# Patient Record
Sex: Female | Born: 1935 | Race: White | Hispanic: No | Smoking: Never smoker
Health system: Southern US, Community
[De-identification: ages and names within clinical notes are randomized; demographics above are authoritative.]

## PROBLEM LIST (undated history)

## (undated) DIAGNOSIS — I10 Essential (primary) hypertension: Secondary | ICD-10-CM

## (undated) DIAGNOSIS — E785 Hyperlipidemia, unspecified: Secondary | ICD-10-CM

## (undated) DIAGNOSIS — I2510 Atherosclerotic heart disease of native coronary artery without angina pectoris: Secondary | ICD-10-CM

## (undated) DIAGNOSIS — E119 Type 2 diabetes mellitus without complications: Secondary | ICD-10-CM

## (undated) DIAGNOSIS — E039 Hypothyroidism, unspecified: Secondary | ICD-10-CM

## (undated) DIAGNOSIS — Z853 Personal history of malignant neoplasm of breast: Secondary | ICD-10-CM

## (undated) DIAGNOSIS — L0390 Cellulitis, unspecified: Secondary | ICD-10-CM

## (undated) DIAGNOSIS — C55 Malignant neoplasm of uterus, part unspecified: Secondary | ICD-10-CM

## (undated) HISTORY — DX: Malignant neoplasm of uterus, part unspecified: C55

## (undated) HISTORY — DX: Essential (primary) hypertension: I10

## (undated) HISTORY — DX: Hyperlipidemia, unspecified: E78.5

## (undated) HISTORY — PX: APPENDECTOMY: SHX54

## (undated) HISTORY — DX: Atherosclerotic heart disease of native coronary artery without angina pectoris: I25.10

## (undated) HISTORY — DX: Cellulitis, unspecified: L03.90

## (undated) HISTORY — PX: CARDIAC CATHETERIZATION: SHX172

## (undated) HISTORY — DX: Personal history of malignant neoplasm of breast: Z85.3

---

## 1977-09-27 DIAGNOSIS — Z853 Personal history of malignant neoplasm of breast: Secondary | ICD-10-CM

## 1977-09-27 HISTORY — PX: MASTECTOMY: SHX3

## 1977-09-27 HISTORY — DX: Personal history of malignant neoplasm of breast: Z85.3

## 1989-09-27 DIAGNOSIS — C55 Malignant neoplasm of uterus, part unspecified: Secondary | ICD-10-CM

## 1989-09-27 HISTORY — DX: Malignant neoplasm of uterus, part unspecified: C55

## 1991-09-28 HISTORY — PX: TOTAL ABDOMINAL HYSTERECTOMY: SHX209

## 2006-09-27 DIAGNOSIS — I2510 Atherosclerotic heart disease of native coronary artery without angina pectoris: Secondary | ICD-10-CM

## 2006-09-27 HISTORY — PX: CORONARY ARTERY BYPASS GRAFT: SHX141

## 2006-09-27 HISTORY — DX: Atherosclerotic heart disease of native coronary artery without angina pectoris: I25.10

## 2006-12-08 ENCOUNTER — Encounter: Payer: Self-pay | Admitting: Emergency Medicine

## 2006-12-09 ENCOUNTER — Encounter (INDEPENDENT_AMBULATORY_CARE_PROVIDER_SITE_OTHER): Payer: Self-pay | Admitting: *Deleted

## 2006-12-09 ENCOUNTER — Inpatient Hospital Stay (HOSPITAL_COMMUNITY): Admission: EM | Admit: 2006-12-09 | Discharge: 2006-12-27 | Payer: Self-pay | Admitting: Cardiology

## 2006-12-09 ENCOUNTER — Ambulatory Visit: Payer: Self-pay | Admitting: Cardiothoracic Surgery

## 2006-12-09 ENCOUNTER — Encounter: Payer: Self-pay | Admitting: Vascular Surgery

## 2006-12-09 ENCOUNTER — Encounter: Payer: Self-pay | Admitting: Cardiology

## 2006-12-09 ENCOUNTER — Ambulatory Visit: Payer: Self-pay | Admitting: Emergency Medicine

## 2006-12-10 ENCOUNTER — Ambulatory Visit: Payer: Self-pay | Admitting: Infectious Diseases

## 2007-01-17 ENCOUNTER — Ambulatory Visit: Payer: Self-pay | Admitting: Surgery

## 2007-01-19 ENCOUNTER — Encounter (HOSPITAL_COMMUNITY): Admission: RE | Admit: 2007-01-19 | Discharge: 2007-04-19 | Payer: Self-pay | Admitting: Cardiology

## 2007-01-24 ENCOUNTER — Ambulatory Visit: Payer: Self-pay | Admitting: Surgery

## 2007-04-20 ENCOUNTER — Encounter (HOSPITAL_COMMUNITY): Admission: RE | Admit: 2007-04-20 | Discharge: 2007-05-08 | Payer: Self-pay | Admitting: Cardiology

## 2007-05-09 ENCOUNTER — Encounter (HOSPITAL_COMMUNITY): Admission: RE | Admit: 2007-05-09 | Discharge: 2007-08-07 | Payer: Self-pay | Admitting: Cardiology

## 2007-08-28 ENCOUNTER — Encounter (HOSPITAL_COMMUNITY): Admission: RE | Admit: 2007-08-28 | Discharge: 2007-09-26 | Payer: Self-pay | Admitting: Cardiology

## 2008-05-02 ENCOUNTER — Inpatient Hospital Stay (HOSPITAL_COMMUNITY): Admission: EM | Admit: 2008-05-02 | Discharge: 2008-05-04 | Payer: Self-pay | Admitting: Emergency Medicine

## 2008-05-04 ENCOUNTER — Ambulatory Visit: Payer: Self-pay | Admitting: Internal Medicine

## 2008-05-04 ENCOUNTER — Encounter: Payer: Self-pay | Admitting: Internal Medicine

## 2008-05-04 LAB — HM COLONOSCOPY

## 2008-05-06 ENCOUNTER — Encounter: Payer: Self-pay | Admitting: Internal Medicine

## 2008-05-17 ENCOUNTER — Encounter: Payer: Self-pay | Admitting: Internal Medicine

## 2009-04-10 ENCOUNTER — Telehealth: Payer: Self-pay | Admitting: Internal Medicine

## 2009-05-28 HISTORY — PX: US ECHOCARDIOGRAPHY: HXRAD669

## 2009-07-28 HISTORY — PX: OTHER SURGICAL HISTORY: SHX169

## 2009-08-27 HISTORY — PX: TOTAL HIP ARTHROPLASTY: SHX124

## 2009-09-03 ENCOUNTER — Inpatient Hospital Stay (HOSPITAL_COMMUNITY): Admission: RE | Admit: 2009-09-03 | Discharge: 2009-09-08 | Payer: Self-pay | Admitting: Orthopedic Surgery

## 2010-09-17 ENCOUNTER — Ambulatory Visit: Payer: Self-pay | Admitting: Cardiology

## 2010-10-07 ENCOUNTER — Encounter: Payer: Self-pay | Admitting: Family Medicine

## 2010-10-18 ENCOUNTER — Encounter: Payer: Self-pay | Admitting: Family Medicine

## 2010-11-12 NOTE — Letter (Signed)
Summary: Historic Patient File  Historic Patient File   Imported By: Kassie Mends 11/03/2010 10:07:40  _____________________________________________________________________  External Attachment:    Type:   Image     Comment:   External Document

## 2010-12-05 ENCOUNTER — Encounter: Payer: Self-pay | Admitting: Family Medicine

## 2010-12-29 LAB — COMPREHENSIVE METABOLIC PANEL
ALT: 19 U/L (ref 0–35)
AST: 18 U/L (ref 0–37)
CO2: 22 mEq/L (ref 19–32)
Calcium: 9.5 mg/dL (ref 8.4–10.5)
Creatinine, Ser: 0.88 mg/dL (ref 0.4–1.2)
GFR calc Af Amer: >60 mL/min (ref >60)
GFR calc non Af Amer: >60 mL/min (ref >60)
Sodium: 136 mEq/L (ref 135–145)
Total Protein: 7.1 g/dL (ref 6.0–8.3)

## 2010-12-29 LAB — TYPE AND SCREEN: ABO/RH(D): O POS

## 2010-12-29 LAB — BASIC METABOLIC PANEL
BUN: 15 mg/dL (ref 6–23)
BUN: 25 mg/dL — ABNORMAL HIGH (ref 6–23)
CO2: 27 mEq/L (ref 19–32)
Calcium: 8.9 mg/dL (ref 8.4–10.5)
Chloride: 104 mEq/L (ref 96–112)
Chloride: 104 mEq/L (ref 96–112)
Creatinine, Ser: 0.91 mg/dL (ref 0.4–1.2)
GFR calc Af Amer: >60 mL/min (ref >60)
GFR calc non Af Amer: >60 mL/min (ref >60)
Glucose, Bld: 135 mg/dL — ABNORMAL HIGH (ref 70–99)
Potassium: 3.6 mEq/L (ref 3.5–5.1)
Potassium: 3.8 mEq/L (ref 3.5–5.1)
Sodium: 134 mEq/L — ABNORMAL LOW (ref 135–145)
Sodium: 136 mEq/L (ref 135–145)
Sodium: 136 mEq/L (ref 135–145)

## 2010-12-29 LAB — GLUCOSE, CAPILLARY
Glucose-Capillary: 107 mg/dL — ABNORMAL HIGH (ref 70–99)
Glucose-Capillary: 113 mg/dL — ABNORMAL HIGH (ref 70–99)
Glucose-Capillary: 119 mg/dL — ABNORMAL HIGH (ref 70–99)
Glucose-Capillary: 128 mg/dL — ABNORMAL HIGH (ref 70–99)
Glucose-Capillary: 130 mg/dL — ABNORMAL HIGH (ref 70–99)
Glucose-Capillary: 142 mg/dL — ABNORMAL HIGH (ref 70–99)
Glucose-Capillary: 142 mg/dL — ABNORMAL HIGH (ref 70–99)
Glucose-Capillary: 153 mg/dL — ABNORMAL HIGH (ref 70–99)
Glucose-Capillary: 154 mg/dL — ABNORMAL HIGH (ref 70–99)
Glucose-Capillary: 164 mg/dL — ABNORMAL HIGH (ref 70–99)
Glucose-Capillary: 170 mg/dL — ABNORMAL HIGH (ref 70–99)
Glucose-Capillary: 186 mg/dL — ABNORMAL HIGH (ref 70–99)
Glucose-Capillary: 239 mg/dL — ABNORMAL HIGH (ref 70–99)
Glucose-Capillary: 62 mg/dL — ABNORMAL LOW (ref 70–99)
Glucose-Capillary: 70 mg/dL (ref 70–99)
Glucose-Capillary: 80 mg/dL (ref 70–99)
Glucose-Capillary: 95 mg/dL (ref 70–99)

## 2010-12-29 LAB — URINE MICROSCOPIC-ADD ON

## 2010-12-29 LAB — PROTIME-INR
INR: 0.98 (ref 0.00–1.49)
INR: 1.62 — ABNORMAL HIGH (ref 0.00–1.49)
INR: 1.84 — ABNORMAL HIGH (ref 0.00–1.49)
Prothrombin Time: 12.9 seconds (ref 11.6–15.2)
Prothrombin Time: 16.9 seconds — ABNORMAL HIGH (ref 11.6–15.2)
Prothrombin Time: 19.1 seconds — ABNORMAL HIGH (ref 11.6–15.2)
Prothrombin Time: 22.2 seconds — ABNORMAL HIGH (ref 11.6–15.2)

## 2010-12-29 LAB — CBC
HCT: 23.6 % — ABNORMAL LOW (ref 36.0–46.0)
HCT: 26.9 % — ABNORMAL LOW (ref 36.0–46.0)
MCV: 92.4 fL (ref 78.0–100.0)
MCV: 92.6 fL (ref 78.0–100.0)
MCV: 93.4 fL (ref 78.0–100.0)
Platelets: 154 10*3/uL (ref 150–400)
Platelets: 187 10*3/uL (ref 150–400)
RBC: 2.52 MIL/uL — ABNORMAL LOW (ref 3.87–5.11)
RBC: 2.91 MIL/uL — ABNORMAL LOW (ref 3.87–5.11)
RBC: 3.65 MIL/uL — ABNORMAL LOW (ref 3.87–5.11)
RDW: 16.4 % — ABNORMAL HIGH (ref 11.5–15.5)
WBC: 6.5 10*3/uL (ref 4.0–10.5)
WBC: 6.9 10*3/uL (ref 4.0–10.5)
WBC: 7 10*3/uL (ref 4.0–10.5)

## 2010-12-29 LAB — URINALYSIS, ROUTINE W REFLEX MICROSCOPIC
Bilirubin Urine: NEGATIVE
Bilirubin Urine: NEGATIVE
Glucose, UA: NEGATIVE mg/dL
Glucose, UA: NEGATIVE mg/dL
Ketones, ur: NEGATIVE mg/dL
Protein, ur: NEGATIVE mg/dL
Urobilinogen, UA: 0.2 mg/dL (ref 0.0–1.0)
pH: 5 (ref 5.0–8.0)

## 2010-12-29 LAB — PREPARE RBC (CROSSMATCH)

## 2010-12-29 LAB — URINE CULTURE
Colony Count: NO GROWTH
Culture: NO GROWTH

## 2011-01-26 ENCOUNTER — Other Ambulatory Visit: Payer: Self-pay | Admitting: Cardiology

## 2011-01-26 ENCOUNTER — Encounter: Payer: Self-pay | Admitting: Family Medicine

## 2011-01-26 ENCOUNTER — Ambulatory Visit (INDEPENDENT_AMBULATORY_CARE_PROVIDER_SITE_OTHER): Payer: Medicare Other | Admitting: Family Medicine

## 2011-01-26 DIAGNOSIS — I2510 Atherosclerotic heart disease of native coronary artery without angina pectoris: Secondary | ICD-10-CM

## 2011-01-26 DIAGNOSIS — E7800 Pure hypercholesterolemia, unspecified: Secondary | ICD-10-CM

## 2011-01-26 DIAGNOSIS — I10 Essential (primary) hypertension: Secondary | ICD-10-CM

## 2011-01-26 DIAGNOSIS — E1149 Type 2 diabetes mellitus with other diabetic neurological complication: Secondary | ICD-10-CM | POA: Insufficient documentation

## 2011-01-26 DIAGNOSIS — C50919 Malignant neoplasm of unspecified site of unspecified female breast: Secondary | ICD-10-CM

## 2011-01-26 DIAGNOSIS — J309 Allergic rhinitis, unspecified: Secondary | ICD-10-CM

## 2011-01-26 DIAGNOSIS — E119 Type 2 diabetes mellitus without complications: Secondary | ICD-10-CM

## 2011-01-26 DIAGNOSIS — G629 Polyneuropathy, unspecified: Secondary | ICD-10-CM

## 2011-01-26 DIAGNOSIS — G609 Hereditary and idiopathic neuropathy, unspecified: Secondary | ICD-10-CM

## 2011-01-26 DIAGNOSIS — D509 Iron deficiency anemia, unspecified: Secondary | ICD-10-CM

## 2011-01-26 DIAGNOSIS — E039 Hypothyroidism, unspecified: Secondary | ICD-10-CM

## 2011-01-26 DIAGNOSIS — C55 Malignant neoplasm of uterus, part unspecified: Secondary | ICD-10-CM

## 2011-01-26 LAB — COMPREHENSIVE METABOLIC PANEL
ALT: 15 U/L (ref 0–35)
AST: 18 U/L (ref 0–37)
CO2: 27 mEq/L (ref 19–32)
Calcium: 9.1 mg/dL (ref 8.4–10.5)
Chloride: 106 mEq/L (ref 96–112)
GFR: 58.69 mL/min — ABNORMAL LOW (ref >60.00)
Sodium: 141 mEq/L (ref 135–145)
Total Bilirubin: 0.4 mg/dL (ref 0.3–1.2)
Total Protein: 6.1 g/dL (ref 6.0–8.3)

## 2011-01-26 LAB — CBC WITH DIFFERENTIAL/PLATELET
Basophils Absolute: 0.1 10*3/uL (ref 0.0–0.1)
Eosinophils Absolute: 0.7 10*3/uL (ref 0.0–0.7)
HCT: 30.5 % — ABNORMAL LOW (ref 36.0–46.0)
Lymphs Abs: 1.4 10*3/uL (ref 0.7–4.0)
MCHC: 34 g/dL (ref 30.0–36.0)
Monocytes Relative: 4.9 % (ref 3.0–12.0)
Platelets: 193 10*3/uL (ref 150.0–400.0)
RDW: 14.4 % (ref 11.5–14.6)

## 2011-01-26 LAB — LIPID PANEL
Total CHOL/HDL Ratio: 6
VLDL: 83 mg/dL — ABNORMAL HIGH (ref 0.0–40.0)

## 2011-01-26 LAB — HEMOGLOBIN A1C: Hgb A1c MFr Bld: 6.5 % (ref 4.6–6.5)

## 2011-01-26 LAB — VITAMIN B12: Vitamin B-12: 1517 pg/mL — ABNORMAL HIGH (ref 211–911)

## 2011-01-26 LAB — LDL CHOLESTEROL, DIRECT: Direct LDL: 143.6 mg/dL

## 2011-01-26 NOTE — Assessment & Plan Note (Signed)
On glyburide..due to for reeval. The patient is advised to follow a low fat, low cholesterol diet, attempt to lose weight and improve dietary compliance.

## 2011-01-26 NOTE — Assessment & Plan Note (Signed)
Well controlled. Continue current medication.  

## 2011-01-26 NOTE — Progress Notes (Signed)
Addended by: Liane Comber on: 01/26/2011 12:39 PM   Modules accepted: Orders

## 2011-01-26 NOTE — Progress Notes (Signed)
Subjective:    Patient ID: [name], female    DOB: [birth year], 77 y.o.   MRN: [id number]  HPI  77 year old female here to establish.  Iron Def. Anemia: 2 years ago diagnosed...been on iron since. No GI source on colonoscopy.  Diabetes:Dx 10 years ago. On glyburide. Checks blood sugars a few times a weeks. FBS: 105 A1C: not sure but well controlled per pt. Last check in 07/2011  High cholesterol:  On crestor one tab a week. Higher dose gave her SE.  CAD... S/P Quad bypass .Marland Kitchen..followed by Dr. Patty Sermons. 08/2010...sees him every six months.  S/P breast cancer 1979...right mastectomy and chemo  S/P uterine cancer 1993.Marland KitchenMarland KitchenTAH  Hypothroid...well controlled last check.  HTN: Not followed at home....white coat HTN per pt...usually better by time she leaves.  In last 5 months she has been having  B foot pain... using tramadol prn. Feels buring in feet, needles sticking pain.  Keeping her up at night.  Feels somewhat better when up and walking around.  No swelling , no redness. No injury. Has history plantar fasciitis, ingrown toenail...resolved  Has gained 10 lbs because she is unable to walk. Getting diabetic shoes next week.   Review of Systems  Constitutional: Negative for fever and fatigue.  HENT: Negative for ear pain.   Eyes: Negative for pain.  Respiratory: Negative for chest tightness and shortness of breath.   Cardiovascular: Negative for chest pain, palpitations and leg swelling.  Gastrointestinal: Negative for abdominal pain.  Genitourinary: Negative for dysuria.       Objective:   Physical Exam  Constitutional: Vital signs are normal. She appears well-developed and well-nourished. She is cooperative.  Non-toxic appearance. She does not appear ill. No distress.  HENT:  Head: Normocephalic.  Right Ear: Hearing, tympanic membrane, external ear and ear canal normal. Tympanic membrane is not erythematous, not retracted and not bulging.  Left Ear:  Hearing, tympanic membrane, external ear and ear canal normal. Tympanic membrane is not erythematous, not retracted and not bulging.  Nose: No mucosal edema or rhinorrhea. Right sinus exhibits no maxillary sinus tenderness and no frontal sinus tenderness. Left sinus exhibits no maxillary sinus tenderness and no frontal sinus tenderness.  Mouth/Throat: Uvula is midline, oropharynx is clear and moist and mucous membranes are normal.  Eyes: Conjunctivae, EOM and lids are normal. Pupils are equal, round, and reactive to light. No foreign bodies found.  Neck: Trachea normal and normal range of motion. Neck supple. Carotid bruit is not present. No mass and no thyromegaly present.  Cardiovascular: Normal rate, regular rhythm, S1 normal, S2 normal, normal heart sounds, intact distal pulses and normal pulses.  Exam reveals no gallop and no friction rub.   No murmur heard. Pulmonary/Chest: Effort normal and breath sounds normal. Not tachypneic. No respiratory distress. She has no decreased breath sounds. She has no wheezes. She has no rhonchi. She has no rales.  Abdominal: Soft. Normal appearance and bowel sounds are normal. There is no tenderness.  Musculoskeletal:       Thoracic back: Normal.       Lumbar back: Normal.  Neurological: She is alert. She has normal strength and normal reflexes. She displays no atrophy. No cranial nerve deficit or sensory deficit. She exhibits normal muscle tone. She displays a negative Romberg sign.       Slight decrease in monofilament testing in distal toes  Skin: Skin is warm, dry and intact. No rash noted.  Psychiatric: Her speech is normal and behavior is  normal. Judgment and thought content normal. Her mood appears not anxious. Cognition and memory are normal. She does not exhibit a depressed mood.          Assessment & Plan:

## 2011-01-26 NOTE — Telephone Encounter (Signed)
Fax received from pharmacy. Refill completed. Jodette Ondine Gemme RN  

## 2011-01-26 NOTE — Patient Instructions (Signed)
It was nice to meet you today. We will call you with the lab results.

## 2011-01-26 NOTE — Assessment & Plan Note (Signed)
New. Most consistent with DM neuropahty... No clear suggestion of spinal central source. Will eval B12 and labs. If nml labs...consider starting gabapentin..Discussed with the patient and all questioned fully answered regarding this med and SE. She is open to its use if needed.

## 2011-01-26 NOTE — Assessment & Plan Note (Signed)
Per pt well controlled last check in 07/2010

## 2011-01-26 NOTE — Assessment & Plan Note (Signed)
ON once a week crestor..due to for reeval.

## 2011-01-27 ENCOUNTER — Telehealth: Payer: Self-pay | Admitting: Family Medicine

## 2011-01-27 DIAGNOSIS — E7800 Pure hypercholesterolemia, unspecified: Secondary | ICD-10-CM

## 2011-01-27 NOTE — Telephone Encounter (Signed)
Notify pt labs reviewed.  DM is well controlled. Stable anemia.  Nml liver and kidney function. Nml electrolyte such as sodiuma nd potassium  Cholesterol remains poorly controlled on once weekly crestor... Increase to 3 times a week if able...will recheck labs in 3 months prior to next appt.  B12 in nml range... Diabetes is likely cause of her foot burning.  Is she open to starting gabapentin at bedtime for symptoms and titrating up? Let me know.

## 2011-02-01 ENCOUNTER — Encounter: Payer: Self-pay | Admitting: *Deleted

## 2011-02-01 NOTE — Telephone Encounter (Signed)
Letter mailed to patient with information and asked patient to call back about the new medicaton

## 2011-02-09 NOTE — H&P (Signed)
NAME:  [name], WARTH NO.:  [id number]   MEDICAL RECORD NO.:  [id number]          PATIENT TYPE:  EMS   LOCATION:  MAJO                         FACILITY:  [facility]   PHYSICIAN:  Eduard Clos, MDDATE OF BIRTH:  [birth year]   DATE OF ADMISSION:  05/01/2008  DATE OF DISCHARGE:                              HISTORY & PHYSICAL   CHIEF COMPLAINT:  The patient was found in altered mental status by her  family.   HISTORY OF PRESENT ILLNESS:  A 77 year old female with history of CAD  status post CABG, hypertension, diabetes mellitus type 2,  hypothyroidism, who was brought into the ER after the patient's family  found her to be in altered mental status with incoherence.  The EMS was  called and the patient's blood sugar was found to be around 39.  Subsequently D50 was given.  At this moment the patient's blood sugar is  136.  The patient is alert, awake, and oriented to time, place, and  person.  The patient recalled that she had sat on a chair and was  feeling abnormal.  She went to sleep after which she was woken up by her  family member.  She does not have any chest pain prior or post to the  incident.  She denies any weakness of limbs, nausea, vomiting, abdominal  pain, dysuria, or discharge.  The patient does have some chills and  temperature measured in the ER was 94 degrees and Bear hugger has been  ordered.   PAST MEDICAL HISTORY:  1. CAD status post CABG.  2. Diabetes mellitus type 2.  3. Hypothyroidism.  4. History of depression.   PAST SURGICAL HISTORY:  1. CABG last year.  2. Appendectomy.  3. History of mastectomy for breast cancer.  4. History of hysterectomy for uterine cancer.   ADMISSION MEDICATIONS:  1. Actos 45 mg p.o. daily.  2. Glyburide 5 mg two tablets daily.  3. Paroxetine 20 mg p.o. daily.  4. Toprol XL 25 mg twice daily.  5. Triamterene hydrochlorothiazide 37.5/25 mg p.o. daily.  6. Levothyroxine 0.05 mg p.o. daily.  7. Aspirin 81  mg p.o. daily.   ALLERGIES:  No known drug allergies.   FAMILY HISTORY:  Nothing contributory.   SOCIAL HISTORY:  The patient lives alone.  Denies smoking cigarettes,  drinking alcohol, or using any illegal drugs.   REVIEW OF SYSTEMS:  As in history of present illness.  Nothing else  significant.   PHYSICAL EXAMINATION:  GENERAL:  The patient examined at bedside, not in  acute distress.  VITAL SIGNS:  Blood pressure is 126/30, pulse 58 per minute, temperature  94 degrees Fahrenheit, respirations 20 per minute, O2 saturations 96%.  HEENT:  Anicteric, no pallor.  PERRLA positive.  CHEST:  Bilateral air entry present.  No rhonchi and no crepitation.  HEART:  S1 and S2 heard.  ABDOMEN:  Soft and nontender.  Bowel sounds heard.  No guarding and no  rigidity.  NEUROLOGY:  The patient is alert, awake, and oriented to time, place,  and person.  She moves upper and lower extremities 5/5.  EXTREMITIES:  Peripheral pulses felt.  No edema.   LABORATORY DATA:  CBC; WBC 7.9, hemoglobin 9.5, hematocrit 28, platelets  201, neutrophils 84%. Basic metabolic panel; sodium 139, potassium 3.4,  chloride 107, glucose 34, BUN 29, creatinine 1.2, CK-MB 1.2, troponin I  less than 0.05.  Urine is cloudy with positive nitrites and leukocytes.  WBC 3-6, bacteria many.   ASSESSMENT:  1. Altered mental status probably secondary to hypoglycemia.  2. Urinary tract infection.  3. Hypothermia.  4. History of coronary artery disease status post coronary artery      bypass graft.  5. History of hypothyroidism.  6. History of hypertension.   PLAN:  Admit the patient to telemetry.  We will follow CBGs closely.  Hold glyburide and Actos for now.  We will follow cardiac enzymes and  obtain blood cultures, urine cultures.  We will place the patient on  empiric antibiotics for possible UTI and further recommendations as the  patient's condition evolves.      Eduard Clos, MD  Electronically  Signed     ANK/MEDQ  D:  05/02/2008  T:  05/02/2008  Job:  [id number]

## 2011-02-09 NOTE — Discharge Summary (Signed)
NAMERAVIN, BENDALL NO.:  [id number]   MEDICAL RECORD NO.:  [id number]          PATIENT TYPE:  INP   LOCATION:  5508                         FACILITY:  [facility]   PHYSICIAN:  Elliot Cousin, M.D.    DATE OF BIRTH:  07/04/1933   DATE OF ADMISSION:  05/01/2008  DATE OF DISCHARGE:  05/04/2008                               DISCHARGE SUMMARY   <   DISCHARGE DIAGNOSES:  1. Altered mental status secondary to hypoglycemia.  2. Hypoglycemia secondary to glyburide, concomitant with Actos.  3. Iron-deficiency anemia; transfused 2 units of packed red blood      cells.  4. Colon polyp and sigmoid diverticulosis, per colonoscopy on May 04, 2008.  5. Erosive gastritis, per esophagogastroduodenoscopy on May 04, 2008.  6. Pyuria without dysuria.  7. Hypokalemia.  8. Hypothermia, possibly due to a technical error.   DISCHARGE MEDICATIONS:  1. Protonix 40 mg daily.  2. Ferrous sulfate 325 mg b.i.d.  3. Multivitamin once daily.  4. Do not take glyburide.  5. Actos 45 mg daily.  6. Triamterene/hydrochlorothiazide 37.5/25 mg daily.  7. Levothyroxine 0.05 mg daily.  8. Toprol-XL 25 mg b.i.d.  9. Aspirin 81 mg daily.  10.Niaspan 500 mg daily.  11.Paroxetine 20 mg daily.   DISCHARGE DISPOSITION:  The patient is being discharged home in improved  and stable condition.  She was advised to follow up with her primary  care physician, Dr. Smith Mince in 1-2 weeks and with gastroenterologist,  Dr. Leone Payor as needed.   CONSULTATIONS:  1. Barbette Hair. Arlyce Dice, MD, [facility]  2. Iva Boop, MD, [facility]   PROCEDURES PERFORMED:  1. Colonoscopy on May 04, 2008.  Performed by Dr. Stan Head.  The      results revealed one diminutive polyp, removed, mild sigmoid      diverticulosis.  2. Esophagogastroduodenoscopy on May 04, 2008, by Dr. Stan Head.      The results revealed erosive gastritis, distal stomach, tested for      H. pylori, 5-cm hiatal hernia, otherwise  normal      esophagogastroduodenoscopy.  3. Status post 2 units of packed red blood cell transfusion.  4. CT scan of the head on May 02, 2008.  The results revealed no      acute intracranial abnormality.   HISTORY OF PRESENT ILLNESS:  The patient is a 77 year old woman with a  past medical history significant for coronary artery disease, type 2  diabetes mellitus, and hypothyroidism.  She was brought to the emergency  department by her family when she was found to be incoherent and  lethargic at home.  When the EMS arrived , the patient's blood sugar was  found to be 39.  She was treated with D50 and subsequently brought to  the emergency department.  When the patient was evaluated in the  emergency department, her glucose was 34 after it had been 136 following  the dextrose given by EMS.  The patient was given more dextrose in the  emergency department.  A CT scan of the head  was ordered, and it  revealed no acute intracranial abnormalities.  Also, of note, the  patient's temperature was recorded at 94 degrees Fahrenheit in the  emergency department.  The patient was admitted for further evaluation  and management.  For additional details, please see the dictated history  and physical.   HOSPITAL COURSE:  1. Type 2 diabetes mellitus with symptomatic hypoglycemia.  As stated      above, the patient was given multiple doses of D50.  Her capillary      blood glucose was monitored frequently.  The following day, she was      restarted on IV fluids with D5 normal saline with potassium      chloride added.  The glyburide and Actos were withheld during the      hospitalization.  Over the course of the 2-day hospitalization, the      patient's blood glucose improved.  Prior to hospital discharge, her      blood glucose was 174.  The patient was advised to discontinue the      glyburide until she is reevaluated by her primary care physician in      1-2 weeks.  She was advised to restart  the Actos, but to not take      the Actos if her blood glucose was less than 110.  She was also      strongly advised not to skip meals.  Of note, her hemoglobin A1c      was 4.8 during the hospitalization.  2. Hypothermia.  As stated above, the patient's temperature was 94      degrees in the emergency department.  The patient was treated with      a [employer].  Although the patient did not appear to be septic,      blood cultures were ordered.  In addition, an urinalysis and chest      x-ray were ordered.  Her blood cultures have remained negative so      far.  Her urinalysis revealed positive nitrites and small      leukocytes.  She was therefore started on Cipro intravenously.  The      urine culture eventually grew out greater than 100,000 colonies of      E. coli.  The patient had no complaints of dysuria prior to this      hospitalization or during the hospitalization.  She was treated      however with 2-1/2 days of intravenous Cipro.  The patient remained      afebrile during the entire hospitalization.  Her white blood cell      count was within normal limits as well.  It is unclear if the      original temperature recorded was correct; it may have been a      technical error.  The patient's temperature remained well within      normal limits during the entire hospitalization.  3. Microcytic iron-deficiency anemia.  At the time of the initial      hospital assessment, the patient's hemoglobin was 8.8.  Following      gentle volume repletion, her hemoglobin fell to a nadir of 7.9.      She was transfused 2 units of packed red blood cells.  Following      the transfusion, her hemoglobin improved to 10.5.  Her stools were      tested for microscopic blood and were negative.  Because of the  severe anemia, gastroenterologist, Dr. Arlyce Dice was consulted for      further evaluation.  Dr. Arlyce Dice evaluated the patient along with      his PA, Ms. Sanjuana Letters.  He recommended further  investigation with an      EGD and colonoscopy.  Dr. Leone Payor performed the EGD and colonoscopy      today.  The results are above.  Given the findings of the      gastritis, the patient was started on Protonix once daily.  Upon      discharge, the patient was started on ferrous sulfate 325 mg b.i.d.   Prior to the transfusions, iron studies were ordered.  The results were  as follows:  Ferritin 24, vitamin B12 510, total iron 20, TIBC 375,  percent saturation 5, and folic acid 12.8.  The patient was advised to  avoid all NSAID products with the exception of aspirin 81 mg daily.  1. Hypothyroidism.  The patient was maintained on replacement therapy      with levothyroxine.  Her TSH was      within normal limits at 0.812.  2. Hypokalemia.  The patient's serum potassium fell to a nadir of 3.2.      She was repleted with potassium chloride during the      hospitalization.  Prior to hospital discharge, her serum potassium      normalized to 4.1.      Elliot Cousin, M.D.  Electronically Signed     DF/MEDQ  D:  05/04/2008  T:  05/05/2008  Job:  [id number]   cc:   Talmadge Coventry, M.D.  Iva Boop, MD,FACG

## 2011-02-11 ENCOUNTER — Other Ambulatory Visit: Payer: Self-pay | Admitting: Cardiology

## 2011-02-12 NOTE — H&P (Signed)
NAME:  AYRA, HODGDON NO.:  [id number]   MEDICAL RECORD NO.:  [id number]          PATIENT TYPE:  EMS   LOCATION:  ED                           FACILITY:  [facility]   PHYSICIAN:  Ulyses Amor, MD DATE OF BIRTH:  12-Mar-1933   DATE OF ADMISSION:  12/08/2006  DATE OF DISCHARGE:                              HISTORY & PHYSICAL   [name] is a 77 year old white woman who is admitted to [facility] for further evaluation of chest pain.   The patient, who has no past history of cardiac disease, presented to  the emergency department with a history of chest pain, which began this  morning.  It has continued in an intermittent fashion throughout the  day.  The chest pain is described as an upper substernal pressure.  It  does not radiate.  It has been associated with dyspnea but no  diaphoresis or nausea.  There were no exacerbating or ameliorating  factors.  It appears not to be related to position, activity, meals, or  respirations.  She notes that it improved while she was given  nitroglycerin.  She is not experiencing any chest pain at this time.   As noted, the patient has no past history of cardiac disease, including  no history of chest pain, myocardial infarction, coronary artery  disease, congestive heart failure, or arrhythmia.  She has a number of  risk factors for coronary artery disease, including hypertension,  dyslipidemia, diabetes mellitus, and a family history of coronary  disease.  There is no history of smoking.   The patient has a history of breast cancer and underwent mastectomy.  Her past medical history is otherwise unremarkable.   MEDICATIONS:  Actos, aspirin, Glyburide, and  triamterene/hydrochlorothiazide.   ALLERGIES:  MORPHINE.   OPERATIONS:  Appendectomy.   SOCIAL HISTORY:  The patient is a widow.  She lives alone.  She neither  smokes cigarettes nor drinks alcohol.   REVIEW OF SYSTEMS:  No problems related to her  head, eyes, ears, nose,  mouth, throat, lungs, gastrointestinal system, genitourinary system, or  extremities.  There is no history of neurologic or psychiatric disorder.  There is no history of fever, chills, or weight loss.   PHYSICAL EXAMINATION:  VITAL SIGNS:  Blood pressure 138/77.  Pulse 108  and regular.  Respirations 20.  Temperature 98.2.  GENERAL:  The patient was an elderly white female in no discomfort.  She  was alert, oriented, appropriate, and responsive.  HEENT:  Normal.  NECK:  Without thyromegaly or adenopathy.  Carotid pulses were palpable  bilaterally and without bruits.  CARDIAC:  A normal S1 and S2.  There was no S3, S4, murmur, rub, or  click.  Cardiac rhythm was regular.  No chest wall tenderness was noted.  LUNGS:  Clear.  ABDOMEN:  Soft and nontender.  There was no mass, hepatosplenomegaly,  bruits, distention, rebound, guarding, or rigidity.  Bowel sounds were  normal.  BREASTS/PELVIC/RECTAL:  Not performed, as they were not pertinent to the  reason for acute care hospitalization.  EXTREMITIES:  Without edema, deviation, or deformity.  Radial and  dorsalis pedis pulses were palpable bilaterally.  NEUROLOGIC:  Brief  screening neurologic survey was unremarkable.   The electrocardiogram revealed sinus tachycardia.  There were  nonspecific ST-T wave changes.  These changes were not specific for  ischemia or infarction.   The chest radiograph, according to the radiologist, demonstrated  cardiomegaly without evidence of acute cardiopulmonary disease.  A  hiatal hernia was noted.   The initial set of cardiac markers revealed a myoglobin of greater than  500, CK-MB 11.8, and troponin 0.43.  White count was 18.4 with a  hemoglobin of 12.4 and a hematocrit of 36.  BUN was 26, creatinine 1.1,  and potassium 3.5.  The remaining studies were pending at the time of  this dictation.   IMPRESSION:  1. Unstable angina:  Rule out acute non-ST segment elevation and       myocardial infarction.  Troponin 0.43.  2. Hypertension.  3. Dyslipidemia.  4. Diabetes mellitus.  5. Status post breast cancer.   PLAN:  1. Step-down cardiac unit at [facility]  2. Serial cardiac enzymes.  3. Aspirin.  4. Intravenous heparin.  5. Intravenous nitroglycerin.  6. Metoprolol.  7. Further measures per Dr. Patty Sermons.      Ulyses Amor, MD  Electronically Signed     MSC/MEDQ  D:  12/08/2006  T:  12/08/2006  Job:  [id number]   cc:   Cassell Clement, M.D.  Fax: [phone]

## 2011-02-12 NOTE — Discharge Summary (Signed)
[name], [name] NO.:  [id number]   MEDICAL RECORD NO.:  [id number]          PATIENT TYPE:  INP   LOCATION:  2031                         FACILITY:  [facility]   PHYSICIAN:  Evelene Croon, M.D.     DATE OF BIRTH:  Jul 23, 1933   DATE OF ADMISSION:  12/08/2006  DATE OF DISCHARGE:  12/27/2006                               DISCHARGE SUMMARY   HISTORY OF PRESENT ILLNESS:  The patient is a 77 year old female who was  admitted on December 08, 2006, with unstable angina to rule a non-ST-  segment elevation MI.  On December 08, 2006, she presented to [facility]  Emergency Room with sharp left-sided chest pain of several episodes that  presented at rest.  Recently over the past approximate 5-6 months she  has been noticing shortness of breath primarily with exertion.  The pain  occurred at rest without any precipitating factors.  She also denied  nausea, vomiting or diaphoresis associated with this.  Her initial CK  was 674 with an MB of 64.7 and serial troponins went from 0.43 to 21.37  to 9.87.  She was felt to require transfer to [facility] for  further evaluation and treatment including cardiac catheterization.   PAST MEDICAL HISTORY:  1. Breast cancer status post right mastectomy in 1979.  2. History of uterine cancer status post hysterectomy in 1993.  3. She also has history of thyroid disease.   MEDICATIONS AT TIME OF ADMISSION:  Included thyroxine 0.5 mg daily,  glyburide 5 mg two tablets daily, Actos 45 mg daily, aspirin one daily  and triamterene/hydrochlorothiazide 37.5/25 one daily.   ALLERGIES:  MORPHINE.   FAMILY HISTORY, SOCIAL HISTORY, REVIEW OF SYMPTOMS AND PHYSICAL  EXAMINATION:  Please see the history and physical done at the time of  admission.   HOSPITAL COURSE:  The patient was transferred from [facility] to [facility].  She was seen in cardiology consultation by Dr. Patty Sermons with the  initial impression of non Q-wave myocardial infarction  as well as poorly  controlled diabetes and probable right arm cellulitis.  She was taken to  the cardiac catheterization lab on December 08, 2006, by Dr. [place] and  findings included severe three-vessel disease including the 80-90% LAD,  100% left circumflex with collaterals and 100% right coronary stenosis  with collaterals.  She was found to have low normal left ventricular  function with an ejection fraction of 50-55%, evidence of inferior  hypokinesis and mild mitral regurgitation.  Due to these findings  cardiac surgical consultation was obtained initially with Dr. Sheliah Plane.  He felt she would require further evaluation and treatment  prior to proceeding with surgery.  Treatment primarily of her right arm  cellulitis but additionally, he felt as though she would require an  echocardiogram.  She was treated initially for the cellulitis with  vancomycin but she then developed a urinary tract infection with E. coli  which was sensitive to Rocephin which was started.  An infectious  disease consultation was obtained with Dr. Roxan Hockey.  He followed the  patient throughout the hospitalization up  to the time of surgery.  She  also developed pneumonia and pulmonary critical care medicine  consultation became involved in her care.  She was treated aggressively  regarding these matters and showed a good and gradual improvement.  Ultimately she was deemed to be acceptable for proceeding with surgery  and this was scheduled and performed on December 22, 2006.   PROCEDURE:  Coronary artery bypass grafting x5 with the following grafts  placed:  1. Left internal mammary artery to the LAD.  2. Saphenous vein graft to the diagonal #1.  3. Sequential saphenous vein graft to obtuse marginal #2 and obtuse      marginal #3.  4. Saphenous vein graft to posterior descending.   Intraoperative findings were remarkable for the obtuse marginal #2 and  #3 to be small and diffusely diseased.    POSTOPERATIVE HOSPITAL COURSE:  The patient has done well overall.  She  maintained stable hemodynamics.  All routine lines, monitors, drainage  devices were discontinued in the standard fashion.  She did have a right-  sided pneumothorax during the postoperative period and this required the  chest tube to be left in for a somewhat increased time, however it was  discontinued and the chest x-ray has been followed closely with only  evidence of a small stable right pneumothorax.  She has also maintained  a stable cardiac rhythm.  She has had no significant cardiac  dysrhythmias or ectopy.  She did have some postoperative nausea that has  resolved with routine measures.  Her incisions are all healing well  without evidence of infection.  Her laboratory values have stabilized.  She does have a moderate postoperative anemia but most recent  hemoglobin/hematocrit dated December 26, 2006, are stable at 9.7 and 28.6  respectively.  Electrolytes, BUN and creatinine are all within normal  limits.  Her blood glucoses have been monitored closely and she is under  adequate glycemic control.  She has responded well to a gentle diuresis  but will require somewhat further as an outpatient.  She has responded  well to aggressive pulmonary toilet and cardiac rehabilitation  management.  She is showing good improvement in all these parameters.  Her overall status is felt to be tentatively stable for discharge in the  morning of December 27, 2006, pending morning round reevaluation.   MEDICATIONS ON DISCHARGE WILL BE AS FOLLOWS:  1. Aspirin 81 mg daily.  2. Toprol XL 25 mg daily.  3. Zocor 20 mg daily.  4. Synthroid 50 mcg daily.  5. Lasix 40 mg daily for 5 days.  6. Potassium chloride 20 mEq daily for 5 days.  7. Glyburide 5 mg two tablets daily.  8. Actos 45 mg daily.  9. Triamterene-hydrochlorothiazide 37.5/25 one daily. 10.Ultram one every 6 hours as needed for pain.   INSTRUCTIONS:  The patient received  written instructions regarding  medications, activity, diet, wound care and followup.  Followup includes  Dr. Patty Sermons in 2 weeks.  She is instructed to call to arrange this  appointment.  She will also see Dr. Laneta Simmers in 3 weeks.  The office will  call with this appointment.   FINAL DIAGNOSIS:  Severe three-vessel coronary artery disease on  presentation with non-Q-wave myocardial infarction as described.   OTHER DIAGNOSES INCLUDE:  1. Bibasilar infiltrates versus pulmonary edema prior to surgery,      resolved.  2. Right upper extremity cellulitis, resolved.  3. Diabetes mellitus.  4. Hypertension.  5. Hyperlipidemia.  6. History of  breast cancer.  7. History uterine cancer.  8. Preoperative urinary tract infection.      Rowe Clack, P.A.-C.      Evelene Croon, M.D.  Electronically Signed    WEG/MEDQ  D:  12/26/2006  T:  12/26/2006  Job:  [id number]   cc:   Cassell Clement, M.D.  Rockey Situ. Flavia Shipper., M.D.  [facility]

## 2011-02-12 NOTE — Telephone Encounter (Signed)
When I tried to approve, it came up duplicate therapy.  Not sure if she is to take generic dyazide or generic maxzide.  Please advies

## 2011-02-12 NOTE — Op Note (Signed)
[name], [name] NO.:  [id number]   MEDICAL RECORD NO.:  [id number]          PATIENT TYPE:  INP   LOCATION:  2301                         FACILITY:  [facility]   PHYSICIAN:  Evelene Croon, M.D.     DATE OF BIRTH:  May 16, 1933   DATE OF PROCEDURE:  12/22/2006  DATE OF DISCHARGE:                               OPERATIVE REPORT   PREOPERATIVE DIAGNOSIS:  Severe three-vessel coronary artery disease.   POSTOPERATIVE DIAGNOSIS:  Severe three-vessel coronary artery disease.   OPERATIVE PROCEDURE:  Median sternotomy, extracorporeal circulation,  coronary artery bypass graft surgery x5 using a left internal mammary  artery graft, left anterior descending coronary artery, with a saphenous  vein graft to the diagonal branch of the left anterior descending, a  sequential saphenous vein graft to the second and fourth obtuse marginal  branches of the left circumflex coronary artery, and a saphenous vein  graft to the posterior descending branch of the right coronary anatomy.  Endoscopic vein harvesting from the left leg.   ATTENDING SURGEON:  Evelene Croon, M.D.   ASSISTANT:  Coral Ceo, P.A.   ANESTHESIA:  General endotracheal.   CLINICAL HISTORY:  This patient is a 77 year old woman who presented  initially with a left sided chest pain that was occurring with rest.  She had about a 5-6 month history of increasing shortness of breath.  She ruled in for a myocardial infarction with a peak CPK of 674 and MB  of 64.7.  Troponin went from 0.43 to 21.37 to 9.87.  She was transferred  to [facility] on the March 13 and underwent cardiac catheterization  which showed severe three-vessel coronary artery disease.  This showed a  long segmental 80-90% LAD stenosis proximally.  There was a moderate  sized diagonal that had significant stenosis.  The left circumflex gave  off a first marginal that had no significant disease and then was  occluded.  The other obtuse marginal branch is  filled by bridging  collaterals from the left.  The right coronary artery was occluded  proximally with bridging collaterals filling the distal vessel as well a  the left to right collaterals.  Left ventriculogram showed mild inferior  hypokinesis.  After review of the cardiac catheterization, it was felt  that coronary artery bypass graft surgery was the best treatment.  Unfortunately, the patient also is noted to have severe cellulitis of  the right arm and had prior mastectomy on that side.  She subsequently  developed sepsis with positive blood cultures and respiratory difficulty  related to this.  Her cultures grew out Streptococcus viridans.  She  also developed a urinary tract infection.  She was in the intensive care  unit for a while and was treated wit intravenous antibiotics.  Infectious disease consultation was obtained.  After an appropriate  course of IV antibiotics, the cellulitis essentially resolved and follow  up blood cultures were negative.  She clinically was doing well without  chest pain at this point.  It was felt that coronary artery bypass graft  surgery could be performed safely.  I discussed the operative procedure  with the patient and her daughters including alternatives, benefits, and  risks including but not limited to bleeding, blood transfusion,  infection, stroke, myocardial infarction, graft failure, and death.  She  understood and agreed to proceed.   OPERATIVE PROCEDURE:  The patient was taken to the operating room and  placed on the table in the supine position.  After induction of general  endotracheal anesthesia, a Foley catheter was placed in the bladder  using sterile technique.  Then the chest, abdomen and both lower  extremities were prepped and draped in the usual sterile manner.  The  chest was entered through a median sternotomy incision and the  pericardium opened midline.  Examination of the heart showed good  ventricular contractility.   The ascending aorta had no palpable plaques  in it.   Then the left internal mammary artery was harvested from the chest wall  as pedicle graft.  This was a medium caliber vessel with excellent blood  flow through it.  At the same time, a segment of greater saphenous vein  was harvested from the left leg using endoscopic vein harvest technique.  This vein was a medium size and good quality.  We initially tried to  find the vein in the right leg adjacent to the right knee but could not  find a vein that appeared to be the saphenous vein in this area.  Therefore, we went to the left leg.   Then the patient was heparinized and when an adequate activated clotting  time was achieved, the distal ascending aorta was cannulated using a 20-  [place] aortic cannula for arterial inflow.  The venous outflow was  achieved using a two-stage venous cannula through the right atrial  appendage and antegrade cardioplegia and vent cannula was inserted in  the aortic root.   The patient was placed on cardiopulmonary bypass and the distal coronary  artery was identified.  The LAD was a large graftable vessel with no  significant distal disease in it.  The diagonal branch was a large  graftable vessel.  The first marginal had no visible disease.  The  second marginal was small borderline graftable vessel.  The third  marginal was tiny and not suitable for grafting.  The fourth marginal  was lying fairly high on the lateral wall and was also a small vessel of  borderline suitability for grafting.  The right coronary artery was  diffusely diseased.  There was a moderate sized posterior descending  branch that was suitable for grafting.   Then the aorta was cross clamped and 500 mL of cold blood antegrade  cardioplegia was administered in the aortic root with quick arrest  allowed.  System hypothermia to 28.0 degrees Centigrade and topical hypothermia to 28 degrees C was used.  A temperature probe was placed  in  the septum and insulating pad in the pericardium.   The first distal anastomosis was then performed to the posterior  descending coronary artery.  The internal diameter of this vessel was  about 1.75 mm.  The conduit used was a 7 mm saphenous vein and the  anastomosis was performed in an end-to-side manner using continuous 7-0  Prolene suture.  Flow was admitted to the graft and was excellent.  Then  another dose of cardioplegia was given down the vein graft.   The second distal anastomosis was performed to the diagonal branch.  The  internal diameter of this vessel was about 1.75 mm.  The conduit used  was  a second 7 mm saphenous vein and the anastomosis was performed in an  end-to-side manner using continuous 7-0 Prolene suture.  Flow was  admitted to the graft and was excellent.   The third distal anastomosis was performed to the second marginal  branch.  The internal diameter of this vessel was about 1 to 1.25 mm.  A  1 mm probe passed easily down the vessel but a 1.5 mm probe would not  pass.  The conduit used was a third 7 mm saphenous vein and the  anastomosis performed in a sequential side-to-side manner using  continuous 8-0 Prolene suture.  Flow was admitted to the graft and was  good.   The fourth distal anastomosis was performed to the fourth marginal  branch.  The internal diameter of this vessel was about 1.25 mm.  The  conduit used was then same 7 mm saphenous vein and the anastomosis  performed in a sequential end-to-side manner using continuous 8-0  Prolene suture.  The flow was admitted to the graft and was good.  Another dose of cardioplegia was given to all the vein grafts and the  aortic root.   The fifth distal anastomosis was then performed to the mid LAD.  The  internal diameter of this vessel was about 2 mm.  The conduit used was a  left internal mammograft and this brought through an opening in the left  pericardium anterior to the phrenic nerve.  It  was anastomosed to the  LAD in an end-to-side manner using continuous 8-0 Prolene suture.  The  pedicle was sutured to that part with 6-0 Prolene sutures.  The patient  was rewarmed to 37.0 degrees centigrade.  With a cross-clamp in place,  the 3 proximal vein graft anastomoses were performed in the aortic root  in an end-to-side manner using continuous 6-0 Prolene suture.  Then the  clamp was removed from the mammary pedicle.  The was rapid warming of  the ventricular septum and return of spontaneous ventricular  fibrillation.  The cross-clamp was removed at the time of 95 minutes and  the patient spontaneously converted to sinus rhythm.  The proximal and  distal anastomoses appeared hemostatic and allowed the grasp  satisfactorily.  Graft borders were placed around the proximal  anastomoses.  Two temporary right ventricular and right atrial pacing  wires placed to the skin.  When the patient was rewarmed to 37.0 degrees centigrade, she was weaned  from cardiopulmonary bypass on no inotropic agents.  Total bypass time  was 115 minutes.  Cardiac function appeared excellent with a cardiac  output of 4 liters per minute.  Protamine was given and the venous and  aortic cannulas removed without difficulty.  Hemostasis was achieved.  Three chest tubes were placed, with two in the posterior pericardium,  one left pleural stage and one in the anterior mediastinum.  The  pericardium was reapproximated over the heart. The sternum was closed  with #6 stainless steel wires.  Fascia was closed with continuous #1  Vicryl suture.  The subcutaneous tissue was closed with continuous 2-0  Vicryl and the skin with a 3-0 Vicryl subcuticular closure.  The lower  extremity and harvest site was closed in layers in a similar manner.  The sponge, needle, and instrument counts were correct according to  scrub nurse.  Sterile dressings were applied over the incisions, around  the chest tubes which were hooked to  Pleur-evac suction.  The patient  remained hemodynamically stable and was  transported to the SICU in  guarded by stable condition.      Evelene Croon, M.D.  Electronically Signed     BB/MEDQ  D:  12/22/2006  T:  12/22/2006  Job:  [id number]   cc:   Cassell Clement, M.D.

## 2011-02-12 NOTE — Consult Note (Signed)
NAME:  [name], [name] NO.:  [id number]   MEDICAL RECORD NO.:  [id number]          PATIENT TYPE:  INP   LOCATION:  2314                         FACILITY:  [facility]   PHYSICIAN:  Sheliah Plane, MD    DATE OF BIRTH:  1932-10-26   DATE OF CONSULTATION:  12/09/2006  DATE OF DISCHARGE:                                 CONSULTATION   Follow-up cardiologist:  Cassell Clement, M.D.  Primary care physician:  Dr. Pecola Leisure.   REASON FOR CONSULTATION:  Coronary occlusive disease, unstable  angina/subendocardial myocardial infarction.   HISTORY OF PRESENT ILLNESS:  The patient is a 77 year old female who  presented to [facility] emergency room with sharp left-sided chest pain  of several episodes, came on at rest.  She has noted increasing  shortness of breath with exertion for 5-6 months.  The pains occurred at  rest without any precipitating factors.  She denies nausea, vomiting or  diaphoresis associated with it.  She was admitted to [facility].  Total  CK was 674 with MB of 64.7, troponins went from 0.43 to 21.37 to 9.87.  On the 13th she was transferred to [facility] and cardiac  catheterization was done.  Since that time she has been without pain.  Consultation was requested for severe three-vessel disease.  In  addition, at the time of admission the patient was noted to have severe  cellulitis in the right arm, the same side she has previously had a  mastectomy on.   Her cardiac history is negative for a history of myocardial infarction  or angioplasty in the past.  She does have a history of hypertension,  hyperlipidemia, poorly-controlled diabetes for 5-8 years, nonsmoker.   FAMILY HISTORY:  Significant for her father, who died of a myocardial  infarction at age 104.  She has one brother with coronary artery disease.   No previous stroke.  No history of claudication.  No renal  insufficiency.   PAST MEDICAL HISTORY:  1. History of breast cancer, status post  right mastectomy in 1979.  2. Also a history of uterine cancer in 1993 with hysterectomy.  3. History of thyroid disease.   The patient lives alone and is employed doing primarily desk accounting  work.  Denies alcohol use.   MEDICATIONS AT THE TIME OF ADMISSION:  1. Thyroxine 0.5 mcg per day.  2. Glyburide 5 mg two tablets daily.  3. Actos 45 mg daily.  4. One aspirin.  5. Triamterine/hydrochlorothiazide 37.5/25 mg.   DRUG ALLERGIES:  MORPHINE.   REVIEW OF SYSTEMS:  CARDIAC:  Positive for chest pain and exertional  shortness of breath for at least 6 months and increased fatigue over the  past 3 weeks.  Denies pedal edema.  Denies palpitations, syncope,  presyncope, orthopnea, or resting shortness of breath.  GENERAL:  The  patient denies any constitutional symptoms other than fatigue.  Denies  fevers, chills or night sweats.  RESPIRATORY:  She has had increasing  shortness of breath with exertion.  Denies resting shortness of breath.  GASTROINTESTINAL:  Has had history of hiatal hernia.  Denies amaurosis  or  TIAs.  Does complain of arthritis in her knees, hips.  Denies any  hematuria.  She has had a yeast infection in the groins.  Complains of  right arm being swollen and warm over the day prior to admission.  History of hypothyroidism though without complaint.  A CT scan was done  showing chronic right maxillary sinusitis.   PHYSICAL EXAMINATION:  GENERAL:  The patient is awake, alert and  neurologically intact.  The patient was neurologically intact and able  to relate her history.  VITAL SIGNS:  Blood pressure is 111/44, pulse is 84, respiratory rate is  18, in no distress.  O2 saturation is 95%.  She is 65 inches tall,  weighs 79.3 kg.  HEENT:  Pupils equal, round, and reactive to light.  NECK:  Without carotid bruits.  LUNGS:  Clear bilaterally.  CARDIAC:  Regular rate and rhythm without murmur or gallop.  The patient  does have known trace mitral regurgitation at the  time of  catheterization.  No murmurs heard.  ABDOMEN:  Benign without palpable masses or tenderness.  EXTREMITIES:  Lower extremities have +2 posterior tibial pulses, +1 DP  pulses bilaterally.  There is significant cellulitis involving the right  upper arm just above the elbow, which is red and tender and swollen.  CHEST:  Previous right mastectomy.  LYMPHATIC:  There are no palpable lymph nodes in the axillary, inguinal  or supraclavicular areas.   LABORATORY FINDINGS:  White count of 16,000, hematocrit 31, platelet  count 119.  Creatinine of 1.1, glucose of 303.  SGOT slightly elevated  at 88, SGPT is 26.  Cardiac catheterization shows mild inferior  hypokinesis with significant three-vessel disease, a long segmental 80-  90% stenosis of the LAD, total occlusion of the circumflex after the  takeoff of the OM.  A 100% proximal right with bridging collaterals.   IMPRESSION:  1. Patient with non-ST elevation myocardial infarction and new onset      of angina.  2. Significant three-vessel coronary artery disease.  3. Question of mitral regurgitation.  4. Active cellulitis in the right upper arm associated with right      mastectomy.   SUGGESTIONS:  1. Obtain echocardiogram to further evaluate the degree of mitral      regurgitation.  2. With the severe nature of the patient's three-vessel coronary      artery disease, coronary artery bypass would be recommended;      however, the patient has active infection of the right forearm that      needs to be treated and cleared up before proceeding with coronary      artery bypass grafting.  The risks and options have been discussed      with the patient and her daughters in detail, and she is agreeable      with surgery when the clinical situation is appropriate.      Sheliah Plane, MD  Electronically Signed    EG/MEDQ  D:  12/12/2006  T:  12/12/2006  Job:  [id number]

## 2011-02-12 NOTE — Cardiovascular Report (Signed)
NAME:  [name], [name] NO.:  [id number]   MEDICAL RECORD NO.:  [id number]          PATIENT TYPE:  INP   LOCATION:  2314                         FACILITY:  [facility]   PHYSICIAN:  Peter M. Swaziland, M.D.  DATE OF BIRTH:  11/25/1932   DATE OF PROCEDURE:  DATE OF DISCHARGE:                            CARDIAC CATHETERIZATION   INDICATIONS FOR PROCEDURE:  [name] is a 77 year old white female  with history of diabetes and hyperlipidemia, who presented with a non-Q-  wave myocardial infarction.  She had ongoing angina, and so cardiac  catheterization was recommended urgently.  The patient also has a right  arm cellulitis and is acutely febrile.   PROCEDURES:  Left heart catheterization, coronary left ventricular  angiography, access via the right femoral artery using standard  Seldinger technique.   EQUIPMENT:  A 6-French 4-cm right and left Judkins' catheter, 6-French  pigtail catheter, 6-French arterial sheath.   MEDICATIONS:  Local anesthesia, 1% Xylocaine.   CONTRAST:  95 mL of Omnipaque.   HEMODYNAMIC DATA:  Aortic pressure is 117/58 with a mean of 83 mmHg.  Left ventricle pressure is 118 with EDP of 26 mmHg.   ANGIOGRAPHIC DATA:  The left coronary arises and distributes normally.  The left main coronary artery is without significant disease.   The left side left anterior descending artery has a long segmental  segment of disease in the proximal to mid-vessel, up to 80-90%.  This  spans the takeoff of the first diagonal branch, which is also involved  in this lesion.  The mid to distal LAD has mild wall irregularities.   The left circumflex coronary artery is occluded after the takeoff of the  first obtuse marginal vessel.  The second obtuse marginal and distal  circumflex filled by left-to-left collaterals.  There also left-to-right  collaterals to the posterior descending and posterolateral branches of  the right coronary.   The right coronary arises  normally.  It is occluded proximally.  There  are bridging collaterals to right to the ventricular marginal branch and  the mid-right coronary artery.   The left ventricular angiography was performed in RAO view.  This  demonstrates normal left ventricular size with inferior wall  hypokinesia.  There is overall low normal left ventricle systolic  function with ejection fraction estimated at 50%.  There is mild mitral  regurgitation.   FINAL INTERPRETATION:  1. Severe three-vessel obstructive atherosclerotic coronary disease.  2. Low normal left ventricular function.  3. Mild mitral insufficiency.   PLAN:  At this point, the patient was pain free and hemodynamically  stable.  She will be transferred to the coronary intensive care unit for  aggressive medical therapy and treatment of her comorbid conditions.  Once she is improved from a medical standpoint, would consider coronary  artery bypass surgery for long-term revascularization.           ______________________________  Peter M. Swaziland, M.D.     PMJ/MEDQ  D:  12/08/2006  T:  12/10/2006  Job:  [id number]   cc:   Cassell Clement, M.D.

## 2011-02-23 ENCOUNTER — Encounter: Payer: Self-pay | Admitting: *Deleted

## 2011-02-24 ENCOUNTER — Ambulatory Visit
Admission: RE | Admit: 2011-02-24 | Discharge: 2011-02-24 | Disposition: A | Discharge status: Home / Self Care | Payer: Medicare Other | Source: Ambulatory Visit | Attending: Cardiology | Admitting: Cardiology

## 2011-02-24 ENCOUNTER — Encounter: Payer: Self-pay | Admitting: Cardiology

## 2011-02-24 ENCOUNTER — Ambulatory Visit (INDEPENDENT_AMBULATORY_CARE_PROVIDER_SITE_OTHER): Payer: Medicare Other | Admitting: Cardiology

## 2011-02-24 VITALS — BP 130/70 | HR 64 | Wt 185.0 lb

## 2011-02-24 DIAGNOSIS — Z0189 Encounter for other specified special examinations: Secondary | ICD-10-CM

## 2011-02-24 DIAGNOSIS — G609 Hereditary and idiopathic neuropathy, unspecified: Secondary | ICD-10-CM

## 2011-02-24 DIAGNOSIS — R0602 Shortness of breath: Secondary | ICD-10-CM

## 2011-02-24 DIAGNOSIS — G629 Polyneuropathy, unspecified: Secondary | ICD-10-CM

## 2011-02-24 DIAGNOSIS — IMO0001 Reserved for inherently not codable concepts without codable children: Secondary | ICD-10-CM

## 2011-02-24 DIAGNOSIS — Z7689 Persons encountering health services in other specified circumstances: Secondary | ICD-10-CM

## 2011-02-24 DIAGNOSIS — I519 Heart disease, unspecified: Secondary | ICD-10-CM

## 2011-02-24 DIAGNOSIS — I10 Essential (primary) hypertension: Secondary | ICD-10-CM

## 2011-02-24 DIAGNOSIS — R0989 Other specified symptoms and signs involving the circulatory and respiratory systems: Secondary | ICD-10-CM

## 2011-02-24 MED ORDER — LOSARTAN POTASSIUM 50 MG PO TABS: 50.0000 mg | ORAL_TABLET | Freq: Every day | ORAL | Status: DC

## 2011-02-24 NOTE — Progress Notes (Signed)
[name] Date of Birth:  06/04/1933 [facility] / [facility] [address]   [address] [place], [place]  [address] [phone]           Fax   [phone]  History of Present Illness: This pleasant alert Caucasian female is seen for a scheduled followup office visit.  She has a history of ischemic heart disease and is status post CABG which was done on 12/22/06.  She has not been expressing any angina pectoris.  She does complain of exertional dyspnea.  She has not been having a cough or sputum production.  She wonders if the exertional dyspnea is secondary to not exercising and having gained weight.  Her last chest x-ray was several years ago and we will get an updated chest x-ray to look for evidence of CHF her last echocardiogram was 06/04/09 and showed normal left ventricular systolic function with an ejection fraction of 55-60% and showed impaired relaxation as well as mild to moderate aortic stenosis and trace tricuspid regurgitation.  Her last nuclear stress test was 08/13/09 and was a LexiScan study which showed a small area of mild ischemia in the inferolateral wall her overall left ventricular function was normal without regional wall motion abnormalities and his was felt that this was consistent with a small old scar with mild peri-infarct ischemia.  Continue aggressive medical management was advised.  Current Outpatient Prescriptions  Medication Sig Dispense Refill  . aspirin 81 MG tablet Take 81 mg by mouth daily.        . Ferrous Sulfate (IRON) 325 (65 FE) MG TABS Take 1 tablet by mouth 2 (two) times daily.        Marland Kitchen glyBURIDE (DIABETA) 5 MG tablet Take 5 mg by mouth 2 (two) times daily with a meal.        . levothyroxine (LEVOTHROID) 50 MCG tablet Take 50 mcg by mouth daily.        . metoprolol succinate (TOPROL-XL) 25 MG 24 hr tablet Take 25 mg by mouth 2 (two) times daily.        . Multiple Vitamins-Minerals (CENTRUM CARDIO PO) Take by mouth 2 (two) times  daily.        . rosuvastatin (CRESTOR) 5 MG tablet Take 5 mg by mouth once a week.        . traMADol (ULTRAM) 50 MG tablet Take 50 mg by mouth every 6 (six) hours as needed. Taking daily as needed       . triamterene-hydrochlorothiazide (DYAZIDE) 37.5-25 MG per capsule TAKE 1 CAPSULE EVERY DAY  90 capsule  1  . losartan (COZAAR) 50 MG tablet Take 1 tablet (50 mg total) by mouth daily.  30 tablet  11  . DISCONTD: atorvastatin (LIPITOR) 10 MG tablet Take 10 mg by mouth every other day.        Marland Kitchen DISCONTD: triamterene-hydrochlorothiazide (MAXZIDE-25) 37.5-25 MG per tablet Take 1 tablet by mouth daily.          Allergies  Allergen Reactions  . Morphine And Related     Patient Active Problem List  Diagnoses  . DM (diabetes mellitus)  . Hypothyroidism  . High cholesterol  . HTN (hypertension)  . Iron deficiency anemia  . CAD (coronary artery disease)  . Breast cancer  . Uterine cancer  . Allergic rhinitis  . Peripheral neuropathy  . Dyspnea on exertion    History  Smoking status  . Never Smoker   Smokeless tobacco  . Not on file  History  Alcohol Use No    Family History  Problem Relation Age of Onset  . Cancer Mother     ? stomach cancer  . Diabetes Mother   . Heart disease Father 79  . Diabetes Brother   . Throat cancer Brother     Review of Systems: Constitutional: no fever chills diaphoresis or fatigue or change in weight.  Head and neck: no hearing loss, no epistaxis, no photophobia or visual disturbance. Respiratory: No cough, shortness of breath or wheezing. Cardiovascular: No chest pain peripheral edema, palpitations. Gastrointestinal: No abdominal distention, no abdominal pain, no change in bowel habits hematochezia or melena. Genitourinary: No dysuria, no frequency, no urgency, no nocturia. Musculoskeletal:No arthralgias, no back pain, no gait disturbance or myalgias. Neurological: No dizziness, no headaches, no numbness, no seizures, no syncope, no  weakness, no tremors. Hematologic: No lymphadenopathy, no easy bruising. Psychiatric: No confusion, no hallucinations, no sleep disturbance.    Physical Exam: Filed Vitals:   02/24/11 1454  BP: 130/70  Pulse: 64  The general appearance reveals a well-developed well-nourished woman in no acute distress.  Pupils equal and reactive.   Extraocular Movements are full.  There is no scleral icterus.  The mouth and pharynx are normal.  The neck is supple.  The carotids reveal no bruits.  The jugular venous pressure is normal.  The thyroid is not enlarged.  There is no lymphadenopathy.The chest is clear to percussion and auscultation. There are no rales or rhonchi. Expansion of the chest is symmetrical.The precordium is quiet.  The first heart sound is normal.  The second heart sound is physiologically split.  There is no  gallop rub or click. There is a soft basil systolic murmur. There is no abnormal lift or heave.The abdomen is soft and nontender. Bowel sounds are normal. The liver and spleen are not enlarged. There Are no abdominal masses. There are no bruits.Normal extremity without phlebitis or edema.  Pedal pulses are good.The skin is warm and dry.  There is no rash.Strength is normal and symmetrical in all extremities.  There is no lateralizing weakness.  There are no sensory deficits.  EKG shows normal sinus rhythm first degree AV block and minor T-wave flattening.   Assessment / Plan: For her dyspnea and known ischemic heart disease we will add an ARB in the form of losartan 50 mg one daily.  We are getting a chest x-ray on her today.  She will return in one week for a followup basal metabolic panel after starting the losartan.  Her return in 4 months for followup office she was encouraged to lose weight and to start a modest exercise program.

## 2011-02-24 NOTE — Assessment & Plan Note (Signed)
The patient has a history of essential hypertension.  She has not been having any headaches or dizzy spells.  She is not presently on an ACE inhibitor.  She does not know of any allergy to an ACE inhibitor but does recall that about 15 years ago a physician put her on a blood pressure medicine which made her cough.It was probably an ACE inhibitor.  For her vascular disease and her diabetes she would benefit from An ACE inhibitor or an ARB.  To avoid the cough we will go straight to the ARB and Select losartan 50 mg one daily.We will have her return in one week after starting it to get followup basal metabolic panel to be sure that her BUN and potassium are okay

## 2011-02-24 NOTE — Assessment & Plan Note (Signed)
The patient has diabetic neuropathy with painful burning feet.  She was recently given a prescription for gabapentin but has not yet started it.  I encouraged her to try it.

## 2011-02-24 NOTE — Assessment & Plan Note (Signed)
The patient has known ischemic heart disease and is status post CABG on 12/22/06.  She has not been having any recurrent chest pain.  Today she does complain of exertional dyspnea.  She herself thinks that her dyspnea is coming from the fact that she is overweight and is not exercising regularly to keep in shape.  She has not been having any orthopnea or paroxysmal nocturnal dyspnea or ankle edema.

## 2011-02-25 ENCOUNTER — Telehealth: Payer: Self-pay | Admitting: *Deleted

## 2011-02-25 NOTE — Telephone Encounter (Signed)
Adv. Patient of CXR results

## 2011-03-03 ENCOUNTER — Other Ambulatory Visit (INDEPENDENT_AMBULATORY_CARE_PROVIDER_SITE_OTHER): Payer: Medicare Other | Admitting: *Deleted

## 2011-03-03 DIAGNOSIS — I10 Essential (primary) hypertension: Secondary | ICD-10-CM

## 2011-03-03 LAB — BASIC METABOLIC PANEL
BUN: 22 mg/dL (ref 6–23)
Chloride: 106 mEq/L (ref 96–112)
Creatinine, Ser: 1.3 mg/dL — ABNORMAL HIGH (ref 0.4–1.2)
Glucose, Bld: 285 mg/dL — ABNORMAL HIGH (ref 70–99)

## 2011-03-05 ENCOUNTER — Telehealth: Payer: Self-pay | Admitting: *Deleted

## 2011-03-05 NOTE — Telephone Encounter (Signed)
Advised of lab results 

## 2011-03-05 NOTE — Progress Notes (Signed)
Advised of labs 

## 2011-03-05 NOTE — Progress Notes (Signed)
Left message

## 2011-03-05 NOTE — Telephone Encounter (Signed)
Message copied by Burnell Blanks on Fri Mar 05, 2011 10:27 AM ------      Message from: Cassell Clement      Created: Wed Mar 03, 2011  5:14 PM       Please Report.Potassium level and kidney function is stable on Losartan.  Continue same dose.

## 2011-04-03 ENCOUNTER — Other Ambulatory Visit: Payer: Self-pay | Admitting: Cardiology

## 2011-04-08 ENCOUNTER — Other Ambulatory Visit: Payer: Self-pay | Admitting: *Deleted

## 2011-04-08 MED ORDER — METOPROLOL SUCCINATE ER 25 MG PO TB24: 25.0000 mg | ORAL_TABLET | Freq: Two times a day (BID) | ORAL | Status: DC

## 2011-04-08 NOTE — Telephone Encounter (Signed)
Fax received from pharmacy. Refill completed. Jodette Kaevon Cotta RN  

## 2011-04-08 NOTE — Telephone Encounter (Signed)
Fax received from pharmacy. Refill completed. Jodette Tawonda Legaspi RN  

## 2011-04-09 LAB — HM DIABETES EYE EXAM

## 2011-04-16 ENCOUNTER — Encounter: Payer: Self-pay | Admitting: Family Medicine

## 2011-04-29 ENCOUNTER — Other Ambulatory Visit: Payer: Self-pay | Admitting: *Deleted

## 2011-04-29 DIAGNOSIS — E079 Disorder of thyroid, unspecified: Secondary | ICD-10-CM

## 2011-04-29 MED ORDER — LEVOTHYROXINE SODIUM 50 MCG PO TABS: 50.0000 ug | ORAL_TABLET | Freq: Every day | ORAL | Status: DC

## 2011-04-29 NOTE — Telephone Encounter (Signed)
Refilled levothyroxine 50mcg. 

## 2011-04-29 NOTE — Telephone Encounter (Signed)
Refilled levothyroxine .

## 2011-05-10 ENCOUNTER — Other Ambulatory Visit: Payer: Medicare Other

## 2011-05-11 ENCOUNTER — Other Ambulatory Visit (INDEPENDENT_AMBULATORY_CARE_PROVIDER_SITE_OTHER): Payer: Medicare Other | Admitting: Family Medicine

## 2011-05-11 DIAGNOSIS — E7800 Pure hypercholesterolemia, unspecified: Secondary | ICD-10-CM

## 2011-05-11 DIAGNOSIS — E119 Type 2 diabetes mellitus without complications: Secondary | ICD-10-CM

## 2011-05-11 LAB — LIPID PANEL
HDL: 47.8 mg/dL (ref >39.00)
Total CHOL/HDL Ratio: 6
Triglycerides: 360 mg/dL — ABNORMAL HIGH (ref 0.0–149.0)
VLDL: 72 mg/dL — ABNORMAL HIGH (ref 0.0–40.0)

## 2011-05-11 LAB — LDL CHOLESTEROL, DIRECT: Direct LDL: 156.3 mg/dL

## 2011-05-14 ENCOUNTER — Ambulatory Visit (INDEPENDENT_AMBULATORY_CARE_PROVIDER_SITE_OTHER): Payer: Medicare Other | Admitting: Family Medicine

## 2011-05-14 ENCOUNTER — Encounter: Payer: Self-pay | Admitting: Family Medicine

## 2011-05-14 DIAGNOSIS — I2510 Atherosclerotic heart disease of native coronary artery without angina pectoris: Secondary | ICD-10-CM

## 2011-05-14 DIAGNOSIS — G609 Hereditary and idiopathic neuropathy, unspecified: Secondary | ICD-10-CM

## 2011-05-14 DIAGNOSIS — I10 Essential (primary) hypertension: Secondary | ICD-10-CM

## 2011-05-14 DIAGNOSIS — E119 Type 2 diabetes mellitus without complications: Secondary | ICD-10-CM

## 2011-05-14 DIAGNOSIS — E7800 Pure hypercholesterolemia, unspecified: Secondary | ICD-10-CM

## 2011-05-14 DIAGNOSIS — E039 Hypothyroidism, unspecified: Secondary | ICD-10-CM

## 2011-05-14 DIAGNOSIS — G629 Polyneuropathy, unspecified: Secondary | ICD-10-CM

## 2011-05-14 MED ORDER — ROSUVASTATIN CALCIUM 10 MG PO TABS: ORAL_TABLET | ORAL | Status: DC

## 2011-05-14 MED ORDER — TRAMADOL HCL 50 MG PO TABS: 50.0000 mg | ORAL_TABLET | Freq: Every day | ORAL | Status: DC

## 2011-05-14 NOTE — Assessment & Plan Note (Signed)
Treated in past moderately well with tramadol at bedtime. Refilled. Consider gabapentin if not ideally controlled in future.

## 2011-05-14 NOTE — Assessment & Plan Note (Signed)
Excellent control. We will try to decrease glipizide to one tablet daily. Return in 3 months for repeat A1C.

## 2011-05-14 NOTE — Progress Notes (Signed)
  Subjective:    Patient ID: [name], female    DOB: 09-28-1932, 77 y.o.   MRN: [id number]  HPI 77 year old female her for follow up.  Hypertension: Well controlled  On diazide and cozaar.   Using medication without problems or lightheadedness:  Chest pain with exertion: none Edema:none Short of breath: stable since heart surgery. Average home BPs: Not following at home. Other issues:  Diabetes:  Well controlled , almost normal on gluyburide. Using medications without difficulties: Hypoglycemic episodes:Nont checking Hyperglycemic episodes:? Feet problems: Foot pain, burning, stabbing pain in feet with peripheral neuropathy.. ongping for years. Out of tramadol for pain. Was much netter controlled on this.  Blood Sugars averaging:Nont checking eye exam within last year:  Elevated Cholesterol: poor control on crestor 5 mg once weekly. LDL above goal <70 and triglycerides high.  Interested in cheaper options. Using medications without problems: Muscle aches: None currently, but some on higher dose in past. Other complaints:  Hypothyroid: Overdue for eval.  CAD... S/P Quad bypass .Marland Kitchen..followed by Dr. Patty Sermons. Last seen in 02/2011.. Sees every six months. S/P breast cancer 1979...right mastectomy and chemo  S/P uterine cancer 1993.Marland KitchenMarland KitchenTAH    Review of Systems  Constitutional: Negative for fever and fatigue.  HENT: Negative for ear pain.   Eyes: Negative for pain.  Respiratory: Negative for chest tightness and wheezing.   Cardiovascular: Negative for chest pain, palpitations and leg swelling.  Gastrointestinal: Negative for abdominal pain.  Genitourinary: Negative for dysuria.       Objective:   Physical Exam  Constitutional: Vital signs are normal. She appears well-developed and well-nourished. She is cooperative.  Non-toxic appearance. She does not appear ill. No distress.       Elderly overweight.  HENT:  Head: Normocephalic.  Right Ear: Hearing, tympanic  membrane, external ear and ear canal normal. Tympanic membrane is not erythematous, not retracted and not bulging.  Left Ear: Hearing, tympanic membrane, external ear and ear canal normal. Tympanic membrane is not erythematous, not retracted and not bulging.  Nose: No mucosal edema or rhinorrhea. Right sinus exhibits no maxillary sinus tenderness and no frontal sinus tenderness. Left sinus exhibits no maxillary sinus tenderness and no frontal sinus tenderness.  Mouth/Throat: Uvula is midline, oropharynx is clear and moist and mucous membranes are normal.  Eyes: Conjunctivae, EOM and lids are normal. Pupils are equal, round, and reactive to light. No foreign bodies found.  Neck: Trachea normal and normal range of motion. Neck supple. Carotid bruit is not present. No mass and no thyromegaly present.  Cardiovascular: Normal rate, regular rhythm, S1 normal, S2 normal, normal heart sounds, intact distal pulses and normal pulses.  Exam reveals no gallop and no friction rub.   No murmur heard. Pulmonary/Chest: Effort normal and breath sounds normal. Not tachypneic. No respiratory distress. She has no decreased breath sounds. She has no wheezes. She has no rhonchi. She has no rales.  Abdominal: Soft. Normal appearance and bowel sounds are normal. There is no tenderness.  Neurological: She is alert.  Skin: Skin is warm, dry and intact. No rash noted.  Psychiatric: Her speech is normal and behavior is normal. Judgment and thought content normal. Her mood appears not anxious. Cognition and memory are normal. She does not exhibit a depressed mood.   Diabetic foot exam: Normal inspection No skin breakdown No calluses  Normal DP pulses Normal sensation to light touch and monofilament Nails normal         Assessment & Plan:

## 2011-05-14 NOTE — Assessment & Plan Note (Signed)
Reeval with next labs.

## 2011-05-14 NOTE — Assessment & Plan Note (Signed)
Well controlled. Continue current medication.  

## 2011-05-14 NOTE — Assessment & Plan Note (Signed)
Stable per Dr. Patty Sermons.

## 2011-05-14 NOTE — Patient Instructions (Addendum)
Fish oil or flax seed over the counter...2000 mg divided daily. Orange or lemon. Active ingredients omega three fatty acids: ALA, DHA, EPA Try to graduallly titrate up crestor to two times a week. Decrease glipizide DM medicaine to 1 tablet daily. Use tramadol for foot pain.

## 2011-05-14 NOTE — Assessment & Plan Note (Signed)
Poor control. Add fish oil for triglycerides. Try to increase crestor to 2-3 times a week. Changed to higher dose to half to help with cost.  Recehck in 3 months.

## 2011-05-18 ENCOUNTER — Other Ambulatory Visit: Payer: Self-pay | Admitting: *Deleted

## 2011-05-19 ENCOUNTER — Other Ambulatory Visit: Payer: Self-pay | Admitting: Family Medicine

## 2011-05-19 ENCOUNTER — Other Ambulatory Visit: Payer: Self-pay | Admitting: *Deleted

## 2011-06-07 ENCOUNTER — Encounter: Payer: Self-pay | Admitting: Family Medicine

## 2011-06-07 LAB — HM MAMMOGRAPHY: HM Mammogram: NORMAL

## 2011-06-22 ENCOUNTER — Encounter: Payer: Self-pay | Admitting: Cardiology

## 2011-06-22 ENCOUNTER — Ambulatory Visit (INDEPENDENT_AMBULATORY_CARE_PROVIDER_SITE_OTHER): Payer: Medicare Other | Admitting: Cardiology

## 2011-06-22 VITALS — BP 150/76 | HR 64 | Wt 184.0 lb

## 2011-06-22 DIAGNOSIS — I119 Hypertensive heart disease without heart failure: Secondary | ICD-10-CM

## 2011-06-22 DIAGNOSIS — R0989 Other specified symptoms and signs involving the circulatory and respiratory systems: Secondary | ICD-10-CM

## 2011-06-22 DIAGNOSIS — Z9889 Other specified postprocedural states: Secondary | ICD-10-CM

## 2011-06-22 DIAGNOSIS — E119 Type 2 diabetes mellitus without complications: Secondary | ICD-10-CM

## 2011-06-22 DIAGNOSIS — Z951 Presence of aortocoronary bypass graft: Secondary | ICD-10-CM

## 2011-06-22 DIAGNOSIS — D649 Anemia, unspecified: Secondary | ICD-10-CM

## 2011-06-22 DIAGNOSIS — E7800 Pure hypercholesterolemia, unspecified: Secondary | ICD-10-CM

## 2011-06-22 DIAGNOSIS — I2510 Atherosclerotic heart disease of native coronary artery without angina pectoris: Secondary | ICD-10-CM

## 2011-06-22 NOTE — Assessment & Plan Note (Signed)
The patient has not been expressing any recurrent angina pectoris. 

## 2011-06-22 NOTE — Patient Instructions (Signed)
Continue same medicines. Keep exercising.

## 2011-06-22 NOTE — Assessment & Plan Note (Signed)
The patient has not been having any hypoglycemic reactions.  Her diabetes is followed by her primary care physician

## 2011-06-22 NOTE — Progress Notes (Signed)
[name] Date of Birth:  1933-03-22 [facility] / [facility] [address]   [address] [place], [place]  [address] [phone]           Fax   [phone]  History of Present Illness: This pleasant 77 year old Caucasian female is seen for a scheduled followup office visit.  She has a history of ischemic heart disease.  She underwent coronary artery bypass graft surgery following an acute myocardial infarction and surgery was done on 12/22/06.  She has not had any subsequent angina pectoris.  She does have exertional dyspnea.  He had an echocardiogram on 06/04/09 which showed an ejection fraction of 55-60% and showed impaired relaxation.  She also has mild to moderate aortic stenosis and trace tricuspid regurgitation.  She had a nuclear stress test 08/13/09 using LexiScan and it showed a small area of mild ischemia in the inferolateral wall but her oral LV function was normal and there were no regional wall motion abnormalities.  This was felt to be consistent with a small old scar with mild peri-infarct ischemia.  The patient is diabetic.  She has not been having any hypoglycemic episodes she has hypercholesterolemia she's had a history of peripheral neuropathy.  Since we last saw her she had one of her cataracts operated upon by Dr. Elmer Picker and she is due to have the second one done in October.  Current Outpatient Prescriptions  Medication Sig Dispense Refill  . aspirin 81 MG tablet Take 81 mg by mouth daily.        . Ferrous Sulfate (IRON) 325 (65 FE) MG TABS Take 1 tablet by mouth 2 (two) times daily.        Marland Kitchen glyBURIDE (DIABETA) 5 MG tablet TAKE 1 TABLET TWICE DAILY  60 tablet  3  . levothyroxine (LEVOTHROID) 50 MCG tablet Take 1 tablet (50 mcg total) by mouth daily.  30 tablet  3  . losartan (COZAAR) 50 MG tablet Take 1 tablet (50 mg total) by mouth daily.  30 tablet  11  . metoprolol tartrate (LOPRESSOR) 25 MG tablet TAKE 1 TABLET TWICE A DAY  180 tablet  1  . Multiple  Vitamins-Minerals (CENTRUM CARDIO PO) Take by mouth 2 (two) times daily.        . rosuvastatin (CRESTOR) 10 MG tablet Half a tablet weekly...try to increase to two or three times a week if tolerated.  30 tablet  11  . traMADol (ULTRAM) 50 MG tablet Take 1 tablet (50 mg total) by mouth at bedtime.  30 tablet  1  . triamterene-hydrochlorothiazide (DYAZIDE) 37.5-25 MG per capsule TAKE 1 CAPSULE EVERY DAY  90 capsule  1    Allergies  Allergen Reactions  . Morphine And Related     Patient Active Problem List  Diagnoses  . DM (diabetes mellitus)  . Hypothyroidism  . High cholesterol  . HTN (hypertension)  . Iron deficiency anemia  . CAD (coronary artery disease)  . Breast cancer  . Uterine cancer  . Allergic rhinitis  . Peripheral neuropathy  . Dyspnea on exertion    History  Smoking status  . Never Smoker   Smokeless tobacco  . Not on file    History  Alcohol Use No    Family History  Problem Relation Age of Onset  . Cancer Mother     ? stomach cancer  . Diabetes Mother   . Heart disease Father 87  . Diabetes Brother   . Throat cancer Brother  Review of Systems: Constitutional: no fever chills diaphoresis or fatigue or change in weight.  Head and neck: no hearing loss, no epistaxis, no photophobia or visual disturbance. Respiratory: No cough, shortness of breath or wheezing. Cardiovascular: No chest pain peripheral edema, palpitations. Gastrointestinal: No abdominal distention, no abdominal pain, no change in bowel habits hematochezia or melena. Genitourinary: No dysuria, no frequency, no urgency, no nocturia. Musculoskeletal:No arthralgias, no back pain, no gait disturbance or myalgias. Neurological: No dizziness, no headaches, no numbness, no seizures, no syncope, no weakness, no tremors. Hematologic: No lymphadenopathy, no easy bruising. Psychiatric: No confusion, no hallucinations, no sleep disturbance.       Physical Exam: Filed Vitals:   06/22/11  1115  BP: 150/76  Pulse: 64  The general appearance reveals a well-developed well-nourished elderly woman in no distress.Pupils equal and reactive.   Extraocular Movements are full.  There is no scleral icterus.  The mouth and pharynx are normal.  The neck is supple.  The carotids reveal no bruits.  The jugular venous pressure is normal.  The thyroid is not enlarged.  There is no lymphadenopathy.  The chest is clear to percussion and auscultation. There are no rales or rhonchi. Expansion of the chest is symmetrical. The precordium is quiet.  The first heart sound is normal.  The second heart sound is physiologically split.  There is no murmur gallop rub or click.  There is no abnormal lift or heave.  The abdomen is soft and nontender. Bowel sounds are normal. The liver and spleen are not enlarged. There Are no abdominal masses. There are no bruits.  The pedal pulses are good.  There is no phlebitis or edema.  There is no cyanosis or clubbing.  Strength is normal and symmetrical in all extremities.  There is no lateralizing weakness.  There are no sensory deficits.  The skin is warm and dry.  There is no rash.      Assessment / Plan: Her weight is down only 1 pound since last visit.  She needs to work harder on diet weight loss and exercise.  Continue same medication

## 2011-06-22 NOTE — Assessment & Plan Note (Signed)
The patient has dyspnea on moderate exertion.  This has not really changed since last visit.  She is trying to get back into a walking program again after not walking much over the hot summer months.

## 2011-06-22 NOTE — Assessment & Plan Note (Signed)
The patient is on Crestor for her hypercholesterolemia.  She's not having any myalgias from the Crestor.

## 2011-06-25 LAB — POCT CARDIAC MARKERS
CKMB, poc: 1.2
CKMB, poc: 1.2

## 2011-06-25 LAB — CBC
HCT: 25.5 — ABNORMAL LOW
Hemoglobin: 7.9 — CL
Hemoglobin: 8.1 — ABNORMAL LOW
MCHC: 31.8
Platelets: 174
Platelets: 177
Platelets: 201
RBC: 3.42 — ABNORMAL LOW
RBC: 3.88
RDW: 23.9 — ABNORMAL HIGH
RDW: 24.8 — ABNORMAL HIGH
WBC: 6.2
WBC: 7.9

## 2011-06-25 LAB — POCT I-STAT, CHEM 8
BUN: 29 — ABNORMAL HIGH
Creatinine, Ser: 1.2
Glucose, Bld: 134 — ABNORMAL HIGH
Sodium: 139
TCO2: 22

## 2011-06-25 LAB — COMPREHENSIVE METABOLIC PANEL
BUN: 22
Calcium: 8.5
Glucose, Bld: 81
Total Protein: 5.2 — ABNORMAL LOW

## 2011-06-25 LAB — CULTURE, BLOOD (ROUTINE X 2)
Culture: NO GROWTH
Culture: NO GROWTH

## 2011-06-25 LAB — BASIC METABOLIC PANEL
BUN: 14
BUN: 20
Calcium: 8.6
Calcium: 9.1
Creatinine, Ser: 1.04
Creatinine, Ser: 1.05
GFR calc Af Amer: >60
GFR calc non Af Amer: 52 — ABNORMAL LOW
GFR calc non Af Amer: 52 — ABNORMAL LOW
Glucose, Bld: 107 — ABNORMAL HIGH
Sodium: 139

## 2011-06-25 LAB — DIFFERENTIAL
Basophils Relative: 1
Eosinophils Absolute: 0.1
Eosinophils Relative: 2
Lymphs Abs: 0.8
Monocytes Relative: 4

## 2011-06-25 LAB — TYPE AND SCREEN
ABO/RH(D): O POS
Antibody Screen: NEGATIVE

## 2011-06-25 LAB — FERRITIN: Ferritin: 24 (ref 10–291)

## 2011-06-25 LAB — URINE MICROSCOPIC-ADD ON

## 2011-06-25 LAB — URINE CULTURE: Colony Count: >=100000

## 2011-06-25 LAB — URINALYSIS, ROUTINE W REFLEX MICROSCOPIC
Bilirubin Urine: NEGATIVE
Glucose, UA: NEGATIVE
Hgb urine dipstick: NEGATIVE
Ketones, ur: NEGATIVE
pH: 6

## 2011-06-25 LAB — IRON AND TIBC
Iron: 20 — ABNORMAL LOW
Saturation Ratios: 5 — ABNORMAL LOW
TIBC: 399
UIBC: 275
UIBC: 355

## 2011-06-25 LAB — TROPONIN I: Troponin I: 0.01

## 2011-06-25 LAB — CARDIAC PANEL(CRET KIN+CKTOT+MB+TROPI): Total CK: 80

## 2011-06-25 LAB — CK TOTAL AND CKMB (NOT AT ARMC)
CK, MB: 1.7
Total CK: 76

## 2011-06-25 LAB — HEMOGLOBIN A1C: Hgb A1c MFr Bld: 4.8

## 2011-06-25 LAB — PREPARE RBC (CROSSMATCH)

## 2011-06-25 LAB — CLOTEST (H. PYLORI), BIOPSY: Helicobacter screen: NEGATIVE

## 2011-06-25 LAB — HEMOGLOBIN AND HEMATOCRIT, BLOOD: Hemoglobin: 8.5 — ABNORMAL LOW

## 2011-06-25 LAB — VITAMIN B12: Vitamin B-12: 745 (ref 211–911)

## 2011-08-13 ENCOUNTER — Ambulatory Visit: Payer: Medicare Other | Admitting: Family Medicine

## 2011-09-20 ENCOUNTER — Other Ambulatory Visit: Payer: Self-pay | Admitting: *Deleted

## 2011-09-20 DIAGNOSIS — E079 Disorder of thyroid, unspecified: Secondary | ICD-10-CM

## 2011-09-20 MED ORDER — LEVOTHYROXINE SODIUM 50 MCG PO TABS: 50.0000 ug | ORAL_TABLET | Freq: Every day | ORAL | Status: DC

## 2011-09-20 NOTE — Telephone Encounter (Signed)
Refilled levothyroxine.

## 2011-10-08 ENCOUNTER — Other Ambulatory Visit: Payer: Self-pay | Admitting: *Deleted

## 2011-10-08 MED ORDER — METOPROLOL TARTRATE 25 MG PO TABS: 25.0000 mg | ORAL_TABLET | Freq: Two times a day (BID) | ORAL | Status: DC

## 2011-10-30 ENCOUNTER — Other Ambulatory Visit: Payer: Self-pay | Admitting: Family Medicine

## 2011-11-01 ENCOUNTER — Other Ambulatory Visit: Payer: Self-pay | Admitting: *Deleted

## 2011-11-01 MED ORDER — TRIAMTERENE-HCTZ 37.5-25 MG PO CAPS: 1.0000 | ORAL_CAPSULE | Freq: Every day | ORAL | Status: DC

## 2011-11-01 NOTE — Telephone Encounter (Signed)
Refilled triamterene-hctz

## 2012-01-24 ENCOUNTER — Encounter: Payer: Self-pay | Admitting: Cardiology

## 2012-01-24 ENCOUNTER — Ambulatory Visit (INDEPENDENT_AMBULATORY_CARE_PROVIDER_SITE_OTHER): Payer: Medicare Other | Admitting: Cardiology

## 2012-01-24 ENCOUNTER — Telehealth: Payer: Self-pay | Admitting: Cardiology

## 2012-01-24 VITALS — BP 120/70 | HR 70 | Ht 63.0 in | Wt 179.0 lb

## 2012-01-24 DIAGNOSIS — D509 Iron deficiency anemia, unspecified: Secondary | ICD-10-CM

## 2012-01-24 DIAGNOSIS — R0600 Dyspnea, unspecified: Secondary | ICD-10-CM

## 2012-01-24 DIAGNOSIS — I2510 Atherosclerotic heart disease of native coronary artery without angina pectoris: Secondary | ICD-10-CM

## 2012-01-24 DIAGNOSIS — M79622 Pain in left upper arm: Secondary | ICD-10-CM

## 2012-01-24 DIAGNOSIS — L299 Pruritus, unspecified: Secondary | ICD-10-CM

## 2012-01-24 DIAGNOSIS — R0609 Other forms of dyspnea: Secondary | ICD-10-CM

## 2012-01-24 DIAGNOSIS — M79609 Pain in unspecified limb: Secondary | ICD-10-CM

## 2012-01-24 DIAGNOSIS — R5383 Other fatigue: Secondary | ICD-10-CM | POA: Insufficient documentation

## 2012-01-24 MED ORDER — NITROGLYCERIN 0.4 MG SL SUBL: 0.4000 mg | SUBLINGUAL_TABLET | SUBLINGUAL | Status: DC | PRN

## 2012-01-24 MED ORDER — NYSTATIN 100000 UNIT/GM EX POWD: CUTANEOUS | Status: DC

## 2012-01-24 NOTE — Assessment & Plan Note (Signed)
She has had increased fatigue for the past 2 weeks.  This could be an anginal equivalent.  We're also checking CBC, basal metabolic panel, and TSH today

## 2012-01-24 NOTE — Assessment & Plan Note (Signed)
She is not dyspneic at rest but is dyspneic with exertion

## 2012-01-24 NOTE — Progress Notes (Signed)
[name] Date of Birth:  [birth year] [facility] [address] [place], [place]  [address] [phone]         Fax   [phone]  History of Present Illness: This pleasant 78 year old woman is seen as a work in the office visit.  He comes in today because of shortness of breath and fatigue and left arm pain.  He has not been expressing any chest pain.  He has not taken any nitroglycerin for left arm pain.  She is a diabetic.  Her blood sugars recently have been running in the 200s.  She has a past history of blood loss anemia.  She is on iron.  She has been having chronically dark stools.  He had a nuclear stress test in November 2010 prior to hip surgery and at that time was cleared for hip surgery.  Current Outpatient Prescriptions  Medication Sig Dispense Refill  . aspirin 81 MG tablet Take 81 mg by mouth daily.        . Ferrous Sulfate (IRON) 325 (65 FE) MG TABS Take 1 tablet by mouth 2 (two) times daily.        Marland Kitchen glyBURIDE (DIABETA) 5 MG tablet TAKE 1 TABLET TWICE DAILY  60 tablet  3  . levothyroxine (LEVOTHROID) 50 MCG tablet Take 1 tablet (50 mcg total) by mouth daily.  30 tablet  5  . losartan (COZAAR) 50 MG tablet Take 1 tablet (50 mg total) by mouth daily.  30 tablet  11  . metoprolol tartrate (LOPRESSOR) 25 MG tablet Take 1 tablet (25 mg total) by mouth 2 (two) times daily.  180 tablet  3  . Multiple Vitamins-Minerals (CENTRUM CARDIO PO) Take by mouth 2 (two) times daily.        . rosuvastatin (CRESTOR) 10 MG tablet Half a tablet weekly...try to increase to two or three times a week if tolerated.  30 tablet  11  . traMADol (ULTRAM) 50 MG tablet Take 1 tablet (50 mg total) by mouth at bedtime.  30 tablet  1  . triamterene-hydrochlorothiazide (DYAZIDE) 37.5-25 MG per capsule Take 1 each (1 capsule total) by mouth daily.  90 capsule  1  . nitroGLYCERIN (NITROSTAT) 0.4 MG SL tablet Place 1 tablet (0.4 mg total) under the tongue every 5 (five) minutes as  needed for chest pain.  25 tablet  3  . nystatin (NYSTOP) 100000 UNIT/GM POWD As directed  15 g  0    Allergies  Allergen Reactions  . Morphine And Related     Patient Active Problem List  Diagnoses  . DM (diabetes mellitus)  . Hypothyroidism  . High cholesterol  . HTN (hypertension)  . Iron deficiency anemia  . CAD (coronary artery disease)  . Breast cancer  . Uterine cancer  . Allergic rhinitis  . Peripheral neuropathy  . Dyspnea on exertion    History  Smoking status  . Never Smoker   Smokeless tobacco  . Not on file    History  Alcohol Use No    Family History  Problem Relation Age of Onset  . Cancer Mother     ? stomach cancer  . Diabetes Mother   . Heart disease Father 8  . Diabetes Brother   . Throat cancer Brother     Review of Systems: Constitutional: no fever chills diaphoresis or fatigue or change in weight.  Head and neck: no hearing loss, no epistaxis, no photophobia or visual disturbance. Respiratory: No cough, shortness of  breath or wheezing. Cardiovascular: No chest pain peripheral edema, palpitations. Gastrointestinal: No abdominal distention, no abdominal pain, no change in bowel habits hematochezia or melena. Genitourinary: No dysuria, no frequency, no urgency, no nocturia. Musculoskeletal:No arthralgias, no back pain, no gait disturbance or myalgias. Neurological: No dizziness, no headaches, no numbness, no seizures, no syncope, no weakness, no tremors. Hematologic: No lymphadenopathy, no easy bruising. Psychiatric: No confusion, no hallucinations, no sleep disturbance.    Physical Exam: Filed Vitals:   01/24/12 1426  BP: 120/70  Pulse: 70   the general appearance reveals a pale elderly woman in no acute distress.Pupils equal and reactive.   Extraocular Movements are full.  There is no scleral icterus.  The mouth and pharynx are normal.  The neck is supple.  The carotids reveal no bruits.  The jugular venous pressure is normal.  The  thyroid is not enlarged.  There is no lymphadenopathy.  The chest is clear to percussion and auscultation. There are no rales or rhonchi. Expansion of the chest is symmetrical.  The right breast is surgically absent.  The heart reveals a soft systolic ejection murmur at the base.The abdomen is soft and nontender. Bowel sounds are normal. The liver and spleen are not enlarged. There Are no abdominal masses. There are no bruits.  The pedal pulses are good.  There is no phlebitis or edema.  There is no cyanosis or clubbing. Strength is normal and symmetrical in all extremities.  There is no lateralizing weakness.  There are no sensory deficits.  Integument appears pale.  She has had no diaphoresis  EKG today shows normal sinus rhythm and nonspecific T-wave flattening but no acute ischemic changes   Assessment / Plan: Continue on same medication.  She may use a trial of nitroglycerin for left arm pain and we called in.  She will return soon for a nuclear stress test.  Blood work is pending today

## 2012-01-24 NOTE — Patient Instructions (Addendum)
Your physician has requested that you have a lexiscan myoview. For further information please visit [email or web address] Please follow instruction sheet, as given.  Rx for Nitrostat sent to pharmacy to use as needed  Will obtain labs today and call you with the results (cbc/bmet/tsh)  If worse or no better call back or go to the emergency department  Return as needed

## 2012-01-24 NOTE — Telephone Encounter (Signed)
Coming in today at 2:15 to see  Dr. Patty Sermons, advised patient

## 2012-01-24 NOTE — Telephone Encounter (Signed)
New Problem:     Patient called in wanting to be seen today because she has had trouble breathing all weekend and today she is experiencing pain in her left arm.  Please call back.

## 2012-01-24 NOTE — Assessment & Plan Note (Signed)
The patient appears to have increased pallor since we last saw her.  Her daughter who came with her also thought that she has been looking pale.  We're checking lab work today including a CBC to be sure anemia is not playing a significant role.

## 2012-01-24 NOTE — Assessment & Plan Note (Signed)
Patient has been experiencing intermittent left arm discomfort for the past several weeks.  This could be an anginal equivalent and we will plan to update her Lexus scan Myoview to evaluate further

## 2012-01-24 NOTE — Telephone Encounter (Signed)
Received call from patient stating since this past Friday she is having sob,unable to walk from room to room.Also complaining of tires easy and left arm pain,no chest pain.Message fowarded to Dr.Brackbill's nurse.

## 2012-01-25 ENCOUNTER — Ambulatory Visit (HOSPITAL_COMMUNITY): Payer: Medicare Other | Attending: Cardiology | Admitting: Radiology

## 2012-01-25 VITALS — BP 136/53 | Ht 63.0 in | Wt 179.0 lb

## 2012-01-25 DIAGNOSIS — I252 Old myocardial infarction: Secondary | ICD-10-CM | POA: Insufficient documentation

## 2012-01-25 DIAGNOSIS — E785 Hyperlipidemia, unspecified: Secondary | ICD-10-CM | POA: Insufficient documentation

## 2012-01-25 DIAGNOSIS — R0989 Other specified symptoms and signs involving the circulatory and respiratory systems: Secondary | ICD-10-CM | POA: Insufficient documentation

## 2012-01-25 DIAGNOSIS — Z8249 Family history of ischemic heart disease and other diseases of the circulatory system: Secondary | ICD-10-CM | POA: Insufficient documentation

## 2012-01-25 DIAGNOSIS — R0602 Shortness of breath: Secondary | ICD-10-CM

## 2012-01-25 DIAGNOSIS — R5383 Other fatigue: Secondary | ICD-10-CM | POA: Insufficient documentation

## 2012-01-25 DIAGNOSIS — E663 Overweight: Secondary | ICD-10-CM | POA: Insufficient documentation

## 2012-01-25 DIAGNOSIS — E119 Type 2 diabetes mellitus without complications: Secondary | ICD-10-CM | POA: Insufficient documentation

## 2012-01-25 DIAGNOSIS — M79609 Pain in unspecified limb: Secondary | ICD-10-CM | POA: Insufficient documentation

## 2012-01-25 DIAGNOSIS — I2510 Atherosclerotic heart disease of native coronary artery without angina pectoris: Secondary | ICD-10-CM | POA: Insufficient documentation

## 2012-01-25 DIAGNOSIS — Z951 Presence of aortocoronary bypass graft: Secondary | ICD-10-CM | POA: Insufficient documentation

## 2012-01-25 DIAGNOSIS — I10 Essential (primary) hypertension: Secondary | ICD-10-CM | POA: Insufficient documentation

## 2012-01-25 DIAGNOSIS — R42 Dizziness and giddiness: Secondary | ICD-10-CM | POA: Insufficient documentation

## 2012-01-25 DIAGNOSIS — R5381 Other malaise: Secondary | ICD-10-CM | POA: Insufficient documentation

## 2012-01-25 DIAGNOSIS — R000 Tachycardia, unspecified: Secondary | ICD-10-CM | POA: Insufficient documentation

## 2012-01-25 DIAGNOSIS — I209 Angina pectoris, unspecified: Secondary | ICD-10-CM

## 2012-01-25 DIAGNOSIS — R0609 Other forms of dyspnea: Secondary | ICD-10-CM | POA: Insufficient documentation

## 2012-01-25 LAB — CBC WITH DIFFERENTIAL/PLATELET
Basophils Relative: 0.2 % (ref 0.0–3.0)
Eosinophils Relative: 7 % — ABNORMAL HIGH (ref 0.0–5.0)
HCT: 23.3 % — CL (ref 36.0–46.0)
Lymphs Abs: 1.4 10*3/uL (ref 0.7–4.0)
MCV: 94.4 fl (ref 78.0–100.0)
Monocytes Absolute: 0.3 10*3/uL (ref 0.1–1.0)
Monocytes Relative: 3.4 % (ref 3.0–12.0)
Neutrophils Relative %: 75.7 % (ref 43.0–77.0)
Platelets: 210 10*3/uL (ref 150.0–400.0)
RBC: 2.47 Mil/uL — ABNORMAL LOW (ref 3.87–5.11)
WBC: 9.9 10*3/uL (ref 4.5–10.5)

## 2012-01-25 LAB — BASIC METABOLIC PANEL
Chloride: 106 mEq/L (ref 96–112)
GFR: 32.54 mL/min — ABNORMAL LOW (ref >60.00)
Potassium: 4.4 mEq/L (ref 3.5–5.1)

## 2012-01-25 LAB — TSH: TSH: 1.32 u[IU]/mL (ref 0.35–5.50)

## 2012-01-25 MED ORDER — TECHNETIUM TC 99M TETROFOSMIN IV KIT
11.0000 | PACK | Freq: Once | INTRAVENOUS | Status: AC | PRN
  Administered 2012-01-25: 11 via INTRAVENOUS

## 2012-01-25 MED ORDER — REGADENOSON 0.4 MG/5ML IV SOLN
0.4000 mg | Freq: Once | INTRAVENOUS | Status: AC
  Administered 2012-01-25: 0.4 mg via INTRAVENOUS

## 2012-01-25 MED ORDER — TECHNETIUM TC 99M TETROFOSMIN IV KIT
33.0000 | PACK | Freq: Once | INTRAVENOUS | Status: AC | PRN
  Administered 2012-01-25: 33 via INTRAVENOUS

## 2012-01-25 NOTE — Progress Notes (Signed)
[facility] 3 NUCLEAR MED [address] [place] [address] [phone]  Cardiology Nuclear Med Study  [name] is a 78 y.o. female     MRN : [id number]     DOB: [birth year]  Procedure Date: 01/25/2012  Nuclear Med Background Indication for Stress Test:  Evaluation for Ischemia and Graft Patency History:  '08 MI>CABG; '10 Echo:EF=55-60%; '10 OZH:YQMVH mild infero-lateral scar with mild peri-infarct ischemia, EF=69%. Cardiac Risk Factors: Family History - CAD, Hypertension, Lipids, NIDDM and Overweight  Symptoms:  (L) Arm Pain (last episode of arm pain was yesterday), Dizziness, DOE, Fatigue and Rapid HR   Nuclear Pre-Procedure Caffeine/Decaff Intake:  8:00pm NPO After: 8:00pm   Lungs:  Clear. O2 Sat: 99% on room air. IV 0.9% NS with Angio Cath:  22g  IV Site: L Hand  IV Started by:  Cathlyn Parsons, RN  Chest Size (in):  42 Cup Size: B  Height: 5\' 3"  (1.6 m)  Weight:  179 lb (81.194 kg)  BMI:  Body mass index is 31.71 kg/(m^2). Tech Comments:  Lopressor held x 12 hours    Nuclear Med Study 1 or 2 day study: 1 day  Stress Test Type:  Lexiscan  Reading MD: Olga Millers, MD  Order Authorizing Provider:  Cassell Clement, MD  Resting Radionuclide: Technetium 64m Tetrofosmin  Resting Radionuclide Dose: 11.0 mCi   Stress Radionuclide:  Technetium 79m Tetrofosmin  Stress Radionuclide Dose: 33.0 mCi           Stress Protocol Rest HR: 64 Stress HR: 93  Rest BP: 136/53 Stress BP: 137/58  Exercise Time (min): n/a METS: n/a   Predicted Max HR: 144 bpm % Max HR: 64.58 bpm Rate Pressure Product: 84696   Dose of Adenosine (mg):  n/a Dose of Lexiscan: 0.4 mg  Dose of Atropine (mg): n/a Dose of Dobutamine: n/a mcg/kg/min (at max HR)  Stress Test Technologist: Smiley Houseman, CMA-N  Nuclear Technologist:  Domenic Polite, CNMT     Rest Procedure:  Myocardial perfusion imaging was performed at rest 45 minutes following the intravenous  administration of Technetium 81m Tetrofosmin.  Rest ECG: Nonspecific T-wave flattening, no acute changes.  Stress Procedure:  The patient received IV Lexiscan 0.4 mg over 15-seconds.  Technetium 37m Tetrofosmin injected at 30-seconds.  There were no significant changes with Lexiscan, occasional PVC's were noted.  Quantitative spect images were obtained after a 45 minute delay.  Stress ECG: No significant ST segment change suggestive of ischemia.  QPS Raw Data Images:  Acquisition technically good; normal left ventricular size. Stress Images:  There is decreased uptake in the inferolateral wall. Rest Images:  There is decreased uptake in the inferolateral wall. Subtraction (SDS):  No evidence of ischemia. Transient Ischemic Dilatation (Normal <1.22):  0.95 Lung/Heart Ratio (Normal <0.45):  0.32  Quantitative Gated Spect Images QGS EDV:  68 ml QGS ESV:  19 ml  Impression Exercise Capacity:  Lexiscan with no exercise. BP Response:  Normal blood pressure response. Clinical Symptoms:  No chest pain or dyspnea ECG Impression:  No significant ST segment change suggestive of ischemia. Comparison with Prior Nuclear Study: Mild inferolateral ischemia absent compared to previous.  Overall Impression:  Abnormal stress nuclear study with a small, mild, fixed inferolateral defect consistent with small prior infarct; no ischemia.  LV Ejection Fraction: 72%.  LV Wall Motion:  NL LV Function; NL Wall Motion  Olga Millers

## 2012-01-26 ENCOUNTER — Encounter: Payer: Self-pay | Admitting: Family Medicine

## 2012-01-26 ENCOUNTER — Telehealth: Payer: Self-pay | Admitting: *Deleted

## 2012-01-26 ENCOUNTER — Ambulatory Visit (INDEPENDENT_AMBULATORY_CARE_PROVIDER_SITE_OTHER): Payer: Medicare Other | Admitting: Family Medicine

## 2012-01-26 VITALS — BP 120/60 | HR 79 | Temp 97.9°F | Ht 63.0 in | Wt 179.0 lb

## 2012-01-26 DIAGNOSIS — R5381 Other malaise: Secondary | ICD-10-CM

## 2012-01-26 DIAGNOSIS — I2510 Atherosclerotic heart disease of native coronary artery without angina pectoris: Secondary | ICD-10-CM

## 2012-01-26 DIAGNOSIS — D509 Iron deficiency anemia, unspecified: Secondary | ICD-10-CM

## 2012-01-26 DIAGNOSIS — D649 Anemia, unspecified: Secondary | ICD-10-CM

## 2012-01-26 DIAGNOSIS — R0989 Other specified symptoms and signs involving the circulatory and respiratory systems: Secondary | ICD-10-CM

## 2012-01-26 NOTE — Telephone Encounter (Signed)
Message copied by Burnell Blanks on Wed [name] 26, 2012 11:13 AM ------      Message from: Cassell Clement      Created: Tue [name] 25, 2012  8:38 PM       Please report. BS 277 too high. Kidneys show decreased function possibly due to the severe anemia. Drink plenty of water.            WBC normal but Hgb very low 7.8.  Referred to her primary for further workup and management of the anemia.            Thyroid normal.

## 2012-01-26 NOTE — Progress Notes (Signed)
  Patient Name: [name] Date of Birth: October 06, 1932 Age: 78 y.o. Medical Record Number: [id number] Gender: female Date of Encounter: 01/26/2012  History of Present Illness:  [name] is a 78 y.o. very pleasant female patient who presents with the following:  Very pleasant elderly female accompanied by her daughter who is here today in followup after seeing the cardiologist yesterday. She has been feeling very poorly and weak as well as having some shortness of breath and fatigue. She was found to have a low hemoglobin at 7.8. She has not had any bleeding that she knows of. No bleeding with urination and no bleeding per her bowels.  Her last colonoscopy in August of 2009 had only a diminutive polyp. She did have some gastritis on upper endoscopy. Of note, the patient has been taking some Aleve recently.  Hgb quite low: Fatigue, feeling really bad. Has been extremely low in the past  CBC:    Component Value Date/Time   WBC 9.9 01/24/2012 1527   HGB 7.8 cL* 01/24/2012 1527   HCT 23.3* 01/24/2012 1527   PLT 210.0 01/24/2012 1527   MCV 94.4 01/24/2012 1527   NEUTROABS 7.5 01/24/2012 1527   LYMPHSABS 1.4 01/24/2012 1527   MONOABS 0.3 01/24/2012 1527   EOSABS 0.7 01/24/2012 1527   BASOSABS 0.0 01/24/2012 1527     Past Medical History, Surgical History, Social History, Family History, Problem List, Medications, and Allergies have been reviewed and updated if relevant.  Review of Systems: Fatigue, shortness of breath, pallor.  Physical Examination: Filed Vitals:   01/26/12 1140  BP: 120/60  Pulse: 79  Temp: 97.9 F (36.6 C)  TempSrc: Oral  Height: 5\' 3"  (1.6 m)  Weight: 179 lb (81.194 kg)  SpO2: 99%    Body mass index is 31.71 kg/(m^2).   GEN: WDWN, NAD, Non-toxic, A & O x 3 HEENT: Atraumatic, Normocephalic. Neck supple. No masses, No LAD. Conjunctival pallor. Ears and Nose: No external deformity. CV: RRR, No M/G/R. No JVD. No thrill. No extra heart sounds. PULM:  CTA B, no wheezes, crackles, rhonchi. No retractions. No resp. distress. No accessory muscle use. EXTR: No c/c/e NEURO Normal gait.  PSYCH: Normally interactive. Conversant. Not depressed or anxious appearing.  Calm demeanor.    Assessment and Plan:  1. Anemia    2. Anemia, iron deficiency  Fecal occult blood, imunochemical  3. Iron deficiency anemia    4. CAD (coronary artery disease)    5. Malaise and fatigue    6. Dyspnea on exertion     Hemoglobin is 7.8 in light of a patient with coronary disease, status post CABG Where did arrange for her to get a transfusion of 2 units of packed red blood cells.  She is going to do a fecal occult blood test and send in If pos, will need further GI eval If neg, I would like to get heme input.  Orders Today: Orders Placed This Encounter  Procedures  . Fecal occult blood, imunochemical    Medications Today: No orders of the defined types were placed in this encounter.

## 2012-01-26 NOTE — Patient Instructions (Signed)
REFERRAL: GO THE THE FRONT ROOM AT THE ENTRANCE OF OUR CLINIC, NEAR CHECK IN. ASK FOR MARION. SHE WILL HELP YOU SET UP YOUR REFERRAL. DATE: TIME:  

## 2012-01-26 NOTE — Telephone Encounter (Signed)
Advised daughter and she will call back with update after today's visit with Dr Dallas Schimke.

## 2012-01-26 NOTE — Telephone Encounter (Signed)
Message copied by Burnell Blanks on Wed [name] 26, 2012 11:10 AM ------      Message from: Cassell Clement      Created: Wed [name] 26, 2012 11:05 AM       Please report. The stress test does not show any ischemia. There is an old small scar as before. We feel that her present symptoms of weakness and left arm pain are most likely related to her worsening anemia.

## 2012-01-27 ENCOUNTER — Other Ambulatory Visit (HOSPITAL_COMMUNITY): Payer: Self-pay | Admitting: *Deleted

## 2012-01-27 NOTE — Progress Notes (Signed)
Addended by: Baldomero Lamy on: 01/27/2012 09:01 AM   Modules accepted: Orders

## 2012-01-28 ENCOUNTER — Encounter (HOSPITAL_COMMUNITY)
Admission: RE | Admit: 2012-01-28 | Discharge: 2012-01-28 | Disposition: A | Discharge status: Home / Self Care | Payer: Medicare Other | Source: Ambulatory Visit | Attending: Family Medicine | Admitting: Family Medicine

## 2012-01-28 DIAGNOSIS — D649 Anemia, unspecified: Secondary | ICD-10-CM | POA: Insufficient documentation

## 2012-01-28 LAB — PREPARE RBC (CROSSMATCH)

## 2012-01-28 LAB — GLUCOSE, CAPILLARY: Glucose-Capillary: 233 mg/dL — ABNORMAL HIGH (ref 70–99)

## 2012-01-28 MED ORDER — FUROSEMIDE 10 MG/ML IJ SOLN
20.0000 mg | Freq: Once | INTRAMUSCULAR | Status: AC
  Administered 2012-01-28: 20 mg via INTRAVENOUS
  Filled 2012-01-28: qty 2

## 2012-01-29 ENCOUNTER — Other Ambulatory Visit: Payer: Self-pay | Admitting: Family Medicine

## 2012-01-29 LAB — TYPE AND SCREEN
ABO/RH(D): O POS
Antibody Screen: NEGATIVE
Unit division: 0
Unit division: 0

## 2012-02-08 ENCOUNTER — Other Ambulatory Visit: Payer: Medicare Other

## 2012-02-08 DIAGNOSIS — I2510 Atherosclerotic heart disease of native coronary artery without angina pectoris: Secondary | ICD-10-CM

## 2012-02-08 DIAGNOSIS — D649 Anemia, unspecified: Secondary | ICD-10-CM

## 2012-02-08 DIAGNOSIS — D509 Iron deficiency anemia, unspecified: Secondary | ICD-10-CM

## 2012-02-08 DIAGNOSIS — Z1211 Encounter for screening for malignant neoplasm of colon: Secondary | ICD-10-CM

## 2012-02-09 ENCOUNTER — Encounter: Payer: Self-pay | Admitting: *Deleted

## 2012-03-17 ENCOUNTER — Telehealth: Payer: Self-pay | Admitting: Family Medicine

## 2012-03-17 DIAGNOSIS — D509 Iron deficiency anemia, unspecified: Secondary | ICD-10-CM

## 2012-03-17 NOTE — Telephone Encounter (Signed)
Pt had blood work done last time she was here and found out that her blood count was so low that she needed a blood transfusion. She was wondering if she could come in next Tuesday to get those blood levels checked again to make sure everything was still ok and see you Friday after the blood work.

## 2012-03-17 NOTE — Telephone Encounter (Signed)
Agreed -

## 2012-03-17 NOTE — Telephone Encounter (Signed)
Patient advised via message on machine b/c its Friday afternoon

## 2012-03-21 ENCOUNTER — Other Ambulatory Visit (INDEPENDENT_AMBULATORY_CARE_PROVIDER_SITE_OTHER): Payer: Medicare Other

## 2012-03-21 DIAGNOSIS — E039 Hypothyroidism, unspecified: Secondary | ICD-10-CM

## 2012-03-21 DIAGNOSIS — I10 Essential (primary) hypertension: Secondary | ICD-10-CM

## 2012-03-21 DIAGNOSIS — E119 Type 2 diabetes mellitus without complications: Secondary | ICD-10-CM

## 2012-03-21 DIAGNOSIS — E7800 Pure hypercholesterolemia, unspecified: Secondary | ICD-10-CM

## 2012-03-21 DIAGNOSIS — D509 Iron deficiency anemia, unspecified: Secondary | ICD-10-CM

## 2012-03-21 LAB — COMPREHENSIVE METABOLIC PANEL
AST: 16 U/L (ref 0–37)
Albumin: 3.4 g/dL — ABNORMAL LOW (ref 3.5–5.2)
Alkaline Phosphatase: 79 U/L (ref 39–117)
BUN: 44 mg/dL — ABNORMAL HIGH (ref 6–23)
Glucose, Bld: 241 mg/dL — ABNORMAL HIGH (ref 70–99)
Potassium: 4 mEq/L (ref 3.5–5.1)
Sodium: 139 mEq/L (ref 135–145)
Total Bilirubin: 0.4 mg/dL (ref 0.3–1.2)
Total Protein: 6.7 g/dL (ref 6.0–8.3)

## 2012-03-21 LAB — CBC WITH DIFFERENTIAL/PLATELET
Basophils Absolute: 0.1 10*3/uL (ref 0.0–0.1)
Eosinophils Absolute: 0.6 10*3/uL (ref 0.0–0.7)
Lymphocytes Relative: 22.3 % (ref 12.0–46.0)
MCHC: 33.6 g/dL (ref 30.0–36.0)
MCV: 90.9 fl (ref 78.0–100.0)
Monocytes Absolute: 0.3 10*3/uL (ref 0.1–1.0)
Neutrophils Relative %: 59.1 % (ref 43.0–77.0)
Platelets: 164 10*3/uL (ref 150.0–400.0)

## 2012-03-21 LAB — LIPID PANEL
Cholesterol: 280 mg/dL — ABNORMAL HIGH (ref 0–200)
Total CHOL/HDL Ratio: 6
Triglycerides: 576 mg/dL — ABNORMAL HIGH (ref 0.0–149.0)
VLDL: 115.2 mg/dL — ABNORMAL HIGH (ref 0.0–40.0)

## 2012-03-21 LAB — IBC PANEL
Iron: 52 ug/dL (ref 42–145)
Saturation Ratios: 15.1 % — ABNORMAL LOW (ref 20.0–50.0)
Transferrin: 246.3 mg/dL (ref 212.0–360.0)

## 2012-03-21 LAB — TSH: TSH: 1.74 u[IU]/mL (ref 0.35–5.50)

## 2012-03-24 ENCOUNTER — Encounter: Payer: Self-pay | Admitting: Family Medicine

## 2012-03-24 ENCOUNTER — Ambulatory Visit (INDEPENDENT_AMBULATORY_CARE_PROVIDER_SITE_OTHER): Payer: Medicare Other | Admitting: Family Medicine

## 2012-03-24 VITALS — BP 150/78 | HR 84 | Temp 98.8°F | Wt 182.2 lb

## 2012-03-24 DIAGNOSIS — N289 Disorder of kidney and ureter, unspecified: Secondary | ICD-10-CM

## 2012-03-24 DIAGNOSIS — G629 Polyneuropathy, unspecified: Secondary | ICD-10-CM

## 2012-03-24 DIAGNOSIS — E7800 Pure hypercholesterolemia, unspecified: Secondary | ICD-10-CM

## 2012-03-24 DIAGNOSIS — D509 Iron deficiency anemia, unspecified: Secondary | ICD-10-CM

## 2012-03-24 DIAGNOSIS — E039 Hypothyroidism, unspecified: Secondary | ICD-10-CM

## 2012-03-24 DIAGNOSIS — E119 Type 2 diabetes mellitus without complications: Secondary | ICD-10-CM

## 2012-03-24 DIAGNOSIS — G609 Hereditary and idiopathic neuropathy, unspecified: Secondary | ICD-10-CM

## 2012-03-24 DIAGNOSIS — I10 Essential (primary) hypertension: Secondary | ICD-10-CM

## 2012-03-24 MED ORDER — ATORVASTATIN CALCIUM 40 MG PO TABS: 40.0000 mg | ORAL_TABLET | Freq: Every day | ORAL | Status: DC

## 2012-03-24 NOTE — Assessment & Plan Note (Addendum)
Improved S/P transfusion and with three tabs daily of iron. Iron in nml ranges. Offered referral to heme for consideration of erythropoetin or other work up. Pt decided if Hg trending back down we can refer.

## 2012-03-24 NOTE — Assessment & Plan Note (Signed)
At goal on current med dose.

## 2012-03-24 NOTE — Progress Notes (Signed)
Subjective:    Patient ID: [name], female    DOB: [birth year], 78 y.o.   MRN: [id number]  HPI Diabetes: poor control. On glyburide Lab Results  Component Value Date   HGBA1C 9.9* 03/21/2012  Using medications without difficulties:Yes Hypoglycemic episodes:? Hyperglycemic episodes:? Feet problems:At bedtime, burning in feet. If not improving at next OV consider gabapentin. Blood Sugars averaging:Not checking.  She reports her diet is very poor eating  A lot of chocolate.  eye exam within last year:   Anemia: Went to hospital for low hemoglobin in 04/2011. No clear source.   Transfusion again for Hg 7.8 in 5.2013. On ferrous sulfate  3 tabs daily now for 1 month. Hemeoccult cards negative. She has not had any bleeding that she knows of. No bleeding with urination and no bleeding per her bowels.  Her last colonoscopy in August of 2009 had only a diminutive polyp. She did have some gastritis on upper endoscopy. Of note, the patient has been taking some Aleve recently.  Tolerating the 3 tabs a day of iron.  Hypertension:  Inadequate control on metoprolol, dyazide She reports she has been very anxious about going to the doctor. Using medication without problems or lightheadedness: None Chest pain with exertion:None Edema:None Short of breath:None Average home BPs:Not checking Other issues:  Elevated Cholesterol: On crestor 1/2 twice a week because expensive Using medications without problems:None Muscle aches:  None Diet compliance:Poor Exercise:None Other complaints:  Hypothyroid  Stable on levo. Lab Results  Component Value Date   TSH 1.74 03/21/2012     Review of Systems  Constitutional: Negative for fever and fatigue.  HENT: Negative for ear pain.   Eyes: Negative for pain.  Respiratory: Negative for chest tightness and shortness of breath.   Cardiovascular: Negative for chest pain, palpitations and leg swelling.  Gastrointestinal: Negative for abdominal pain.    Genitourinary: Negative for dysuria.       Objective:   Physical Exam  Constitutional: Vital signs are normal. She appears well-developed and well-nourished. She is cooperative.  Non-toxic appearance. She does not appear ill. No distress.  HENT:  Head: Normocephalic.  Right Ear: Hearing, tympanic membrane, external ear and ear canal normal. Tympanic membrane is not erythematous, not retracted and not bulging.  Left Ear: Hearing, tympanic membrane, external ear and ear canal normal. Tympanic membrane is not erythematous, not retracted and not bulging.  Nose: No mucosal edema or rhinorrhea. Right sinus exhibits no maxillary sinus tenderness and no frontal sinus tenderness. Left sinus exhibits no maxillary sinus tenderness and no frontal sinus tenderness.  Mouth/Throat: Uvula is midline, oropharynx is clear and moist and mucous membranes are normal.  Eyes: Conjunctivae, EOM and lids are normal. Pupils are equal, round, and reactive to light. No foreign bodies found.  Neck: Trachea normal and normal range of motion. Neck supple. Carotid bruit is not present. No mass and no thyromegaly present.  Cardiovascular: Normal rate, regular rhythm, S1 normal, S2 normal, normal heart sounds, intact distal pulses and normal pulses.  Exam reveals no gallop and no friction rub.   No murmur heard. Pulmonary/Chest: Effort normal and breath sounds normal. Not tachypneic. No respiratory distress. She has no decreased breath sounds. She has no wheezes. She has no rhonchi. She has no rales.  Abdominal: Soft. Normal appearance and bowel sounds are normal. There is no tenderness.  Neurological: She is alert.  Skin: Skin is warm, dry and intact. No rash noted.  Psychiatric: Her speech is normal and behavior is normal.  Judgment and thought content normal. Her mood appears not anxious. Cognition and memory are normal. She does not exhibit a depressed mood.     Diabetic foot exam: Normal inspection No skin  breakdown No calluses  Normal DP pulses Diminished sensation to light touch and monofilament Nails normal      Assessment & Plan:

## 2012-03-24 NOTE — Assessment & Plan Note (Signed)
Stop crestor due to cost. Change to EVERYDAY atorvastatin. Hopefully will lower trig as well as get LDL to goal <100. Encouraged exercise, weight loss, healthy eating habits.

## 2012-03-24 NOTE — Patient Instructions (Addendum)
Continue three tabs daily of iron. Decrease chocolate. Stop caramel frappes Stop crestor.. change to  Atorvastatin (lipitor) 40 mg daily. Consider checking BP at home.. Goal <130/80.  Return in 3 months with labs prior.

## 2012-03-24 NOTE — Assessment & Plan Note (Addendum)
Worsened control in last year along with worsened DM control. Follow with recheck. ON ARB. MAy be contributing to anemia

## 2012-03-24 NOTE — Assessment & Plan Note (Signed)
Pt feels this is due to white coat HTN and anxiety. Follow at home. Recheck at next OV.

## 2012-03-24 NOTE — Assessment & Plan Note (Signed)
If not improving with improvement of sugars.. Consider starting gabapentin.

## 2012-03-24 NOTE — Assessment & Plan Note (Signed)
Worsening in last year due to very poor diet. Pt will get back on track. Will recheck inn 3 months. Offered nutrition referral.. Pt will hold off unless not better at next follow up,.

## 2012-03-31 ENCOUNTER — Telehealth: Payer: Self-pay

## 2012-03-31 ENCOUNTER — Ambulatory Visit (INDEPENDENT_AMBULATORY_CARE_PROVIDER_SITE_OTHER): Payer: Medicare Other | Admitting: Family Medicine

## 2012-03-31 ENCOUNTER — Encounter: Payer: Self-pay | Admitting: Family Medicine

## 2012-03-31 VITALS — BP 120/74 | HR 98 | Temp 98.7°F | Ht 63.0 in | Wt 177.0 lb

## 2012-03-31 DIAGNOSIS — J209 Acute bronchitis, unspecified: Secondary | ICD-10-CM

## 2012-03-31 MED ORDER — GUAIFENESIN-CODEINE 100-10 MG/5ML PO SYRP: 5.0000 mL | ORAL_SOLUTION | Freq: Three times a day (TID) | ORAL | Status: DC | PRN

## 2012-03-31 MED ORDER — AZITHROMYCIN 250 MG PO TABS: ORAL_TABLET | ORAL | Status: AC

## 2012-03-31 NOTE — Telephone Encounter (Signed)
Pt had left v/m cough no better robitussin DM not helping; pt seen 03/24/12. I spoke with pt said already has appt today at 3:45pm.

## 2012-03-31 NOTE — Assessment & Plan Note (Signed)
Length of symptoms greater than 1 week.. So will treat for bacterial bronchitis.  Treat with Z-pak, cough suppressant. Follow up if not improving as expected.

## 2012-03-31 NOTE — Patient Instructions (Addendum)
Start antibiotics, complete. Push fluids, rest.  Call if not improving in 48-72 hours. Expect cough to last longer.

## 2012-03-31 NOTE — Progress Notes (Signed)
  Subjective:    Patient ID: [name], female    DOB: 04/22/1933, 78 y.o.   MRN: [id number]  URI  This is a new problem. The current episode started in the past 7 days. The problem has been gradually worsening. There has been no fever. Associated symptoms include congestion, coughing and ear pain. Pertinent negatives include no chest pain, headaches, nausea, neck pain, rhinorrhea, sinus pain, sore throat, swollen glands, vomiting or wheezing. Associated symptoms comments: Left anterior chest pain with coughing.. Treatments tried: robitussin. The treatment provided mild relief.   Has not been checking blood sugar.   Review of Systems  HENT: Positive for ear pain and congestion. Negative for sore throat, rhinorrhea and neck pain.   Respiratory: Positive for cough. Negative for wheezing.   Cardiovascular: Negative for chest pain.  Gastrointestinal: Negative for nausea and vomiting.  Neurological: Negative for headaches.       Objective:   Physical Exam  Constitutional: Vital signs are normal. She appears well-developed and well-nourished. She is cooperative.  Non-toxic appearance. She does not appear ill. No distress.  HENT:  Head: Normocephalic.  Right Ear: Hearing, tympanic membrane, external ear and ear canal normal. Tympanic membrane is not erythematous, not retracted and not bulging.  Left Ear: Hearing, tympanic membrane, external ear and ear canal normal. Tympanic membrane is not erythematous, not retracted and not bulging.  Nose: No mucosal edema or rhinorrhea. Right sinus exhibits no maxillary sinus tenderness and no frontal sinus tenderness. Left sinus exhibits no maxillary sinus tenderness and no frontal sinus tenderness.  Mouth/Throat: Uvula is midline, oropharynx is clear and moist and mucous membranes are normal.  Eyes: Conjunctivae, EOM and lids are normal. Pupils are equal, round, and reactive to light. No foreign bodies found.  Neck: Trachea normal and normal range of  motion. Neck supple. Carotid bruit is not present. No mass and no thyromegaly present.  Cardiovascular: Normal rate, regular rhythm, S1 normal, S2 normal, normal heart sounds, intact distal pulses and normal pulses.  Exam reveals no gallop and no friction rub.   No murmur heard. Pulmonary/Chest: Effort normal. Not tachypneic. No respiratory distress. She has no decreased breath sounds. She has no wheezes. She has rhonchi. She has no rales.       constant hacking cough productive  Neurological: She is alert.  Skin: Skin is warm, dry and intact. No rash noted.  Psychiatric: Her speech is normal and behavior is normal. Judgment normal. Her mood appears not anxious. Cognition and memory are normal. She does not exhibit a depressed mood.          Assessment & Plan:

## 2012-04-03 ENCOUNTER — Other Ambulatory Visit: Payer: Self-pay | Admitting: Cardiology

## 2012-04-05 ENCOUNTER — Other Ambulatory Visit: Payer: Self-pay | Admitting: Family Medicine

## 2012-04-05 MED ORDER — GUAIFENESIN-CODEINE 100-10 MG/5ML PO SYRP: 5.0000 mL | ORAL_SOLUTION | Freq: Three times a day (TID) | ORAL | Status: AC | PRN

## 2012-04-05 NOTE — Telephone Encounter (Signed)
Caller: Han/Patient; PCP: Kerby Nora E.; CB#: [phone]; ; ; Call regarding Recent Office Visit for Bronchitis; She has completed her prescription for Zithromax.  She was also given Cheratussin for the cough.  She had used Robitussin DM  with no relief.   She states she does feel better but would like a refill on the cough medication that provides her with relief.  She does occasionally cough during triage but states she is so much better. Denies any fever or Shortness of breath. Cough-Adult Protocol reviewed with request for refill.  Advised I would forward request. Home treatment reviewed.  [facility] uses [facility].

## 2012-04-08 ENCOUNTER — Other Ambulatory Visit: Payer: Self-pay | Admitting: Family Medicine

## 2012-04-27 ENCOUNTER — Other Ambulatory Visit: Payer: Self-pay | Admitting: Family Medicine

## 2012-05-05 ENCOUNTER — Other Ambulatory Visit: Payer: Self-pay | Admitting: Cardiology

## 2012-05-08 NOTE — Telephone Encounter (Signed)
Refilled generic dyazide

## 2012-06-05 ENCOUNTER — Encounter: Payer: Self-pay | Admitting: Family Medicine

## 2012-06-06 ENCOUNTER — Encounter: Payer: Self-pay | Admitting: *Deleted

## 2012-06-06 ENCOUNTER — Encounter: Payer: Self-pay | Admitting: Family Medicine

## 2012-06-22 ENCOUNTER — Other Ambulatory Visit: Payer: Medicare Other

## 2012-06-22 ENCOUNTER — Other Ambulatory Visit: Payer: Self-pay | Admitting: Cardiology

## 2012-06-27 ENCOUNTER — Ambulatory Visit: Payer: Medicare Other | Admitting: Family Medicine

## 2012-07-26 ENCOUNTER — Other Ambulatory Visit: Payer: Self-pay | Admitting: Family Medicine

## 2012-07-26 NOTE — Telephone Encounter (Signed)
Received refill request electronically. Last office visit 03/31/12/acute. Is it okay to refill medication?

## 2012-08-01 ENCOUNTER — Other Ambulatory Visit (INDEPENDENT_AMBULATORY_CARE_PROVIDER_SITE_OTHER): Payer: Medicare Other

## 2012-08-01 DIAGNOSIS — N289 Disorder of kidney and ureter, unspecified: Secondary | ICD-10-CM

## 2012-08-01 DIAGNOSIS — E7800 Pure hypercholesterolemia, unspecified: Secondary | ICD-10-CM

## 2012-08-01 DIAGNOSIS — I10 Essential (primary) hypertension: Secondary | ICD-10-CM

## 2012-08-01 DIAGNOSIS — E119 Type 2 diabetes mellitus without complications: Secondary | ICD-10-CM

## 2012-08-01 DIAGNOSIS — D509 Iron deficiency anemia, unspecified: Secondary | ICD-10-CM

## 2012-08-01 LAB — COMPREHENSIVE METABOLIC PANEL
ALT: 19 U/L (ref 0–35)
AST: 18 U/L (ref 0–37)
Albumin: 3.6 g/dL (ref 3.5–5.2)
CO2: 24 mEq/L (ref 19–32)
Calcium: 9.8 mg/dL (ref 8.4–10.5)
Chloride: 100 mEq/L (ref 96–112)
Creatinine, Ser: 1.3 mg/dL — ABNORMAL HIGH (ref 0.4–1.2)
GFR: 42.19 mL/min — ABNORMAL LOW (ref >60.00)
Potassium: 4.2 mEq/L (ref 3.5–5.1)
Sodium: 134 mEq/L — ABNORMAL LOW (ref 135–145)
Total Protein: 7.1 g/dL (ref 6.0–8.3)

## 2012-08-01 LAB — LDL CHOLESTEROL, DIRECT: Direct LDL: 109.2 mg/dL

## 2012-08-01 LAB — CBC WITH DIFFERENTIAL/PLATELET
Basophils Absolute: 0 10*3/uL (ref 0.0–0.1)
Eosinophils Absolute: 0.4 10*3/uL (ref 0.0–0.7)
Hemoglobin: 11.1 g/dL — ABNORMAL LOW (ref 12.0–15.0)
Lymphocytes Relative: 20.2 % (ref 12.0–46.0)
Lymphs Abs: 1.2 10*3/uL (ref 0.7–4.0)
MCHC: 33 g/dL (ref 30.0–36.0)
Neutro Abs: 3.9 10*3/uL (ref 1.4–7.7)
Platelets: 154 10*3/uL (ref 150.0–400.0)
RDW: 15.3 % — ABNORMAL HIGH (ref 11.5–14.6)

## 2012-08-01 LAB — LIPID PANEL
Cholesterol: 228 mg/dL — ABNORMAL HIGH (ref 0–200)
HDL: 36.7 mg/dL — ABNORMAL LOW (ref >39.00)

## 2012-08-01 LAB — HEMOGLOBIN A1C: Hgb A1c MFr Bld: 11.3 % — ABNORMAL HIGH (ref 4.6–6.5)

## 2012-08-02 LAB — VITAMIN D 25 HYDROXY (VIT D DEFICIENCY, FRACTURES): Vit D, 25-Hydroxy: 26 ng/mL — ABNORMAL LOW (ref 30–89)

## 2012-08-03 ENCOUNTER — Encounter: Payer: Self-pay | Admitting: Family Medicine

## 2012-08-03 ENCOUNTER — Ambulatory Visit (INDEPENDENT_AMBULATORY_CARE_PROVIDER_SITE_OTHER): Payer: Medicare Other | Admitting: Family Medicine

## 2012-08-03 VITALS — BP 140/82 | HR 88 | Temp 98.4°F | Ht 63.0 in | Wt 180.4 lb

## 2012-08-03 DIAGNOSIS — D509 Iron deficiency anemia, unspecified: Secondary | ICD-10-CM

## 2012-08-03 DIAGNOSIS — Z23 Encounter for immunization: Secondary | ICD-10-CM

## 2012-08-03 DIAGNOSIS — E559 Vitamin D deficiency, unspecified: Secondary | ICD-10-CM | POA: Insufficient documentation

## 2012-08-03 DIAGNOSIS — N289 Disorder of kidney and ureter, unspecified: Secondary | ICD-10-CM

## 2012-08-03 DIAGNOSIS — I10 Essential (primary) hypertension: Secondary | ICD-10-CM

## 2012-08-03 DIAGNOSIS — E119 Type 2 diabetes mellitus without complications: Secondary | ICD-10-CM

## 2012-08-03 DIAGNOSIS — E7800 Pure hypercholesterolemia, unspecified: Secondary | ICD-10-CM

## 2012-08-03 MED ORDER — GLIPIZIDE ER 10 MG PO TB24: 10.0000 mg | ORAL_TABLET | Freq: Every day | ORAL | Status: DC

## 2012-08-03 NOTE — Patient Instructions (Addendum)
Work on stopping sweets and chocolate.  Work on regular exercise .[place] or curves. Increase atorvastatin to 1 tab daily. Fish oil or flax seed oil (omega three fatty acids) 2000 mg twice daily. Change gliburide to glipizide XL to take once daily. Start vit D 400 IU twice daily. Follow up in 3 months.

## 2012-08-03 NOTE — Progress Notes (Signed)
Subjective:    Patient ID: [name], female    DOB: 1933/04/30, 78 y.o.   MRN: [id number]  HPI Diabetes: poor control, woirsened further from last OV. On glyburide.  Lab Results  Component Value Date   HGBA1C 11.3* 08/01/2012   Using medications without difficulties:Yes  Hypoglycemic episodes:?  Hyperglycemic episodes:?  Feet problems:At bedtime, burning in feet. If not improving at next OV consider gabapentin.  Blood Sugars averaging:Not checking.  She reports her diet is very poor eating A lot of chocolate.  Especally over Halloween.  eye exam within last year :  Anemia: Went to hospital for low hemoglobin in 04/2011. No clear source. Transfusion again for Hg 7.8 in 5.2013. On ferrous sulfate 3 tabs daily now for 1 month. Hemeoccult cards negative.  She has not had any bleeding that she knows of. No bleeding with urination and no bleeding per her bowels.  Her last colonoscopy in August of 2009 had only a diminutive polyp. She did have some gastritis on upper endoscopy. Of note, the patient has been taking some Aleve recently.  Tolerating the 3 tabs a day of iron. Improving on iron... nml erythropoetin.  Hypertension: Inadequate control on metoprolol, dyazide She reports she has been very anxious about going to the doctor.  Using medication without problems or lightheadedness: None  Chest pain with exertion:None  Edema:None  Short of breath:None  Average home BPs:Not checking  Other issues:   Elevated Cholesterol: Poor control on atorvastatin daily at last OV , due to cost of crestor. She is taking it every other day. Lab Results  Component Value Date   CHOL 228* 08/01/2012   HDL 36.70* 08/01/2012   LDLDIRECT 109.2 08/01/2012   TRIG 481.0 Triglyceride is over 400; calculations on Lipids are invalid.* 08/01/2012   CHOLHDL 6 08/01/2012  Using medications without problems:None  Muscle aches: None  Diet compliance:Moderate Exercise: Has not been walking since hip surgery.    Other complaints:    CKD, stable      Review of Systems  Constitutional: Negative for fever and fatigue.  HENT: Negative for ear pain.   Eyes: Negative for pain.  Respiratory: Negative for chest tightness and shortness of breath.   Cardiovascular: Negative for chest pain, palpitations and leg swelling.  Gastrointestinal: Negative for abdominal pain.  Genitourinary: Negative for dysuria.       Objective:   Physical Exam  Constitutional: Vital signs are normal. She appears well-developed and well-nourished. She is cooperative.  Non-toxic appearance. She does not appear ill. No distress.  HENT:  Head: Normocephalic.  Right Ear: Hearing, tympanic membrane, external ear and ear canal normal. Tympanic membrane is not erythematous, not retracted and not bulging.  Left Ear: Hearing, tympanic membrane, external ear and ear canal normal. Tympanic membrane is not erythematous, not retracted and not bulging.  Nose: No mucosal edema or rhinorrhea. Right sinus exhibits no maxillary sinus tenderness and no frontal sinus tenderness. Left sinus exhibits no maxillary sinus tenderness and no frontal sinus tenderness.  Mouth/Throat: Uvula is midline, oropharynx is clear and moist and mucous membranes are normal.  Eyes: Conjunctivae normal, EOM and lids are normal. Pupils are equal, round, and reactive to light. No foreign bodies found.  Neck: Trachea normal and normal range of motion. Neck supple. Carotid bruit is not present. No mass and no thyromegaly present.  Cardiovascular: Normal rate, regular rhythm, S1 normal, S2 normal, normal heart sounds, intact distal pulses and normal pulses.  Exam reveals no gallop and no friction  rub.   No murmur heard. Pulmonary/Chest: Effort normal and breath sounds normal. Not tachypneic. No respiratory distress. She has no decreased breath sounds. She has no wheezes. She has no rhonchi. She has no rales.  Abdominal: Soft. Normal appearance and bowel sounds are  normal. There is no tenderness.  Neurological: She is alert.  Skin: Skin is warm, dry and intact. No rash noted.  Psychiatric: Her speech is normal and behavior is normal. Judgment and thought content normal. Her mood appears not anxious. Cognition and memory are normal. She does not exhibit a depressed mood.     Diabetic foot exam: Normal inspection No skin breakdown No calluses  Normal DP pulses Normal sensation to light touch and monofilament Nails normal      Assessment & Plan:

## 2012-08-03 NOTE — Assessment & Plan Note (Signed)
Improving with iron. Nml erythropoietin despite CKD.

## 2012-08-03 NOTE — Assessment & Plan Note (Signed)
Poor control on atorvastain every other day, but somewhat better. Increase to daily. Add fish oil. Recheck in 3 months.

## 2012-08-03 NOTE — Assessment & Plan Note (Signed)
Mildly low. Start OTC vit D.

## 2012-08-03 NOTE — Addendum Note (Signed)
Addended by: Consuello Masse on: 08/03/2012 10:08 AM   Modules accepted: Orders

## 2012-08-03 NOTE — Assessment & Plan Note (Signed)
Stable

## 2012-08-03 NOTE — Assessment & Plan Note (Signed)
Follow at home. Has white coat HTN.  Continue current meds.

## 2012-08-03 NOTE — Assessment & Plan Note (Signed)
Inadequate control. Work on lifestyle change. Increase to max glipizide and change to longacting.  Likely will need JAnuvia in addition. Recheck in 3 months.

## 2012-10-29 ENCOUNTER — Other Ambulatory Visit: Payer: Self-pay | Admitting: Family Medicine

## 2012-11-07 ENCOUNTER — Ambulatory Visit: Payer: Medicare Other | Admitting: Family Medicine

## 2012-12-12 ENCOUNTER — Ambulatory Visit: Payer: Medicare Other | Admitting: Family Medicine

## 2012-12-14 ENCOUNTER — Other Ambulatory Visit: Payer: Self-pay | Admitting: *Deleted

## 2012-12-14 MED ORDER — METOPROLOL TARTRATE 25 MG PO TABS: 25.0000 mg | ORAL_TABLET | Freq: Two times a day (BID) | ORAL | Status: DC

## 2012-12-19 ENCOUNTER — Encounter: Payer: Self-pay | Admitting: Family Medicine

## 2012-12-19 ENCOUNTER — Telehealth: Payer: Self-pay | Admitting: Family Medicine

## 2012-12-19 ENCOUNTER — Ambulatory Visit (INDEPENDENT_AMBULATORY_CARE_PROVIDER_SITE_OTHER): Payer: Medicare Other | Admitting: Family Medicine

## 2012-12-19 VITALS — BP 120/72 | HR 60 | Temp 97.8°F | Ht 63.0 in | Wt 175.8 lb

## 2012-12-19 DIAGNOSIS — I10 Essential (primary) hypertension: Secondary | ICD-10-CM

## 2012-12-19 DIAGNOSIS — E039 Hypothyroidism, unspecified: Secondary | ICD-10-CM

## 2012-12-19 DIAGNOSIS — G629 Polyneuropathy, unspecified: Secondary | ICD-10-CM

## 2012-12-19 DIAGNOSIS — D509 Iron deficiency anemia, unspecified: Secondary | ICD-10-CM

## 2012-12-19 DIAGNOSIS — E119 Type 2 diabetes mellitus without complications: Secondary | ICD-10-CM

## 2012-12-19 DIAGNOSIS — G609 Hereditary and idiopathic neuropathy, unspecified: Secondary | ICD-10-CM

## 2012-12-19 DIAGNOSIS — E7800 Pure hypercholesterolemia, unspecified: Secondary | ICD-10-CM

## 2012-12-19 DIAGNOSIS — I2510 Atherosclerotic heart disease of native coronary artery without angina pectoris: Secondary | ICD-10-CM

## 2012-12-19 LAB — CBC WITH DIFFERENTIAL/PLATELET
Basophils Absolute: 0 10*3/uL (ref 0.0–0.1)
Basophils Relative: 0.5 % (ref 0.0–3.0)
Eosinophils Absolute: 0.5 10*3/uL (ref 0.0–0.7)
Hemoglobin: 12.3 g/dL (ref 12.0–15.0)
MCHC: 34.2 g/dL (ref 30.0–36.0)
MCV: 89.2 fl (ref 78.0–100.0)
Monocytes Absolute: 0.4 10*3/uL (ref 0.1–1.0)
Neutro Abs: 5.8 10*3/uL (ref 1.4–7.7)
Neutrophils Relative %: 73.2 % (ref 43.0–77.0)
RBC: 4.03 Mil/uL (ref 3.87–5.11)
RDW: 14 % (ref 11.5–14.6)

## 2012-12-19 LAB — LIPID PANEL
Cholesterol: 197 mg/dL (ref 0–200)
Triglycerides: 409 mg/dL — ABNORMAL HIGH (ref 0.0–149.0)
VLDL: 81.8 mg/dL — ABNORMAL HIGH (ref 0.0–40.0)

## 2012-12-19 LAB — COMPREHENSIVE METABOLIC PANEL
BUN: 42 mg/dL — ABNORMAL HIGH (ref 6–23)
CO2: 23 mEq/L (ref 19–32)
Creatinine, Ser: 1.4 mg/dL — ABNORMAL HIGH (ref 0.4–1.2)
GFR: 38.69 mL/min — ABNORMAL LOW (ref >60.00)
Glucose, Bld: 315 mg/dL — ABNORMAL HIGH (ref 70–99)
Sodium: 134 mEq/L — ABNORMAL LOW (ref 135–145)
Total Bilirubin: 0.6 mg/dL (ref 0.3–1.2)
Total Protein: 7.3 g/dL (ref 6.0–8.3)

## 2012-12-19 NOTE — Assessment & Plan Note (Signed)
Well controlled. Continue current medication.  

## 2012-12-19 NOTE — Assessment & Plan Note (Signed)
Stable last check 

## 2012-12-19 NOTE — Assessment & Plan Note (Signed)
Controlled with tramadol. Pt not interested in gabapentin.

## 2012-12-19 NOTE — Assessment & Plan Note (Signed)
Due for check on atorvastatin which is more affordable for her.

## 2012-12-19 NOTE — Telephone Encounter (Signed)
Notify pt that she has very poor control of diabetes... Worse than last check... We need to have her come back in to discuss possibly starting insulin as well as referral to nutritionist etc.

## 2012-12-19 NOTE — Assessment & Plan Note (Signed)
Likely still poor control. Metformin contraindicated in renal insufficiency. If A1C >7 likely add onglyza or [place]. If very high consider insulin.

## 2012-12-19 NOTE — Progress Notes (Signed)
Diabetes: Poor control previously Due for re-eval. On glucotrol XL. Lab Results  Component Value Date   HGBA1C 11.3* 08/01/2012  Using medications without difficulties:Yes  Hypoglycemic episodes: Non Hyperglycemic episodes:200s Feet problems:At bedtime, tingling, prickly feeling in feet. Uses tramadol which helps with pain. Blood Sugars averaging: FBS 120-200 Has decreased caramel frappe.. Still eats a lot of chocolate.  No exercise eye exam within last year: yes, recent cataract surgery.  Anemia: Went to hospital for low hemoglobin in 04/2011. No clear source. Transfusion again for Hg 7.8 in 5.2013. On ferrous sulfate 3 tabs daily.  At last check Hg improving. Hemeoccult cards negative.  She has not had any bleeding that she knows of. No bleeding with urination and no bleeding per her bowels.  Her last colonoscopy in August of 2009 had only a diminutive polyp. She did have some gastritis on upper endoscopy.  Tolerating the 3 tabs a day of iron.  Hypertension: Improved control on metoprolol, dyazide  Using medication without problems or lightheadedness: None  Chest pain with exertion:None  Edema:None  Short of breath:With activity. Average home BPs: Not checking  Other issues:   Elevated Cholesterol: On lipitor 40 mg daily. Due for re-eval. Lab Results  Component Value Date   CHOL 228* 08/01/2012   HDL 36.70* 08/01/2012   LDLDIRECT 109.2 08/01/2012   TRIG 481.0 Triglyceride is over 400; calculations on Lipids are invalid.* 08/01/2012   CHOLHDL 6 08/01/2012    Using medications without problems:None  Muscle aches: None  Diet compliance:Poor  Exercise:None  Other complaints:   Hypothyroid Stable on levo at last check. Lab Results   Component  Value  Date    TSH  1.74  03/21/2012    Review of Systems  Constitutional: Negative for fever and fatigue.  HENT: Negative for ear pain.  Eyes: Negative for pain.  Respiratory: Negative for chest tightness and shortness of breath.   Cardiovascular: Negative for chest pain, palpitations and leg swelling.  Gastrointestinal: Negative for abdominal pain.  Genitourinary: Negative for dysuria.  Objective:   Physical Exam  Constitutional: Vital signs are normal. She appears well-developed and well-nourished. She is cooperative. Non-toxic appearance. She does not appear ill. No distress.  HENT:  Head: Normocephalic.  Right Ear: Hearing, tympanic membrane, external ear and ear canal normal. Tympanic membrane is not erythematous, not retracted and not bulging.  Left Ear: Hearing, tympanic membrane, external ear and ear canal normal. Tympanic membrane is not erythematous, not retracted and not bulging.  Nose: No mucosal edema or rhinorrhea. Right sinus exhibits no maxillary sinus tenderness and no frontal sinus tenderness. Left sinus exhibits no maxillary sinus tenderness and no frontal sinus tenderness.  Mouth/Throat: Uvula is midline, oropharynx is clear and moist and mucous membranes are normal.  Eyes: Conjunctivae, EOM and lids are normal. Pupils are equal, round, and reactive to light. No foreign bodies found.  Neck: Trachea normal and normal range of motion. Neck supple. Carotid bruit is not present. No mass and no thyromegaly present.  Cardiovascular: Normal rate, regular rhythm, S1 normal, S2 normal, normal heart sounds, intact distal pulses and normal pulses. Exam reveals no gallop and no friction rub.  No murmur heard.  Pulmonary/Chest: Effort normal and breath sounds normal. Not tachypneic. No respiratory distress. She has no decreased breath sounds. She has no wheezes. She has no rhonchi. She has no rales.  Abdominal: Soft. Normal appearance and bowel sounds are normal. There is no tenderness.  Neurological: She is alert.  Skin: Skin is warm, dry  and intact. No rash noted.  Psychiatric: Her speech is normal and behavior is normal. Judgment and thought content normal. Her mood appears not anxious. Cognition and memory are  normal. She does not exhibit a depressed mood.   Diabetic foot exam:  Normal inspection  No skin breakdown  No calluses  Normal DP pulses  Diminished sensation to light touch and monofilament  Nails normal

## 2012-12-19 NOTE — Patient Instructions (Addendum)
We will call you with the lab results. Follow blood sugars fasting (goal <120) every day, and occasionally 2 hours after a meal ( goal <180). Cut back on candy. Increase exercise.

## 2012-12-19 NOTE — Assessment & Plan Note (Signed)
ON three iron a day. Due for re-eval.

## 2012-12-20 NOTE — Telephone Encounter (Signed)
Left message for patient to return my call.

## 2012-12-21 NOTE — Telephone Encounter (Signed)
In that case can I refer her to a nutritionist at least? Make sure she has follow up with labs prior in 3 months scheduled.

## 2012-12-21 NOTE — Telephone Encounter (Signed)
Patient wants to work on her life style a little bit longer.

## 2012-12-22 NOTE — Telephone Encounter (Signed)
Patient doesn't want referral at this time and will call us in 3 weeks to let us know how her life style changes are going

## 2012-12-22 NOTE — Telephone Encounter (Signed)
Noted  

## 2013-03-05 ENCOUNTER — Other Ambulatory Visit: Payer: Self-pay | Admitting: *Deleted

## 2013-03-05 MED ORDER — LOSARTAN POTASSIUM 50 MG PO TABS: 50.0000 mg | ORAL_TABLET | Freq: Every day | ORAL | Status: DC

## 2013-03-13 ENCOUNTER — Other Ambulatory Visit: Payer: Self-pay | Admitting: Family Medicine

## 2013-03-27 ENCOUNTER — Other Ambulatory Visit: Payer: Medicare Other

## 2013-03-27 ENCOUNTER — Encounter: Payer: Medicare Other | Admitting: Family Medicine

## 2013-04-03 ENCOUNTER — Encounter: Payer: Medicare Other | Admitting: Family Medicine

## 2013-04-05 ENCOUNTER — Other Ambulatory Visit: Payer: Self-pay | Admitting: Cardiology

## 2013-04-26 ENCOUNTER — Encounter: Payer: Self-pay | Admitting: Internal Medicine

## 2013-05-03 ENCOUNTER — Other Ambulatory Visit: Payer: Self-pay | Admitting: Cardiology

## 2013-05-03 NOTE — Telephone Encounter (Signed)
Fax Received. Refill Completed. [name] (R.M.A)   

## 2013-06-06 ENCOUNTER — Encounter: Payer: Self-pay | Admitting: Family Medicine

## 2013-06-07 ENCOUNTER — Encounter: Payer: Self-pay | Admitting: Family Medicine

## 2013-06-07 ENCOUNTER — Encounter: Payer: Self-pay | Admitting: *Deleted

## 2013-06-30 ENCOUNTER — Other Ambulatory Visit: Payer: Self-pay | Admitting: Cardiology

## 2013-06-30 ENCOUNTER — Other Ambulatory Visit: Payer: Self-pay | Admitting: Family Medicine

## 2013-07-01 NOTE — Telephone Encounter (Signed)
Last office visit 12/19/2012.  Medication marked out on medication list.  Ok to refill?

## 2013-07-02 NOTE — Telephone Encounter (Signed)
Must have been an error. Refilled.

## 2013-07-04 ENCOUNTER — Other Ambulatory Visit: Payer: Self-pay | Admitting: Cardiology

## 2013-07-29 ENCOUNTER — Other Ambulatory Visit: Payer: Self-pay | Admitting: Family Medicine

## 2013-07-29 NOTE — Telephone Encounter (Signed)
Last office visit 12/19/2012.  Ok to refill?

## 2013-07-30 ENCOUNTER — Other Ambulatory Visit: Payer: Self-pay | Admitting: Cardiology

## 2013-07-30 NOTE — Telephone Encounter (Signed)
Called to [facility] [address] 

## 2013-07-31 ENCOUNTER — Other Ambulatory Visit: Payer: Self-pay | Admitting: Family Medicine

## 2013-08-28 ENCOUNTER — Telehealth: Payer: Self-pay | Admitting: Family Medicine

## 2013-08-28 NOTE — Telephone Encounter (Signed)
Message copied by Kerby Nora E on Tue Aug 28, 2013  8:22 AM ------      Message from: Josph Macho A      Created: Mon Aug 27, 2013  5:41 PM       Per [facility]-patient needs microalbumin since not on ACEI. Please order and I will schedule lab appt. Thanks! ------

## 2013-08-28 NOTE — Telephone Encounter (Signed)
She is on an ARB (losartan) which I believe counts as it is also kidney protective.

## 2013-08-28 NOTE — Telephone Encounter (Signed)
Ok. I will notate on [facility] form.

## 2013-08-30 ENCOUNTER — Other Ambulatory Visit: Payer: Self-pay | Admitting: Family Medicine

## 2013-08-31 ENCOUNTER — Encounter: Payer: Medicare Other | Admitting: Family Medicine

## 2013-09-01 ENCOUNTER — Other Ambulatory Visit: Payer: Self-pay | Admitting: Cardiology

## 2013-09-15 ENCOUNTER — Other Ambulatory Visit: Payer: Self-pay | Admitting: Cardiology

## 2013-09-15 ENCOUNTER — Other Ambulatory Visit: Payer: Self-pay | Admitting: Family Medicine

## 2013-09-18 ENCOUNTER — Other Ambulatory Visit: Payer: Self-pay | Admitting: Family Medicine

## 2013-09-28 ENCOUNTER — Other Ambulatory Visit: Payer: Self-pay | Admitting: Family Medicine

## 2013-10-12 ENCOUNTER — Other Ambulatory Visit: Payer: Self-pay | Admitting: Family Medicine

## 2013-10-17 ENCOUNTER — Other Ambulatory Visit: Payer: Self-pay | Admitting: Family Medicine

## 2013-10-17 NOTE — Telephone Encounter (Signed)
Pt last office visit 12/19/12.  Ok to refill?

## 2013-10-18 ENCOUNTER — Other Ambulatory Visit: Payer: Self-pay | Admitting: *Deleted

## 2013-10-18 MED ORDER — LEVOTHYROXINE SODIUM 50 MCG PO TABS: ORAL_TABLET | ORAL | Status: DC

## 2013-10-18 NOTE — Telephone Encounter (Signed)
Called to [facility] [address] 

## 2013-11-02 ENCOUNTER — Other Ambulatory Visit: Payer: Self-pay | Admitting: Cardiology

## 2013-11-12 ENCOUNTER — Other Ambulatory Visit: Payer: Self-pay | Admitting: Cardiology

## 2013-12-04 ENCOUNTER — Encounter: Payer: Self-pay | Admitting: Family Medicine

## 2013-12-04 ENCOUNTER — Ambulatory Visit (INDEPENDENT_AMBULATORY_CARE_PROVIDER_SITE_OTHER): Payer: Medicare Other | Admitting: Family Medicine

## 2013-12-04 ENCOUNTER — Telehealth: Payer: Self-pay | Admitting: Family Medicine

## 2013-12-04 VITALS — BP 118/66 | HR 58 | Temp 97.2°F | Ht 62.5 in | Wt 175.5 lb

## 2013-12-04 DIAGNOSIS — E119 Type 2 diabetes mellitus without complications: Secondary | ICD-10-CM

## 2013-12-04 DIAGNOSIS — E039 Hypothyroidism, unspecified: Secondary | ICD-10-CM

## 2013-12-04 DIAGNOSIS — N289 Disorder of kidney and ureter, unspecified: Secondary | ICD-10-CM

## 2013-12-04 DIAGNOSIS — G909 Disorder of the autonomic nervous system, unspecified: Secondary | ICD-10-CM

## 2013-12-04 DIAGNOSIS — Z0000 Encounter for general adult medical examination without abnormal findings: Secondary | ICD-10-CM

## 2013-12-04 DIAGNOSIS — E7800 Pure hypercholesterolemia, unspecified: Secondary | ICD-10-CM

## 2013-12-04 DIAGNOSIS — I10 Essential (primary) hypertension: Secondary | ICD-10-CM

## 2013-12-04 DIAGNOSIS — E1143 Type 2 diabetes mellitus with diabetic autonomic (poly)neuropathy: Secondary | ICD-10-CM

## 2013-12-04 DIAGNOSIS — D509 Iron deficiency anemia, unspecified: Secondary | ICD-10-CM

## 2013-12-04 DIAGNOSIS — E559 Vitamin D deficiency, unspecified: Secondary | ICD-10-CM

## 2013-12-04 DIAGNOSIS — E1149 Type 2 diabetes mellitus with other diabetic neurological complication: Secondary | ICD-10-CM

## 2013-12-04 MED ORDER — PIOGLITAZONE HCL 30 MG PO TABS: 30.0000 mg | ORAL_TABLET | Freq: Every day | ORAL | Status: DC

## 2013-12-04 MED ORDER — GABAPENTIN 100 MG PO CAPS: 100.0000 mg | ORAL_CAPSULE | Freq: Three times a day (TID) | ORAL | Status: DC

## 2013-12-04 NOTE — Patient Instructions (Addendum)
Return fasting labs in next few weeks. Start gabapentin 1 tab at bedtime for pain in feet. If not improving can increase up by 1 tab a week to 300 mg Start actos 1 tab daily. Follow blood sugars at home and record. Follow up in 3 months for DM with fasting labs prior.

## 2013-12-04 NOTE — Assessment & Plan Note (Signed)
Well controlled. Continue current medication.  

## 2013-12-04 NOTE — Assessment & Plan Note (Signed)
Due for a1C, likely very poor control. Pt refuses insulin/ injections at this time. Metfromin contraindicated. Pt request generic.. Will try actos 30mg  .. Likely need to increase to 45 mg daily.  Follow up in 3 months with labs prior.

## 2013-12-04 NOTE — Assessment & Plan Note (Signed)
Due for re-eval. Encouraged exercise, weight loss, healthy eating habits.

## 2013-12-04 NOTE — Telephone Encounter (Signed)
Relevant patient education mailed to patient.  

## 2013-12-04 NOTE — Progress Notes (Signed)
   Subjective:    Patient ID: [name], female    DOB: 1932/12/08, 80 y.o.   MRN: [id number]  HPI  I have personally reviewed the Medicare Annual Wellness questionnaire and have noted 1. The patient's medical and social history 2. Their use of alcohol, tobacco or illicit drugs 3. Their current medications and supplements 4. The patient's functional ability including ADL's, fall risks, home safety risks and hearing or visual             impairment. 5. Diet and physical activities 6. Evidence for depression or mood disorders The patients weight, height, BMI and visual acuity have been recorded in the chart I have made referrals, counseling and provided education to the patient based review of the above and I have provided the pt with a written personalized care plan for preventive services.  Diabetes: Poor control previously Due for re-eval. On glucotrol XL.  Lab Results  Component Value Date   HGBA1C 11.7* 12/19/2012  Using medications without difficulties:Yes  Hypoglycemic episodes: Non  Hyperglycemic episodes:200s  Feet problems:At bedtime, tingling, prickly feeling in feet.Tramadol does not help with pain... Pain limits exercsie. Blood Sugars averaging: FBS 275 Improving diet No exercise  eye exam within last year: yes, recent cataract surgery.   Anemia: Due for re-eval. She did have some gastritis on upper endoscopy.  Tolerating the 3 tabs a day of iron.  Hypertension: Improved control on metoprolol, dyazide  Using medication without problems or lightheadedness: None  Chest pain with exertion:None  Edema:None  Short of breath:With activity.  Average home BPs: Not checking  Other issues:   Elevated Cholesterol: On lipitor 40 mg daily. Due for re-eval.  Using medications without problems:None  Muscle aches: None  Diet compliance:Poor  Exercise:None  Other complaints:   Hypothyroid Stable on levo at last check.  Due for re-check.  Review of Systems    Constitutional: Negative for fever and fatigue.  HENT: Negative for ear pain.  Eyes: Negative for pain.  Respiratory: Negative for chest tightness and shortness of breath.  Cardiovascular: Negative for chest pain, palpitations and leg swelling.  Gastrointestinal: Negative for abdominal pain.  Genitourinary: Negative for dysuria.     Review of Systems     Objective:   Physical Exam        Assessment & Plan:  The patient's preventative maintenance and recommended screening tests for an annual wellness exam were reviewed in full today. Brought up to date unless services declined.  Counselled on the importance of diet, exercise, and its role in overall health and mortality. The patient's FH and SH was reviewed, including their home life, tobacco status, and drug and alcohol status.   Vaccines:not interested in shingles  DEXA: normal 2009 PAP/DVE: not indicated.  Mammo: 05/2013 stable, hx of breast cancer on left, s/p mastectomy Colon:Her last colonoscopy in August of 2009 had only a diminutive polyp, no repeat needed.

## 2013-12-04 NOTE — Progress Notes (Signed)
Pre visit review using our clinic review tool, if applicable. No additional management support is needed unless otherwise documented below in the visit note. 

## 2013-12-04 NOTE — Assessment & Plan Note (Signed)
Due for re-eval. 

## 2013-12-04 NOTE — Assessment & Plan Note (Signed)
Stop tramadol. Trial of neurontin.

## 2013-12-17 ENCOUNTER — Encounter: Payer: Self-pay | Admitting: Internal Medicine

## 2013-12-20 ENCOUNTER — Other Ambulatory Visit: Payer: Medicare Other

## 2013-12-31 ENCOUNTER — Other Ambulatory Visit: Payer: Medicare Other

## 2014-01-08 ENCOUNTER — Other Ambulatory Visit: Payer: Self-pay | Admitting: Family Medicine

## 2014-01-08 MED ORDER — GLUCOSE BLOOD VI STRP: ORAL_STRIP | Status: DC

## 2014-01-09 ENCOUNTER — Other Ambulatory Visit (HOSPITAL_COMMUNITY): Payer: Self-pay | Admitting: *Deleted

## 2014-01-09 ENCOUNTER — Other Ambulatory Visit (INDEPENDENT_AMBULATORY_CARE_PROVIDER_SITE_OTHER): Payer: Medicare Other

## 2014-01-09 ENCOUNTER — Telehealth: Payer: Self-pay

## 2014-01-09 ENCOUNTER — Ambulatory Visit (HOSPITAL_COMMUNITY)
Admission: RE | Admit: 2014-01-09 | Discharge: 2014-01-09 | Disposition: A | Discharge status: Home / Self Care | Payer: Medicare Other | Source: Ambulatory Visit | Attending: Family Medicine | Admitting: Family Medicine

## 2014-01-09 DIAGNOSIS — I10 Essential (primary) hypertension: Secondary | ICD-10-CM

## 2014-01-09 DIAGNOSIS — E039 Hypothyroidism, unspecified: Secondary | ICD-10-CM

## 2014-01-09 DIAGNOSIS — E559 Vitamin D deficiency, unspecified: Secondary | ICD-10-CM

## 2014-01-09 DIAGNOSIS — E7800 Pure hypercholesterolemia, unspecified: Secondary | ICD-10-CM

## 2014-01-09 DIAGNOSIS — D649 Anemia, unspecified: Secondary | ICD-10-CM | POA: Diagnosis present

## 2014-01-09 DIAGNOSIS — E119 Type 2 diabetes mellitus without complications: Secondary | ICD-10-CM

## 2014-01-09 LAB — CBC WITH DIFFERENTIAL/PLATELET
Basophils Absolute: 0 10*3/uL (ref 0.0–0.1)
Basophils Relative: 0.6 % (ref 0.0–3.0)
Eosinophils Absolute: 0.7 10*3/uL (ref 0.0–0.7)
Eosinophils Relative: 9 % — ABNORMAL HIGH (ref 0.0–5.0)
Hemoglobin: 6.8 g/dL — CL (ref 12.0–15.0)
LYMPHS ABS: 1.4 10*3/uL (ref 0.7–4.0)
Lymphocytes Relative: 17.9 % (ref 12.0–46.0)
MCHC: 33.9 g/dL (ref 30.0–36.0)
MCV: 95.4 fl (ref 78.0–100.0)
MONO ABS: 0.5 10*3/uL (ref 0.1–1.0)
MONOS PCT: 6.2 % (ref 3.0–12.0)
Neutro Abs: 5.1 10*3/uL (ref 1.4–7.7)
Neutrophils Relative %: 66.3 % (ref 43.0–77.0)
PLATELETS: 211 10*3/uL (ref 150.0–400.0)
RBC: 2.12 Mil/uL — ABNORMAL LOW (ref 3.87–5.11)
RDW: 18.5 % — ABNORMAL HIGH (ref 11.5–14.6)
WBC: 7.7 10*3/uL (ref 4.5–10.5)

## 2014-01-09 LAB — LIPID PANEL
CHOL/HDL RATIO: 5
Cholesterol: 188 mg/dL (ref 0–200)
HDL: 39.6 mg/dL (ref >39.00)
LDL CALC: 94 mg/dL (ref 0–99)
Triglycerides: 270 mg/dL — ABNORMAL HIGH (ref 0.0–149.0)
VLDL: 54 mg/dL — ABNORMAL HIGH (ref 0.0–40.0)

## 2014-01-09 LAB — COMPREHENSIVE METABOLIC PANEL
ALT: 16 U/L (ref 0–35)
AST: 15 U/L (ref 0–37)
Albumin: 3 g/dL — ABNORMAL LOW (ref 3.5–5.2)
Alkaline Phosphatase: 67 U/L (ref 39–117)
BUN: 54 mg/dL — ABNORMAL HIGH (ref 6–23)
CALCIUM: 9 mg/dL (ref 8.4–10.5)
CO2: 19 meq/L (ref 19–32)
CREATININE: 1.7 mg/dL — AB (ref 0.4–1.2)
Chloride: 107 mEq/L (ref 96–112)
GFR: 31.05 mL/min — AB (ref >60.00)
Glucose, Bld: 220 mg/dL — ABNORMAL HIGH (ref 70–99)
Potassium: 3.4 mEq/L — ABNORMAL LOW (ref 3.5–5.1)
SODIUM: 135 meq/L (ref 135–145)
TOTAL PROTEIN: 5.9 g/dL — AB (ref 6.0–8.3)
Total Bilirubin: 0.6 mg/dL (ref 0.3–1.2)

## 2014-01-09 LAB — TSH: TSH: 2.57 u[IU]/mL (ref 0.35–5.50)

## 2014-01-09 LAB — HEMOGLOBIN A1C: Hgb A1c MFr Bld: 7.6 % — ABNORMAL HIGH (ref 4.6–6.5)

## 2014-01-09 LAB — PREPARE RBC (CROSSMATCH)

## 2014-01-09 NOTE — Telephone Encounter (Signed)
i called all listed telephone numbers to assess got no response. LMOM to call our office ASAP. Will continue to try to reach patient until we are able to do so.  Electronically Signed  By: Owens Loffler, MD On: 01/09/2014 1:51 PM

## 2014-01-09 NOTE — Telephone Encounter (Signed)
Noted. Pt with recent gastritis.. ? If anemai due to this.  Pt was feeling well at recetn OV Hg was routine check.

## 2014-01-09 NOTE — Telephone Encounter (Signed)
[name] with [place] called critical lab; hgb 6.8 and hct 20.2. [name] will fax lab report to [facility] [phone].

## 2014-01-09 NOTE — Telephone Encounter (Signed)
We have arranged for transfusion.

## 2014-01-10 ENCOUNTER — Encounter (HOSPITAL_COMMUNITY)
Admission: RE | Admit: 2014-01-10 | Discharge: 2014-01-10 | Disposition: A | Discharge status: Home / Self Care | Payer: Medicare Other | Source: Ambulatory Visit | Attending: Family Medicine | Admitting: Family Medicine

## 2014-01-10 DIAGNOSIS — Z5181 Encounter for therapeutic drug level monitoring: Secondary | ICD-10-CM | POA: Insufficient documentation

## 2014-01-10 DIAGNOSIS — D649 Anemia, unspecified: Secondary | ICD-10-CM | POA: Insufficient documentation

## 2014-01-10 LAB — VITAMIN D 25 HYDROXY (VIT D DEFICIENCY, FRACTURES): VIT D 25 HYDROXY: 38 ng/mL (ref 30–89)

## 2014-01-11 LAB — TYPE AND SCREEN
ABO/RH(D): O POS
Antibody Screen: NEGATIVE
Unit division: 0
Unit division: 0

## 2014-01-14 ENCOUNTER — Other Ambulatory Visit: Payer: Self-pay | Admitting: Family Medicine

## 2014-01-14 NOTE — Telephone Encounter (Signed)
Last office visit 12/04/2013.  Ok to refill?

## 2014-01-15 ENCOUNTER — Ambulatory Visit (INDEPENDENT_AMBULATORY_CARE_PROVIDER_SITE_OTHER): Payer: Medicare Other | Admitting: Family Medicine

## 2014-01-15 ENCOUNTER — Other Ambulatory Visit: Payer: Self-pay | Admitting: Family Medicine

## 2014-01-15 ENCOUNTER — Encounter: Payer: Self-pay | Admitting: Family Medicine

## 2014-01-15 VITALS — BP 120/58 | HR 62 | Temp 98.0°F | Ht 62.5 in | Wt 179.0 lb

## 2014-01-15 DIAGNOSIS — N289 Disorder of kidney and ureter, unspecified: Secondary | ICD-10-CM

## 2014-01-15 DIAGNOSIS — D509 Iron deficiency anemia, unspecified: Secondary | ICD-10-CM

## 2014-01-15 NOTE — Progress Notes (Signed)
Subjective:    Patient ID: [name], female    DOB: 1933-05-27, 80 y.o.   MRN: [id number]  HPI  80 year old female with recent severe anemia ( Hg 6.8) on routine labs presents for eval.  She reports no blood in stool. No abdominal pain. No dizziness. Had been fatigued.  no chest pain. No hematuria, no gums bleeding.  She has recently started using ibuprofen again for foot pain.   She has been taking iron sulfate 1-2 a day for past few years. She has had similar anemia in 2013 thought to be due to ibuprofen use, had heme positive stools in 2009, nml colonoscopy, evidence of gastris on upper endoscopy. 04/2011. No clear source. Transfusion again for Hg 7.8 in 5.2013. On ferrous sulfate 3 tabs daily now for 1 month. Hemeoccult cards negative.  She has not had any bleeding that she knows of. No bleeding with urination and no bleeding per her bowels.  Her last colonoscopy in August of 2009 had only a diminutive polyp. She did have some gastritis on upper endoscopy. Of note, the patient has been taking some Aleve recently.  Tolerating the 3 tabs a day of iron.    She feels more energetic, better overall since transfusion of 2 units prbc on 4/17   She has picked up stool cards but has not completed them yet.      Review of Systems  Constitutional: Positive for fatigue. Negative for fever.  HENT: Negative for ear pain.   Eyes: Negative for pain.  Respiratory: Negative for chest tightness and shortness of breath.   Cardiovascular: Negative for chest pain, palpitations and leg swelling.  Gastrointestinal: Negative for abdominal pain.  Genitourinary: Negative for dysuria.       Objective:   Physical Exam  Constitutional: Vital signs are normal. She appears well-developed and well-nourished. She is cooperative.  Non-toxic appearance. She does not appear ill. No distress.  Elderly female in NAD  HENT:  Head: Normocephalic.  Right Ear: Hearing, tympanic membrane, external  ear and ear canal normal. Tympanic membrane is not erythematous, not retracted and not bulging.  Left Ear: Hearing, tympanic membrane, external ear and ear canal normal. Tympanic membrane is not erythematous, not retracted and not bulging.  Nose: No mucosal edema or rhinorrhea. Right sinus exhibits no maxillary sinus tenderness and no frontal sinus tenderness. Left sinus exhibits no maxillary sinus tenderness and no frontal sinus tenderness.  Mouth/Throat: Uvula is midline, oropharynx is clear and moist and mucous membranes are normal.  Eyes: Conjunctivae, EOM and lids are normal. Pupils are equal, round, and reactive to light. Lids are everted and swept, no foreign bodies found.  Neck: Trachea normal and normal range of motion. Neck supple. Carotid bruit is not present. No mass and no thyromegaly present.  Cardiovascular: Normal rate, regular rhythm, S1 normal, S2 normal, normal heart sounds, intact distal pulses and normal pulses.  Exam reveals no gallop and no friction rub.   No murmur heard. Pulmonary/Chest: Effort normal and breath sounds normal. Not tachypneic. No respiratory distress. She has no decreased breath sounds. She has no wheezes. She has no rhonchi. She has no rales.  Abdominal: Soft. Normal appearance and bowel sounds are normal. There is no tenderness.  Neurological: She is alert.  Skin: Skin is warm, dry and intact. No rash noted.  Psychiatric: Her speech is normal and behavior is normal. Judgment and thought content normal. Her mood appears not anxious. Cognition and memory are normal. She does not  exhibit a depressed mood.          Assessment & Plan:

## 2014-01-15 NOTE — Patient Instructions (Signed)
Return for iron, erythropoeitin and cbc in 3-4 weeks. Return the stool cards. Call if any new symptoms.

## 2014-01-15 NOTE — Telephone Encounter (Signed)
Called to [facility] [address] 

## 2014-01-15 NOTE — Progress Notes (Signed)
Pre visit review using our clinic review tool, if applicable. No additional management support is needed unless otherwise documented below in the visit note. 

## 2014-02-01 NOTE — Assessment & Plan Note (Addendum)
Likely due to blood loss in stool secondary to ibuprofen use. In past work up for similar issue with endoscopy showed gastritis, colonoscopy otherwise unrevealing. Pt has stopped the ibuprofen. No current abdominal pain. Return for iron, erythropoeitin and cbc in 3-4 weeks. Return the stool cards. If positive start PPI. Call if any new symptoms.

## 2014-02-01 NOTE — Assessment & Plan Note (Signed)
May be contributing to anemia with decrease erythropoietin.

## 2014-02-04 ENCOUNTER — Other Ambulatory Visit: Payer: Self-pay | Admitting: Family Medicine

## 2014-02-06 ENCOUNTER — Other Ambulatory Visit: Payer: Medicare Other

## 2014-02-13 ENCOUNTER — Other Ambulatory Visit: Payer: Medicare Other

## 2014-02-19 NOTE — Addendum Note (Signed)
Addended by: Carter Kitten on: 02/19/2014 01:50 PM   Modules accepted: Orders

## 2014-02-20 ENCOUNTER — Other Ambulatory Visit: Payer: Self-pay | Admitting: Family Medicine

## 2014-02-20 ENCOUNTER — Other Ambulatory Visit: Payer: Medicare Other

## 2014-02-21 ENCOUNTER — Other Ambulatory Visit: Payer: Self-pay | Admitting: Cardiology

## 2014-02-21 NOTE — Telephone Encounter (Signed)
Last office visit 01/15/2014.  Last refilled  01/15/2014 for #30.  Ok to refill?

## 2014-02-22 NOTE — Telephone Encounter (Signed)
Called to [facility] [address] 

## 2014-03-03 ENCOUNTER — Other Ambulatory Visit: Payer: Self-pay | Admitting: Family Medicine

## 2014-03-07 ENCOUNTER — Other Ambulatory Visit: Payer: Self-pay | Admitting: Cardiology

## 2014-03-08 NOTE — Telephone Encounter (Signed)
Check for refill, hasn't been seen since 2013

## 2014-03-11 ENCOUNTER — Other Ambulatory Visit (INDEPENDENT_AMBULATORY_CARE_PROVIDER_SITE_OTHER): Payer: Medicare Other

## 2014-03-11 ENCOUNTER — Encounter: Payer: Self-pay | Admitting: Radiology

## 2014-03-11 DIAGNOSIS — N289 Disorder of kidney and ureter, unspecified: Secondary | ICD-10-CM

## 2014-03-11 DIAGNOSIS — D509 Iron deficiency anemia, unspecified: Secondary | ICD-10-CM

## 2014-03-11 LAB — CBC WITH DIFFERENTIAL/PLATELET
BASOS ABS: 0 10*3/uL (ref 0.0–0.1)
BASOS PCT: 0.7 % (ref 0.0–3.0)
EOS ABS: 0.7 10*3/uL (ref 0.0–0.7)
Eosinophils Relative: 10.9 % — ABNORMAL HIGH (ref 0.0–5.0)
HCT: 26.2 % — ABNORMAL LOW (ref 36.0–46.0)
Hemoglobin: 8.8 g/dL — ABNORMAL LOW (ref 12.0–15.0)
Lymphocytes Relative: 19.3 % (ref 12.0–46.0)
Lymphs Abs: 1.3 10*3/uL (ref 0.7–4.0)
MCHC: 33.5 g/dL (ref 30.0–36.0)
MCV: 91.3 fl (ref 78.0–100.0)
MONO ABS: 0.3 10*3/uL (ref 0.1–1.0)
Monocytes Relative: 4.8 % (ref 3.0–12.0)
NEUTROS ABS: 4.3 10*3/uL (ref 1.4–7.7)
NEUTROS PCT: 64.3 % (ref 43.0–77.0)
Platelets: 192 10*3/uL (ref 150.0–400.0)
RBC: 2.87 Mil/uL — AB (ref 3.87–5.11)
RDW: 17.1 % — ABNORMAL HIGH (ref 11.5–15.5)
WBC: 6.6 10*3/uL (ref 4.0–10.5)

## 2014-03-11 LAB — IBC PANEL
Iron: 137 ug/dL (ref 42–145)
Saturation Ratios: 37.2 % (ref 20.0–50.0)
TRANSFERRIN: 263.4 mg/dL (ref 212.0–360.0)

## 2014-03-11 LAB — FERRITIN: Ferritin: 62.5 ng/mL (ref 10.0–291.0)

## 2014-03-13 ENCOUNTER — Encounter: Payer: Self-pay | Admitting: *Deleted

## 2014-03-13 LAB — ERYTHROPOIETIN: Erythropoietin: 30.3 m[IU]/mL — ABNORMAL HIGH (ref 2.6–18.5)

## 2014-03-21 ENCOUNTER — Encounter: Payer: Self-pay | Admitting: Cardiology

## 2014-03-24 ENCOUNTER — Other Ambulatory Visit: Payer: Self-pay | Admitting: Family Medicine

## 2014-03-25 NOTE — Telephone Encounter (Signed)
Last office visit 01/15/2014.  Last refilled 02/22/2014 for #30.  Ok to refill?

## 2014-03-26 NOTE — Telephone Encounter (Signed)
Called to [facility] [address] 

## 2014-04-01 ENCOUNTER — Encounter: Payer: Self-pay | Admitting: Family Medicine

## 2014-04-02 ENCOUNTER — Other Ambulatory Visit: Payer: Self-pay | Admitting: Cardiology

## 2014-04-15 ENCOUNTER — Other Ambulatory Visit: Payer: Self-pay | Admitting: Family Medicine

## 2014-04-15 MED ORDER — GABAPENTIN 100 MG PO CAPS: ORAL_CAPSULE | ORAL | Status: DC

## 2014-04-15 NOTE — Telephone Encounter (Signed)
Last office visit 01/15/2014.  Last refilled 12/04/2013 for #90 with 3 refills.  Ok to refill?

## 2014-04-15 NOTE — Addendum Note (Signed)
Addended by: Carter Kitten on: 04/15/2014 09:40 AM   Modules accepted: Orders

## 2014-04-22 ENCOUNTER — Other Ambulatory Visit: Payer: Self-pay | Admitting: *Deleted

## 2014-04-22 MED ORDER — METOPROLOL TARTRATE 25 MG PO TABS: ORAL_TABLET | ORAL | Status: DC

## 2014-04-24 ENCOUNTER — Other Ambulatory Visit: Payer: Self-pay | Admitting: Family Medicine

## 2014-04-28 ENCOUNTER — Other Ambulatory Visit: Payer: Self-pay | Admitting: Family Medicine

## 2014-04-29 NOTE — Telephone Encounter (Signed)
Last office visit 01/15/2014.  Last refilled 03/26/2014 for #30 with no refills.  Ok to refill?

## 2014-04-30 NOTE — Telephone Encounter (Signed)
Called to [facility] [address] 

## 2014-05-14 ENCOUNTER — Other Ambulatory Visit: Payer: Self-pay | Admitting: Cardiology

## 2014-05-22 ENCOUNTER — Ambulatory Visit: Payer: Medicare Other | Admitting: Cardiology

## 2014-05-29 ENCOUNTER — Other Ambulatory Visit: Payer: Self-pay | Admitting: Family Medicine

## 2014-05-30 ENCOUNTER — Other Ambulatory Visit: Payer: Self-pay | Admitting: Family Medicine

## 2014-05-30 NOTE — Telephone Encounter (Signed)
Last office visit 01/15/2014.  Last refilled 04/30/2014 for #30 with no refills.  Ok to refill?

## 2014-05-31 NOTE — Telephone Encounter (Signed)
Called to [facility] [address] 

## 2014-06-08 ENCOUNTER — Other Ambulatory Visit: Payer: Self-pay | Admitting: Cardiology

## 2014-06-17 ENCOUNTER — Other Ambulatory Visit: Payer: Self-pay | Admitting: Family Medicine

## 2014-06-17 DIAGNOSIS — D509 Iron deficiency anemia, unspecified: Secondary | ICD-10-CM

## 2014-06-18 ENCOUNTER — Other Ambulatory Visit: Payer: Self-pay | Admitting: Cardiology

## 2014-06-19 ENCOUNTER — Other Ambulatory Visit: Payer: Medicare Other

## 2014-06-21 ENCOUNTER — Other Ambulatory Visit (INDEPENDENT_AMBULATORY_CARE_PROVIDER_SITE_OTHER): Payer: Medicare Other

## 2014-06-21 DIAGNOSIS — D509 Iron deficiency anemia, unspecified: Secondary | ICD-10-CM

## 2014-06-21 LAB — CBC WITH DIFFERENTIAL/PLATELET
Basophils Absolute: 0.1 10*3/uL (ref 0.0–0.1)
Basophils Relative: 0.8 % (ref 0.0–3.0)
EOS ABS: 0.4 10*3/uL (ref 0.0–0.7)
Eosinophils Relative: 6.4 % — ABNORMAL HIGH (ref 0.0–5.0)
HCT: 31 % — ABNORMAL LOW (ref 36.0–46.0)
HEMOGLOBIN: 10.9 g/dL — AB (ref 12.0–15.0)
Lymphocytes Relative: 23.4 % (ref 12.0–46.0)
Lymphs Abs: 1.5 10*3/uL (ref 0.7–4.0)
MCHC: 35.2 g/dL (ref 30.0–36.0)
MCV: 91.6 fl (ref 78.0–100.0)
MONO ABS: 0.4 10*3/uL (ref 0.1–1.0)
Monocytes Relative: 5.9 % (ref 3.0–12.0)
NEUTROS ABS: 4.1 10*3/uL (ref 1.4–7.7)
Neutrophils Relative %: 63.5 % (ref 43.0–77.0)
Platelets: 247 10*3/uL (ref 150.0–400.0)
RBC: 3.38 Mil/uL — ABNORMAL LOW (ref 3.87–5.11)
RDW: 15.8 % — AB (ref 11.5–15.5)
WBC: 6.4 10*3/uL (ref 4.0–10.5)

## 2014-06-24 ENCOUNTER — Other Ambulatory Visit: Payer: Self-pay | Admitting: Cardiology

## 2014-06-26 ENCOUNTER — Ambulatory Visit (INDEPENDENT_AMBULATORY_CARE_PROVIDER_SITE_OTHER): Payer: Medicare Other | Admitting: Cardiology

## 2014-06-26 ENCOUNTER — Encounter: Payer: Self-pay | Admitting: Cardiology

## 2014-06-26 VITALS — BP 140/68 | HR 72 | Ht 62.5 in | Wt 183.0 lb

## 2014-06-26 DIAGNOSIS — I209 Angina pectoris, unspecified: Secondary | ICD-10-CM

## 2014-06-26 DIAGNOSIS — I2510 Atherosclerotic heart disease of native coronary artery without angina pectoris: Secondary | ICD-10-CM

## 2014-06-26 DIAGNOSIS — I10 Essential (primary) hypertension: Secondary | ICD-10-CM

## 2014-06-26 DIAGNOSIS — IMO0001 Reserved for inherently not codable concepts without codable children: Secondary | ICD-10-CM

## 2014-06-26 DIAGNOSIS — I25119 Atherosclerotic heart disease of native coronary artery with unspecified angina pectoris: Secondary | ICD-10-CM

## 2014-06-26 DIAGNOSIS — E1165 Type 2 diabetes mellitus with hyperglycemia: Secondary | ICD-10-CM

## 2014-06-26 DIAGNOSIS — E1365 Other specified diabetes mellitus with hyperglycemia: Secondary | ICD-10-CM

## 2014-06-26 DIAGNOSIS — E1349 Other specified diabetes mellitus with other diabetic neurological complication: Secondary | ICD-10-CM

## 2014-06-26 DIAGNOSIS — IMO0002 Reserved for concepts with insufficient information to code with codable children: Secondary | ICD-10-CM

## 2014-06-26 DIAGNOSIS — D509 Iron deficiency anemia, unspecified: Secondary | ICD-10-CM

## 2014-06-26 DIAGNOSIS — I119 Hypertensive heart disease without heart failure: Secondary | ICD-10-CM

## 2014-06-26 DIAGNOSIS — E7800 Pure hypercholesterolemia, unspecified: Secondary | ICD-10-CM

## 2014-06-26 MED ORDER — METOPROLOL TARTRATE 25 MG PO TABS: ORAL_TABLET | ORAL | Status: DC

## 2014-06-26 MED ORDER — TRIAMTERENE-HCTZ 37.5-25 MG PO CAPS: ORAL_CAPSULE | ORAL | Status: DC

## 2014-06-26 NOTE — Assessment & Plan Note (Signed)
The patient has diabetes mellitus with diabetic neuropathy.  She is on gabapentin.  She states that her A1c is satisfactory.  She has not had a severe hypoglycemic episodes.

## 2014-06-26 NOTE — Assessment & Plan Note (Signed)
Blood pressures remaining stable on current therapy.  No dizziness or syncope.

## 2014-06-26 NOTE — Patient Instructions (Signed)
Your physician recommends that you continue on your current medications as directed. Please refer to the Current Medication list given to you today.  Your physician wants you to follow-up in: 1 year ov/ekg You will receive a reminder letter in the mail two months in advance. If you don't receive a letter, please call our office to schedule the follow-up appointment.  

## 2014-06-26 NOTE — Progress Notes (Signed)
Sid Falcon Date of Birth:  05/19/1933 [place] [address] [place], [place]  [address] [phone]        Fax   [phone]   History of Present Illness: This pleasant 80 year old Caucasian female is seen for a scheduled followup office visit.  We last saw her in April 2013. She has a history of ischemic heart disease. She underwent coronary artery bypass graft surgery following an acute myocardial infarction and surgery was done on 12/22/06. She has not had any subsequent angina pectoris. She does have exertional dyspnea. He had an echocardiogram on 06/04/09 which showed an ejection fraction of 55-60% and showed impaired relaxation. She also has mild to moderate aortic stenosis and trace tricuspid regurgitation.  She has a LexiScan Myoview stress test on 01/25/12 which showed a small inferior wall scar and no ischemia and her ejection fraction was 72%.  The patient has a history of hypercholesterolemia and a history of diabetes mellitus with diabetic neuropathy.  Her PCP is Dr. Diona Browner  Current Outpatient Prescriptions  Medication Sig Dispense Refill  . aspirin 81 MG tablet Take 81 mg by mouth daily.        Marland Kitchen atorvastatin (LIPITOR) 40 MG tablet TAKE 1 TABLET BY MOUTH EVERY DAY  90 tablet  1  . CINNAMON PO Take 2,000 mg by mouth 2 (two) times daily.      . cyanocobalamin 1000 MCG tablet Take 100 mcg by mouth 2 (two) times daily.      . Ferrous Sulfate (IRON) 325 (65 FE) MG TABS Take 1 tablet by mouth 3 (three) times daily.       Marland Kitchen gabapentin (NEURONTIN) 100 MG capsule TAKE 1 CAPSULE (100 MG TOTAL) BY MOUTH 3 (THREE) TIMES DAILY.  90 capsule  3  . glipiZIDE (GLUCOTROL XL) 10 MG 24 hr tablet TAKE 1 TABLET (10 MG TOTAL) BY MOUTH DAILY.  90 tablet  1  . glucose blood test strip Check fasting blood sugar daily and check 2 hours after a meal once daily.  Dx: 250.00      . levothyroxine (SYNTHROID, LEVOTHROID) 50 MCG tablet TAKE 1 TABLET BY MOUTH EVERY DAY  30  tablet  11  . losartan (COZAAR) 50 MG tablet TAKE 1 TABLET BY MOUTH EVERY DAY  30 tablet  5  . metoprolol tartrate (LOPRESSOR) 25 MG tablet TAKE 1 TABLET BY MOUTH TWICE A DAY  180 tablet  3  . Multiple Vitamins-Minerals (CENTRUM CARDIO PO) Take by mouth 2 (two) times daily.        Marland Kitchen NITROSTAT 0.4 MG SL tablet PLACE 1 TABLET (0.4 MG TOTAL) UNDER THE TONGUE EVERY 5 (FIVE) MINUTES AS NEEDED FOR CHEST PAIN.  25 tablet  0  . traMADol (ULTRAM) 50 MG tablet TAKE 1 TABLET BY MOUTH AT BEDTIME  30 tablet  0  . triamterene-hydrochlorothiazide (DYAZIDE) 37.5-25 MG per capsule TAKE ONE CAPSULE EVERY DAY  90 capsule  3   No current facility-administered medications for this visit.    Allergies  Allergen Reactions  . Morphine And Related     Patient Active Problem List   Diagnosis Date Noted  . DM (diabetes mellitus), secondary, uncontrolled, with neurologic complications 37/34/2876    Priority: Medium  . HTN (hypertension) 01/26/2011    Priority: Medium  . CAD (coronary artery disease) 01/26/2011    Priority: Medium  . Diabetic peripheral autonomic neuropathy 12/04/2013  . Unspecified vitamin D deficiency 08/03/2012  . Renal insufficiency 03/24/2012  .  Hypothyroidism 01/26/2011  . High cholesterol 01/26/2011  . Iron deficiency anemia 01/26/2011  . Breast cancer 01/26/2011  . Uterine cancer 01/26/2011  . Allergic rhinitis 01/26/2011  . Peripheral neuropathy 01/26/2011    History  Smoking status  . Never Smoker   Smokeless tobacco  . Never Used    History  Alcohol Use No    Family History  Problem Relation Age of Onset  . Cancer Mother     ? stomach cancer  . Diabetes Mother   . Heart disease Father 1  . Diabetes Brother   . Throat cancer Brother     Review of Systems: Constitutional: no fever chills diaphoresis or fatigue or change in weight.  Head and neck: no hearing loss, no epistaxis, no photophobia or visual disturbance. Respiratory: No cough, shortness of breath or  wheezing. Cardiovascular: No chest pain peripheral edema, palpitations. Gastrointestinal: No abdominal distention, no abdominal pain, no change in bowel habits hematochezia or melena. Genitourinary: No dysuria, no frequency, no urgency, no nocturia. Musculoskeletal:No arthralgias, no back pain, no gait disturbance or myalgias. Neurological: No dizziness, no headaches, no numbness, no seizures, no syncope, no weakness, no tremors. Hematologic: No lymphadenopathy, no easy bruising. Psychiatric: No confusion, no hallucinations, no sleep disturbance.    Physical Exam: Filed Vitals:   06/26/14 0737  BP: 140/68  Pulse: 72  The patient appears to be in no distress.  Head and neck exam reveals that the pupils are equal and reactive.  The extraocular movements are full.  There is no scleral icterus.  Mouth and pharynx are benign.  No lymphadenopathy.  No carotid bruits.  The jugular venous pressure is normal.  Thyroid is not enlarged or tender.  Chest is clear to percussion and auscultation.  No rales or rhonchi.  Expansion of the chest is symmetrical.  Heart reveals no abnormal lift or heave.  First and second heart sounds are normal.  There is grade 1/6 systolic ejection murmur at the base  The abdomen is soft and nontender.  Bowel sounds are normoactive.  There is no hepatosplenomegaly or mass.  There are no abdominal bruits.  Extremities reveal no phlebitis or edema.  Pedal pulses are good.  There is no cyanosis or clubbing.  Neurologic exam is normal strength and no lateralizing weakness.  No sensory deficits.  Integument reveals no rash  EKG demonstrates normal sinus rhythm with first degree AV block and occasional PVCs.  Since 01/24/12, no significant change  Assessment / Plan: 1. ischemic heart disease status post CABG 12/22/06 2. hypertensive heart disease without heart failure 3. diabetes mellitus with diabetic neuropathy 4. chronic iron deficiency anemia 5.  Hypercholesterolemia  Disposition: Continue current medication.  We refilled her cardiac medications today.  Return in one year for office visit and EKG

## 2014-06-26 NOTE — Assessment & Plan Note (Signed)
The patient is not having any chest pain or angina pectoris.  She has not had to use any recent sublingual nitroglycerin.  She does have some shortness of breath with exertion.  She has been attending the [employer] program which is held at her [employer] which is [employer].

## 2014-06-26 NOTE — Assessment & Plan Note (Signed)
The patient has a history of iron deficiency anemia and is on oral iron monitored by her PCP

## 2014-06-27 ENCOUNTER — Telehealth: Payer: Self-pay | Admitting: *Deleted

## 2014-06-27 NOTE — Telephone Encounter (Signed)
Left mess for patient to call back to schedule Medicare Wellness visit with PCP.

## 2014-07-16 ENCOUNTER — Other Ambulatory Visit: Payer: Self-pay | Admitting: Family Medicine

## 2014-07-16 NOTE — Telephone Encounter (Signed)
Last office visit 01/15/2014.  Last refilled 05/31/2014 for #30 with no refills.  Ok to refill?

## 2014-07-16 NOTE — Telephone Encounter (Signed)
Called to [facility] [address] 

## 2014-08-14 ENCOUNTER — Other Ambulatory Visit: Payer: Self-pay | Admitting: Family Medicine

## 2014-08-14 NOTE — Telephone Encounter (Signed)
Last office visit 01/15/2014.  Had Medicare Wellness 12/04/2013.  No future appointment scheduled.  Ok to refill?

## 2014-09-02 ENCOUNTER — Other Ambulatory Visit: Payer: Self-pay | Admitting: Family Medicine

## 2014-09-02 NOTE — Telephone Encounter (Signed)
Last office visit 01/15/2014.  Last A1c 01/09/2014.  No future appointments scheduled. Ok to refill?

## 2014-09-03 NOTE — Telephone Encounter (Signed)
Please schedule DM follow up with fasting labs prior with Dr. Bedsole. 

## 2014-09-03 NOTE — Telephone Encounter (Signed)
Needs appt for 6 month follow up labs prior, will refill x 60 days.

## 2014-09-04 NOTE — Telephone Encounter (Signed)
Home # vm full. LVM 09/04/14 on cell phone for pt to call back and schedule follow up with labs prior to continue having med refilled

## 2014-10-11 ENCOUNTER — Telehealth: Payer: Self-pay | Admitting: Family Medicine

## 2014-10-11 ENCOUNTER — Emergency Department (HOSPITAL_COMMUNITY)
Admission: EM | Admit: 2014-10-11 | Discharge: 2014-10-11 | Disposition: A | Discharge status: Home / Self Care | Payer: Medicare Other | Attending: Emergency Medicine | Admitting: Emergency Medicine

## 2014-10-11 ENCOUNTER — Emergency Department (HOSPITAL_COMMUNITY): Payer: Medicare Other

## 2014-10-11 ENCOUNTER — Encounter (HOSPITAL_COMMUNITY): Payer: Self-pay

## 2014-10-11 DIAGNOSIS — E785 Hyperlipidemia, unspecified: Secondary | ICD-10-CM | POA: Diagnosis not present

## 2014-10-11 DIAGNOSIS — E1165 Type 2 diabetes mellitus with hyperglycemia: Secondary | ICD-10-CM | POA: Insufficient documentation

## 2014-10-11 DIAGNOSIS — Z7982 Long term (current) use of aspirin: Secondary | ICD-10-CM | POA: Diagnosis not present

## 2014-10-11 DIAGNOSIS — Z79899 Other long term (current) drug therapy: Secondary | ICD-10-CM | POA: Insufficient documentation

## 2014-10-11 DIAGNOSIS — I10 Essential (primary) hypertension: Secondary | ICD-10-CM | POA: Diagnosis not present

## 2014-10-11 DIAGNOSIS — R05 Cough: Secondary | ICD-10-CM | POA: Diagnosis not present

## 2014-10-11 DIAGNOSIS — E079 Disorder of thyroid, unspecified: Secondary | ICD-10-CM | POA: Insufficient documentation

## 2014-10-11 DIAGNOSIS — B9689 Other specified bacterial agents as the cause of diseases classified elsewhere: Secondary | ICD-10-CM | POA: Diagnosis not present

## 2014-10-11 DIAGNOSIS — N390 Urinary tract infection, site not specified: Secondary | ICD-10-CM

## 2014-10-11 DIAGNOSIS — Z872 Personal history of diseases of the skin and subcutaneous tissue: Secondary | ICD-10-CM | POA: Insufficient documentation

## 2014-10-11 DIAGNOSIS — I2510 Atherosclerotic heart disease of native coronary artery without angina pectoris: Secondary | ICD-10-CM | POA: Insufficient documentation

## 2014-10-11 DIAGNOSIS — R531 Weakness: Secondary | ICD-10-CM | POA: Diagnosis not present

## 2014-10-11 DIAGNOSIS — R739 Hyperglycemia, unspecified: Secondary | ICD-10-CM | POA: Diagnosis not present

## 2014-10-11 DIAGNOSIS — Z9889 Other specified postprocedural states: Secondary | ICD-10-CM | POA: Insufficient documentation

## 2014-10-11 DIAGNOSIS — Z951 Presence of aortocoronary bypass graft: Secondary | ICD-10-CM | POA: Diagnosis not present

## 2014-10-11 DIAGNOSIS — R059 Cough, unspecified: Secondary | ICD-10-CM

## 2014-10-11 LAB — CBC WITH DIFFERENTIAL/PLATELET
Basophils Absolute: 0.1 10*3/uL (ref 0.0–0.1)
Basophils Relative: 1 % (ref 0–1)
Eosinophils Absolute: 0.3 10*3/uL (ref 0.0–0.7)
Eosinophils Relative: 3 % (ref 0–5)
HCT: 37.9 % (ref 36.0–46.0)
Hemoglobin: 13.4 g/dL (ref 12.0–15.0)
Lymphocytes Relative: 23 % (ref 12–46)
Lymphs Abs: 2.3 10*3/uL (ref 0.7–4.0)
MCH: 32 pg (ref 26.0–34.0)
MCHC: 35.4 g/dL (ref 30.0–36.0)
MCV: 90.5 fL (ref 78.0–100.0)
Monocytes Absolute: 0.6 10*3/uL (ref 0.1–1.0)
Monocytes Relative: 6 % (ref 3–12)
NEUTROS ABS: 6.7 10*3/uL (ref 1.7–7.7)
Neutrophils Relative %: 67 % (ref 43–77)
Platelets: 225 10*3/uL (ref 150–400)
RBC: 4.19 MIL/uL (ref 3.87–5.11)
RDW: 14.3 % (ref 11.5–15.5)
WBC: 9.9 10*3/uL (ref 4.0–10.5)

## 2014-10-11 LAB — COMPREHENSIVE METABOLIC PANEL
ALT: 23 U/L (ref 0–35)
AST: 36 U/L (ref 0–37)
Albumin: 3.8 g/dL (ref 3.5–5.2)
Alkaline Phosphatase: 94 U/L (ref 39–117)
Anion gap: 14 (ref 5–15)
BUN: 34 mg/dL — ABNORMAL HIGH (ref 6–23)
CALCIUM: 9.6 mg/dL (ref 8.4–10.5)
CO2: 22 mmol/L (ref 19–32)
Chloride: 97 mEq/L (ref 96–112)
Creatinine, Ser: 1.51 mg/dL — ABNORMAL HIGH (ref 0.50–1.10)
GFR calc non Af Amer: 32 mL/min — ABNORMAL LOW (ref >90)
GFR, EST AFRICAN AMERICAN: 37 mL/min — AB (ref >90)
GLUCOSE: 307 mg/dL — AB (ref 70–99)
Potassium: 4.6 mmol/L (ref 3.5–5.1)
Sodium: 133 mmol/L — ABNORMAL LOW (ref 135–145)
TOTAL PROTEIN: 7.5 g/dL (ref 6.0–8.3)
Total Bilirubin: 1.1 mg/dL (ref 0.3–1.2)

## 2014-10-11 LAB — URINALYSIS, ROUTINE W REFLEX MICROSCOPIC
Bilirubin Urine: NEGATIVE
Glucose, UA: 250 mg/dL — AB
Ketones, ur: NEGATIVE mg/dL
Nitrite: POSITIVE — AB
Protein, ur: 100 mg/dL — AB
Specific Gravity, Urine: 1.016 (ref 1.005–1.030)
Urobilinogen, UA: 0.2 mg/dL (ref 0.0–1.0)
pH: 5 (ref 5.0–8.0)

## 2014-10-11 LAB — URINE MICROSCOPIC-ADD ON

## 2014-10-11 LAB — CBG MONITORING, ED
GLUCOSE-CAPILLARY: 277 mg/dL — AB (ref 70–99)
Glucose-Capillary: 292 mg/dL — ABNORMAL HIGH (ref 70–99)

## 2014-10-11 LAB — LIPASE, BLOOD: Lipase: 47 U/L (ref 11–59)

## 2014-10-11 MED ORDER — DEXTROSE 5 % IV SOLN
1.0000 g | Freq: Once | INTRAVENOUS | Status: AC
  Administered 2014-10-11: 1 g via INTRAVENOUS
  Filled 2014-10-11: qty 10

## 2014-10-11 MED ORDER — SITAGLIPTIN PHOSPHATE 50 MG PO TABS: 50.0000 mg | ORAL_TABLET | Freq: Every day | ORAL | Status: DC

## 2014-10-11 MED ORDER — CEPHALEXIN 500 MG PO CAPS: 500.0000 mg | ORAL_CAPSULE | Freq: Four times a day (QID) | ORAL | Status: DC

## 2014-10-11 MED ORDER — SODIUM CHLORIDE 0.9 % IV BOLUS (SEPSIS)
1000.0000 mL | Freq: Once | INTRAVENOUS | Status: AC
Start: 2014-10-11 — End: 2014-10-11
  Administered 2014-10-11: 1000 mL via INTRAVENOUS

## 2014-10-11 NOTE — Telephone Encounter (Signed)
Windy with [facility] said pt was vomiting > 5 times each day on 10/06/14 and 10/07/14; ? If pt keeping meds down and not monitoring BS. On 10/10/14 BS was 400; this AM earlier FBS was 302 pts daughter described pt as "loopy", tired and sleepy. Now pts FBS is 431 but alert and orientated.Pt has not eaten or taken Glipizide today. No dizziness and no vomiting since 10/08/14. Dr Diona Browner is concerned with previous vomiting and now elevated BS and advised pt should be evaluated at ED. Windy voiced understanding.

## 2014-10-11 NOTE — ED Provider Notes (Signed)
CSN: [id number]     Arrival date & time 10/11/14  1104 History   First MD Initiated Contact with Patient 10/11/14 1136     Chief Complaint  Patient presents with  . Hyperglycemia     (Consider location/radiation/quality/duration/timing/severity/associated sxs/prior Treatment) The history is provided by the patient.  [name] is a 81 y.o. female hx of diabetes, CAD here presenting with hyperglycemia. She had several episodes of nausea vomiting 6 days ago and resolved 4 days ago. She was unable to keep her medicine down at that time. Denies any abdominal pain or diarrhea. The vomiting resolved and she was able to keep her medicines down since then. Today she checked her blood sugar and was 400. Denies any fevers or chills. She has been coughing nonproductively for the last 2 weeks.    Past Medical History  Diagnosis Date  . Cellulitis     R ARM  . Coronary artery disease   . Thyroid disease   . Hyperlipidemia   . Hypertension    Past Surgical History  Procedure Laterality Date  . Total abdominal hysterectomy  1993  . Mastectomy  1979    right, followed by 2 years chemo  . Total hip arthroplasty  08/2009    right  . Appendectomy    . US echocardiography  05/2009    EF 55-60%,MILD-MOD LVH,, MILD-MOD A STENOSIS,  . Nuclear stress test  07/2009    EF 69%,  MILD ISCHEMIA IN INFEROLATERAL WALL  . Coronary artery bypass graft  2008  . Cardiac catheterization     Family History  Problem Relation Age of Onset  . Cancer Mother     ? stomach cancer  . Diabetes Mother   . Heart disease Father 78  . Diabetes Brother   . Throat cancer Brother    History  Substance Use Topics  . Smoking status: Never Smoker   . Smokeless tobacco: Never Used  . Alcohol Use: No   OB History    No data available     Review of Systems  Gastrointestinal: Positive for vomiting.  All other systems reviewed and are negative.     Allergies  Morphine and related  Home Medications    Prior to Admission medications   Medication Sig Start Date End Date Taking? Authorizing Provider  aspirin 81 MG tablet Take 81 mg by mouth daily.     Yes Historical Provider, MD  atorvastatin (LIPITOR) 40 MG tablet Take 40 mg by mouth daily.   Yes Historical Provider, MD  CINNAMON PO Take 2,000 mg by mouth 2 (two) times daily.   Yes Historical Provider, MD  cyanocobalamin 1000 MCG tablet Take 100 mcg by mouth 2 (two) times daily.   Yes Historical Provider, MD  Ferrous Sulfate (IRON) 325 (65 FE) MG TABS Take 1 tablet by mouth 3 (three) times daily.    Yes Historical Provider, MD  gabapentin (NEURONTIN) 100 MG capsule Take 100 mg by mouth 3 (three) times daily as needed (for pain).    Yes Historical Provider, MD  glipiZIDE (GLUCOTROL XL) 10 MG 24 hr tablet Take 10 mg by mouth daily with breakfast.   Yes Historical Provider, MD  levothyroxine (SYNTHROID, LEVOTHROID) 50 MCG tablet Take 50 mcg by mouth daily before breakfast.   Yes Historical Provider, MD  losartan (COZAAR) 50 MG tablet Take 50 mg by mouth daily.   Yes Historical Provider, MD  metoprolol succinate (TOPROL-XL) 25 MG 24 hr tablet Take 25 mg by mouth 2 (  two) times daily.   Yes Historical Provider, MD  nitroGLYCERIN (NITROSTAT) 0.4 MG SL tablet Place 0.4 mg under the tongue every 5 (five) minutes as needed for chest pain.   Yes Historical Provider, MD  traMADol (ULTRAM) 50 MG tablet Take 50 mg by mouth at bedtime.   Yes Historical Provider, MD  triamterene-hydrochlorothiazide (MAXZIDE-25) 37.5-25 MG per tablet Take 1 tablet by mouth daily.   Yes Historical Provider, MD  atorvastatin (LIPITOR) 40 MG tablet TAKE 1 TABLET BY MOUTH EVERY DAY Patient not taking: Reported on 10/11/2014 05/29/14   Amy E Diona Browner, MD  cephALEXin (KEFLEX) 500 MG capsule Take 1 capsule (500 mg total) by mouth 4 (four) times daily. 10/11/14   Wandra Arthurs, MD  gabapentin (NEURONTIN) 100 MG capsule TAKE 1 CAPSULE (100 MG TOTAL) BY MOUTH 3 (THREE) TIMES DAILY. Patient  not taking: Reported on 10/11/2014 04/15/14   Amy E Diona Browner, MD  glipiZIDE (GLUCOTROL XL) 10 MG 24 hr tablet TAKE 1 TABLET (10 MG TOTAL) BY MOUTH DAILY. Patient not taking: Reported on 10/11/2014 09/03/14   Amy E Diona Browner, MD  levothyroxine (SYNTHROID, LEVOTHROID) 50 MCG tablet TAKE 1 TABLET BY MOUTH EVERY DAY Patient not taking: Reported on 10/11/2014 01/15/14   Amy E Diona Browner, MD  losartan (COZAAR) 50 MG tablet TAKE 1 TABLET BY MOUTH EVERY DAY Patient not taking: Reported on 10/11/2014 08/14/14   Amy E Diona Browner, MD  metoprolol tartrate (LOPRESSOR) 25 MG tablet TAKE 1 TABLET BY MOUTH TWICE A DAY Patient not taking: Reported on 10/11/2014 06/26/14   Darlin Coco, MD  NITROSTAT 0.4 MG SL tablet PLACE 1 TABLET (0.4 MG TOTAL) UNDER THE TONGUE EVERY 5 (FIVE) MINUTES AS NEEDED FOR CHEST PAIN. Patient not taking: Reported on 10/11/2014 06/10/14   Darlin Coco, MD  sitaGLIPtin (JANUVIA) 50 MG tablet Take 1 tablet (50 mg total) by mouth daily. 10/11/14   Wandra Arthurs, MD  traMADol (ULTRAM) 50 MG tablet TAKE 1 TABLET BY MOUTH AT BEDTIME Patient not taking: Reported on 10/11/2014 07/16/14   Jinny Sanders, MD  triamterene-hydrochlorothiazide (DYAZIDE) 37.5-25 MG per capsule TAKE ONE CAPSULE EVERY DAY Patient not taking: Reported on 10/11/2014 06/26/14   Darlin Coco, MD   BP 143/66 mmHg  Pulse 64  Temp(Src) 97.8 F (36.6 C) (Oral)  Resp 20  SpO2 99% Physical Exam  Constitutional: She is oriented to person, place, and time.  Chronically ill, slightly dehydrated   HENT:  Head: Normocephalic.  MM slightly dry   Eyes: Conjunctivae are normal. Pupils are equal, round, and reactive to light.  Neck: Normal range of motion. Neck supple.  Cardiovascular: Normal rate, regular rhythm and normal heart sounds.   Pulmonary/Chest: Effort normal and breath sounds normal. No respiratory distress. She has no wheezes. She has no rales.  Abdominal: Soft. Bowel sounds are normal. She exhibits no distension. There is no  tenderness. There is no rebound and no guarding.  Musculoskeletal: Normal range of motion. She exhibits no edema or tenderness.  Neurological: She is alert and oriented to person, place, and time. No cranial nerve deficit. Coordination normal.  Skin: Skin is warm and dry.  Psychiatric: She has a normal mood and affect. Her behavior is normal. Judgment and thought content normal.  Vitals reviewed.   ED Course  Procedures (including critical care time) Labs Review Labs Reviewed  COMPREHENSIVE METABOLIC PANEL - Abnormal; Notable for the following:    Sodium 133 (*)    Glucose, Bld 307 (*)    BUN 34 (*)  Creatinine, Ser 1.51 (*)    GFR calc non Af Amer 32 (*)    GFR calc Af Amer 37 (*)    All other components within normal limits  URINALYSIS, ROUTINE W REFLEX MICROSCOPIC - Abnormal; Notable for the following:    APPearance CLOUDY (*)    Glucose, UA 250 (*)    Hgb urine dipstick SMALL (*)    Protein, ur 100 (*)    Nitrite POSITIVE (*)    Leukocytes, UA MODERATE (*)    All other components within normal limits  URINE MICROSCOPIC-ADD ON - Abnormal; Notable for the following:    Bacteria, UA MANY (*)    All other components within normal limits  CBG MONITORING, ED - Abnormal; Notable for the following:    Glucose-Capillary 292 (*)    All other components within normal limits  CBG MONITORING, ED - Abnormal; Notable for the following:    Glucose-Capillary 277 (*)    All other components within normal limits  LIPASE, BLOOD  CBC WITH DIFFERENTIAL  CBC WITH DIFFERENTIAL    Imaging Review Dg Chest 2 View  10/11/2014   CLINICAL DATA:  Cough, weakness, hyperglycemia  EXAM: CHEST  2 VIEW  COMPARISON:  Radiograph 01/28/2011  FINDINGS: Sternotomy wires overlie stable enlarged cardiac silhouette. No effusion, infiltrate, pneumothorax. Large hiatal hernia noted. Calcification aorta noted.  IMPRESSION: 1. No acute findings. 2. Large hiatal hernia. 3. Atherosclerotic aortic calcification.    Electronically Signed   By: Suzy Bouchard M.D.   On: 10/11/2014 12:21     EKG Interpretation None      MDM   Final diagnoses:  Hyperglycemia  UTI (lower urinary tract infection)    DARRYL BLUMENSTEIN is a 81 y.o. female here with vomiting that resolved, hyperglycemia. I doubt DKA since she is not currently vomiting. Will check labs and hydrate. Has cough for 2 weeks, will get CXR.   3:42 PM Labs showed glucose 300 with nl gap. UA + UTI. cxr showed large hiatal hernia. I discussed with Dr. Diona Browner regarding changes diabetes medications. Given Cr 1.5, Dr. Zachery Dauer doesn't want to start metformin. Patient tried actos but didn't tolerate it in the past and didn't want insulin. She suggests [place] and close follow up. Given ceftriaxone for UTI. Will dc home with keflex.    Wandra Arthurs, MD 10/11/14 [phone]

## 2014-10-11 NOTE — Discharge Instructions (Signed)
Continue taking glipizide.   Check sugar in the morning before breakfast.   Stay hydrated.   Add januvia 50 mg daily.   Take keflex for 5 days for UTI.   See your doctor in a week.   Return to ER if you have vomiting, dehydration, abdominal pain.

## 2014-10-11 NOTE — Telephone Encounter (Signed)
PLEASE NOTE: All timestamps contained within this report are represented as [place] Standard Time. CONFIDENTIALTY NOTICE: This fax transmission is intended only for the addressee. It contains information that is legally privileged, confidential or otherwise protected from use or disclosure. If you are not the intended recipient, you are strictly prohibited from reviewing, disclosing, copying using or disseminating any of this information or taking any action in reliance on or regarding this information. If you have received this fax in error, please notify us immediately by telephone so that we can arrange for its return to [place]. Phone: [phone], Toll-Free: [phone], Fax: [phone] Page: 1 of 2 Call Id: [id number] [place] Patient Name: [name] Gender: Female DOB: 1932-10-30 Age: 81 Y 5 M 7 D Return Phone Number: [phone] (Primary), [phone] (Alternate) Address: [name]/State/Zip: [place] [address] Client [facility] - Client Client Site [facility] - Day Physician Diona Browner, [place] Contact Type Call Call Type Triage / Blaine Name Willette Pa Relationship To Patient Daughter Appointment Disposition EMR Appointment Attempted - Not Scheduled Return Phone Number [phone] (Primary) Chief Complaint Blood Sugar High Initial Comment Caller states her mother has not been taking her meds regularly, is a diabetic, this am her BS is 302, is loopy and tired. PreDisposition Call Doctor Nurse Assessment Nurse: Markus Daft, RN, Sherre Poot Date/Time Eilene Ghazi Time): 10/11/2014 9:01:17 AM Confirm and document reason for call. If symptomatic, describe symptoms. ---Caller states her mother has not been taking her meds regularly, is a diabetic. She's been sick with vomiting all day x 2 days (Sunday and Monday) about > 5  episodes each day, then improved on Tuesday. Noticed yesterday blood sugar was 400. And resumed medication yesterday. This am her fasting blood sugar is 302 at 8:30 am, is loopy and tired. -- Caller is not with the patient. -- RN attempted to reach the pt, but no answer at 9:05 am. -- Caller plans to leave and go to her home to check on the pt now, but lives about 30 min. away. She will try to contact her mother also to let her know the nurse is calling. Has the patient traveled out of the country within the last 30 days? ---Not Applicable Does the patient require triage? ---Yes Related visit to physician within the last 2 weeks? ---No Does the PT have any chronic conditions? (i.e. diabetes, asthma, etc.) ---Yes List chronic conditions. ---NIDDM - Glipizide ER 10 mg QD, HTN, Iron deficiency anemia Guidelines Guideline Title Affirmed Question Affirmed Notes Nurse Date/Time (Eastern Time) Diabetes - High Blood Sugar Blood glucose > 400 mg/ dl (22 mmol/l) Harley, RN, Windy 10/11/2014 9:44:28 AM Disp. Time Eilene Ghazi Time) Disposition Final User 10/11/2014 9:10:13 AM Attempt made - no message left Markus Daft, RN, Sherre Poot PLEASE NOTE: All timestamps contained within this report are represented as [place] Standard Time. CONFIDENTIALTY NOTICE: This fax transmission is intended only for the addressee. It contains information that is legally privileged, confidential or otherwise protected from use or disclosure. If you are not the intended recipient, you are strictly prohibited from reviewing, disclosing, copying using or disseminating any of this information or taking any action in reliance on or regarding this information. If you have received this fax in error, please notify us immediately by telephone so that we can arrange for its return to [place]. Phone: [phone], Toll-Free: [phone], Fax: [phone] Page: 2 of 2 Call Id: [id number]  Disp. Time Eilene Ghazi Time) Disposition Final  User 10/11/2014 9:31:09 AM Attempt made - message left Markus Daft RN, Sherre Poot 10/11/2014 9:34:21 AM Attempt made - message left Jackqulyn Livings 10/11/2014 10:03:17 AM Called On-Call Provider Hamilton, RN, Sherre Poot 10/11/2014 9:51:42 AM Call PCP Now Yes Markus Daft, RN, Kenton Kingfisher Understands: Yes Disagree/Comply: Comply Care Advice Given Per Guideline CALL PCP NOW: You need to discuss this with your doctor. I'll page him now. If you haven't heard from the on-call doctor within 30 minutes, call again. CALL BACK IF: * Vomiting occurs * Rapid breathing occurs * You become worse. CARE ADVICE given per Diabetes - High Blood Sugar (Adult) guideline. After Care Instructions Given Call Event Type User Date / Time Description Comments User: Mayford Knife, RN Date/Time Eilene Ghazi Time): 10/11/2014 9:32:40 AM RN notified caller that unable to reach her mother. Caller states that she did reach her mother, and told her to pick up. She will go to her mother's now. RN will call back in 30 min. or so. Caller verb. understanding. User: Mayford Knife, RN Date/Time Eilene Ghazi Time): 10/11/2014 9:52:50 AM Pt returned call to nurse now. She is alert and oriented x 3. Has not taken her Glipizide this AM yet or had anything to eat/drink. RN asked that she recheck her blood sugar now. -- Pt reports that blood sugar is up to 431. She states that she would like to go to sleep. She has not been drinking much water lately. Last urinated this AM. User: Mayford Knife, RN Date/Time Eilene Ghazi Time): 10/11/2014 9:58:20 AM RN advised pt to drink 8 oz of water while I reach the doctor. Caller verb. understanding. Referrals REFERRED TO PCP OFFICE [facility] - ED Paging DoctorName DoctorPhone DateTime Result/Outcome Notes Dr. Eliezer Lofts [phone] 10/11/2014 10:03:16 AM Called On Call Provider - Reached report given to [facility], office triage nurse Dr. Eliezer Lofts 10/11/2014 10:15:28 AM Spoke with On Call - General Per Mearl Latin, states that MD  is very concerned, and with her blood sugar cont. to rise despite to eat, and past recent vomiting episode, she wants pt to go on to ER immediately. - Caller notified, and she will go to ER now. Her dtr will drive her, and is there with her now.

## 2014-10-11 NOTE — ED Notes (Signed)
Pt has had n/v since Sunday.  Pt noticed yesterday that cbg elevated.  Was able to keep meds down last night and today.  No pain.  No fever.  No vomiting today.  cbg 400's

## 2014-10-11 NOTE — Telephone Encounter (Signed)
[facility] Patient Name: [name] DOB: 1933/08/30 Nurse Assessment Nurse: Markus Daft, RN, Sherre Poot Date/Time (Eastern Time): 10/11/2014 9:01:17 AM Confirm and document reason for call. If symptomatic, describe symptoms. ---Caller states her mother has not been taking her meds regularly, is a diabetic. She's been sick with vomiting all day x 2 days (Sunday and Monday) about > 5 episodes each day, then improved on Tuesday. Noticed yesterday blood sugar was 400. And resumed medication yesterday. This am her fasting blood sugar is 302 at 8:30 am, is loopy and tired. Has the patient traveled out of the country within the last 30 days? ---Not Applicable Does the patient require triage? ---Yes Related visit to physician within the last 2 weeks? ---No Does the PT have any chronic conditions? (i.e. diabetes, asthma, etc.) ---Yes List chronic conditions. ---NIDDM - Glipizide ER 10 mg QD, HTN, Iron deficiency anemia Guidelines Guideline Title Affirmed Question Affirmed Notes Diabetes - High Blood Sugar Blood glucose > 400 mg/dl (22 mmol/l) Final Disposition User Call PCP Now Markus Daft, RN, Sherre Poot.  MD notified and wants pt to go to ER.  Pt notified, and will go to ER now. Comments RN speaking with pt now and she is alert and oriented x 3. Has not taken her Glipizide this AM yet or had anything to eat/drink. RN asked that she recheck her blood sugar now. -- Pt reports that blood sugar is up to 431. She states that she would like to go to sleep. She has not been drinking much water lately. Last urinated this AM. RN advised pt to drink 8 oz of water while I reach the doctor. Caller verb. underestanding.

## 2014-10-17 ENCOUNTER — Telehealth: Payer: Self-pay | Admitting: *Deleted

## 2014-10-17 MED ORDER — LANCETS MICRO THIN 33G MISC: Status: DC

## 2014-10-17 NOTE — Telephone Encounter (Signed)
Pt left voicemail at Triage. Pt is requesting a refill of "micro lancets" pt uses [facility] [address] pt said Rx has expired, pt request call back once done

## 2014-10-17 NOTE — Telephone Encounter (Signed)
Ms. Alf notified prescription for lancets have been sent to her pharmacy.

## 2014-10-22 ENCOUNTER — Ambulatory Visit (INDEPENDENT_AMBULATORY_CARE_PROVIDER_SITE_OTHER): Payer: Medicare Other | Admitting: Family Medicine

## 2014-10-22 ENCOUNTER — Encounter: Payer: Self-pay | Admitting: Family Medicine

## 2014-10-22 VITALS — BP 110/60 | HR 65 | Temp 97.4°F | Ht 62.5 in | Wt 173.0 lb

## 2014-10-22 DIAGNOSIS — Z79899 Other long term (current) drug therapy: Secondary | ICD-10-CM

## 2014-10-22 DIAGNOSIS — E1365 Other specified diabetes mellitus with hyperglycemia: Secondary | ICD-10-CM

## 2014-10-22 DIAGNOSIS — E1349 Other specified diabetes mellitus with other diabetic neurological complication: Secondary | ICD-10-CM

## 2014-10-22 DIAGNOSIS — E038 Other specified hypothyroidism: Secondary | ICD-10-CM | POA: Diagnosis not present

## 2014-10-22 DIAGNOSIS — R5382 Chronic fatigue, unspecified: Secondary | ICD-10-CM | POA: Diagnosis not present

## 2014-10-22 DIAGNOSIS — R8290 Unspecified abnormal findings in urine: Secondary | ICD-10-CM

## 2014-10-22 DIAGNOSIS — E559 Vitamin D deficiency, unspecified: Secondary | ICD-10-CM

## 2014-10-22 DIAGNOSIS — F320 Major depressive disorder, single episode, mild: Secondary | ICD-10-CM

## 2014-10-22 DIAGNOSIS — R5383 Other fatigue: Secondary | ICD-10-CM | POA: Insufficient documentation

## 2014-10-22 DIAGNOSIS — G63 Polyneuropathy in diseases classified elsewhere: Secondary | ICD-10-CM | POA: Diagnosis not present

## 2014-10-22 DIAGNOSIS — IMO0002 Reserved for concepts with insufficient information to code with codable children: Secondary | ICD-10-CM

## 2014-10-22 LAB — CBC WITH DIFFERENTIAL/PLATELET
Basophils Absolute: 0.1 10*3/uL (ref 0.0–0.1)
Basophils Relative: 1 % (ref 0.0–3.0)
EOS ABS: 0.3 10*3/uL (ref 0.0–0.7)
Eosinophils Relative: 4.6 % (ref 0.0–5.0)
HCT: 34.9 % — ABNORMAL LOW (ref 36.0–46.0)
HEMOGLOBIN: 12.1 g/dL (ref 12.0–15.0)
Lymphocytes Relative: 21.5 % (ref 12.0–46.0)
Lymphs Abs: 1.6 10*3/uL (ref 0.7–4.0)
MCHC: 34.6 g/dL (ref 30.0–36.0)
MCV: 90.7 fl (ref 78.0–100.0)
MONO ABS: 0.3 10*3/uL (ref 0.1–1.0)
MONOS PCT: 4.6 % (ref 3.0–12.0)
NEUTROS ABS: 5 10*3/uL (ref 1.4–7.7)
Neutrophils Relative %: 68.3 % (ref 43.0–77.0)
Platelets: 177 10*3/uL (ref 150.0–400.0)
RBC: 3.85 Mil/uL — AB (ref 3.87–5.11)
RDW: 14.8 % (ref 11.5–15.5)
WBC: 7.3 10*3/uL (ref 4.0–10.5)

## 2014-10-22 LAB — POCT URINALYSIS DIPSTICK
Bilirubin, UA: NEGATIVE
GLUCOSE UA: NEGATIVE
KETONES UA: NEGATIVE
NITRITE UA: NEGATIVE
SPEC GRAV UA: 1.025
UROBILINOGEN UA: 0.2
pH, UA: 5.5

## 2014-10-22 LAB — HEMOGLOBIN A1C: Hgb A1c MFr Bld: 10.4 % — ABNORMAL HIGH (ref 4.6–6.5)

## 2014-10-22 LAB — VITAMIN D 25 HYDROXY (VIT D DEFICIENCY, FRACTURES): VITD: 23.31 ng/mL — AB (ref 30.00–100.00)

## 2014-10-22 LAB — TSH: TSH: 1.13 u[IU]/mL (ref 0.35–4.50)

## 2014-10-22 LAB — VITAMIN B12: Vitamin B-12: 576 pg/mL (ref 211–911)

## 2014-10-22 NOTE — Assessment & Plan Note (Signed)
Re-eval as low vit D may be contributing to fatigue.

## 2014-10-22 NOTE — Assessment & Plan Note (Signed)
Most likely secondary to depression. Will eval for anemia, thyroid, vit def etc. Also will recheck UA/cultute given in past nonspecific symptoms with UTI.

## 2014-10-22 NOTE — Assessment & Plan Note (Signed)
Moderate control. Will have pt send in formulary to see if more affordable option compared to [place]. IF no other options.. Discuss actos as an option with Dr. Mare Ferrari pt's cardiologist.

## 2014-10-22 NOTE — Assessment & Plan Note (Signed)
If lab eval negative, consider treatment with sertraline 25 mg daily,

## 2014-10-22 NOTE — Assessment & Plan Note (Signed)
Re-ebval given worsened fatigue

## 2014-10-22 NOTE — Progress Notes (Signed)
Subjective:    Patient ID: [name], female    DOB: [birth year], 81 y.o.   MRN: [id number]  HPI  81 year old female with DM and CADpresents for hospital follow up to [facility] for severe hyperglycemia. She was seen in ER  On 1/15 after several episodes of N/V. She was unable to keep med down.  The vomiting resolved and she restarted her med but had CBGs at 400. She also had cough , viral illness.   Labs showed CGX 307, nml cr, abn UA.  EKG  And CXR clear.  She had been on glipizide 10 mg daily alone.  In ER she was started on januvia 100 mg daily. She was treated for UTI, given ceftriaxone, completed course of keflex.  Today she comes with her niece and reports   Resolution of cough now, no fever. No dysuria, no urgency , no frequency.  FBS 150, not checking post prandial.  no lows < 60.   Lab Results  Component Value Date   HGBA1C 7.6* 01/09/2014    BP Readings from Last 3 Encounters:  10/22/14 110/60  10/11/14 150/52  06/26/14 140/68   She feels she may be depressed.  Ongoing in last 1-1.5, worse in last few months. She has had to move into 1 bedroom apartment. Problems with son. No motivation, overwhelmed. She feels excessively sleepy. Sleeps at night. She is fatigued.  She has anhedonia.  Tearful at times. Isolating her self from family some more.  No anxiety.        Review of Systems  Constitutional: Negative for fever and fatigue.  HENT: Negative for ear pain.   Eyes: Negative for pain.  Respiratory: Negative for chest tightness and shortness of breath.   Cardiovascular: Negative for chest pain, palpitations and leg swelling.  Gastrointestinal: Negative for abdominal pain.  Genitourinary: Negative for dysuria.       Objective:   Physical Exam  Constitutional: Vital signs are normal. She appears well-developed and well-nourished. She is cooperative.  Non-toxic appearance. She does not appear ill. No distress.  HENT:  Head: Normocephalic.    Right Ear: Hearing, tympanic membrane, external ear and ear canal normal. Tympanic membrane is not erythematous, not retracted and not bulging.  Left Ear: Hearing, tympanic membrane, external ear and ear canal normal. Tympanic membrane is not erythematous, not retracted and not bulging.  Nose: No mucosal edema or rhinorrhea. Right sinus exhibits no maxillary sinus tenderness and no frontal sinus tenderness. Left sinus exhibits no maxillary sinus tenderness and no frontal sinus tenderness.  Mouth/Throat: Uvula is midline, oropharynx is clear and moist and mucous membranes are normal.  Eyes: Conjunctivae, EOM and lids are normal. Pupils are equal, round, and reactive to light. Lids are everted and swept, no foreign bodies found.  Neck: Trachea normal and normal range of motion. Neck supple. Carotid bruit is not present. No thyroid mass and no thyromegaly present.  Cardiovascular: Normal rate, regular rhythm, S1 normal, S2 normal, normal heart sounds, intact distal pulses and normal pulses.  Exam reveals no gallop and no friction rub.   No murmur heard. Pulmonary/Chest: Effort normal and breath sounds normal. No tachypnea. No respiratory distress. She has no decreased breath sounds. She has no wheezes. She has no rhonchi. She has no rales.  Abdominal: Soft. Normal appearance and bowel sounds are normal. There is no tenderness.  Neurological: She is alert.  Skin: Skin is warm, dry and intact. No rash noted.  Psychiatric: Her speech is normal.  Judgment and thought content normal. Her mood appears not anxious. Her affect is blunt. She is withdrawn. Cognition and memory are normal. She does not exhibit a depressed mood.          Assessment & Plan:

## 2014-10-22 NOTE — Progress Notes (Signed)
Pre visit review using our clinic review tool, if applicable. No additional management support is needed unless otherwise documented below in the visit note. 

## 2014-10-22 NOTE — Patient Instructions (Addendum)
Send DM med list through MyChart for [place] to consider another option.  Follow blood sugars at home . Goal Fasting blood sugar < 120, goal 2 hours after meals is < 180. Work on low sugar diet.  Increase exercise.  Stop at lab on way out.  We will plan sertraline low dose if lab normal.

## 2014-10-23 LAB — COMPREHENSIVE METABOLIC PANEL
ALK PHOS: 76 U/L (ref 39–117)
ALT: 13 U/L (ref 0–35)
AST: 14 U/L (ref 0–37)
Albumin: 3.6 g/dL (ref 3.5–5.2)
BUN: 42 mg/dL — ABNORMAL HIGH (ref 6–23)
CHLORIDE: 104 meq/L (ref 96–112)
CO2: 21 mEq/L (ref 19–32)
CREATININE: 1.59 mg/dL — AB (ref 0.40–1.20)
Calcium: 9.5 mg/dL (ref 8.4–10.5)
GFR: 33.25 mL/min — ABNORMAL LOW (ref >60.00)
GLUCOSE: 322 mg/dL — AB (ref 70–99)
Potassium: 4.9 mEq/L (ref 3.5–5.1)
Sodium: 133 mEq/L — ABNORMAL LOW (ref 135–145)
Total Bilirubin: 0.4 mg/dL (ref 0.2–1.2)
Total Protein: 7 g/dL (ref 6.0–8.3)

## 2014-10-25 LAB — URINE CULTURE: Colony Count: 70000

## 2014-10-25 MED ORDER — SERTRALINE HCL 25 MG PO TABS: 25.0000 mg | ORAL_TABLET | Freq: Every day | ORAL | Status: DC

## 2014-10-25 MED ORDER — VITAMIN D (ERGOCALCIFEROL) 1.25 MG (50000 UNIT) PO CAPS: 50000.0000 [IU] | ORAL_CAPSULE | ORAL | Status: DC

## 2014-10-25 NOTE — Addendum Note (Signed)
Addended by: Carter Kitten on: 10/25/2014 03:40 PM   Modules accepted: Orders

## 2014-10-25 NOTE — Addendum Note (Signed)
Addended by: Carter Kitten on: 10/25/2014 02:38 PM   Modules accepted: Orders

## 2014-10-28 MED ORDER — PIOGLITAZONE HCL 15 MG PO TABS: 15.0000 mg | ORAL_TABLET | Freq: Every day | ORAL | Status: DC

## 2014-10-28 NOTE — Telephone Encounter (Signed)
-----   Message from Carter Kitten, [place] sent at 10/27/2014  6:35 PM EST -----   ----- Message -----    From: Darlin Coco, MD    Sent: 10/26/2014   1:46 PM      To: Carter Kitten, CMA  Okay to try Actos. EF by lexiscan in 2013 was 72%. Vaughan Browner MD ----- Message -----    From: Carter Kitten, CMA    Sent: 10/25/2014   3:35 PM      To: Darlin Coco, MD  Dr. Mare Ferrari if you feel Ms. Peyser has any contraindication to generic actos with her heart history ( I see no documented CHF, but no recent ECHO) ?   Thanks, Dr. Neomia Glass

## 2014-10-28 NOTE — Telephone Encounter (Signed)
Let pt and daughter know we will start actos 15 mg daily in addition to her other meds for blood sugar control.. May need to increase dose further if CBGs not under control.   Also check to see how pt is tolerating new med for mood, sertraline. Thanks.

## 2014-10-28 NOTE — Telephone Encounter (Signed)
Ms. Saddler and Sondra Barges notified as instructed by telephone.  Ms. Rasberry has not started the sertraline yet.

## 2014-10-31 ENCOUNTER — Other Ambulatory Visit: Payer: Self-pay | Admitting: Family Medicine

## 2014-10-31 NOTE — Telephone Encounter (Signed)
Last office visit 10/22/2014.  Ok to refill?

## 2014-11-01 NOTE — Telephone Encounter (Signed)
Called to [facility] [address] 

## 2014-11-06 ENCOUNTER — Other Ambulatory Visit: Payer: Self-pay | Admitting: *Deleted

## 2014-11-06 MED ORDER — GLIPIZIDE ER 10 MG PO TB24: ORAL_TABLET | ORAL | Status: DC

## 2014-11-07 ENCOUNTER — Other Ambulatory Visit: Payer: Self-pay | Admitting: Family Medicine

## 2014-11-07 NOTE — Telephone Encounter (Signed)
Last office visit 10/22/2014.  Last refilled 04/15/2014 for #90 with 3 refills.  Ok to refill?

## 2014-12-24 ENCOUNTER — Other Ambulatory Visit: Payer: Self-pay | Admitting: Family Medicine

## 2015-01-14 ENCOUNTER — Telehealth: Payer: Self-pay | Admitting: Family Medicine

## 2015-01-14 DIAGNOSIS — O480 Post-term pregnancy: Secondary | ICD-10-CM

## 2015-01-14 NOTE — Telephone Encounter (Signed)
-----   Message from Ellamae Sia sent at 01/13/2015 12:19 PM EDT ----- Regarding: Lab orders for Thursday, 4.21.16 Lab orders for a 3 month f/u

## 2015-01-16 ENCOUNTER — Other Ambulatory Visit (INDEPENDENT_AMBULATORY_CARE_PROVIDER_SITE_OTHER): Payer: Medicare Other

## 2015-01-16 DIAGNOSIS — E119 Type 2 diabetes mellitus without complications: Secondary | ICD-10-CM | POA: Diagnosis not present

## 2015-01-16 DIAGNOSIS — O480 Post-term pregnancy: Secondary | ICD-10-CM

## 2015-01-16 LAB — LIPID PANEL
CHOL/HDL RATIO: 6
CHOLESTEROL: 215 mg/dL — AB (ref 0–200)
HDL: 36.2 mg/dL — AB (ref >39.00)
NonHDL: 178.8
TRIGLYCERIDES: 352 mg/dL — AB (ref 0.0–149.0)
VLDL: 70.4 mg/dL — ABNORMAL HIGH (ref 0.0–40.0)

## 2015-01-16 LAB — COMPREHENSIVE METABOLIC PANEL
ALBUMIN: 3.4 g/dL — AB (ref 3.5–5.2)
ALT: 10 U/L (ref 0–35)
AST: 12 U/L (ref 0–37)
Alkaline Phosphatase: 73 U/L (ref 39–117)
BUN: 40 mg/dL — AB (ref 6–23)
CHLORIDE: 107 meq/L (ref 96–112)
CO2: 22 meq/L (ref 19–32)
CREATININE: 1.99 mg/dL — AB (ref 0.40–1.20)
Calcium: 9.1 mg/dL (ref 8.4–10.5)
GFR: 25.65 mL/min — ABNORMAL LOW (ref >60.00)
Glucose, Bld: 133 mg/dL — ABNORMAL HIGH (ref 70–99)
Potassium: 4.5 mEq/L (ref 3.5–5.1)
Sodium: 136 mEq/L (ref 135–145)
Total Bilirubin: 0.4 mg/dL (ref 0.2–1.2)
Total Protein: 6.4 g/dL (ref 6.0–8.3)

## 2015-01-16 LAB — LDL CHOLESTEROL, DIRECT: LDL DIRECT: 109 mg/dL

## 2015-01-16 LAB — HEMOGLOBIN A1C: Hgb A1c MFr Bld: 7.9 % — ABNORMAL HIGH (ref 4.6–6.5)

## 2015-01-21 ENCOUNTER — Ambulatory Visit (INDEPENDENT_AMBULATORY_CARE_PROVIDER_SITE_OTHER): Payer: Medicare Other | Admitting: Family Medicine

## 2015-01-21 ENCOUNTER — Encounter: Payer: Self-pay | Admitting: Family Medicine

## 2015-01-21 VITALS — BP 150/72 | HR 70 | Temp 98.5°F | Ht 62.5 in | Wt 185.5 lb

## 2015-01-21 DIAGNOSIS — N289 Disorder of kidney and ureter, unspecified: Secondary | ICD-10-CM

## 2015-01-21 DIAGNOSIS — I10 Essential (primary) hypertension: Secondary | ICD-10-CM | POA: Diagnosis not present

## 2015-01-21 DIAGNOSIS — E1140 Type 2 diabetes mellitus with diabetic neuropathy, unspecified: Secondary | ICD-10-CM | POA: Diagnosis not present

## 2015-01-21 DIAGNOSIS — E7800 Pure hypercholesterolemia, unspecified: Secondary | ICD-10-CM

## 2015-01-21 LAB — HM DIABETES FOOT EXAM

## 2015-01-21 MED ORDER — PIOGLITAZONE HCL 30 MG PO TABS: 15.0000 mg | ORAL_TABLET | Freq: Every day | ORAL | Status: DC

## 2015-01-21 NOTE — Assessment & Plan Note (Signed)
INadequate control on every other day atorvastatin.[name] to daily. Re-eval in 3 months.

## 2015-01-21 NOTE — Progress Notes (Signed)
Pre visit review using our clinic review tool, if applicable. No additional management support is needed unless otherwise documented below in the visit note. 

## 2015-01-21 NOTE — Progress Notes (Signed)
Subjective:    Patient ID: [name], female    DOB: August 24, 1933, 81 y.o.   MRN: [id number]  HPI 81 year old female with DM and CAD presents for  3 month follow up.  Diabetes:   She was taken off [place] due to cost. She has been on actos low dose for 3 months now as wel as gliupizide. Lab Results  Component Value Date   HGBA1C 7.9* 01/16/2015  Using medications without difficulties: Hypoglycemic episodes: Hyperglycemic episodes: Feet problems: Blood Sugars averaging: FBS: 100-110, 2 hour post prandial. eye exam within last year:  Hypertension:    Elevated today on losartan, metoprolol. ?white coat HTN. BP Readings from Last 3 Encounters:  01/21/15 150/72  10/22/14 110/60  10/11/14 150/52   Using medication without problems or lightheadedness: None Chest pain with exertion:None Edema:mild Short of breath: OCC, stable Average home BPs: At drugstore Other issues:  Elevated Cholesterol:  LDL almost at goal on atorvastatin 40 mg daily every other day, trig high.  Goal < 70 given CAD. Lab Results  Component Value Date   CHOL 215* 01/16/2015   HDL 36.20* 01/16/2015   LDLCALC 94 01/09/2014   LDLDIRECT 109.0 01/16/2015   TRIG 352.0* 01/16/2015   CHOLHDL 6 01/16/2015  Using medications without problems: None Muscle aches: None Diet compliance: poor Exercise: Walking some Other complaints: Wt Readings from Last 3 Encounters:  01/21/15 185 lb 8 oz (84.142 kg)  10/22/14 173 lb (78.472 kg)  06/26/14 183 lb (83.008 kg)     CKD: Slightly increased creatinine. Baseline 1.59, now 1.99   Review of Systems  Constitutional: Negative for fever and fatigue.  HENT: Negative for ear pain.   Eyes: Negative for pain.  Respiratory: Negative for chest tightness.   Cardiovascular: Negative for chest pain and palpitations.  Gastrointestinal: Negative for abdominal pain.  Genitourinary: Negative for dysuria.       Objective:   Physical Exam  Constitutional: Vital signs  are normal. She appears well-developed and well-nourished. She is cooperative.  Non-toxic appearance. She does not appear ill. No distress.  kyphosis  HENT:  Head: Normocephalic.  Right Ear: Hearing, tympanic membrane, external ear and ear canal normal. Tympanic membrane is not erythematous, not retracted and not bulging.  Left Ear: Hearing, tympanic membrane, external ear and ear canal normal. Tympanic membrane is not erythematous, not retracted and not bulging.  Nose: No mucosal edema or rhinorrhea. Right sinus exhibits no maxillary sinus tenderness and no frontal sinus tenderness. Left sinus exhibits no maxillary sinus tenderness and no frontal sinus tenderness.  Mouth/Throat: Uvula is midline, oropharynx is clear and moist and mucous membranes are normal.  Eyes: Conjunctivae, EOM and lids are normal. Pupils are equal, round, and reactive to light. Lids are everted and swept, no foreign bodies found.  Neck: Trachea normal and normal range of motion. Neck supple. Carotid bruit is not present. No thyroid mass and no thyromegaly present.  Cardiovascular: Normal rate, regular rhythm, S1 normal, S2 normal, normal heart sounds, intact distal pulses and normal pulses.  Exam reveals no gallop and no friction rub.   No murmur heard. Pulmonary/Chest: Effort normal and breath sounds normal. No tachypnea. No respiratory distress. She has no decreased breath sounds. She has no wheezes. She has no rhonchi. She has no rales.  Abdominal: Soft. Normal appearance and bowel sounds are normal. There is no tenderness.  Neurological: She is alert.  Skin: Skin is warm, dry and intact. No rash noted.  Psychiatric: Her speech is  normal and behavior is normal. Judgment and thought content normal. Her mood appears not anxious. Cognition and memory are normal. She does not exhibit a depressed mood.   Diabetic foot exam: Normal inspection except hammer toes. No skin breakdown No calluses  Normal DP pulses decreased  sensation to light touch and monofilament in toes, nml in foot Nails normal       Assessment & Plan:

## 2015-01-21 NOTE — Patient Instructions (Addendum)
Work on increasing exercsie and decreasing fat and sugar in diet.  Increase actos to 30 mg daily.  Consider getting blood pressure cuff at home. Follow blood pressure at home, Call if > 140/90 x 3 times.  Increase atorvastatin to 40 mg daily instead of every other day.  Can try coQ10 daily to help with any muscle side effect.  Increase water intake to help you kidneys. Schedule lab only visit in 2 weeks for BMET to check kidneys.  Don't forget yearly eye exam.

## 2015-01-21 NOTE — Assessment & Plan Note (Signed)
Increase to acots 30 mg daily Encouraged exercise, weight loss, healthy eating habits.

## 2015-01-21 NOTE — Assessment & Plan Note (Signed)
INcrease fluids. Recehck in 2 weeks.  No other clear source. Pt is on ARB continue for now.

## 2015-01-21 NOTE — Assessment & Plan Note (Signed)
Borderline control. Follow at home Consider cahnge/increase of meds if poorly controlled at home as well.

## 2015-01-26 ENCOUNTER — Other Ambulatory Visit: Payer: Self-pay | Admitting: Family Medicine

## 2015-02-07 ENCOUNTER — Other Ambulatory Visit: Payer: Self-pay | Admitting: Family Medicine

## 2015-02-07 DIAGNOSIS — N289 Disorder of kidney and ureter, unspecified: Secondary | ICD-10-CM

## 2015-02-08 ENCOUNTER — Other Ambulatory Visit: Payer: Self-pay | Admitting: Family Medicine

## 2015-02-08 NOTE — Telephone Encounter (Signed)
Last office visit 01/21/2015.  Refill?

## 2015-02-11 ENCOUNTER — Other Ambulatory Visit: Payer: Self-pay

## 2015-02-17 DIAGNOSIS — I739 Peripheral vascular disease, unspecified: Secondary | ICD-10-CM | POA: Diagnosis not present

## 2015-02-17 DIAGNOSIS — M2011 Hallux valgus (acquired), right foot: Secondary | ICD-10-CM | POA: Diagnosis not present

## 2015-02-17 DIAGNOSIS — M2012 Hallux valgus (acquired), left foot: Secondary | ICD-10-CM | POA: Diagnosis not present

## 2015-02-17 DIAGNOSIS — E1151 Type 2 diabetes mellitus with diabetic peripheral angiopathy without gangrene: Secondary | ICD-10-CM | POA: Diagnosis not present

## 2015-02-17 DIAGNOSIS — L603 Nail dystrophy: Secondary | ICD-10-CM | POA: Diagnosis not present

## 2015-02-25 ENCOUNTER — Other Ambulatory Visit (INDEPENDENT_AMBULATORY_CARE_PROVIDER_SITE_OTHER): Payer: Medicare Other

## 2015-02-25 DIAGNOSIS — N289 Disorder of kidney and ureter, unspecified: Secondary | ICD-10-CM | POA: Diagnosis not present

## 2015-02-25 LAB — BASIC METABOLIC PANEL
BUN: 34 mg/dL — ABNORMAL HIGH (ref 6–23)
CO2: 24 mEq/L (ref 19–32)
Calcium: 9.1 mg/dL (ref 8.4–10.5)
Chloride: 103 mEq/L (ref 96–112)
Creatinine, Ser: 1.27 mg/dL — ABNORMAL HIGH (ref 0.40–1.20)
GFR: 43.05 mL/min — AB (ref >60.00)
GLUCOSE: 199 mg/dL — AB (ref 70–99)
Potassium: 4 mEq/L (ref 3.5–5.1)
Sodium: 135 mEq/L (ref 135–145)

## 2015-03-01 ENCOUNTER — Other Ambulatory Visit: Payer: Self-pay | Admitting: Family Medicine

## 2015-03-07 ENCOUNTER — Other Ambulatory Visit: Payer: Self-pay | Admitting: Family Medicine

## 2015-03-24 DIAGNOSIS — R300 Dysuria: Secondary | ICD-10-CM | POA: Diagnosis not present

## 2015-03-24 DIAGNOSIS — N390 Urinary tract infection, site not specified: Secondary | ICD-10-CM | POA: Diagnosis not present

## 2015-04-02 ENCOUNTER — Other Ambulatory Visit: Payer: Self-pay | Admitting: Family Medicine

## 2015-04-08 ENCOUNTER — Other Ambulatory Visit: Payer: Self-pay | Admitting: Family Medicine

## 2015-04-28 DIAGNOSIS — I739 Peripheral vascular disease, unspecified: Secondary | ICD-10-CM | POA: Diagnosis not present

## 2015-04-28 DIAGNOSIS — E1151 Type 2 diabetes mellitus with diabetic peripheral angiopathy without gangrene: Secondary | ICD-10-CM | POA: Diagnosis not present

## 2015-04-28 DIAGNOSIS — L603 Nail dystrophy: Secondary | ICD-10-CM | POA: Diagnosis not present

## 2015-05-04 ENCOUNTER — Other Ambulatory Visit: Payer: Self-pay | Admitting: Family Medicine

## 2015-05-27 ENCOUNTER — Ambulatory Visit: Payer: Self-pay | Admitting: Family Medicine

## 2015-06-04 ENCOUNTER — Other Ambulatory Visit: Payer: Self-pay | Admitting: Family Medicine

## 2015-06-04 NOTE — Telephone Encounter (Signed)
Last office visit 01/21/2015.  Last refilled 02/09/2015 for #4 with 2 refills.  Last Vit D level 10/22/2014.  Ok to refill?

## 2015-06-06 ENCOUNTER — Other Ambulatory Visit: Payer: Self-pay | Admitting: Family Medicine

## 2015-06-17 ENCOUNTER — Ambulatory Visit: Payer: Self-pay | Admitting: Family Medicine

## 2015-06-28 ENCOUNTER — Other Ambulatory Visit: Payer: Self-pay | Admitting: Family Medicine

## 2015-07-03 ENCOUNTER — Ambulatory Visit: Payer: Self-pay | Admitting: Family Medicine

## 2015-07-03 ENCOUNTER — Other Ambulatory Visit: Payer: Self-pay | Admitting: Cardiology

## 2015-07-17 DIAGNOSIS — L603 Nail dystrophy: Secondary | ICD-10-CM | POA: Diagnosis not present

## 2015-07-17 DIAGNOSIS — I739 Peripheral vascular disease, unspecified: Secondary | ICD-10-CM | POA: Diagnosis not present

## 2015-07-17 DIAGNOSIS — E1151 Type 2 diabetes mellitus with diabetic peripheral angiopathy without gangrene: Secondary | ICD-10-CM | POA: Diagnosis not present

## 2015-07-25 ENCOUNTER — Telehealth: Payer: Self-pay | Admitting: Family Medicine

## 2015-07-25 ENCOUNTER — Encounter: Payer: Self-pay | Admitting: Family Medicine

## 2015-07-25 ENCOUNTER — Ambulatory Visit (INDEPENDENT_AMBULATORY_CARE_PROVIDER_SITE_OTHER): Payer: Medicare Other | Admitting: Family Medicine

## 2015-07-25 VITALS — BP 134/62 | Temp 98.2°F | Ht 62.5 in | Wt 192.0 lb

## 2015-07-25 DIAGNOSIS — I10 Essential (primary) hypertension: Secondary | ICD-10-CM

## 2015-07-25 DIAGNOSIS — E1140 Type 2 diabetes mellitus with diabetic neuropathy, unspecified: Secondary | ICD-10-CM

## 2015-07-25 DIAGNOSIS — E7800 Pure hypercholesterolemia, unspecified: Secondary | ICD-10-CM | POA: Diagnosis not present

## 2015-07-25 DIAGNOSIS — Z23 Encounter for immunization: Secondary | ICD-10-CM

## 2015-07-25 DIAGNOSIS — N289 Disorder of kidney and ureter, unspecified: Secondary | ICD-10-CM | POA: Diagnosis not present

## 2015-07-25 LAB — COMPREHENSIVE METABOLIC PANEL
ALBUMIN: 3.5 g/dL (ref 3.5–5.2)
ALK PHOS: 66 U/L (ref 39–117)
ALT: 12 U/L (ref 0–35)
AST: 13 U/L (ref 0–37)
BILIRUBIN TOTAL: 0.3 mg/dL (ref 0.2–1.2)
BUN: 47 mg/dL — ABNORMAL HIGH (ref 6–23)
CALCIUM: 9.2 mg/dL (ref 8.4–10.5)
CHLORIDE: 107 meq/L (ref 96–112)
CO2: 23 mEq/L (ref 19–32)
CREATININE: 1.32 mg/dL — AB (ref 0.40–1.20)
GFR: 41.13 mL/min — ABNORMAL LOW (ref >60.00)
Glucose, Bld: 189 mg/dL — ABNORMAL HIGH (ref 70–99)
Potassium: 4.6 mEq/L (ref 3.5–5.1)
Sodium: 139 mEq/L (ref 135–145)
TOTAL PROTEIN: 6.4 g/dL (ref 6.0–8.3)

## 2015-07-25 LAB — LIPID PANEL
CHOL/HDL RATIO: 4
CHOLESTEROL: 168 mg/dL (ref 0–200)
HDL: 39 mg/dL — ABNORMAL LOW (ref >39.00)
NONHDL: 129.14
TRIGLYCERIDES: 217 mg/dL — AB (ref 0.0–149.0)
VLDL: 43.4 mg/dL — ABNORMAL HIGH (ref 0.0–40.0)

## 2015-07-25 LAB — HM DIABETES FOOT EXAM

## 2015-07-25 LAB — LDL CHOLESTEROL, DIRECT: LDL DIRECT: 90 mg/dL

## 2015-07-25 LAB — HEMOGLOBIN A1C: HEMOGLOBIN A1C: 5.5 % (ref 4.6–6.5)

## 2015-07-25 NOTE — Progress Notes (Signed)
Pre visit review using our clinic review tool, if applicable. No additional management support is needed unless otherwise documented below in the visit note. 

## 2015-07-25 NOTE — Patient Instructions (Addendum)
Increase exercise.. Try to walk more.  Continue 30 mg actos daily.  Work on low carb and low chol diet. Stop at lab on way out. Try to increase atorvastatin to DAILY.

## 2015-07-25 NOTE — Assessment & Plan Note (Signed)
Due for re-eval. Still not taking atorvastatin daily.. Encouraged her to do so given no SE.

## 2015-07-25 NOTE — Telephone Encounter (Signed)
See result note for lab report

## 2015-07-25 NOTE — Assessment & Plan Note (Signed)
Due for re-eval. Appears improved control on 30 mg of actos with decreased CBGs. Reviewed Diabetic diet, decrease cookies!

## 2015-07-25 NOTE — Progress Notes (Signed)
81 year old female with DM and CAD presents for 6 month follow up.  Diabetes: She was taken off [place] due to cost. She has been on actos 30 mg dose for 6 months now as wel as glipizide. Due for re-eval. Lab Results  Component Value Date   HGBA1C 7.9* 01/16/2015  Using medications without difficulties: No SE on higher dose of actos. Hypoglycemic episodes: Hyperglycemic episodes: once this AM! Did eat 4 cookies at bedtime last night Feet problems: None Blood Sugars averaging: FBS: 95-110 eye exam within last year: due but has set up next week.  Hypertension:  Good control on losartan, metoprolol. ?white coat HTN. BP Readings from Last 3 Encounters:  07/25/15 134/62  01/21/15 150/72  10/22/14 110/60  Using medication without problems or lightheadedness: None Chest pain with exertion:None Edema:mild Short of breath: OCC, stable Average home BPs: At drugstore  Other issues:  Elevated Cholesterol: Due for re-eval on atorvastatin 40 mg still Every OTHER daily.   Goal < 70 given CAD. Lab Results  Component Value Date   CHOL 215* 01/16/2015   HDL 36.20* 01/16/2015   LDLCALC 94 01/09/2014   LDLDIRECT 109.0 01/16/2015   TRIG 352.0* 01/16/2015   CHOLHDL 6 01/16/2015  Using medications without problems: None Muscle aches: None Diet compliance: improving Exercise: none Other complaints: Wt Readings from Last 3 Encounters:  07/25/15 192 lb (87.091 kg)  01/21/15 185 lb 8 oz (84.142 kg)  10/22/14 173 lb (78.472 kg)    CKD: Slightly increased creatinine. Baseline 1.59, due for re-eval.   Review of Systems  Constitutional: Negative for fever and fatigue.  HENT: Negative for ear pain.  Eyes: Negative for pain.  Respiratory: Negative for chest tightness.  Cardiovascular: Negative for chest pain and palpitations.  Gastrointestinal: Negative for abdominal pain.  Genitourinary: Negative for dysuria.       Objective:   Physical Exam  Constitutional: Vital signs are  normal. She appears well-developed and well-nourished. She is cooperative. Non-toxic appearance. She does not appear ill. No distress.  kyphosis  HENT:  Head: Normocephalic.  Right Ear: Hearing, tympanic membrane, external ear and ear canal normal. Tympanic membrane is not erythematous, not retracted and not bulging.  Left Ear: Hearing, tympanic membrane, external ear and ear canal normal. Tympanic membrane is not erythematous, not retracted and not bulging.  Nose: No mucosal edema or rhinorrhea. Right sinus exhibits no maxillary sinus tenderness and no frontal sinus tenderness. Left sinus exhibits no maxillary sinus tenderness and no frontal sinus tenderness.  Mouth/Throat: Uvula is midline, oropharynx is clear and moist and mucous membranes are normal.  Eyes: Conjunctivae, EOM and lids are normal. Pupils are equal, round, and reactive to light. Lids are everted and swept, no foreign bodies found.  Neck: Trachea normal and normal range of motion. Neck supple. Carotid bruit is not present. No thyroid mass and no thyromegaly present.  Cardiovascular: Normal rate, regular rhythm, S1 normal, S2 normal, normal heart sounds, intact distal pulses and normal pulses. Exam reveals no gallop and no friction rub.  No murmur heard. Pulmonary/Chest: Effort normal and breath sounds normal. No tachypnea. No respiratory distress. She has no decreased breath sounds. She has no wheezes. She has no rhonchi. She has no rales.  Abdominal: Soft. Normal appearance and bowel sounds are normal. There is no tenderness.  Neurological: She is alert.  Skin: Skin is warm, dry and intact. No rash noted.  Psychiatric: Her speech is normal and behavior is normal. Judgment and thought content normal. Her mood  appears not anxious. Cognition and memory are normal. She does not exhibit a depressed mood.   Diabetic foot exam: Normal inspection except hammer toes. No skin breakdown No calluses  Normal DP pulses decreased  sensation to light touch and monofilament in toes, nml in foot Nails normal

## 2015-07-25 NOTE — Assessment & Plan Note (Signed)
Well controlled. Continue current medication.  

## 2015-07-25 NOTE — Assessment & Plan Note (Signed)
Due for re-eval. 

## 2015-07-28 ENCOUNTER — Telehealth: Payer: Self-pay | Admitting: *Deleted

## 2015-07-28 NOTE — Telephone Encounter (Signed)
-----   Message from Jinny Sanders, MD sent at 07/25/2015  5:11 PM EDT ----- Blood sugar control is exellent on higher dose of actos. Continue current meds for now, if any lightheadedness check blood sugar.. If < 60 we can decrease her glipizide some.  Please change/correct actos on her med list to reflect the 30 mg daily she is taking.  Cholesterol almost at goal.. Need to take simvastatin every day not every other day. Kidney function is not at baseline Cr 1.32.

## 2015-07-28 NOTE — Telephone Encounter (Signed)
Left message for [name] to return my call.

## 2015-07-29 NOTE — Telephone Encounter (Signed)
[name] notified as instructed by telephone.  Medication list updated.

## 2015-07-31 ENCOUNTER — Ambulatory Visit: Payer: Self-pay | Admitting: Cardiology

## 2015-08-08 ENCOUNTER — Encounter: Payer: Self-pay | Admitting: Cardiology

## 2015-08-11 ENCOUNTER — Encounter: Payer: Self-pay | Admitting: Cardiology

## 2015-08-27 ENCOUNTER — Ambulatory Visit: Payer: Medicare Other | Admitting: Cardiology

## 2015-08-28 ENCOUNTER — Encounter: Payer: Self-pay | Admitting: Cardiology

## 2015-08-28 ENCOUNTER — Ambulatory Visit (INDEPENDENT_AMBULATORY_CARE_PROVIDER_SITE_OTHER): Payer: Medicare Other | Admitting: Cardiology

## 2015-08-28 VITALS — BP 150/82 | HR 82 | Ht 62.5 in | Wt 193.1 lb

## 2015-08-28 DIAGNOSIS — I350 Nonrheumatic aortic (valve) stenosis: Secondary | ICD-10-CM | POA: Diagnosis not present

## 2015-08-28 DIAGNOSIS — I2510 Atherosclerotic heart disease of native coronary artery without angina pectoris: Secondary | ICD-10-CM | POA: Diagnosis not present

## 2015-08-28 DIAGNOSIS — I2583 Coronary atherosclerosis due to lipid rich plaque: Principal | ICD-10-CM

## 2015-08-28 DIAGNOSIS — R0600 Dyspnea, unspecified: Secondary | ICD-10-CM

## 2015-08-28 MED ORDER — ATORVASTATIN CALCIUM 40 MG PO TABS: 40.0000 mg | ORAL_TABLET | Freq: Every day | ORAL | Status: DC

## 2015-08-28 MED ORDER — NITROGLYCERIN 0.4 MG SL SUBL: 0.4000 mg | SUBLINGUAL_TABLET | SUBLINGUAL | Status: AC | PRN

## 2015-08-28 MED ORDER — METOPROLOL SUCCINATE ER 25 MG PO TB24: 25.0000 mg | ORAL_TABLET | Freq: Two times a day (BID) | ORAL | Status: DC

## 2015-08-28 MED ORDER — TRIAMTERENE-HCTZ 37.5-25 MG PO CAPS: 1.0000 | ORAL_CAPSULE | Freq: Every day | ORAL | Status: DC

## 2015-08-28 MED ORDER — LOSARTAN POTASSIUM 50 MG PO TABS: 50.0000 mg | ORAL_TABLET | Freq: Every day | ORAL | Status: DC

## 2015-08-28 NOTE — Patient Instructions (Signed)
Medication Instructions:  Your physician recommends that you continue on your current medications as directed. Please refer to the Current Medication list given to you today.   Labwork: NONE ORDERED  Testing/Procedures: Your physician has requested that you have an echocardiogram. Echocardiography is a painless test that uses sound waves to create images of your heart. It provides your doctor with information about the size and shape of your heart and how well your heart's chambers and valves are working. This procedure takes approximately one hour. There are no restrictions for this procedure.    Follow-Up: Your physician recommends that you schedule a follow-up appointment with DR. BRACKBILL BY THE END OF MARCH 2017.   Any Other Special Instructions Will Be Listed Below (If Applicable).  Echocardiogram An echocardiogram, or echocardiography, uses sound waves (ultrasound) to produce an image of your heart. The echocardiogram is simple, painless, obtained within a short period of time, and offers valuable information to your health care provider. The images from an echocardiogram can provide information such as:  Evidence of coronary artery disease (CAD).  Heart size.  Heart muscle function.  Heart valve function.  Aneurysm detection.  Evidence of a past heart attack.  Fluid buildup around the heart.  Heart muscle thickening.  Assess heart valve function. LET [facility] CARE PROVIDER KNOW ABOUT:  Any allergies you have.  All medicines you are taking, including vitamins, herbs, eye drops, creams, and over-the-counter medicines.  Previous problems you or members of your family have had with the use of anesthetics.  Any blood disorders you have.  Previous surgeries you have had.  Medical conditions you have.  Possibility of pregnancy, if this applies. BEFORE THE PROCEDURE  No special preparation is needed. Eat and drink normally.  PROCEDURE   In order to produce  an image of your heart, gel will be applied to your chest and a wand-like tool (transducer) will be moved over your chest. The gel will help transmit the sound waves from the transducer. The sound waves will harmlessly bounce off your heart to allow the heart images to be captured in real-time motion. These images will then be recorded.  You may need an IV to receive a medicine that improves the quality of the pictures. AFTER THE PROCEDURE You may return to your normal schedule including diet, activities, and medicines, unless your health care provider tells you otherwise.   This information is not intended to replace advice given to you by your health care provider. Make sure you discuss any questions you have with your health care provider.   Document Released: 09/10/2000 Document Revised: 10/04/2014 Document Reviewed: 05/21/2013 Elsevier Interactive Patient Education [employer].

## 2015-08-28 NOTE — Progress Notes (Signed)
08/28/2015 [name]   Oct 16, 1932  [id number]  Primary Physician Amy Diona Browner, MD Primary Cardiologist: Dr. Mare Ferrari   Reason for Visit/CC: Routien 1 year f/u for CAD  HPI:  The patient is a 81 year old female, followed by Dr. Mare Ferrari, who presents to clinic today for routine 1 year evaluation. She has a history of ischemic heart disease. She underwent coronary artery bypass graft surgery following an acute myocardial infarction and surgery was done on 12/22/06. She has not had any subsequent angina pectoris. She does have exertional dyspnea. She had an echocardiogram on 06/04/09 which showed an ejection fraction of 55-60% and showed impaired relaxation. She also has mild to moderate aortic stenosis and trace tricuspid regurgitation. She had a LexiScan Myoview stress test on 01/25/12 which showed a small inferior wall scar and no ischemia and her ejection fraction was 72%. The patient has a history of hypercholesterolemia and a history of diabetes mellitus with diabetic neuropathy. Her PCP is Dr. Diona Browner. She was last seen by Dr. Mare Ferrari 05/2014 and was felt to be stable from a cardiac standpoint. She was instructed to f/u in 1 year.  Today in clinic, she reports that she has done fairly well. She denies CP but notes mild DOE. She denies resting dyspnea. No syncope, near syncope.  No palpitations. She reports full medication compliance.     Current Outpatient Prescriptions  Medication Sig Dispense Refill  . aspirin 81 MG tablet Take 81 mg by mouth daily.      Marland Kitchen atorvastatin (LIPITOR) 40 MG tablet TAKE 1 TABLET BY MOUTH EVERY DAY 90 tablet 1  . CINNAMON PO Take 2,000 mg by mouth 2 (two) times daily.    . cyanocobalamin 1000 MCG tablet Take 100 mcg by mouth 2 (two) times daily.    . Ferrous Sulfate (IRON) 325 (65 FE) MG TABS Take 1 tablet by mouth 3 (three) times daily.     Marland Kitchen gabapentin (NEURONTIN) 100 MG capsule Take 100 mg by mouth 3 (three) times daily.    Marland Kitchen glipiZIDE (GLUCOTROL  XL) 10 MG 24 hr tablet TAKE 1 TABLET BY MOUTH EVERY DAY 30 tablet 5  . levothyroxine (SYNTHROID, LEVOTHROID) 50 MCG tablet TAKE 1 TABLET BY MOUTH EVERY DAY 30 tablet 10  . losartan (COZAAR) 50 MG tablet TAKE 1 TABLET BY MOUTH EVERY DAY 30 tablet 5  . metoprolol succinate (TOPROL-XL) 25 MG 24 hr tablet Take 25 mg by mouth 2 (two) times daily.    . nitroGLYCERIN (NITROSTAT) 0.4 MG SL tablet Place 0.4 mg under the tongue every 5 (five) minutes as needed for chest pain (x 3 tabs daily).     . pioglitazone (ACTOS) 30 MG tablet Take 30 mg by mouth daily.    . sertraline (ZOLOFT) 25 MG tablet TAKE 1 TABLET (25 MG TOTAL) BY MOUTH DAILY. 30 tablet 5  . triamterene-hydrochlorothiazide (DYAZIDE) 37.5-25 MG capsule TAKE ONE CAPSULE BY MOUTH EVERY DAY 90 capsule 0   No current facility-administered medications for this visit.    Allergies  Allergen Reactions  . Morphine And Related Other (See Comments)    BAD HEADACHE    Social History   Social History  . Marital Status: Divorced    Spouse Name: N/A  . Number of Children: N/A  . Years of Education: N/A   Occupational History  . Not on file.   Social History Main Topics  . Smoking status: Never Smoker   . Smokeless tobacco: Never Used  . Alcohol Use: No  .  Drug Use: No  . Sexual Activity: Not on file   Other Topics Concern  . Not on file   Social History Narrative     Review of Systems: General: negative for chills, fever, night sweats or weight changes.  Cardiovascular: negative for chest pain, dyspnea on exertion, edema, orthopnea, palpitations, paroxysmal nocturnal dyspnea or shortness of breath Dermatological: negative for rash Respiratory: negative for cough or wheezing Urologic: negative for hematuria Abdominal: negative for nausea, vomiting, diarrhea, bright red blood per rectum, melena, or hematemesis Neurologic: negative for visual changes, syncope, or dizziness All other systems reviewed and are otherwise negative except  as noted above.    Height 5' 2.5" (1.588 m).  General appearance: alert, cooperative and no distress Neck: no adenopathy, no carotid bruit and no JVD Lungs: clear to auscultation bilaterally Heart: regular rate and rhythm, S1, S2 normal, no murmur, click, rub or gallop Extremities: no LEE Pulses: 2+ and symmetric Skin: warm and dry Neurologic: Grossly normal  EKG NSR 1st degree AVB. 82 bpm.  ASSESSMENT AND PLAN:   1. Dyspnea with exertion: Lung fields are CTAB. No signs of volume overload. She denies CP. She has a h/o AS. Last assessment was 6 years ago. Will repeat 2D echo to reassess valve function, LVF and wall motion.   2. CAD: s/p CABG in 2008. Denies chest pain. EKG w/o ischemia. Continue medical therapy.  3. Aortic Stenosis: mild to moderate on Echo in 2010. Given her complaints of exertional dyspnea, will order a echocardiogram to reassess aortic valve and LVF.   PLAN  F/u with Dr. Mare Ferrari in 2-3 months.   Lyda Jester PA-C 08/28/2015 9:46 AM

## 2015-09-04 ENCOUNTER — Telehealth: Payer: Self-pay | Admitting: Cardiology

## 2015-09-04 NOTE — Telephone Encounter (Signed)
Patient having increased shortness of breath with exertion Patient denies any shortness of breath at rest, swelling, or pain She was seen a 12/1 by [place] PA and Echo ordered Patient originally had echo scheduled in January at her request but has had it moved up to 09/17/15, nothing available next week Patient is in a lot of pain when she is up moving around secondary to hip problems and that is when she tends to have her increased shortness of breath  Patient states shortness of breath has been gradual over the last couple of months but thinks may be worse since seeing [place] PA  Will forward to  Dr. Mare Ferrari for review

## 2015-09-04 NOTE — Telephone Encounter (Signed)
New Message  Pt c/o Shortness Of Breath: STAT if SOB developed within the last 24 hours or pt is noticeably SOB on the phone  1. Are you currently SOB (can you hear that pt is SOB on the phone)? No- pt sitting down  2. How long have you been experiencing SOB? Few days has been getting worse  3. Are you SOB when sitting or when up moving around? Up moving around   4. Are you currently experiencing any other symptoms? no

## 2015-09-05 ENCOUNTER — Emergency Department (HOSPITAL_COMMUNITY): Payer: Medicare Other

## 2015-09-05 ENCOUNTER — Encounter (HOSPITAL_COMMUNITY): Payer: Self-pay | Admitting: *Deleted

## 2015-09-05 ENCOUNTER — Telehealth: Payer: Self-pay | Admitting: Cardiology

## 2015-09-05 ENCOUNTER — Observation Stay (HOSPITAL_COMMUNITY)
Admission: EM | Admit: 2015-09-05 | Discharge: 2015-09-07 | Disposition: A | Discharge status: Home / Self Care | Payer: Medicare Other | Attending: Internal Medicine | Admitting: Internal Medicine

## 2015-09-05 DIAGNOSIS — E1122 Type 2 diabetes mellitus with diabetic chronic kidney disease: Secondary | ICD-10-CM | POA: Insufficient documentation

## 2015-09-05 DIAGNOSIS — I259 Chronic ischemic heart disease, unspecified: Secondary | ICD-10-CM | POA: Diagnosis not present

## 2015-09-05 DIAGNOSIS — Z951 Presence of aortocoronary bypass graft: Secondary | ICD-10-CM | POA: Insufficient documentation

## 2015-09-05 DIAGNOSIS — I2510 Atherosclerotic heart disease of native coronary artery without angina pectoris: Secondary | ICD-10-CM | POA: Diagnosis not present

## 2015-09-05 DIAGNOSIS — E785 Hyperlipidemia, unspecified: Secondary | ICD-10-CM | POA: Diagnosis not present

## 2015-09-05 DIAGNOSIS — I252 Old myocardial infarction: Secondary | ICD-10-CM | POA: Insufficient documentation

## 2015-09-05 DIAGNOSIS — Z7984 Long term (current) use of oral hypoglycemic drugs: Secondary | ICD-10-CM | POA: Diagnosis not present

## 2015-09-05 DIAGNOSIS — R0602 Shortness of breath: Secondary | ICD-10-CM | POA: Diagnosis not present

## 2015-09-05 DIAGNOSIS — I129 Hypertensive chronic kidney disease with stage 1 through stage 4 chronic kidney disease, or unspecified chronic kidney disease: Secondary | ICD-10-CM | POA: Diagnosis not present

## 2015-09-05 DIAGNOSIS — E039 Hypothyroidism, unspecified: Secondary | ICD-10-CM | POA: Diagnosis not present

## 2015-09-05 DIAGNOSIS — Z79899 Other long term (current) drug therapy: Secondary | ICD-10-CM | POA: Insufficient documentation

## 2015-09-05 DIAGNOSIS — J189 Pneumonia, unspecified organism: Principal | ICD-10-CM | POA: Diagnosis present

## 2015-09-05 DIAGNOSIS — Z7982 Long term (current) use of aspirin: Secondary | ICD-10-CM | POA: Insufficient documentation

## 2015-09-05 DIAGNOSIS — R0600 Dyspnea, unspecified: Secondary | ICD-10-CM | POA: Diagnosis present

## 2015-09-05 DIAGNOSIS — D649 Anemia, unspecified: Secondary | ICD-10-CM | POA: Diagnosis not present

## 2015-09-05 DIAGNOSIS — N183 Chronic kidney disease, stage 3 (moderate): Secondary | ICD-10-CM | POA: Insufficient documentation

## 2015-09-05 DIAGNOSIS — E1149 Type 2 diabetes mellitus with other diabetic neurological complication: Secondary | ICD-10-CM | POA: Diagnosis not present

## 2015-09-05 DIAGNOSIS — E7800 Pure hypercholesterolemia, unspecified: Secondary | ICD-10-CM | POA: Diagnosis not present

## 2015-09-05 DIAGNOSIS — D509 Iron deficiency anemia, unspecified: Secondary | ICD-10-CM | POA: Insufficient documentation

## 2015-09-05 DIAGNOSIS — I10 Essential (primary) hypertension: Secondary | ICD-10-CM | POA: Diagnosis present

## 2015-09-05 HISTORY — DX: Hypothyroidism, unspecified: E03.9

## 2015-09-05 LAB — URINALYSIS, ROUTINE W REFLEX MICROSCOPIC
BILIRUBIN URINE: NEGATIVE
Glucose, UA: NEGATIVE mg/dL
KETONES UR: NEGATIVE mg/dL
NITRITE: POSITIVE — AB
Protein, ur: 100 mg/dL — AB
SPECIFIC GRAVITY, URINE: 1.013 (ref 1.005–1.030)
pH: 6 (ref 5.0–8.0)

## 2015-09-05 LAB — PREPARE RBC (CROSSMATCH)

## 2015-09-05 LAB — BASIC METABOLIC PANEL
ANION GAP: 9 (ref 5–15)
BUN: 16 mg/dL (ref 6–20)
CHLORIDE: 105 mmol/L (ref 101–111)
CO2: 23 mmol/L (ref 22–32)
CREATININE: 1.19 mg/dL — AB (ref 0.44–1.00)
Calcium: 9.2 mg/dL (ref 8.9–10.3)
GFR calc non Af Amer: 42 mL/min — ABNORMAL LOW (ref >60)
GFR, EST AFRICAN AMERICAN: 49 mL/min — AB (ref >60)
Glucose, Bld: 182 mg/dL — ABNORMAL HIGH (ref 65–99)
POTASSIUM: 4.1 mmol/L (ref 3.5–5.1)
SODIUM: 137 mmol/L (ref 135–145)

## 2015-09-05 LAB — URINE MICROSCOPIC-ADD ON

## 2015-09-05 LAB — RETICULOCYTES
RBC.: 2.53 MIL/uL — ABNORMAL LOW (ref 3.87–5.11)
Retic Count, Absolute: 116.4 10*3/uL (ref 19.0–186.0)
Retic Ct Pct: 4.6 % — ABNORMAL HIGH (ref 0.4–3.1)

## 2015-09-05 LAB — I-STAT TROPONIN, ED: Troponin i, poc: 0.01 ng/mL (ref 0.00–0.08)

## 2015-09-05 LAB — CBC
HCT: 21.4 % — ABNORMAL LOW (ref 36.0–46.0)
Hemoglobin: 6.4 g/dL — CL (ref 12.0–15.0)
MCH: 25.6 pg — ABNORMAL LOW (ref 26.0–34.0)
MCHC: 29.9 g/dL — ABNORMAL LOW (ref 30.0–36.0)
MCV: 85.6 fL (ref 78.0–100.0)
PLATELETS: 165 10*3/uL (ref 150–400)
RBC: 2.5 MIL/uL — AB (ref 3.87–5.11)
RDW: 16.6 % — ABNORMAL HIGH (ref 11.5–15.5)
WBC: 6.9 10*3/uL (ref 4.0–10.5)

## 2015-09-05 LAB — FOLATE: Folate: 9.9 ng/mL (ref >5.9)

## 2015-09-05 LAB — FERRITIN: Ferritin: 44 ng/mL (ref 11–307)

## 2015-09-05 LAB — IRON AND TIBC
IRON: 36 ug/dL (ref 28–170)
SATURATION RATIOS: 9 % — AB (ref 10.4–31.8)
TIBC: 423 ug/dL (ref 250–450)
UIBC: 387 ug/dL

## 2015-09-05 LAB — GLUCOSE, CAPILLARY
GLUCOSE-CAPILLARY: 123 mg/dL — AB (ref 65–99)
Glucose-Capillary: 127 mg/dL — ABNORMAL HIGH (ref 65–99)

## 2015-09-05 LAB — POC OCCULT BLOOD, ED: Fecal Occult Bld: NEGATIVE

## 2015-09-05 LAB — VITAMIN B12: Vitamin B-12: 391 pg/mL (ref 180–914)

## 2015-09-05 MED ORDER — ACETAMINOPHEN 650 MG RE SUPP: 650.0000 mg | Freq: Four times a day (QID) | RECTAL | Status: DC | PRN

## 2015-09-05 MED ORDER — FERROUS SULFATE 325 (65 FE) MG PO TABS
325.0000 mg | ORAL_TABLET | Freq: Three times a day (TID) | ORAL | Status: DC
  Administered 2015-09-06: 325 mg via ORAL
  Filled 2015-09-05 (×2): qty 1

## 2015-09-05 MED ORDER — GABAPENTIN 100 MG PO CAPS
100.0000 mg | ORAL_CAPSULE | Freq: Three times a day (TID) | ORAL | Status: DC
  Administered 2015-09-05 – 2015-09-06 (×2): 100 mg via ORAL
  Filled 2015-09-05 (×2): qty 1

## 2015-09-05 MED ORDER — HYDROCODONE-ACETAMINOPHEN 5-325 MG PO TABS: 1.0000 | ORAL_TABLET | ORAL | Status: DC | PRN

## 2015-09-05 MED ORDER — NITROGLYCERIN 0.4 MG SL SUBL: 0.4000 mg | SUBLINGUAL_TABLET | SUBLINGUAL | Status: DC | PRN

## 2015-09-05 MED ORDER — ACETAMINOPHEN 325 MG PO TABS
650.0000 mg | ORAL_TABLET | Freq: Four times a day (QID) | ORAL | Status: DC | PRN
  Filled 2015-09-05: qty 2

## 2015-09-05 MED ORDER — LEVOTHYROXINE SODIUM 50 MCG PO TABS
50.0000 ug | ORAL_TABLET | Freq: Every day | ORAL | Status: DC
  Administered 2015-09-06: 50 ug via ORAL
  Filled 2015-09-05: qty 1

## 2015-09-05 MED ORDER — SODIUM CHLORIDE 0.9 % IV SOLN
INTRAVENOUS | Status: AC
Start: 2015-09-05 — End: 2015-09-06
  Administered 2015-09-05: 18:00:00 via INTRAVENOUS

## 2015-09-05 MED ORDER — DEXTROSE 5 % IV SOLN
1.0000 g | Freq: Once | INTRAVENOUS | Status: AC
  Administered 2015-09-05: 1 g via INTRAVENOUS
  Filled 2015-09-05: qty 10

## 2015-09-05 MED ORDER — INSULIN ASPART 100 UNIT/ML ~~LOC~~ SOLN: 0.0000 [IU] | Freq: Every day | SUBCUTANEOUS | Status: DC

## 2015-09-05 MED ORDER — SODIUM CHLORIDE 0.9 % IV SOLN
Freq: Once | INTRAVENOUS | Status: AC
  Administered 2015-09-05: 16:00:00 via INTRAVENOUS

## 2015-09-05 MED ORDER — METOPROLOL SUCCINATE ER 25 MG PO TB24
25.0000 mg | ORAL_TABLET | Freq: Two times a day (BID) | ORAL | Status: DC
  Administered 2015-09-05 – 2015-09-07 (×4): 25 mg via ORAL
  Filled 2015-09-05 (×4): qty 1

## 2015-09-05 MED ORDER — AZITHROMYCIN 500 MG PO TABS
500.0000 mg | ORAL_TABLET | ORAL | Status: DC
  Administered 2015-09-06: 500 mg via ORAL
  Filled 2015-09-05: qty 1

## 2015-09-05 MED ORDER — PANTOPRAZOLE SODIUM 40 MG IV SOLR
40.0000 mg | Freq: Every day | INTRAVENOUS | Status: DC
  Administered 2015-09-05: 40 mg via INTRAVENOUS
  Filled 2015-09-05: qty 40

## 2015-09-05 MED ORDER — SERTRALINE HCL 50 MG PO TABS
25.0000 mg | ORAL_TABLET | Freq: Every day | ORAL | Status: DC
  Administered 2015-09-06 – 2015-09-07 (×2): 25 mg via ORAL
  Filled 2015-09-05 (×2): qty 1

## 2015-09-05 MED ORDER — ONDANSETRON HCL 4 MG PO TABS: 4.0000 mg | ORAL_TABLET | Freq: Four times a day (QID) | ORAL | Status: DC | PRN

## 2015-09-05 MED ORDER — AZITHROMYCIN 250 MG PO TABS
500.0000 mg | ORAL_TABLET | Freq: Once | ORAL | Status: AC
  Administered 2015-09-05: 500 mg via ORAL
  Filled 2015-09-05: qty 2

## 2015-09-05 MED ORDER — SODIUM CHLORIDE 0.9 % IJ SOLN
3.0000 mL | Freq: Two times a day (BID) | INTRAMUSCULAR | Status: DC
  Administered 2015-09-05 – 2015-09-07 (×4): 3 mL via INTRAVENOUS

## 2015-09-05 MED ORDER — VITAMIN B-12 1000 MCG PO TABS
1000.0000 ug | ORAL_TABLET | Freq: Two times a day (BID) | ORAL | Status: DC
  Administered 2015-09-05 – 2015-09-06 (×2): 1000 ug via ORAL
  Filled 2015-09-05 (×2): qty 1

## 2015-09-05 MED ORDER — ONDANSETRON HCL 4 MG/2ML IJ SOLN: 4.0000 mg | Freq: Four times a day (QID) | INTRAMUSCULAR | Status: DC | PRN

## 2015-09-05 MED ORDER — LOSARTAN POTASSIUM 50 MG PO TABS: 50.0000 mg | ORAL_TABLET | Freq: Every day | ORAL | Status: DC

## 2015-09-05 MED ORDER — INSULIN ASPART 100 UNIT/ML ~~LOC~~ SOLN
0.0000 [IU] | Freq: Three times a day (TID) | SUBCUTANEOUS | Status: DC
Start: 2015-09-06 — End: 2015-09-07

## 2015-09-05 MED ORDER — DEXTROSE 5 % IV SOLN
1.0000 g | INTRAVENOUS | Status: DC
  Administered 2015-09-06: 1 g via INTRAVENOUS
  Filled 2015-09-05 (×3): qty 10

## 2015-09-05 MED ORDER — ATORVASTATIN CALCIUM 40 MG PO TABS
40.0000 mg | ORAL_TABLET | Freq: Every day | ORAL | Status: DC
  Administered 2015-09-06: 40 mg via ORAL
  Filled 2015-09-05: qty 1

## 2015-09-05 NOTE — ED Notes (Signed)
Pt transported to xray 

## 2015-09-05 NOTE — Telephone Encounter (Signed)
Agree with advice given

## 2015-09-05 NOTE — Progress Notes (Addendum)
Blood transfusion reaction and side effects reviewed with patient and family.Teach back incorporated.  All questions answered at this time. Consent signed.   Ave Filter, RN

## 2015-09-05 NOTE — ED Notes (Signed)
Pt reports sob with exertion for several days. Reports recent nonproductive cough and swelling to legs. Denies any cp.

## 2015-09-05 NOTE — Progress Notes (Signed)
Patient arrived to 3E20 accompanied by family from ED. Safety precautions and orders reviewed with patient/family. TELE applied and confirmed with Dwayne. No other distress noted. Will continue to monitor.   Ave Filter, RN

## 2015-09-05 NOTE — Telephone Encounter (Signed)
New message     Pt is on her way back to [place].  She is due to have an echo and see the doctor in a couple of weeks.  Daughter says patient has been sob and has not felt well while there.  She is nausea but has not cp.  Daughter is concerned.  She will be in [place] in about 3 hrs.  Please call daughter to see if pt needs to be seen here or go to the hosp when she gets to [place].

## 2015-09-05 NOTE — Progress Notes (Signed)
Notified of consult for anemia by Dr Tomi Bamberger.   We will see pt in AM.  Note/review of chart started.   Azucena Freed PA-C

## 2015-09-05 NOTE — Telephone Encounter (Signed)
See your followup note today.  Agree.

## 2015-09-05 NOTE — ED Provider Notes (Addendum)
CSN: [id number]     Arrival date & time 09/05/15  1233 History  First MD Initiated Contact with Patient 09/05/15 1501     Chief Complaint  Patient presents with  . Shortness of Breath    Patient is a 81 y.o. female presenting with shortness of breath. The history is provided by the patient.  Shortness of Breath Severity:  Moderate Onset quality:  Gradual Duration:  2 weeks Timing:  Intermittent Progression:  Worsening Relieved by:  Nothing Worsened by:  Activity Associated symptoms: no chest pain and no fever   She saw her doctor on the first.  She is set up to have some additional tests.  Last night her symptoms got worse.  Appetite decreased with nausea and vomiting.  No abdominal pain.  She has been urinating frequently. She denies any blood in her stools or dark stools  Past Medical History  Diagnosis Date  . Cellulitis     R ARM  . Coronary artery disease   . Thyroid disease   . Hyperlipidemia   . Hypertension    Past Surgical History  Procedure Laterality Date  . Total abdominal hysterectomy  1993  . Mastectomy  1979    right, followed by 2 years chemo  . Total hip arthroplasty  08/2009    right  . Appendectomy    . US echocardiography  05/2009    EF 55-60%,MILD-MOD LVH,, MILD-MOD A STENOSIS,  . Nuclear stress test  07/2009    EF 69%,  MILD ISCHEMIA IN INFEROLATERAL WALL  . Coronary artery bypass graft  2008  . Cardiac catheterization     Family History  Problem Relation Age of Onset  . Cancer Mother     ? stomach cancer  . Diabetes Mother   . Heart disease Father 35  . Diabetes Brother   . Throat cancer Brother    Social History  Substance Use Topics  . Smoking status: Never Smoker   . Smokeless tobacco: Never Used  . Alcohol Use: No   OB History    No data available     Review of Systems  Constitutional: Negative for fever.  Respiratory: Positive for shortness of breath.   Cardiovascular: Negative for chest pain.  Gastrointestinal: Negative  for blood in stool.  Genitourinary: Positive for dysuria.  All other systems reviewed and are negative.     Allergies  Morphine and related  Home Medications   Prior to Admission medications   Medication Sig Start Date End Date Taking? Authorizing Provider  aspirin 81 MG tablet Take 81 mg by mouth daily.      Historical Provider, MD  atorvastatin (LIPITOR) 40 MG tablet Take 1 tablet (40 mg total) by mouth daily. 08/28/15   Brittainy Erie Noe, PA-C  cyanocobalamin 1000 MCG tablet Take 100 mcg by mouth 2 (two) times daily.    Historical Provider, MD  Ferrous Sulfate (IRON) 325 (65 FE) MG TABS Take 1 tablet by mouth 3 (three) times daily.     Historical Provider, MD  gabapentin (NEURONTIN) 100 MG capsule Take 100 mg by mouth 3 (three) times daily.    Historical Provider, MD  glipiZIDE (GLUCOTROL XL) 10 MG 24 hr tablet TAKE 1 TABLET BY MOUTH EVERY DAY 06/06/15   Amy E Diona Browner, MD  levothyroxine (SYNTHROID, LEVOTHROID) 50 MCG tablet TAKE 1 TABLET BY MOUTH EVERY DAY 01/26/15   Amy Cletis Athens, MD  losartan (COZAAR) 50 MG tablet Take 1 tablet (50 mg total) by mouth daily. 08/28/15  Brittainy Erie Noe, PA-C  metoprolol succinate (TOPROL-XL) 25 MG 24 hr tablet Take 1 tablet (25 mg total) by mouth 2 (two) times daily. 08/28/15   Brittainy Erie Noe, PA-C  nitroGLYCERIN (NITROSTAT) 0.4 MG SL tablet Place 1 tablet (0.4 mg total) under the tongue every 5 (five) minutes as needed for chest pain (x 3 tabs daily). 08/28/15   Brittainy Erie Noe, PA-C  pioglitazone (ACTOS) 30 MG tablet Take 30 mg by mouth daily.    Historical Provider, MD  sertraline (ZOLOFT) 25 MG tablet TAKE 1 TABLET (25 MG TOTAL) BY MOUTH DAILY. 06/29/15   Amy Cletis Athens, MD  triamterene-hydrochlorothiazide (DYAZIDE) 37.5-25 MG capsule Take 1 each (1 capsule total) by mouth daily. 08/28/15   Brittainy M Simmons, PA-C   BP 165/62 mmHg  Pulse 64  Temp(Src) 97.6 F (36.4 C) (Oral)  Resp 18  Ht 5\' 4"  (1.626 m)  Wt 88.361 kg  BMI 33.42 kg/m2   SpO2 99% Physical Exam  Constitutional: No distress.  HENT:  Head: Normocephalic and atraumatic.  Right Ear: External ear normal.  Left Ear: External ear normal.  Eyes: Conjunctivae are normal. Right eye exhibits no discharge. Left eye exhibits no discharge. No scleral icterus.  Neck: Neck supple. No tracheal deviation present.  Cardiovascular: Normal rate, regular rhythm and intact distal pulses.   Pulmonary/Chest: Effort normal and breath sounds normal. No stridor. No respiratory distress. She has no wheezes. She has no rales.  Abdominal: Soft. Bowel sounds are normal. She exhibits no distension. There is no tenderness. There is no rebound and no guarding.  Musculoskeletal: She exhibits no edema or tenderness.  Neurological: She is alert. She has normal strength. No cranial nerve deficit (no facial droop, extraocular movements intact, no slurred speech) or sensory deficit. She exhibits normal muscle tone. She displays no seizure activity. Coordination normal.  Skin: Skin is warm and dry. No rash noted. There is pallor.  Psychiatric: She has a normal mood and affect.  Nursing note and vitals reviewed.   ED Course  Procedures (including critical care time) Labs Review Labs Reviewed  BASIC METABOLIC PANEL - Abnormal; Notable for the following:    Glucose, Bld 182 (*)    Creatinine, Ser 1.19 (*)    GFR calc non Af Amer 42 (*)    GFR calc Af Amer 49 (*)    All other components within normal limits  CBC - Abnormal; Notable for the following:    RBC 2.50 (*)    Hemoglobin 6.4 (*)    HCT 21.4 (*)    MCH 25.6 (*)    MCHC 29.9 (*)    RDW 16.6 (*)    All other components within normal limits  URINALYSIS, ROUTINE W REFLEX MICROSCOPIC (NOT AT [facility])  I-STAT TROPOININ, ED  PREPARE RBC (CROSSMATCH)    Imaging Review Dg Chest 2 View  09/05/2015  CLINICAL DATA:  Shortness of breath for 1 week EXAM: CHEST  2 VIEW COMPARISON:  October 11, 2014 FINDINGS: There is consolidation in the lateral  and posterior left base regions with small left effusion. Lungs elsewhere clear. Heart is mildly enlarged with pulmonary vascularity within normal limits. Patient is status post coronary artery bypass grafting. No adenopathy. There is a hiatal hernia present. There is atherosclerotic change throughout the aorta. No bone lesions. IMPRESSION: Infiltrate left base with small left effusion. Moderate hiatal hernia, stable. Stable cardiomegaly. Followup PA and lateral chest radiographs recommended in 3-4 weeks following trial of antibiotic therapy to ensure resolution and  exclude underlying malignancy. Electronically Signed   By: Lowella Grip III M.D.   On: 09/05/2015 13:41   I have personally reviewed and evaluated these images and lab results as part of my medical decision-making.   EKG Interpretation   Date/Time:  Friday September 05 2015 12:44:04 EST Ventricular Rate:  63 PR Interval:  224 QRS Duration: 74 QT Interval:  430 QTC Calculation: 440 R Axis:   77 Text Interpretation:  Sinus rhythm with 1st degree A-V block Nonspecific  ST abnormality Abnormal ECG No significant change since last tracing  Confirmed by Juanna Pudlo  MD-J, Babe Anthis [id number]) on 09/05/2015 3:16:45 PM      MDM   Final diagnoses:  Anemia, unspecified anemia type  CAP (community acquired pneumonia)    Pt presents to the ED with fatigue and shortness of breath.  Lab tests shows that she has significant anemia that is new compared to previous values.  Pt states she has a history of anemia requiring blood transfusions.  They are not sure why this has happened to her before.  She has had GI workups.  Will guaic stools.  Transfuse blood.  Xray shows pna.  Will start treatment for CAP.  Consult with medical service for admission.  Dorie Rank, MD 09/05/15 [phone] Spoke with Luz Brazen.  Will see patient in the AM  Dorie Rank, MD 09/05/15 [phone]

## 2015-09-05 NOTE — H&P (Signed)
[facility] History and Physical  ELICE MATSUNO [id number] DOB: 09-10-1933 DOA: 09/05/2015  Referring physician:knapp PCP: Eliezer Lofts, MD   Chief Complaint: sob/weakness  HPI: [name] is a very pleasant 81 y.o. female past medical history includes ischemic heart disease status post CABG [place] acute MI 2008, DOE, aortic stenosis, diabetes hypertension presents to the emergency department with the chief complaint of gradual worsening and persistent shortness of breath and generalized weakness. Initial evaluation in the emergency department reveals hemoglobin of 6.4 chest x-ray concerning for community-acquired pneumonia.  Information obtained from the patient he reports she was in her usual state of health until about 3 weeks ago when she developed shortness of breath that she attributed to an allergic reaction to sunflower she received. However this shortness of breath persisted and worsened. Associated symptoms include intermittent nonproductive cough generalized weakness. She denies headache visual disturbances dizziness syncope or near-syncope. She denies any abdominal pain nausea vomiting constipation diarrhea. She denies fever chills or recent sick contacts. She denies any melena or bright red blood per rectum. She does report being transfused in the past for low hemoglobin believe she had a colonoscopy about 5 years ago.  The emergency department she is afebrile hemodynamically stable and not hypoxic.  Review of Systems:  10 point review of systems complete and all systems are negative except as indicated in the history of present illness   Past Medical History  Diagnosis Date  . Cellulitis     R ARM  . Coronary artery disease   . Hypothyroidism   . Hyperlipidemia   . Hypertension    Past Surgical History  Procedure Laterality Date  . Total abdominal hysterectomy  1993  . Mastectomy  1979    right, followed by 2 years chemo  . Total hip arthroplasty   08/2009    right  . Appendectomy    . US echocardiography  05/2009    EF 55-60%,MILD-MOD LVH,, MILD-MOD A STENOSIS,  . Nuclear stress test  07/2009    EF 69%,  MILD ISCHEMIA IN INFEROLATERAL WALL  . Coronary artery bypass graft  2008  . Cardiac catheterization     Social History:  reports that she has never smoked. She has never used smokeless tobacco. She reports that she does not drink alcohol or use illicit drugs. She lives alone she is retired she is independent with ADL's Allergies  Allergen Reactions  . Morphine And Related Other (See Comments)    BAD HEADACHE    Family History  Problem Relation Age of Onset  . Cancer Mother     ? stomach cancer  . Diabetes Mother   . Heart disease Father 48  . Diabetes Brother   . Throat cancer Brother     Prior to Admission medications   Medication Sig Start Date End Date Taking? Authorizing Provider  aspirin 81 MG tablet Take 81 mg by mouth daily.     Yes Historical Provider, MD  atorvastatin (LIPITOR) 40 MG tablet Take 1 tablet (40 mg total) by mouth daily. 08/28/15  Yes Brittainy Erie Noe, PA-C  cyanocobalamin 1000 MCG tablet Take 100 mcg by mouth once a week.    Yes Historical Provider, MD  Ferrous Sulfate (IRON) 325 (65 FE) MG TABS Take 1 tablet by mouth 2 (two) times daily.    Yes Historical Provider, MD  gabapentin (NEURONTIN) 100 MG capsule Take 100 mg by mouth 3 (three) times daily.   Yes Historical Provider, MD  glipiZIDE (GLUCOTROL XL) 10  MG 24 hr tablet TAKE 1 TABLET BY MOUTH EVERY DAY 06/06/15  Yes Amy E Bedsole, MD  levothyroxine (SYNTHROID, LEVOTHROID) 50 MCG tablet TAKE 1 TABLET BY MOUTH EVERY DAY 01/26/15  Yes Amy E Bedsole, MD  losartan (COZAAR) 50 MG tablet Take 1 tablet (50 mg total) by mouth daily. 08/28/15  Yes Brittainy Erie Noe, PA-C  metoprolol succinate (TOPROL-XL) 25 MG 24 hr tablet Take 1 tablet (25 mg total) by mouth 2 (two) times daily. 08/28/15  Yes Brittainy Erie Noe, PA-C  nitroGLYCERIN (NITROSTAT) 0.4 MG SL  tablet Place 1 tablet (0.4 mg total) under the tongue every 5 (five) minutes as needed for chest pain (x 3 tabs daily). 08/28/15  Yes Brittainy Erie Noe, PA-C  pioglitazone (ACTOS) 30 MG tablet Take 30 mg by mouth daily.   Yes Historical Provider, MD  sertraline (ZOLOFT) 25 MG tablet TAKE 1 TABLET (25 MG TOTAL) BY MOUTH DAILY. 06/29/15  Yes Amy Cletis Athens, MD  triamterene-hydrochlorothiazide (DYAZIDE) 37.5-25 MG capsule Take 1 each (1 capsule total) by mouth daily. 08/28/15  Yes Brittainy Erie Noe, PA-C   Physical Exam: Filed Vitals:   09/05/15 1255 09/05/15 1500 09/05/15 1515 09/05/15 1545  BP:  168/66 175/64 180/66  Pulse:  65 65 65  Temp:      TempSrc:      Resp:  21 16 17   Height:      Weight: 88.361 kg (194 lb 12.8 oz)     SpO2:  100% 98% 95%    Wt Readings from Last 3 Encounters:  09/05/15 88.361 kg (194 lb 12.8 oz)  08/28/15 87.599 kg (193 lb 1.9 oz)  07/25/15 87.091 kg (192 lb)    General:  Appears calm and comfortable, somewhat pale Eyes: PERRL, normal lids,  Conjunctiva pale ENT: grossly normal hearing,  Neck: no LAD, masses or thyromegaly Cardiovascular: Cardiac but regular, no m/r/g. No LE edema.  Respiratory: CTA bilaterally, no w/r/r. Mild increased work of breathing with conversation Abdomen: soft, ntnd positive bowel sounds throughout no guarding or rebounding Skin: no rash or induration seen on limited exam Musculoskeletal: grossly normal tone BUE/BLE Psychiatric: grossly normal mood and affect, speech fluent and appropriate Neurologic: grossly non-focal. speech clear facial symmetry moves all extremities follows commands           Labs on Admission:  Basic Metabolic Panel:  Recent Labs Lab 09/05/15 1254  NA 137  K 4.1  CL 105  CO2 23  GLUCOSE 182*  BUN 16  CREATININE 1.19*  CALCIUM 9.2   Liver Function Tests: No results for input(s): AST, ALT, ALKPHOS, BILITOT, PROT, ALBUMIN in the last 168 hours. No results for input(s): LIPASE, AMYLASE in the  last 168 hours. No results for input(s): AMMONIA in the last 168 hours. CBC:  Recent Labs Lab 09/05/15 1254  WBC 6.9  HGB 6.4*  HCT 21.4*  MCV 85.6  PLT 165   Cardiac Enzymes: No results for input(s): CKTOTAL, CKMB, CKMBINDEX, TROPONINI in the last 168 hours.  BNP (last 3 results) No results for input(s): BNP in the last 8760 hours.  ProBNP (last 3 results) No results for input(s): PROBNP in the last 8760 hours.   CREATININE: 1.19 mg/dL ABNORMAL (09/05/15 1254) Estimated creatinine clearance - 40.6 mL/min  CBG: No results for input(s): GLUCAP in the last 168 hours.  Radiological Exams on Admission: Dg Chest 2 View  09/05/2015  CLINICAL DATA:  Shortness of breath for 1 week EXAM: CHEST  2 VIEW COMPARISON:  October 11, 2014 FINDINGS: There is consolidation in the lateral and posterior left base regions with small left effusion. Lungs elsewhere clear. Heart is mildly enlarged with pulmonary vascularity within normal limits. Patient is status post coronary artery bypass grafting. No adenopathy. There is a hiatal hernia present. There is atherosclerotic change throughout the aorta. No bone lesions. IMPRESSION: Infiltrate left base with small left effusion. Moderate hiatal hernia, stable. Stable cardiomegaly. Followup PA and lateral chest radiographs recommended in 3-4 weeks following trial of antibiotic therapy to ensure resolution and exclude underlying malignancy. Electronically Signed   By: Lowella Grip III M.D.   On: 09/05/2015 13:41    EKG: Independently reviewed. Sinus rhythm with 1st degree A-V block Nonspecific ST abnormality No significant change since last tracing  Assessment/Plan Principal Problem:   CAP (community acquired pneumonia) Active Problems:   Diabetes mellitus with neurological manifestation (HCC)   Hypothyroidism   High cholesterol   HTN (hypertension)   Iron deficiency anemia   CAD (coronary artery disease)   Renal insufficiency   Anemia    Dyspnea  #1. Dyspnea. Likely multifactorial specifically community-acquired pneumonia in the setting of acute anemia. Chest x-ray reveals infiltrate left base with small left effusion. Moderate hiatal hernia, stable. Stable cardiomegaly. No leukocytosis afebrile and nontoxic appearing -Admit to telemetry -Anabiotic per protocol -Oxygen supplementation as indicated -Will obtain FOBT -We'll transfuse 2 units packed RBCs -GI consult -She reports no NSAID use. Reports colonoscopy approximately 5 years ago unsure results -She also reports having required transfusions in the past for anemia but "source never found"  #2. Community-acquired pneumonia. She's afebrile nontoxic appearing. Chest x-ray as noted above -See #1 -Antibiotics per protocol -Sputum culture and blood culture -Strep pneumo urine antigen and Legionella urine antigen -Oxygen supplementation as indicated -On admission she is afebrile and nontoxic appearing  #3. Acute on chronic anemia. Hemoglobin 6.4 on admission. Most recent CBC in chart dated January 2016 and hemoglobin was 13.4 -PPI -Denies NSAID use -Serial CBC -2 units of packed RBCs -Anemia panel  #4. Diabetes. Hold oral agents for now -Obtain. hemoglobin A1c -Sliding scale insulin for optimal control  #5. CAD. EKG without acute changes. Denies chest pain and initial troponin negative -Continue home meds  #6. Hypertension. Controlled. -Continue home meds  #7. Renal insufficiency. History of same. -Appears at baseline -Gentle IV fluids -Recheck  GI will see in am   ode Status: full DVT Prophylaxis: Family Communication: son at bedside Disposition Plan: home 2-3 days  Time spent: 19 minutres  [facility]

## 2015-09-05 NOTE — ED Notes (Signed)
Pt transported to [place]. Pt belongings with family at bedside in belonging bag.

## 2015-09-05 NOTE — Telephone Encounter (Signed)
Spoke with patient and she gave verbal ok to speak with her daughter Patient told daughter last night that she thought she may have a UTI (urge to urinate) and then had several episodes of vomiting during night  Per daughter she noticed her mothers color not looking good/pale and her face looked puffy when she arrived a couple of days ago.  Daughter had noticed increased shortness of breath with very little exertion even just getting dressed.  Did explain to the daughter I had spoken to patient yesterday and she did not mention anything but the shortness of breath over the last month or so with exertion. Apologized to daughter for not offering her more options.  Patient has history of low hgb but denies any black/dark stools or visible bleeding Advised daughter with the multiple complaints she needed to go to Urgent Care or ED for possible chest Xray, urinalysis, and lab work that could be done/resulted right away as we send labs out and xray done elsewhere Daughter stated her mother was on her way back to town and very appreciative for recommendations

## 2015-09-05 NOTE — Consult Note (Addendum)
Gastroenterology Consult: 7:59 AM 09/06/2015  LOS: 1 day    Referring Provider: Dr Tomi Bamberger  Primary Care Physician:  Eliezer Lofts, MD Primary Gastroenterologist:  Dr. Carlean Purl    Reason for Consultation:  Chronic iron deficiency anemia.     HPI: [name] is a 81 y.o. female.  Hx CAD.  S/p 2008 CABG.  n 83 ASA.  Hx iron def anemia, on po iron and B12. transfused 12/2013 (Hgb 6.8, outpt PRBC x 1) and 01/2012 Hypothyroidism. Mastectomy 1979  and chemo 2 yrs for breast cancer.  CKD stage 3. Type 2 DM.  No longer on PPI  but takes her iron pills twice a day..    2009 Colonoscopy, for iron def anemia, FOBT +.  Dr Carlean Purl.   Mild sigmoid tics, diminutive sigmoid polyp (tubular adenoma)  2009 EGD.  Erosive gastritis, 5cm HH. H Pylori negative. GASTRITS IS MILD-MODERATE, WOULD TREAT WITH PPI X 2 MONTHS BUT DONOT THINK SHE NEEDS TO TAKE A PPI FOREVER AS NO EVIDENCE OF GI HEMORRHAGE. NO FURTHER GI ENDOSCPIC EVAL AT THIS TIME, IF DOES NOT RESPOND TO IRON SUPPLEMENTATION OR OTHER EVIDENCE OF BLEEDING WOULD DO CAPSULE ENDOSCOPY OF SMALL BOWEL  Came to ED 12/9 c/o SOB. Gradual and progressive onset over 3 weeks.intermittent dry cough. No chest pain, no dizziness/syncope.   No fever, chills, sick contacts.  + anorexia, decreased po since before Thanksgiving.    12/8 in evening having n/v.  No melena or blood in stool  Hgb 6.4 with MCV 85.  Hgb 12 on 10/22/14. S/p PRBC x 2.  Hgb today is 8.1.  BUN is not elevated  FOBT - Cxr: small left pleural effusion, moderate HH, stable CM.    The patient gives history of having been treated and diagnosed with anemia as far back as age 13-10 so anemia has been a lifelong issue for her.  She does not recall ever having been seen by a hematologist. No one in her family has issues with  anemia. She also mentions increased urinary frequency with smaller volumes of urine and feels like she has a UTI. UA is suggestive of UTI, culture has not been obtained.   Past Medical History  Diagnosis Date  . Cellulitis     R ARM  . Coronary artery disease   . Hypothyroidism   . Hyperlipidemia   . Hypertension     Past Surgical History  Procedure Laterality Date  . Total abdominal hysterectomy  1993  . Mastectomy  1979    right, followed by 2 years chemo  . Total hip arthroplasty  08/2009    right  . Appendectomy    . US echocardiography  05/2009    EF 55-60%,MILD-MOD LVH,, MILD-MOD A STENOSIS,  . Nuclear stress test  07/2009    EF 69%,  MILD ISCHEMIA IN INFEROLATERAL WALL  . Coronary artery bypass graft  2008  . Cardiac catheterization      Prior to Admission medications   Medication Sig Start Date End Date Taking? Authorizing Provider  aspirin 81 MG tablet  Take 81 mg by mouth daily.     Yes Historical Provider, MD  atorvastatin (LIPITOR) 40 MG tablet Take 1 tablet (40 mg total) by mouth daily. 08/28/15  Yes Brittainy Erie Noe, PA-C  cyanocobalamin 1000 MCG tablet Take 100 mcg by mouth once a week.    Yes Historical Provider, MD  Ferrous Sulfate (IRON) 325 (65 FE) MG TABS Take 1 tablet by mouth 2 (two) times daily.    Yes Historical Provider, MD  gabapentin (NEURONTIN) 100 MG capsule Take 100 mg by mouth 3 (three) times daily.   Yes Historical Provider, MD  glipiZIDE (GLUCOTROL XL) 10 MG 24 hr tablet TAKE 1 TABLET BY MOUTH EVERY DAY 06/06/15  Yes Amy E Bedsole, MD  levothyroxine (SYNTHROID, LEVOTHROID) 50 MCG tablet TAKE 1 TABLET BY MOUTH EVERY DAY 01/26/15  Yes Amy E Bedsole, MD  losartan (COZAAR) 50 MG tablet Take 1 tablet (50 mg total) by mouth daily. 08/28/15  Yes Brittainy Erie Noe, PA-C  metoprolol succinate (TOPROL-XL) 25 MG 24 hr tablet Take 1 tablet (25 mg total) by mouth 2 (two) times daily. 08/28/15  Yes Brittainy Erie Noe, PA-C  nitroGLYCERIN (NITROSTAT) 0.4 MG SL  tablet Place 1 tablet (0.4 mg total) under the tongue every 5 (five) minutes as needed for chest pain (x 3 tabs daily). 08/28/15  Yes Brittainy Erie Noe, PA-C  pioglitazone (ACTOS) 30 MG tablet Take 30 mg by mouth daily.   Yes Historical Provider, MD  sertraline (ZOLOFT) 25 MG tablet TAKE 1 TABLET (25 MG TOTAL) BY MOUTH DAILY. 06/29/15  Yes Amy Cletis [place], MD  triamterene-hydrochlorothiazide (DYAZIDE) 37.5-25 MG capsule Take 1 each (1 capsule total) by mouth daily. 08/28/15  Yes Brittainy Erie Noe, PA-C    Scheduled Meds:  Infusions:  PRN Meds:    Allergies as of 09/05/2015 - Review Complete 09/05/2015  Allergen Reaction Noted  . Morphine and related Other (See Comments) 01/26/2011    Family History  Problem Relation Age of Onset  . Cancer Mother     ? stomach cancer  . Diabetes Mother   . Heart disease Father 62  . Diabetes Brother   . Throat cancer Brother     Social History   Social History  . Marital Status: Divorced    Spouse Name: N/A  . Number of Children: N/A  . Years of Education: N/A   Occupational History  . Not on file.   Social History Main Topics  . Smoking status: Never Smoker   . Smokeless tobacco: Never Used  . Alcohol Use: No  . Drug Use: No  . Sexual Activity: Not on file   Other Topics Concern  . Not on file   Social History Narrative    REVIEW OF SYSTEMS: Constitutional:  Weight stable.  ENT:  No nose bleeds Pulm:  Per HPI CV:  No palpitations, no LE edema.  GU:  No hematuria, no frequency GI:  Per HPI Heme:  Never has seen hematlogist   Transfusions:  Per HPI Neuro:  No headaches, no peripheral tingling or numbness Derm:  No itching, no rash or sores.  Endocrine:  No sweats or chills.  No polyuria or dysuria Immunization:  Not queried Travel:  None beyond local counties in last few months.    PHYSICAL EXAM: Vital signs in last 24 hours: Filed Vitals:   09/06/15 0105 09/06/15 0544  BP: 170/57 155/70  Pulse: 67 63  Temp:  97.3 F (36.3 C) 97.2 F (36.2 C)  Resp: 17 18  Wt Readings from Last 3 Encounters:  09/06/15 87.3 kg (192 lb 7.4 oz)  08/28/15 87.599 kg (193 lb 1.9 oz)  07/25/15 87.091 kg (192 lb)    General: Pleasant, pale, elderly but not frail-appearing Head:  No facial asymmetry or swelling.  Eyes:  No scleral icterus. Conjunctiva is pale Ears:  Slightly diminished hearing  Nose:  No discharge or congestion Mouth:  Upper denture in place. Mucous membranes are pink and moist. Neck:  No mass, no TMG, no JVD. Lungs:  No labored breathing or cough. A few dry crackles in the left base. No cough. Heart: RRR. No MRG. S1/S2 audible Abdomen:  Soft. Active bowel sounds. No masses.  No HSM.  NT, ND.Marland Kitchen   Rectal: Did not perform. In the lab stool was FOBT negative   Musc/Skeltl: No joint erythema or swelling. Extremities:  No CCE.  Neurologic:  Oriented 3. No tremor, no limb weakness. No gross deficits. Skin:  No telangiectasia, sores, rashes Tattoos:  None Nodes:  No cervical adenopathy   Psych:  Pleasant, calm, cooperative. Fully engaged.  Intake/Output from previous day: 12/09 0701 - 12/10 0700 In: 1701.7 [P.O.:480; I.V.:606.7; Blood:615] Out: 175 [Urine:175] Intake/Output this shift:    LAB RESULTS:  Recent Labs  09/05/15 1254 09/06/15 0348  WBC 6.9 7.5  HGB 6.4* 8.1*  HCT 21.4* 25.8*  PLT 165 151   BMET Lab Results  Component Value Date   NA 137 09/06/2015   NA 137 09/05/2015   NA 139 07/25/2015   K 3.5 09/06/2015   K 4.1 09/05/2015   K 4.6 07/25/2015   CL 106 09/06/2015   CL 105 09/05/2015   CL 107 07/25/2015   CO2 22 09/06/2015   CO2 23 09/05/2015   CO2 23 07/25/2015   GLUCOSE 80 09/06/2015   GLUCOSE 182* 09/05/2015   GLUCOSE 189* 07/25/2015   BUN 17 09/06/2015   BUN 16 09/05/2015   BUN 47* 07/25/2015   CREATININE 1.16* 09/06/2015   CREATININE 1.19* 09/05/2015   CREATININE 1.32* 07/25/2015   CALCIUM 8.8* 09/06/2015   CALCIUM 9.2 09/05/2015   CALCIUM 9.2  07/25/2015   LFT No results for input(s): PROT, ALBUMIN, AST, ALT, ALKPHOS, BILITOT, BILIDIR, IBILI in the last 72 hours. PT/INR Lab Results  Component Value Date   INR 1.96* 09/08/2009   INR 1.84* 09/07/2009   INR 1.62* 09/06/2009   Hepatitis Panel No results for input(s): HEPBSAG, HCVAB, HEPAIGM, HEPBIGM in the last 72 hours. C-Diff No components found for: CDIFF Lipase     Component Value Date/Time   LIPASE 47 10/11/2014 1202    Drugs of Abuse  No results found for: LABOPIA, COCAINSCRNUR, LABBENZ, AMPHETMU, THCU, LABBARB   RADIOLOGY STUDIES: Dg Chest 2 View  09/05/2015  CLINICAL DATA:  Shortness of breath for 1 week EXAM: CHEST  2 VIEW COMPARISON:  October 11, 2014 FINDINGS: There is consolidation in the lateral and posterior left base regions with small left effusion. Lungs elsewhere clear. Heart is mildly enlarged with pulmonary vascularity within normal limits. Patient is status post coronary artery bypass grafting. No adenopathy. There is a hiatal hernia present. There is atherosclerotic change throughout the aorta. No bone lesions. IMPRESSION: Infiltrate left base with small left effusion. Moderate hiatal hernia, stable. Stable cardiomegaly. Followup PA and lateral chest radiographs recommended in 3-4 weeks following trial of antibiotic therapy to ensure resolution and exclude underlying malignancy. Electronically Signed   By: Lowella Grip III M.D.   On: 09/05/2015 13:41    ENDOSCOPIC  STUDIES: Per HPI  IMPRESSION:   *  Iron deficiency anemia, symptomatic with dyspnea.  FOBT negative. Anemia has been an issue since childhood. Previous transfusions in 2013 and 2015 S/p PRBC x2 with improved Hgb.   *  Dyspnea, likely due to anemia.  Small left pleural effusion, concern for CAP.  On Levaquin and Rocephin.  *  ? UTI. On Rocephin/Levaquin.    PLAN:     *  Colon/enterscopy tomorrow d/w pt ad her pleasant family, they are agreeable. Split dose prep tonite.  *  Stop  the by mouth iron in order to facilitate bowel prep. *  May need outpatient hematology consultation   Azucena Freed  09/06/2015, 7:59 AM Pager: [phone]      Attending physician's note   I have taken a history, examined the patient and reviewed the chart. I agree with the Advanced Practitioner's note, impression and recommendations. Chronic, recurrent iron deficiency anemia and heme negative stool. Recommended colonoscopy and EGD/enteroscopy however patient and her family decline colonoscopy. They will further consider EGD/enteroscopy. They also are not sure they want any GI procedures since they were negative in 2009. Recommend proceeding with IV Fe while in hospital. Recommend outpatient Hematology consultation.  Lucio Edward, MD [facility] [phone] Mon-Fri 8a-5p [phone] after 5p, weekends, holidays    Addendum at 1406. Patient and family decline EGD/enteroscopy too. They do not want any GI evaluation at this time. Outpatient GI follow up with Dr. Carlean Purl as needed. Recommend outpatient follow up of anemia with PCP and Hematology. GI signing off.

## 2015-09-06 DIAGNOSIS — J189 Pneumonia, unspecified organism: Principal | ICD-10-CM

## 2015-09-06 DIAGNOSIS — D509 Iron deficiency anemia, unspecified: Secondary | ICD-10-CM

## 2015-09-06 LAB — TYPE AND SCREEN
ABO/RH(D): O POS
ANTIBODY SCREEN: NEGATIVE
UNIT DIVISION: 0
UNIT DIVISION: 0

## 2015-09-06 LAB — BASIC METABOLIC PANEL
Anion gap: 9 (ref 5–15)
BUN: 17 mg/dL (ref 6–20)
CHLORIDE: 106 mmol/L (ref 101–111)
CO2: 22 mmol/L (ref 22–32)
CREATININE: 1.16 mg/dL — AB (ref 0.44–1.00)
Calcium: 8.8 mg/dL — ABNORMAL LOW (ref 8.9–10.3)
GFR calc non Af Amer: 43 mL/min — ABNORMAL LOW (ref >60)
GFR, EST AFRICAN AMERICAN: 50 mL/min — AB (ref >60)
Glucose, Bld: 80 mg/dL (ref 65–99)
Potassium: 3.5 mmol/L (ref 3.5–5.1)
Sodium: 137 mmol/L (ref 135–145)

## 2015-09-06 LAB — CBC
HCT: 25.8 % — ABNORMAL LOW (ref 36.0–46.0)
HEMOGLOBIN: 8.1 g/dL — AB (ref 12.0–15.0)
MCH: 26 pg (ref 26.0–34.0)
MCHC: 31.4 g/dL (ref 30.0–36.0)
MCV: 82.7 fL (ref 78.0–100.0)
PLATELETS: 151 10*3/uL (ref 150–400)
RBC: 3.12 MIL/uL — AB (ref 3.87–5.11)
RDW: 18.3 % — ABNORMAL HIGH (ref 11.5–15.5)
WBC: 7.5 10*3/uL (ref 4.0–10.5)

## 2015-09-06 LAB — HEMOGLOBIN A1C
HEMOGLOBIN A1C: 6.8 % — AB (ref 4.8–5.6)
MEAN PLASMA GLUCOSE: 148 mg/dL

## 2015-09-06 LAB — GLUCOSE, CAPILLARY
GLUCOSE-CAPILLARY: 108 mg/dL — AB (ref 65–99)
GLUCOSE-CAPILLARY: 111 mg/dL — AB (ref 65–99)
GLUCOSE-CAPILLARY: 101 mg/dL — AB (ref 65–99)
Glucose-Capillary: 82 mg/dL (ref 65–99)

## 2015-09-06 MED ORDER — SODIUM CHLORIDE 0.9 % IV SOLN
510.0000 mg | Freq: Once | INTRAVENOUS | Status: AC
  Administered 2015-09-06: 510 mg via INTRAVENOUS
  Filled 2015-09-06 (×2): qty 17

## 2015-09-06 MED ORDER — PEG-KCL-NACL-NASULF-NA ASC-C 100 G PO SOLR
0.5000 | Freq: Once | ORAL | Status: DC
  Filled 2015-09-06: qty 1

## 2015-09-06 MED ORDER — BISACODYL 5 MG PO TBEC: 5.0000 mg | DELAYED_RELEASE_TABLET | Freq: Four times a day (QID) | ORAL | Status: AC

## 2015-09-06 MED ORDER — METOCLOPRAMIDE HCL 5 MG/ML IJ SOLN
10.0000 mg | Freq: Four times a day (QID) | INTRAMUSCULAR | Status: AC | PRN
Start: 2015-09-06 — End: 2015-09-07

## 2015-09-06 MED ORDER — SODIUM CHLORIDE 0.9 % IV SOLN: INTRAVENOUS | Status: DC

## 2015-09-06 MED ORDER — LOSARTAN POTASSIUM 50 MG PO TABS
50.0000 mg | ORAL_TABLET | Freq: Every day | ORAL | Status: DC
  Administered 2015-09-06 – 2015-09-07 (×2): 50 mg via ORAL
  Filled 2015-09-06 (×2): qty 1

## 2015-09-06 MED ORDER — PANTOPRAZOLE SODIUM 40 MG PO TBEC
40.0000 mg | DELAYED_RELEASE_TABLET | Freq: Every day | ORAL | Status: DC
  Administered 2015-09-07: 40 mg via ORAL
  Filled 2015-09-06: qty 1

## 2015-09-06 MED ORDER — PEG-KCL-NACL-NASULF-NA ASC-C 100 G PO SOLR: 0.5000 | Freq: Once | ORAL | Status: DC

## 2015-09-06 MED ORDER — PEG-KCL-NACL-NASULF-NA ASC-C 100 G PO SOLR: 1.0000 | Freq: Once | ORAL | Status: DC

## 2015-09-06 NOTE — Progress Notes (Signed)
[facility] PROGRESS NOTE  [name] PVV:[id number] DOB: 30-May-1933 DOA: 09/05/2015 PCP: Eliezer Lofts, MD  Assessment/Plan:  Principal Problem:   CAP (community acquired pneumonia): symptoms rather mild, but will continue CAP rx.  PT eval Active Problems:   Anemia, acute on chronic. Improved after 2 units prbc.  Will proceed with IV iron per gi recs. Patient and family deciding on endoscopy   Diabetes mellitus with neurological manifestation ([place]), controlled   Hypothyroidism   High cholesterol   HTN (hypertension): BP high on metoprolol. Resume ARB. Hold maxide for now   CAD (coronary artery disease) stable   Renal insufficiency  Code Status:  full Family Communication:  Multiple at bedside Disposition Plan:  Home 1-2 days if stable  HPI/Subjective: Hungry. Mild dry cough. No dyspnea. Usually walks with walker  Objective: Filed Vitals:   09/06/15 0920 09/06/15 1124  BP: 178/62 184/63  Pulse: 70 67  Temp: 97.1 F (36.2 C) 98.5 F (36.9 C)  Resp: 18 17    Intake/Output Summary (Last 24 hours) at 09/06/15 1225 Last data filed at 09/06/15 1132  Gross per 24 hour  Intake 1921.67 ml  Output    525 ml  Net 1396.67 ml   Filed Weights   09/05/15 1255 09/05/15 1733 09/06/15 0544  Weight: 88.361 kg (194 lb 12.8 oz) 86.864 kg (191 lb 8 oz) 87.3 kg (192 lb 7.4 oz)    Exam:   General:  A and o. Comfortable. No cough  Cardiovascular: RRR without WRR  Respiratory: S, NT, ND  Abdomen: s, nt, nd  Ext: no CCE  Basic Metabolic Panel:  Recent Labs Lab 09/05/15 1254 09/06/15 0348  NA 137 137  K 4.1 3.5  CL 105 106  CO2 23 22  GLUCOSE 182* 80  BUN 16 17  CREATININE 1.19* 1.16*  CALCIUM 9.2 8.8*   Liver Function Tests: No results for input(s): AST, ALT, ALKPHOS, BILITOT, PROT, ALBUMIN in the last 168 hours. No results for input(s): LIPASE, AMYLASE in the last 168 hours. No results for input(s): AMMONIA in the last 168 hours. CBC:  Recent  Labs Lab 09/05/15 1254 09/06/15 0348  WBC 6.9 7.5  HGB 6.4* 8.1*  HCT 21.4* 25.8*  MCV 85.6 82.7  PLT 165 151   Cardiac Enzymes: No results for input(s): CKTOTAL, CKMB, CKMBINDEX, TROPONINI in the last 168 hours. BNP (last 3 results) No results for input(s): BNP in the last 8760 hours.  ProBNP (last 3 results) No results for input(s): PROBNP in the last 8760 hours.  CBG:  Recent Labs Lab 09/05/15 1750 09/05/15 2135 09/06/15 0552 09/06/15 1129  GLUCAP 127* 123* 82 108*    No results found for this or any previous visit (from the past 240 hour(s)).   Studies: Dg Chest 2 View  09/05/2015  CLINICAL DATA:  Shortness of breath for 1 week EXAM: CHEST  2 VIEW COMPARISON:  October 11, 2014 FINDINGS: There is consolidation in the lateral and posterior left base regions with small left effusion. Lungs elsewhere clear. Heart is mildly enlarged with pulmonary vascularity within normal limits. Patient is status post coronary artery bypass grafting. No adenopathy. There is a hiatal hernia present. There is atherosclerotic change throughout the aorta. No bone lesions. IMPRESSION: Infiltrate left base with small left effusion. Moderate hiatal hernia, stable. Stable cardiomegaly. Followup PA and lateral chest radiographs recommended in 3-4 weeks following trial of antibiotic therapy to ensure resolution and exclude underlying malignancy. Electronically Signed   By: Lowella Grip III  M.D.   On: 09/05/2015 13:41    Scheduled Meds: . azithromycin  500 mg Oral Q24H  . bisacodyl  5 mg Oral Q6H  . cefTRIAXone (ROCEPHIN)  IV  1 g Intravenous Q24H  . insulin aspart  0-15 Units Subcutaneous TID WC  . insulin aspart  0-5 Units Subcutaneous QHS  . losartan  50 mg Oral Daily  . metoprolol succinate  25 mg Oral BID  . pantoprazole (PROTONIX) IV  40 mg Intravenous QHS  . peg 3350 powder  0.5 kit Oral Once   And  . [START ON 09/07/2015] peg 3350 powder  0.5 kit Oral Once  . sertraline  25 mg Oral  Daily  . sodium chloride  3 mL Intravenous Q12H   Continuous Infusions:   Time spent: 35 minutes  [facility]  [email or web address], password [facility] 09/06/2015, 12:25 PM  LOS: 1 day

## 2015-09-07 ENCOUNTER — Encounter (HOSPITAL_COMMUNITY)
Admission: EM | Disposition: A | Discharge status: Home / Self Care | Payer: Self-pay | Source: Home / Self Care | Attending: Internal Medicine

## 2015-09-07 DIAGNOSIS — J189 Pneumonia, unspecified organism: Secondary | ICD-10-CM | POA: Diagnosis not present

## 2015-09-07 LAB — GLUCOSE, CAPILLARY
Glucose-Capillary: 110 mg/dL — ABNORMAL HIGH (ref 65–99)
Glucose-Capillary: 150 mg/dL — ABNORMAL HIGH (ref 65–99)

## 2015-09-07 SURGERY — COLONOSCOPY
Anesthesia: Moderate Sedation

## 2015-09-07 MED ORDER — LEVOFLOXACIN 500 MG PO TABS: 500.0000 mg | ORAL_TABLET | Freq: Every day | ORAL | Status: DC

## 2015-09-07 MED ORDER — TRIAMTERENE-HCTZ 37.5-25 MG PO TABS
1.0000 | ORAL_TABLET | Freq: Every day | ORAL | Status: DC
  Administered 2015-09-07: 1 via ORAL
  Filled 2015-09-07: qty 1

## 2015-09-07 NOTE — Progress Notes (Signed)
Pt d/c'd to home with family. D/c instructions given to and d/w pt and daughter. They verbalize understanding.

## 2015-09-07 NOTE — Discharge Summary (Signed)
Physician Discharge Summary  DEVYNNE RAMBERT [id number] DOB: 09-May-1933 DOA: 09/05/2015  PCP: Eliezer Lofts, MD  Admit date: 09/05/2015 Discharge date: 09/07/2015  Time spent: greater than 30 minutes  Recommendations for Outpatient Follow-up:  1. Monitor h/h 2. Consider outpatient EGD, enteroscopy, colonoscopy 3. F/u hematology per family request   Discharge Diagnoses:  Principal Problem:   CAP (community acquired pneumonia) Active Problems:   Diabetes mellitus with neurological manifestation (HCC)   Hypothyroidism   High cholesterol   HTN (hypertension)   Iron deficiency anemia   CAD (coronary artery disease)   Renal insufficiency   Anemia   Dyspnea   Discharge Condition: stable  Diet recommendation: carb modified, heart healthy  Filed Weights   09/05/15 1733 09/06/15 0544 09/07/15 0505  Weight: 86.864 kg (191 lb 8 oz) 87.3 kg (192 lb 7.4 oz) 86.637 kg (191 lb)    History of present illness:  81 y.o. female past medical history includes ischemic heart disease status post CABG [place] acute MI 2008, DOE, aortic stenosis, diabetes hypertension presents to the emergency department with the chief complaint of gradual worsening and persistent shortness of breath and generalized weakness. Initial evaluation in the emergency department reveals hemoglobin of 6.4 chest x-ray concerning for community-acquired pneumonia.  Information obtained from the patient he reports she was in her usual state of health until about 3 weeks ago when she developed shortness of breath that she attributed to an allergic reaction to sunflower she received. However this shortness of breath persisted and worsened. Associated symptoms include intermittent nonproductive cough generalized weakness. She denies headache visual disturbances dizziness syncope or near-syncope. She denies any abdominal pain nausea vomiting constipation diarrhea. She denies fever chills or recent sick contacts. She denies any  melena or bright red blood per rectum. She does report being transfused in the past for low hemoglobin believe she had a colonoscopy about 5 years ago.  The emergency department she is afebrile hemodynamically stable and not hypoxic.   Hospital Course:  GI was contacted by EDP for consult.  Patient admitted to [facility]. Started on coverage for CAP. Vital signs remain stable.  Will need repeat CXR in 4-6 weeks to ensure resolution of infiltrate. Transfused 2 units prbc.  Stool heme negative. GI recommended EGD, enteroscopy and colonoscopy, but patient declined. She will think about it and consider outpatient.  Patient would like to f/u with Dr. Fuller Plan and hematology and phone numbers for both given.  By discharge, feeling much better, requesting discharge, dyspnea improved. At discharge, hgb 8.1.  Given IV iron while here.  Procedures:  none  Consultations:  [facility]  Discharge Exam: Filed Vitals:   09/07/15 0505 09/07/15 0952  BP: 159/42 174/56  Pulse: 63 64  Temp: 98.6 F (37 C)   Resp:      General: comfortable  Cardiovascular: RRR Respiratory: CTA  Discharge Instructions    Current Discharge Medication List    START taking these medications   Details  levofloxacin (LEVAQUIN) 500 MG tablet Take 1 tablet (500 mg total) by mouth daily. Qty: 4 tablet, Refills: 0      CONTINUE these medications which have NOT CHANGED   Details  aspirin 81 MG tablet Take 81 mg by mouth daily.      atorvastatin (LIPITOR) 40 MG tablet Take 1 tablet (40 mg total) by mouth daily. Qty: 90 tablet, Refills: 1    cyanocobalamin 1000 MCG tablet Take 100 mcg by mouth once a week.     Ferrous Sulfate (IRON) 325 (  65 FE) MG TABS Take 1 tablet by mouth 2 (two) times daily.     gabapentin (NEURONTIN) 100 MG capsule Take 100 mg by mouth 3 (three) times daily.    glipiZIDE (GLUCOTROL XL) 10 MG 24 hr tablet TAKE 1 TABLET BY MOUTH EVERY DAY Qty: 30 tablet, Refills: 5    levothyroxine (SYNTHROID,  LEVOTHROID) 50 MCG tablet TAKE 1 TABLET BY MOUTH EVERY DAY Qty: 30 tablet, Refills: 10    losartan (COZAAR) 50 MG tablet Take 1 tablet (50 mg total) by mouth daily. Qty: 90 tablet, Refills: 1    metoprolol succinate (TOPROL-XL) 25 MG 24 hr tablet Take 1 tablet (25 mg total) by mouth 2 (two) times daily. Qty: 180 tablet, Refills: 1    nitroGLYCERIN (NITROSTAT) 0.4 MG SL tablet Place 1 tablet (0.4 mg total) under the tongue every 5 (five) minutes as needed for chest pain (x 3 tabs daily). Qty: 25 tablet, Refills: 3    pioglitazone (ACTOS) 30 MG tablet Take 30 mg by mouth daily.    sertraline (ZOLOFT) 25 MG tablet TAKE 1 TABLET (25 MG TOTAL) BY MOUTH DAILY. Qty: 30 tablet, Refills: 5    triamterene-hydrochlorothiazide (DYAZIDE) 37.5-25 MG capsule Take 1 each (1 capsule total) by mouth daily. Qty: 90 capsule, Refills: 1       Allergies  Allergen Reactions  . Morphine And Related Other (See Comments)    BAD HEADACHE   Follow-up Information    Follow up with Eliezer Lofts, MD.   Specialty:  Family Medicine   Why:  1-2 weeks to check hemoglobin. need repeat chest xray in 4-6 weeks to ensure resolution   Contact information:   [place] [address] [phone]       Follow up with [facility].   Why:  to follow up anemia. [phone]      Follow up with Norberto Sorenson T. Fuller Plan, MD.   Specialty:  Gastroenterology   Why:  to consider elective endoscopy   Contact information:   [address] [address] [phone]        The results of significant diagnostics from this hospitalization (including imaging, microbiology, ancillary and laboratory) are listed below for reference.    Significant Diagnostic Studies: Dg Chest 2 View  09/05/2015  CLINICAL DATA:  Shortness of breath for 1 week EXAM: CHEST  2 VIEW COMPARISON:  October 11, 2014 FINDINGS: There is consolidation in the lateral and posterior left base regions with small left  effusion. Lungs elsewhere clear. Heart is mildly enlarged with pulmonary vascularity within normal limits. Patient is status post coronary artery bypass grafting. No adenopathy. There is a hiatal hernia present. There is atherosclerotic change throughout the aorta. No bone lesions. IMPRESSION: Infiltrate left base with small left effusion. Moderate hiatal hernia, stable. Stable cardiomegaly. Followup PA and lateral chest radiographs recommended in 3-4 weeks following trial of antibiotic therapy to ensure resolution and exclude underlying malignancy. Electronically Signed   By: Lowella Grip III M.D.   On: 09/05/2015 13:41    Microbiology: No results found for this or any previous visit (from the past 240 hour(s)).   Labs: Basic Metabolic Panel:  Recent Labs Lab 09/05/15 1254 09/06/15 0348  NA 137 137  K 4.1 3.5  CL 105 106  CO2 23 22  GLUCOSE 182* 80  BUN 16 17  CREATININE 1.19* 1.16*  CALCIUM 9.2 8.8*   Liver Function Tests: No results for input(s): AST, ALT, ALKPHOS, BILITOT, PROT,  ALBUMIN in the last 168 hours. No results for input(s): LIPASE, AMYLASE in the last 168 hours. No results for input(s): AMMONIA in the last 168 hours. CBC:  Recent Labs Lab 09/05/15 1254 09/06/15 0348  WBC 6.9 7.5  HGB 6.4* 8.1*  HCT 21.4* 25.8*  MCV 85.6 82.7  PLT 165 151   Cardiac Enzymes: No results for input(s): CKTOTAL, CKMB, CKMBINDEX, TROPONINI in the last 168 hours. BNP: BNP (last 3 results) No results for input(s): BNP in the last 8760 hours.  ProBNP (last 3 results) No results for input(s): PROBNP in the last 8760 hours.  CBG:  Recent Labs Lab 09/06/15 0552 09/06/15 1129 09/06/15 1707 09/06/15 2127 09/07/15 0601  GLUCAP 82 108* 111* 101* 110*       Signed:  Traevon Meiring L  [facility] 09/07/2015, 10:43 AM

## 2015-09-07 NOTE — Evaluation (Signed)
Physical Therapy Evaluation Patient Details Name: [name] MRN: [id number] DOB: 1933-02-27 Today's Date: 09/07/2015   History of Present Illness  Patient admitted with increased SOB - workup revealed CAP.  PMH significant for DM, anemia and CAD.  Clinical Impression  Patient demonstrated safety with mobility, including gait and transfers.  Patient with some mild unsteadiness and thus recommend patient use cane during ambulation for next several days.  Feel patient safe to return home alone.  Do not feel she needs any follow up PT.  Will sign off.     Follow Up Recommendations No PT follow up    Equipment Recommendations  None recommended by PT    Recommendations for Other Services       Precautions / Restrictions Precautions Precautions: None Restrictions Weight Bearing Restrictions: No      Mobility  Bed Mobility Overal bed mobility: Modified Independent             General bed mobility comments: used railing for OOB  Transfers Overall transfer level: Modified independent Equipment used: None Transfers: Sit to/from Stand Sit to Stand: Min guard         General transfer comment: lost balance upon first standing, but otherwise, required no assistance  Ambulation/Gait Ambulation/Gait assistance: Modified independent (Device/Increase time) Ambulation Distance (Feet): 80 Feet Assistive device: None Gait Pattern/deviations: WFL(Within Functional Limits) Gait velocity: decreased - did not measure.   General Gait Details: ambulated at varying speed, quick stops and with perterbations with no loss of balance.  Ambulated with decreased speed.  Stairs            Wheelchair Mobility    Modified Rankin (Stroke Patients Only)       Balance Overall balance assessment: Needs assistance Sitting-balance support: No upper extremity supported Sitting balance-Leahy Scale: Good     Standing balance support: No upper extremity supported Standing  balance-Leahy Scale: Fair Standing balance comment: able to accept some min challenges in standing.                             Pertinent Vitals/Pain Pain Assessment: No/denies pain    Home Living Family/patient expects to be discharged to:: Private residence Living Arrangements: Alone Available Help at Discharge: Friend(s);Family Type of Home: Apartment Home Access: Elevator     Home Layout: One level Home Equipment: Cane - quad      Prior Function Level of Independence: Independent with assistive device(s)               Hand Dominance        Extremity/Trunk Assessment   Upper Extremity Assessment: Overall WFL for tasks assessed           Lower Extremity Assessment: Overall WFL for tasks assessed      Cervical / Trunk Assessment: Normal  Communication   Communication: No difficulties  Cognition Arousal/Alertness: Awake/alert Behavior During Therapy: WFL for tasks assessed/performed Overall Cognitive Status: Within Functional Limits for tasks assessed                      General Comments      Exercises        Assessment/Plan    PT Assessment Patent does not need any further PT services  PT Diagnosis Generalized weakness   PT Problem List    PT Treatment Interventions     PT Goals (Current goals can be found in the Care Plan section) Acute Rehab PT  Goals Patient Stated Goal: go home PT Goal Formulation: All assessment and education complete, DC therapy    Frequency     Barriers to discharge        Co-evaluation               End of Session Equipment Utilized During Treatment: Gait belt Activity Tolerance: Patient tolerated treatment well Patient left: in bed;with family/visitor present      Functional Assessment Tool Used: clinical observation Functional Limitation: Mobility: Walking and moving around Mobility: Walking and Moving Around Current Status [id number]): At least 1 percent but less than 20 percent  impaired, limited or restricted Mobility: Walking and Moving Around Goal Status [phone]): At least 1 percent but less than 20 percent impaired, limited or restricted Mobility: Walking and Moving Around Discharge Status [phone]): At least 1 percent but less than 20 percent impaired, limited or restricted    Time: 1024-1035 PT Time Calculation (min) (ACUTE ONLY): 11 min   Charges:   PT Evaluation $Initial PT Evaluation Tier I: 1 Procedure     PT G Codes:   PT G-Codes **NOT FOR INPATIENT CLASS** Functional Assessment Tool Used: clinical observation Functional Limitation: Mobility: Walking and moving around Mobility: Walking and Moving Around Current Status [id number]): At least 1 percent but less than 20 percent impaired, limited or restricted Mobility: Walking and Moving Around Goal Status [phone]): At least 1 percent but less than 20 percent impaired, limited or restricted Mobility: Walking and Moving Around Discharge Status [phone]): At least 1 percent but less than 20 percent impaired, limited or restricted    Shanna Cisco 09/07/2015, 10:42 AM  09/07/2015 [place], [place]

## 2015-09-08 LAB — LEGIONELLA ANTIGEN, URINE

## 2015-09-09 ENCOUNTER — Telehealth: Payer: Self-pay

## 2015-09-09 MED ORDER — ONDANSETRON 4 MG PO TBDP: 4.0000 mg | ORAL_TABLET | Freq: Every day | ORAL | Status: DC

## 2015-09-09 NOTE — Telephone Encounter (Signed)
Tami pts daugher DPR signed said pt was discharged from [facility] 09/07/15. Pt was nauseated and vomiting on 09/04/15 before admission on 09/05/15. While in hospital pt was not N&V. When pt discharged on 09/07/15 pt restarted taking her regular meds and Sun night had N&V. Since 09/07/15 pt has not vomited by been extremely nauseated with dry heaves at times. This morning pt ate toast and banana and has not vomited and has not taken any meds today. Tami is holding pts meds until gets call from Dr Rometta Emery CMA with what med pt should take. Tami said only new med is abx and pt was N&V before starting the abx in the hospital. Tami request cb.

## 2015-09-09 NOTE — Telephone Encounter (Signed)
Spoke with Tammy.  Ms. Delice Lesch other daughter came over with a blood pressure cuff.  Her BP was 162/84 pulse 72.  They did have her take her antibiotic and her thyroid medication after we spoke earlier and within an hour from taking it, she started to feel nauseated again.  Per Dr. Diona Browner, have patient try to take the thyroid medication with the zofran to see if that helps any.  Will hold other medications until her appointment on 09/11/15.  Advised to continue to monitor [name]'s BP and to call us if it starts running high.  Tami states understanding.

## 2015-09-09 NOTE — Telephone Encounter (Signed)
Need to have BP checked. Have her get cuff to check at home or at least start back  BP meds if nausea resolved. Blood sugar can wiat until Thursday appt.

## 2015-09-09 NOTE — Telephone Encounter (Signed)
Spoke with Tami.  She states she has no idea what Ms. Meshell' BP and blood sugars have been running.  She states that today she is doing okay.  Will continue Antibiotics and start giving her the thyroid medication.  She did want the Rx for Zofran to have on hand if needed.  Rx sent to [place].

## 2015-09-09 NOTE — Telephone Encounter (Signed)
Transition Care Management Follow-up Telephone Call   Date discharged?09/07/15   How have you been since you were released from the hospital? Tami pts daughter said pt had N&V on 09/07/15 and since no vomiting but extremely nauseated and dry heaves at times. This AM pt was able to keep down toast and banana.  Do you understand why you were in the hospital? yes, pneumonia and anemia  Do you understand the discharge instructions? yes   Where were you discharged to? home   Items Reviewed:  Medications reviewed: yes  Allergies reviewed: yes  Dietary changes reviewed: no  Referrals reviewed: N/A   Functional Questionnaire:   Activities of Daily Living (ADLs):   She states they are independent in the following: ambulation, bathing and hygiene, feeding, continence, grooming, toileting and dressing States they require assistance with the following: none   Any transportation issues/concerns?: no   Any patient concerns? yes, pt concerned still nauseated and wants to know if should continue taking meds; (separate phone note sent to Dr Diona Browner)   Confirmed importance and date/time of follow-up visits scheduled yes f/u appt 09/11/15 at 10:30 AM.  Provider Appointment booked with Dr Eliezer Lofts  Confirmed with patient if condition begins to worsen call PCP or go to the ER.  Patient was given the office number and encouraged to call back with question or concerns.  : yes

## 2015-09-09 NOTE — Telephone Encounter (Signed)
Please call pt.. What is BP  And CBGs measuring off her meds?  She can hold iron, gabapentin, lipitor and B12.   She absolutely needs her thyroid medicine.  Depending on her BP and blood sugar... I will let her know about restarting the other meds.   Call if rx for zofran 4 mg daily #30 0RF for nausea if interested

## 2015-09-11 ENCOUNTER — Encounter: Payer: Self-pay | Admitting: Family Medicine

## 2015-09-11 ENCOUNTER — Telehealth: Payer: Self-pay | Admitting: Family Medicine

## 2015-09-11 ENCOUNTER — Ambulatory Visit (INDEPENDENT_AMBULATORY_CARE_PROVIDER_SITE_OTHER): Payer: Medicare Other | Admitting: Family Medicine

## 2015-09-11 VITALS — BP 199/86 | HR 82 | Temp 98.1°F | Ht 62.5 in | Wt 185.5 lb

## 2015-09-11 DIAGNOSIS — I10 Essential (primary) hypertension: Secondary | ICD-10-CM

## 2015-09-11 DIAGNOSIS — J189 Pneumonia, unspecified organism: Secondary | ICD-10-CM

## 2015-09-11 DIAGNOSIS — D509 Iron deficiency anemia, unspecified: Secondary | ICD-10-CM | POA: Diagnosis not present

## 2015-09-11 DIAGNOSIS — E1140 Type 2 diabetes mellitus with diabetic neuropathy, unspecified: Secondary | ICD-10-CM

## 2015-09-11 LAB — CBC WITH DIFFERENTIAL/PLATELET
BASOS ABS: 0.1 10*3/uL (ref 0.0–0.1)
Basophils Relative: 0.7 % (ref 0.0–3.0)
Eosinophils Absolute: 0.2 10*3/uL (ref 0.0–0.7)
Eosinophils Relative: 2.4 % (ref 0.0–5.0)
HCT: 34.2 % — ABNORMAL LOW (ref 36.0–46.0)
HEMOGLOBIN: 10.6 g/dL — AB (ref 12.0–15.0)
LYMPHS ABS: 1.1 10*3/uL (ref 0.7–4.0)
LYMPHS PCT: 13.7 % (ref 12.0–46.0)
MCHC: 31.1 g/dL (ref 30.0–36.0)
MCV: 82.7 fl (ref 78.0–100.0)
MONOS PCT: 8.1 % (ref 3.0–12.0)
Monocytes Absolute: 0.7 10*3/uL (ref 0.1–1.0)
NEUTROS PCT: 75.1 % (ref 43.0–77.0)
Neutro Abs: 6.2 10*3/uL (ref 1.4–7.7)
Platelets: 209 10*3/uL (ref 150.0–400.0)
RBC: 4.13 Mil/uL (ref 3.87–5.11)
RDW: 19.8 % — ABNORMAL HIGH (ref 11.5–15.5)
WBC: 8.2 10*3/uL (ref 4.0–10.5)

## 2015-09-11 LAB — BASIC METABOLIC PANEL
BUN: 14 mg/dL (ref 6–23)
CHLORIDE: 101 meq/L (ref 96–112)
CO2: 27 meq/L (ref 19–32)
CREATININE: 1.3 mg/dL — AB (ref 0.40–1.20)
Calcium: 9.3 mg/dL (ref 8.4–10.5)
GFR: 41.85 mL/min — ABNORMAL LOW (ref >60.00)
Glucose, Bld: 274 mg/dL — ABNORMAL HIGH (ref 70–99)
Potassium: 4 mEq/L (ref 3.5–5.1)
Sodium: 137 mEq/L (ref 135–145)

## 2015-09-11 MED ORDER — DOXYCYCLINE HYCLATE 100 MG PO TABS: 100.0000 mg | ORAL_TABLET | Freq: Two times a day (BID) | ORAL | Status: DC

## 2015-09-11 NOTE — Telephone Encounter (Signed)
I though we discussed this at [place] in detail. She needs to take thyroid and BP meds but can wait until tommorow or the next day to restart depending on nausea. Start new antibiotic doxy tonight (no levaquin) Can use zofran for nausea.

## 2015-09-11 NOTE — Progress Notes (Signed)
Pre visit review using our clinic review tool, if applicable. No additional management support is needed unless otherwise documented below in the visit note. 

## 2015-09-11 NOTE — Patient Instructions (Addendum)
Change antibiotics to doxy x 3 more days. Stop levaquin as may be cause of nausea. Try to encourage fluids.. Ginger-ale, soup, broth, popsicles, ice chips etc.  Stop at lab on way out.  Follow up early next week, Tuesday.  Return in 4-6 weeks  For repeat X-ray.  Check fasting blood sugar daily in AM.. Goal < 120. Call if > 200. Call to set up appt with Dr. Rolla Flatten, [facility]. To set up appt in next month. Refer to heme.

## 2015-09-11 NOTE — Telephone Encounter (Signed)
Daughter came in wanting to know since mom is nasueated, which medicines she must take and which ones she can wait to take until she feels better? cb number is [phone] Thank you

## 2015-09-11 NOTE — Progress Notes (Signed)
Subjective:    Patient ID: [name], female    DOB: [birth year], 81 y.o.   MRN: [id number]  HPI  81 year old female with DM, HTN  Presents for hospital follow up for CA PNA.  Admit date: 09/05/2015 Discharge date: 09/07/2015  Recommendations for Outpatient Follow-up:  1. Monitor h/h 2. Consider outpatient EGD, enteroscopy, colonoscopy 3. F/u hematology per family request  History of present illness:  81 y.o. female past medical history includes ischemic heart disease status post CABG [place] acute MI 2008, DOE, aortic stenosis, diabetes hypertension presents to the emergency department with the chief complaint of gradual worsening and persistent shortness of breath and generalized weakness. Initial evaluation in the emergency department reveals hemoglobin of 6.4  and chest x-ray concerning for community-acquired pneumonia.  Information obtained from the patient  reports she was in her usual state of health until about 3 weeks ago when she developed shortness of breath that she attributed to an allergic reaction to sunflower she received. However this shortness of breath persisted and worsened. Associated symptoms include intermittent nonproductive cough generalized weakness. She denies fever chills or recent sick contacts. She denies any melena or bright red blood per rectum. She does report being transfused in the past for low hemoglobin believe she had a colonoscopy about 5 years ago.  Hospital Course:  GI was contacted by EDP for consult. Patient admitted to [facility]. Started on coverage for CAP. Vital signs remain stable. Will need repeat CXR in 4-6 weeks to ensure resolution of infiltrate. Transfused 2 units prbc. Stool heme negative. GI recommended EGD, enteroscopy and colonoscopy, but patient declined. She will think about it and consider outpatient. Patient would like to f/u with Dr. Fuller Plan and hematology and phone numbers for both given. By discharge, feeling much  better, requesting discharge, dyspnea improved. At discharge, hgb 8.1. Given IV iron while here. Told to take 4 more days of levofloxacin.  Since discharge she has not been taking her  regular medicaitons because when she does she has emesis. She was given zofran over the phone. She has been able to eat some yesterday, but still dry heaving last night.  She has only been keeping down 12 oz per day.  She has been able to tolerate her antibiotics.  Shortness of breath is much better.  No cough. No fever.  CBGs not checking at home.  BP Readings from Last 3 Encounters:  09/11/15 199/86  09/07/15 174/56  08/28/15 150/82      Review of Systems  Constitutional: Positive for fatigue. Negative for fever.  HENT: Negative for ear pain.   Eyes: Negative for pain.  Respiratory: Positive for cough and shortness of breath.   Cardiovascular: Negative for chest pain and leg swelling.  Gastrointestinal: Negative for abdominal pain.       Objective:   Physical Exam  Constitutional: Vital signs are normal. She appears well-developed and well-nourished. She appears lethargic. She is cooperative.  Non-toxic appearance. She appears ill. No distress.  HENT:  Head: Normocephalic.  Right Ear: Hearing, tympanic membrane, external ear and ear canal normal. Tympanic membrane is not erythematous, not retracted and not bulging.  Left Ear: Hearing, tympanic membrane, external ear and ear canal normal. Tympanic membrane is not erythematous, not retracted and not bulging.  Nose: No mucosal edema or rhinorrhea. Right sinus exhibits no maxillary sinus tenderness and no frontal sinus tenderness. Left sinus exhibits no maxillary sinus tenderness and no frontal sinus tenderness.  Mouth/Throat: Uvula is midline, oropharynx is  clear and moist and mucous membranes are normal.  Eyes: Conjunctivae, EOM and lids are normal. Pupils are equal, round, and reactive to light. Lids are everted and swept, no foreign bodies  found.  Neck: Trachea normal and normal range of motion. Neck supple. Carotid bruit is not present. No thyroid mass and no thyromegaly present.  Cardiovascular: Normal rate, regular rhythm, S1 normal, S2 normal, normal heart sounds, intact distal pulses and normal pulses.  Exam reveals no gallop and no friction rub.   No murmur heard. Pulmonary/Chest: Effort normal and breath sounds normal. No tachypnea. No respiratory distress. She has no decreased breath sounds. She has no wheezes. She has no rhonchi. She has no rales.  Abdominal: Soft. Normal appearance and bowel sounds are normal. There is no tenderness.  Neurological: She appears lethargic.  Skin: Skin is warm, dry and intact. No rash noted.  Psychiatric: Her speech is normal and behavior is normal. Judgment and thought content normal. Her mood appears not anxious. Cognition and memory are normal. She does not exhibit a depressed mood.          Assessment & Plan:

## 2015-09-11 NOTE — Telephone Encounter (Addendum)
[name] notified as instructed by telephone.  She states they have started the Doxycycline and she did give her the Toprol.  BP reading after medication was 149/74.  So far [name] has not had any more nausea.  Will follow up next week as scheduled.

## 2015-09-15 ENCOUNTER — Emergency Department (HOSPITAL_COMMUNITY): Payer: Medicare Other

## 2015-09-15 ENCOUNTER — Encounter (HOSPITAL_COMMUNITY): Payer: Self-pay | Admitting: Family Medicine

## 2015-09-15 ENCOUNTER — Other Ambulatory Visit: Payer: Self-pay | Admitting: Family Medicine

## 2015-09-15 ENCOUNTER — Inpatient Hospital Stay (HOSPITAL_COMMUNITY)
Admission: EM | Admit: 2015-09-15 | Discharge: 2015-09-19 | DRG: 070 | Disposition: A | Discharge status: Home Health | Payer: Medicare Other | Attending: Internal Medicine | Admitting: Internal Medicine

## 2015-09-15 ENCOUNTER — Inpatient Hospital Stay (HOSPITAL_COMMUNITY): Payer: Medicare Other

## 2015-09-15 ENCOUNTER — Telehealth: Payer: Self-pay | Admitting: Family Medicine

## 2015-09-15 DIAGNOSIS — E860 Dehydration: Secondary | ICD-10-CM | POA: Diagnosis present

## 2015-09-15 DIAGNOSIS — E1122 Type 2 diabetes mellitus with diabetic chronic kidney disease: Secondary | ICD-10-CM | POA: Diagnosis present

## 2015-09-15 DIAGNOSIS — Z6834 Body mass index (BMI) 34.0-34.9, adult: Secondary | ICD-10-CM

## 2015-09-15 DIAGNOSIS — J189 Pneumonia, unspecified organism: Secondary | ICD-10-CM | POA: Diagnosis present

## 2015-09-15 DIAGNOSIS — R110 Nausea: Secondary | ICD-10-CM | POA: Diagnosis not present

## 2015-09-15 DIAGNOSIS — R4701 Aphasia: Secondary | ICD-10-CM | POA: Diagnosis not present

## 2015-09-15 DIAGNOSIS — E039 Hypothyroidism, unspecified: Secondary | ICD-10-CM | POA: Diagnosis present

## 2015-09-15 DIAGNOSIS — N183 Chronic kidney disease, stage 3 (moderate): Secondary | ICD-10-CM | POA: Diagnosis not present

## 2015-09-15 DIAGNOSIS — J9811 Atelectasis: Secondary | ICD-10-CM | POA: Diagnosis not present

## 2015-09-15 DIAGNOSIS — I160 Hypertensive urgency: Secondary | ICD-10-CM | POA: Diagnosis present

## 2015-09-15 DIAGNOSIS — R410 Disorientation, unspecified: Secondary | ICD-10-CM | POA: Diagnosis not present

## 2015-09-15 DIAGNOSIS — Z9221 Personal history of antineoplastic chemotherapy: Secondary | ICD-10-CM | POA: Diagnosis not present

## 2015-09-15 DIAGNOSIS — E872 Acidosis: Secondary | ICD-10-CM | POA: Diagnosis not present

## 2015-09-15 DIAGNOSIS — I6523 Occlusion and stenosis of bilateral carotid arteries: Secondary | ICD-10-CM | POA: Diagnosis not present

## 2015-09-15 DIAGNOSIS — T380X5A Adverse effect of glucocorticoids and synthetic analogues, initial encounter: Secondary | ICD-10-CM | POA: Diagnosis not present

## 2015-09-15 DIAGNOSIS — Z9011 Acquired absence of right breast and nipple: Secondary | ICD-10-CM

## 2015-09-15 DIAGNOSIS — I129 Hypertensive chronic kidney disease with stage 1 through stage 4 chronic kidney disease, or unspecified chronic kidney disease: Secondary | ICD-10-CM | POA: Diagnosis not present

## 2015-09-15 DIAGNOSIS — R4182 Altered mental status, unspecified: Secondary | ICD-10-CM | POA: Diagnosis not present

## 2015-09-15 DIAGNOSIS — Z885 Allergy status to narcotic agent status: Secondary | ICD-10-CM | POA: Diagnosis not present

## 2015-09-15 DIAGNOSIS — E0840 Diabetes mellitus due to underlying condition with diabetic neuropathy, unspecified: Secondary | ICD-10-CM | POA: Diagnosis not present

## 2015-09-15 DIAGNOSIS — Z833 Family history of diabetes mellitus: Secondary | ICD-10-CM

## 2015-09-15 DIAGNOSIS — Z853 Personal history of malignant neoplasm of breast: Secondary | ICD-10-CM

## 2015-09-15 DIAGNOSIS — G9341 Metabolic encephalopathy: Principal | ICD-10-CM | POA: Diagnosis present

## 2015-09-15 DIAGNOSIS — Z951 Presence of aortocoronary bypass graft: Secondary | ICD-10-CM | POA: Diagnosis not present

## 2015-09-15 DIAGNOSIS — J90 Pleural effusion, not elsewhere classified: Secondary | ICD-10-CM | POA: Diagnosis present

## 2015-09-15 DIAGNOSIS — E1165 Type 2 diabetes mellitus with hyperglycemia: Secondary | ICD-10-CM | POA: Diagnosis not present

## 2015-09-15 DIAGNOSIS — Z8542 Personal history of malignant neoplasm of other parts of uterus: Secondary | ICD-10-CM | POA: Diagnosis not present

## 2015-09-15 DIAGNOSIS — Z7982 Long term (current) use of aspirin: Secondary | ICD-10-CM

## 2015-09-15 DIAGNOSIS — D509 Iron deficiency anemia, unspecified: Secondary | ICD-10-CM | POA: Diagnosis present

## 2015-09-15 DIAGNOSIS — H8110 Benign paroxysmal vertigo, unspecified ear: Secondary | ICD-10-CM | POA: Diagnosis not present

## 2015-09-15 DIAGNOSIS — I10 Essential (primary) hypertension: Secondary | ICD-10-CM

## 2015-09-15 DIAGNOSIS — E785 Hyperlipidemia, unspecified: Secondary | ICD-10-CM | POA: Diagnosis present

## 2015-09-15 DIAGNOSIS — I6789 Other cerebrovascular disease: Secondary | ICD-10-CM | POA: Diagnosis not present

## 2015-09-15 DIAGNOSIS — N189 Chronic kidney disease, unspecified: Secondary | ICD-10-CM

## 2015-09-15 DIAGNOSIS — Z8249 Family history of ischemic heart disease and other diseases of the circulatory system: Secondary | ICD-10-CM | POA: Diagnosis not present

## 2015-09-15 DIAGNOSIS — I2510 Atherosclerotic heart disease of native coronary artery without angina pectoris: Secondary | ICD-10-CM | POA: Diagnosis present

## 2015-09-15 DIAGNOSIS — N179 Acute kidney failure, unspecified: Secondary | ICD-10-CM | POA: Diagnosis present

## 2015-09-15 DIAGNOSIS — Z96641 Presence of right artificial hip joint: Secondary | ICD-10-CM | POA: Diagnosis present

## 2015-09-15 DIAGNOSIS — G9340 Encephalopathy, unspecified: Secondary | ICD-10-CM

## 2015-09-15 DIAGNOSIS — Z7984 Long term (current) use of oral hypoglycemic drugs: Secondary | ICD-10-CM

## 2015-09-15 DIAGNOSIS — E669 Obesity, unspecified: Secondary | ICD-10-CM | POA: Diagnosis not present

## 2015-09-15 DIAGNOSIS — Y95 Nosocomial condition: Secondary | ICD-10-CM | POA: Diagnosis present

## 2015-09-15 DIAGNOSIS — E7800 Pure hypercholesterolemia, unspecified: Secondary | ICD-10-CM | POA: Diagnosis not present

## 2015-09-15 DIAGNOSIS — E1149 Type 2 diabetes mellitus with other diabetic neurological complication: Secondary | ICD-10-CM | POA: Diagnosis present

## 2015-09-15 DIAGNOSIS — N289 Disorder of kidney and ureter, unspecified: Secondary | ICD-10-CM

## 2015-09-15 DIAGNOSIS — E1142 Type 2 diabetes mellitus with diabetic polyneuropathy: Secondary | ICD-10-CM | POA: Diagnosis not present

## 2015-09-15 LAB — COMPREHENSIVE METABOLIC PANEL
ALBUMIN: 3 g/dL — AB (ref 3.5–5.0)
ALK PHOS: 88 U/L (ref 38–126)
ALT: 10 U/L — ABNORMAL LOW (ref 14–54)
ANION GAP: 11 (ref 5–15)
AST: 17 U/L (ref 15–41)
BILIRUBIN TOTAL: 0.7 mg/dL (ref 0.3–1.2)
BUN: 14 mg/dL (ref 6–20)
CALCIUM: 8.9 mg/dL (ref 8.9–10.3)
CO2: 22 mmol/L (ref 22–32)
Chloride: 102 mmol/L (ref 101–111)
Creatinine, Ser: 1.11 mg/dL — ABNORMAL HIGH (ref 0.44–1.00)
GFR calc Af Amer: 53 mL/min — ABNORMAL LOW (ref >60)
GFR, EST NON AFRICAN AMERICAN: 46 mL/min — AB (ref >60)
GLUCOSE: 229 mg/dL — AB (ref 65–99)
POTASSIUM: 3.4 mmol/L — AB (ref 3.5–5.1)
Sodium: 135 mmol/L (ref 135–145)
TOTAL PROTEIN: 6 g/dL — AB (ref 6.5–8.1)

## 2015-09-15 LAB — URINE MICROSCOPIC-ADD ON

## 2015-09-15 LAB — DIFFERENTIAL
Basophils Absolute: 0.1 10*3/uL (ref 0.0–0.1)
Basophils Relative: 1 %
EOS ABS: 0.2 10*3/uL (ref 0.0–0.7)
EOS PCT: 3 %
LYMPHS ABS: 0.9 10*3/uL (ref 0.7–4.0)
LYMPHS PCT: 15 %
MONO ABS: 0.5 10*3/uL (ref 0.1–1.0)
MONOS PCT: 8 %
NEUTROS PCT: 73 %
Neutro Abs: 4.4 10*3/uL (ref 1.7–7.7)

## 2015-09-15 LAB — I-STAT TROPONIN, ED: TROPONIN I, POC: 0.01 ng/mL (ref 0.00–0.08)

## 2015-09-15 LAB — LACTIC ACID, PLASMA: LACTIC ACID, VENOUS: 1.2 mmol/L (ref 0.5–2.0)

## 2015-09-15 LAB — PROTIME-INR
INR: 1.1 (ref 0.00–1.49)
Prothrombin Time: 14.4 seconds (ref 11.6–15.2)

## 2015-09-15 LAB — MRSA PCR SCREENING: MRSA BY PCR: NEGATIVE

## 2015-09-15 LAB — I-STAT CHEM 8, ED
BUN: 17 mg/dL (ref 6–20)
CALCIUM ION: 1.15 mmol/L (ref 1.13–1.30)
CHLORIDE: 99 mmol/L — AB (ref 101–111)
CREATININE: 1.1 mg/dL — AB (ref 0.44–1.00)
GLUCOSE: 224 mg/dL — AB (ref 65–99)
HCT: 37 % (ref 36.0–46.0)
HEMOGLOBIN: 12.6 g/dL (ref 12.0–15.0)
POTASSIUM: 3.4 mmol/L — AB (ref 3.5–5.1)
Sodium: 137 mmol/L (ref 135–145)
TCO2: 24 mmol/L (ref 0–100)

## 2015-09-15 LAB — URINALYSIS, ROUTINE W REFLEX MICROSCOPIC
Bilirubin Urine: NEGATIVE
GLUCOSE, UA: 100 mg/dL — AB
Ketones, ur: NEGATIVE mg/dL
LEUKOCYTES UA: NEGATIVE
NITRITE: NEGATIVE
PH: 5.5 (ref 5.0–8.0)
Protein, ur: >300 mg/dL — AB
SPECIFIC GRAVITY, URINE: 1.022 (ref 1.005–1.030)

## 2015-09-15 LAB — I-STAT ARTERIAL BLOOD GAS, ED
ACID-BASE EXCESS: 1 mmol/L (ref 0.0–2.0)
Bicarbonate: 23.9 mEq/L (ref 20.0–24.0)
O2 SAT: 99 %
PO2 ART: 111 mmHg — AB (ref 80.0–100.0)
TCO2: 25 mmol/L (ref 0–100)
pCO2 arterial: 32.5 mmHg — ABNORMAL LOW (ref 35.0–45.0)
pH, Arterial: 7.474 — ABNORMAL HIGH (ref 7.350–7.450)

## 2015-09-15 LAB — I-STAT CG4 LACTIC ACID, ED
Lactic Acid, Venous: 3.33 mmol/L (ref 0.5–2.0)
Lactic Acid, Venous: 1.89 mmol/L (ref 0.5–2.0)

## 2015-09-15 LAB — GLUCOSE, CAPILLARY: Glucose-Capillary: 107 mg/dL — ABNORMAL HIGH (ref 65–99)

## 2015-09-15 LAB — INFLUENZA PANEL BY PCR (TYPE A & B)
H1N1FLUPCR: NOT DETECTED
INFLAPCR: NEGATIVE
INFLBPCR: NEGATIVE

## 2015-09-15 LAB — CBC
HEMATOCRIT: 33.3 % — AB (ref 36.0–46.0)
HEMOGLOBIN: 10.4 g/dL — AB (ref 12.0–15.0)
MCH: 26.1 pg (ref 26.0–34.0)
MCHC: 31.2 g/dL (ref 30.0–36.0)
MCV: 83.7 fL (ref 78.0–100.0)
Platelets: 167 10*3/uL (ref 150–400)
RBC: 3.98 MIL/uL (ref 3.87–5.11)
RDW: 16.9 % — ABNORMAL HIGH (ref 11.5–15.5)
WBC: 6 10*3/uL (ref 4.0–10.5)

## 2015-09-15 LAB — CBG MONITORING, ED: GLUCOSE-CAPILLARY: 206 mg/dL — AB (ref 65–99)

## 2015-09-15 LAB — APTT: aPTT: 32 seconds (ref 24–37)

## 2015-09-15 MED ORDER — HYDRALAZINE HCL 20 MG/ML IJ SOLN
10.0000 mg | Freq: Four times a day (QID) | INTRAMUSCULAR | Status: DC | PRN
  Administered 2015-09-16: 10 mg via INTRAVENOUS
  Filled 2015-09-15 (×2): qty 1

## 2015-09-15 MED ORDER — VITAMIN B-12 100 MCG PO TABS
100.0000 ug | ORAL_TABLET | ORAL | Status: DC
  Filled 2015-09-15: qty 1

## 2015-09-15 MED ORDER — INSULIN GLARGINE 100 UNIT/ML ~~LOC~~ SOLN
5.0000 [IU] | Freq: Every day | SUBCUTANEOUS | Status: DC
  Filled 2015-09-15: qty 0.05

## 2015-09-15 MED ORDER — ONDANSETRON HCL 4 MG/2ML IJ SOLN
4.0000 mg | Freq: Four times a day (QID) | INTRAMUSCULAR | Status: DC | PRN
  Administered 2015-09-15 – 2015-09-18 (×8): 4 mg via INTRAVENOUS
  Filled 2015-09-15 (×8): qty 2

## 2015-09-15 MED ORDER — INSULIN ASPART 100 UNIT/ML ~~LOC~~ SOLN: 0.0000 [IU] | SUBCUTANEOUS | Status: DC

## 2015-09-15 MED ORDER — SODIUM CHLORIDE 0.9 % IV BOLUS (SEPSIS)
500.0000 mL | Freq: Once | INTRAVENOUS | Status: AC
  Administered 2015-09-15: 500 mL via INTRAVENOUS

## 2015-09-15 MED ORDER — VANCOMYCIN HCL IN DEXTROSE 750-5 MG/150ML-% IV SOLN
750.0000 mg | Freq: Two times a day (BID) | INTRAVENOUS | Status: DC
  Administered 2015-09-16 – 2015-09-17 (×3): 750 mg via INTRAVENOUS
  Filled 2015-09-15 (×5): qty 150

## 2015-09-15 MED ORDER — LEVOTHYROXINE SODIUM 50 MCG PO TABS
50.0000 ug | ORAL_TABLET | Freq: Every day | ORAL | Status: DC
  Administered 2015-09-16 – 2015-09-19 (×4): 50 ug via ORAL
  Filled 2015-09-15 (×4): qty 1

## 2015-09-15 MED ORDER — DEXTROSE 5 % IV SOLN
2.0000 g | INTRAVENOUS | Status: DC
  Administered 2015-09-16 (×2): 2 g via INTRAVENOUS
  Filled 2015-09-15 (×3): qty 2

## 2015-09-15 MED ORDER — SENNOSIDES-DOCUSATE SODIUM 8.6-50 MG PO TABS: 1.0000 | ORAL_TABLET | Freq: Every evening | ORAL | Status: DC | PRN

## 2015-09-15 MED ORDER — ONDANSETRON HCL 4 MG/2ML IJ SOLN
4.0000 mg | Freq: Once | INTRAMUSCULAR | Status: AC
  Administered 2015-09-15: 4 mg via INTRAVENOUS
  Filled 2015-09-15: qty 2

## 2015-09-15 MED ORDER — VANCOMYCIN HCL IN DEXTROSE 1-5 GM/200ML-% IV SOLN
1000.0000 mg | Freq: Once | INTRAVENOUS | Status: AC
  Administered 2015-09-15: 1000 mg via INTRAVENOUS
  Filled 2015-09-15: qty 200

## 2015-09-15 MED ORDER — ACETAMINOPHEN 650 MG RE SUPP
650.0000 mg | RECTAL | Status: DC | PRN
  Filled 2015-09-15: qty 1

## 2015-09-15 MED ORDER — HYDRALAZINE HCL 20 MG/ML IJ SOLN
10.0000 mg | INTRAMUSCULAR | Status: DC | PRN
  Administered 2015-09-16 – 2015-09-17 (×4): 10 mg via INTRAVENOUS
  Filled 2015-09-15 (×3): qty 1

## 2015-09-15 MED ORDER — ACETAMINOPHEN 325 MG PO TABS
650.0000 mg | ORAL_TABLET | ORAL | Status: DC | PRN
  Administered 2015-09-16 – 2015-09-18 (×2): 650 mg via ORAL
  Filled 2015-09-15: qty 2

## 2015-09-15 MED ORDER — WHITE PETROLATUM GEL
Status: AC
  Administered 2015-09-15: 0.2
  Filled 2015-09-15: qty 1

## 2015-09-15 MED ORDER — STROKE: EARLY STAGES OF RECOVERY BOOK
Freq: Once | Status: AC
  Administered 2015-09-15: 20:00:00
  Filled 2015-09-15: qty 1

## 2015-09-15 MED ORDER — LORAZEPAM 2 MG/ML IJ SOLN
1.0000 mg | Freq: Once | INTRAMUSCULAR | Status: AC
  Administered 2015-09-15: 1 mg via INTRAVENOUS
  Filled 2015-09-15: qty 1

## 2015-09-15 MED ORDER — ASPIRIN EC 325 MG PO TBEC
325.0000 mg | DELAYED_RELEASE_TABLET | Freq: Every day | ORAL | Status: DC
  Filled 2015-09-15: qty 1

## 2015-09-15 MED ORDER — SERTRALINE HCL 50 MG PO TABS
25.0000 mg | ORAL_TABLET | Freq: Every day | ORAL | Status: DC
  Administered 2015-09-16 – 2015-09-19 (×4): 25 mg via ORAL
  Filled 2015-09-15 (×4): qty 1

## 2015-09-15 MED ORDER — HYDRALAZINE HCL 20 MG/ML IJ SOLN: 10.0000 mg | Freq: Four times a day (QID) | INTRAMUSCULAR | Status: DC | PRN

## 2015-09-15 MED ORDER — ATORVASTATIN CALCIUM 40 MG PO TABS
40.0000 mg | ORAL_TABLET | Freq: Every day | ORAL | Status: DC
  Administered 2015-09-16 – 2015-09-17 (×2): 40 mg via ORAL
  Filled 2015-09-15 (×3): qty 1

## 2015-09-15 MED ORDER — ENOXAPARIN SODIUM 40 MG/0.4ML ~~LOC~~ SOLN
40.0000 mg | SUBCUTANEOUS | Status: DC
  Administered 2015-09-15 – 2015-09-18 (×4): 40 mg via SUBCUTANEOUS
  Filled 2015-09-15 (×4): qty 0.4

## 2015-09-15 MED ORDER — ASPIRIN 300 MG RE SUPP
300.0000 mg | Freq: Every day | RECTAL | Status: DC
  Administered 2015-09-16: 300 mg via RECTAL
  Filled 2015-09-15: qty 1

## 2015-09-15 MED ORDER — SODIUM CHLORIDE 0.9 % IV SOLN
INTRAVENOUS | Status: DC
  Administered 2015-09-15: 20:00:00 via INTRAVENOUS
  Filled 2015-09-15: qty 1000

## 2015-09-15 NOTE — ED Notes (Signed)
Pt. Was having dry heaves and nausea prior to going to MRI. ATivan 1 mg IV given prior to MRI

## 2015-09-15 NOTE — H&P (Signed)
[facility] History and Physical                                                                                    [name], is a 81 y.o. female  MRN: [id number]   DOB - 1933/08/17  Admit Date - 09/15/2015  Outpatient Primary MD for the patient is Eliezer Lofts, MD  Referring Physician:  Liliane Bade  Chief Complaint:   Chief Complaint  Patient presents with  . Aphasia     HPI  [name]  is a 81 y.o. female, with a history of coronary artery disease status post CABG, breast cancer, uterine cancer, hypothyroidism, hyperlipidemia and hypertension. She presents to the emergency department today with dry heaves and aphasia.  She was admitted to [facility] on 12/9 and discharged on 12/11. She was treated for community-acquired pneumonia and anemia. She received 2 units of packed red blood cells during her admission. She has been nauseated and weak since discharge. She was discharged on Levaquin but was unable tolerate it, and it was changed to doxycycline on 12/15 by her primary care physician.  Reportedly she has been unable to tolerate several of her medications recently including blood pressure, antibiotics, and diabetic medications. So she has not been receiving them. On Saturday the patient began to have increased dry heaving and some vomiting episodes. She was at her normal baseline last night prior to bedtime, but this morning awoke confused. She was unable to work her CBG monitor, or remember her daughter's name. She has difficulty with word finding. She is very lethargic.  In the emergency department her initial lactic acid was 3.33, white count is not elevated, hemoglobin has risen to 12.6, urine is positive for yeast and hemoglobin. Chest x-ray shows a left pleural effusion.  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 This was discussed with radiology who felt that it would not explain her symptoms. Neurology was consults  of by the emergency department physician and recommends a stroke workup. The patient will be admitted to stepdown for acute encephalopathy, HCAP and  possible stroke.  Review of Systems  Constitutional: Positive for malaise/fatigue. Negative for fever and chills.  HENT: Negative.   Eyes: Negative.   Respiratory: Negative.   Cardiovascular: Negative.   Gastrointestinal: Positive for nausea and vomiting.  Genitourinary: Negative.   Musculoskeletal: Negative.   Skin: Negative.   Neurological: Positive for dizziness, speech change and weakness.  Endo/Heme/Allergies: Negative.   Psychiatric/Behavioral: Negative.      Past Medical History  Past Medical History  Diagnosis Date  . Cellulitis     R ARM  . Coronary artery disease   . Hypothyroidism   . Hyperlipidemia   . Hypertension     Past Surgical History  Procedure Laterality Date  . Total abdominal hysterectomy  1993  . Mastectomy  1979    right, followed by 2 years chemo  . Total hip arthroplasty  08/2009    right  . Appendectomy    . US echocardiography  05/2009    EF 55-60%,MILD-MOD LVH,, MILD-MOD A STENOSIS,  . Nuclear stress test  07/2009  EF 69%,  MILD ISCHEMIA IN INFEROLATERAL WALL  . Coronary artery bypass graft  2008  . Cardiac catheterization        Social History Social History  Substance Use Topics  . Smoking status: Never Smoker   . Smokeless tobacco: Never Used  . Alcohol Use: No   normally lives at home. Independent with ADLs. Currently her daughters are staying with her.  Family History Family History  Problem Relation Age of Onset  . Cancer Mother     ? stomach cancer  . Diabetes Mother   . Heart disease Father 30  . Diabetes Brother   . Throat cancer Brother    no history of stroke in the family.  Prior to Admission medications   Medication Sig Start Date End Date Taking? Authorizing Provider  aspirin 81 MG tablet Take 81 mg by mouth daily.      Historical Provider, MD  atorvastatin  (LIPITOR) 40 MG tablet Take 1 tablet (40 mg total) by mouth daily. 08/28/15   Brittainy Erie Noe, PA-C  cyanocobalamin 1000 MCG tablet Take 100 mcg by mouth once a week. Reported on 09/11/2015    Historical Provider, MD  Ferrous Sulfate (IRON) 325 (65 FE) MG TABS Take 1 tablet by mouth 2 (two) times daily. Reported on 09/11/2015    Historical Provider, MD  gabapentin (NEURONTIN) 100 MG capsule Take 100 mg by mouth 3 (three) times daily. Reported on 09/11/2015    Historical Provider, MD  glipiZIDE (GLUCOTROL XL) 10 MG 24 hr tablet TAKE 1 TABLET BY MOUTH EVERY DAY Patient not taking: Reported on 09/11/2015 06/06/15   Amy E Diona Browner, MD  levothyroxine (SYNTHROID, LEVOTHROID) 50 MCG tablet TAKE 1 TABLET BY MOUTH EVERY DAY 01/26/15   Amy Cletis [place], MD  losartan (COZAAR) 50 MG tablet Take 1 tablet (50 mg total) by mouth daily. Patient not taking: Reported on 09/11/2015 08/28/15   Brittainy M Rosita Fire, PA-C  metoprolol succinate (TOPROL-XL) 25 MG 24 hr tablet Take 1 tablet (25 mg total) by mouth 2 (two) times daily. Patient not taking: Reported on 09/11/2015 08/28/15   Brittainy M Rosita Fire, PA-C  nitroGLYCERIN (NITROSTAT) 0.4 MG SL tablet Place 1 tablet (0.4 mg total) under the tongue every 5 (five) minutes as needed for chest pain (x 3 tabs daily). Patient not taking: Reported on 09/11/2015 08/28/15   Brittainy M Rosita Fire, PA-C  ondansetron (ZOFRAN ODT) 4 MG disintegrating tablet Take 1 tablet (4 mg total) by mouth daily. 09/09/15   Amy Cletis [place], MD  pioglitazone (ACTOS) 30 MG tablet Take 30 mg by mouth daily. Reported on 09/11/2015    Historical Provider, MD  sertraline (ZOLOFT) 25 MG tablet TAKE 1 TABLET (25 MG TOTAL) BY MOUTH DAILY. Patient not taking: Reported on 09/11/2015 06/29/15   Jinny Sanders, MD  triamterene-hydrochlorothiazide (DYAZIDE) 37.5-25 MG capsule Take 1 each (1 capsule total) by mouth daily. Patient not taking: Reported on 09/11/2015 08/28/15   Brittainy Erie Noe, PA-C    Allergies   Allergen Reactions  . Morphine And Related Other (See Comments)    BAD HEADACHE    Physical Exam  Vitals  Blood pressure 202/67, pulse 61, temperature 99 F (37.2 C), temperature source Rectal, resp. rate 12, height 5\' 2"  (1.575 m), weight 83.915 kg (185 lb), SpO2 100 %.   General: Sleepy, elderly female lying in bed. Appears nauseated. Daughters at bedside.  Psych:  Awake, cooperative, pleasant  Neuro:   Follows some commands but is unable to touch finger to  nose, has difficulty squeezing my hand, no tongue deviation, strength appears symmetric.  ENT:  Ears and Eyes appear Normal, Conjunctivae clear, PER. Moist oral mucosa without erythema or exudates.  Neck:  Supple, No lymphadenopathy appreciated  Respiratory:  Symmetrical chest wall movement, Good air movement bilaterally, CTAB.  Cardiac:  RRR, No Murmurs, no LE edema noted, no JVD.    Abdomen:  Positive bowel sounds, Soft, Non tender, Non distended,  No masses appreciated  Skin:  No Cyanosis, Normal Skin Turgor, No Skin Rash or Bruise.  Extremities:  Able to move all 4. 5/5 strength in each,  no effusions.  Data Review  Wt Readings from Last 3 Encounters:  09/15/15 83.915 kg (185 lb)  09/11/15 84.142 kg (185 lb 8 oz)  09/07/15 86.637 kg (191 lb)    CBC  Recent Labs Lab 09/11/15 1138 09/15/15 1237 09/15/15 1246  WBC 8.2 6.0  --   HGB 10.6* 10.4* 12.6  HCT 34.2* 33.3* 37.0  PLT 209.0 167  --   MCV 82.7 83.7  --   MCH  --  26.1  --   MCHC 31.1 31.2  --   RDW 19.8* 16.9*  --   LYMPHSABS 1.1 0.9  --   MONOABS 0.7 0.5  --   EOSABS 0.2 0.2  --   BASOSABS 0.1 0.1  --     Chemistries   Recent Labs Lab 09/11/15 1138 09/15/15 1237 09/15/15 1246  NA 137 135 137  K 4.0 3.4* 3.4*  CL 101 102 99*  CO2 27 22  --   GLUCOSE 274* 229* 224*  BUN 14 14 17   CREATININE 1.30* 1.11* 1.10*  CALCIUM 9.3 8.9  --   AST  --  17  --   ALT  --  10*  --   ALKPHOS  --  88  --   BILITOT  --  0.7  --      Lab  Results  Component Value Date   HGBA1C 6.8* 09/05/2015    CREATININE: 1.1 mg/dL ABNORMAL (09/15/15 1246) Estimated creatinine clearance - 41 mL/min    Coagulation profile  Recent Labs Lab 09/15/15 1237  INR 1.10     Urinalysis    Component Value Date/Time   COLORURINE YELLOW 09/15/2015 1432   APPEARANCEUR HAZY* 09/15/2015 1432   LABSPEC 1.022 09/15/2015 1432   PHURINE 5.5 09/15/2015 1432   GLUCOSEU 100* 09/15/2015 1432   HGBUR LARGE* 09/15/2015 1432   BILIRUBINUR NEGATIVE 09/15/2015 1432   BILIRUBINUR negative 10/22/2014 1220   KETONESUR NEGATIVE 09/15/2015 1432   PROTEINUR >300* 09/15/2015 1432   PROTEINUR trace 10/22/2014 1220   UROBILINOGEN 0.2 10/22/2014 1220   UROBILINOGEN 0.2 10/11/2014 1233   NITRITE NEGATIVE 09/15/2015 1432   NITRITE negative 10/22/2014 1220   LEUKOCYTESUR NEGATIVE 09/15/2015 1432    Imaging results:   Dg Chest 2 View  09/05/2015  CLINICAL DATA:  Shortness of breath for 1 week EXAM: CHEST  2 VIEW COMPARISON:  October 11, 2014 FINDINGS: There is consolidation in the lateral and posterior left base regions with small left effusion. Lungs elsewhere clear. Heart is mildly enlarged with pulmonary vascularity within normal limits. Patient is status post coronary artery bypass grafting. No adenopathy. There is a hiatal hernia present. There is atherosclerotic change throughout the aorta. No bone lesions. IMPRESSION: Infiltrate left base with small left effusion. Moderate hiatal hernia, stable. Stable cardiomegaly. Followup PA and lateral chest radiographs recommended in 3-4 weeks following trial of antibiotic therapy to ensure resolution and  exclude underlying malignancy. Electronically Signed   By: Lowella Grip III M.D.   On: 09/05/2015 13:41   Ct Head Wo Contrast  09/15/2015  CLINICAL DATA:  81 year old hypertensive female recently discharged from hospital for treatment of pneumonia. Presenting with aphasia and confusion. Initial encounter.  EXAM: CT HEAD WITHOUT CONTRAST TECHNIQUE: Contiguous axial images were obtained from the base of the skull through the vertex without intravenous contrast. COMPARISON:  05/02/2008. FINDINGS: Slightly dense appearance of the basilar artery/basilar tip which appears different than the prior CT and in the proper clinical setting, thrombosis could not be excluded. Alternatively, this may be related to atherosclerotic changes. Otherwise no CT evidence of large acute infarct. Moderate small vessel disease type changes. No intracranial hemorrhage. Global atrophy without hydrocephalus. No intracranial mass lesion noted on this unenhanced exam. Vascular calcifications. Mastoid air cells, middle ear cavities and visualized paranasal sinuses are clear. Post lens replacement.  Exophthalmos. IMPRESSION: Slightly dense appearance of the basilar artery/basilar tip which appears different than the prior CT and in the proper clinical setting, thrombosis could not be excluded. Alternatively, this may be related to atherosclerotic changes. Otherwise no CT evidence of large acute infarct. Moderate small vessel disease type changes. No intracranial hemorrhage. Global atrophy without hydrocephalus. Electronically Signed   By: Genia Del M.D.   On: 09/15/2015 13:46   Dg Chest Portable 1 View  09/15/2015  CLINICAL DATA:  Altered mental status, recent pneumonia EXAM: PORTABLE CHEST 1 VIEW COMPARISON:  09/05/2015 FINDINGS: Cardiomegaly again noted. Status post CABG. There is small left pleural effusion with left basilar atelectasis or infiltrate. No pulmonary edema. IMPRESSION: Small left pleural effusion left basilar atelectasis or infiltrate. No pulmonary edema. Electronically Signed   By: Lahoma Crocker M.D.   On: 09/15/2015 13:29    My personal review of EKG: sinus rhythm, prolonged QT.   Assessment & Plan  Principal Problem:   Acute encephalopathy Active Problems:   Diabetes mellitus with neurological manifestation (HCC)    Hypothyroidism   High cholesterol   HTN (hypertension)   Iron deficiency anemia   Peripheral neuropathy (HCC)   Renal insufficiency   Aphasia   HCAP (healthcare-associated pneumonia)   Acute encephalopathy Uncertain etiology. Possible HCAP versus neurological deficit need to rule out stroke. Will admit to step down  HCAP Patient with likely inadequately treated for her community-acquired pneumonia as she could not tolerate the oral antibiotics. Will start her on vancomycin and cefepime. Obtain blood and sputum cultures. Check influenza panel.  Aphasia and confusion Seen by neurology.  Will start full dose aspirin.  In proceed with stroke workup including MRI/MRA/echo/carotids/serologies/PT/OT/speech  Diabetes mellitus Patient is currently nothing by mouth so we will use every 4 hours CBGs. Place on low-dose Lantus and sliding scale sensitive.   Hypothyroidism Continue supplementation   High cholesterol  Continue atorvastatin   Renal insufficiency Creatinine at baseline   Iron deficiency anemia Received 2 units of packed red blood cells to her last admission. Hemoglobin appears stable. We'll hold iron supplementation this point as the patient's nauseated. She will need an upper endoscopy, enteroscopy and colonoscopy after discharge.    Consultants Called:    Neurology  Family Communication: Daughters -  Britiany Socks and Irineo Axon are at [place] can be reached on [phone], Lynelle Smoke can be reached on [phone].  Code Status:    Full code until her children are able to discuss it together.  Condition:  Guarded.    Potential Disposition:   To be  determined.  Will likely require a 4+ day stay.  Time spent in minutes : [place],  [place] on 09/15/2015 at 4:31 PM Between 7am to 7pm - Pager - [phone] After 7pm go to [email or web address] - password [email or web address] And look for the night coverage person covering me after  hours

## 2015-09-15 NOTE — Progress Notes (Addendum)
RN, Catalina Antigua, paged because pt's MRI was neg and BP is high. Matt asking if we can control BP more tightly now. This NP touched base with P. Sumner, neuro, who had reviewed MRI brain and agreed it was neg for acute infarct. Dr. Janann Colonel stated we could lower BP now. Pt on Hydralazine, but parameters for giving tightened.  Pt failed RN swallow study. Holding po meds tonight until ST eval tomorrow. ASA changed to suppository.  KJKG, NP [facility] Update: pt's CBGs trending down. Since NPO, add D5W at 50cc/hr with note to call if sugars still low or go over 140. D/c'd Lantus.  KJKG, NP [facility]

## 2015-09-15 NOTE — ED Notes (Signed)
Called the floor to inform them we will be transferring the pt. Up. She just came back from MRI

## 2015-09-15 NOTE — Consult Note (Signed)
Requesting Physician: Dr. Alvino Chapel    Chief Complaint: Stroke  HPI:                                                                                                                                         [name] is an 81 y.o. female who was normal last night. This AM daughter noted she was having difficulty following commands, could not make a sentence --"word salad", having difficulty doing normal tasks.  She had no difficulty with her strength however. Due to fear she may be hypoglycemic she was brought to the ED.  On exam ED MD noted aphasia and possible right field cut and Neurology was called. Patient currently in no distress. Per daughter, she has been N/V for past week and has not been able to keep her ASA down. In addition she had not been able to keep her BP medications down and her BP was in the systolic [id number] for the past three days.   Date last known well: Date: 09/15/2015 Time last known well: Unable to determine tPA Given: No: out of window     Past Medical History  Diagnosis Date  . Cellulitis     R ARM  . Coronary artery disease   . Hypothyroidism   . Hyperlipidemia   . Hypertension     Past Surgical History  Procedure Laterality Date  . Total abdominal hysterectomy  1993  . Mastectomy  1979    right, followed by 2 years chemo  . Total hip arthroplasty  08/2009    right  . Appendectomy    . US echocardiography  05/2009    EF 55-60%,MILD-MOD LVH,, MILD-MOD A STENOSIS,  . Nuclear stress test  07/2009    EF 69%,  MILD ISCHEMIA IN INFEROLATERAL WALL  . Coronary artery bypass graft  2008  . Cardiac catheterization      Family History  Problem Relation Age of Onset  . Cancer Mother     ? stomach cancer  . Diabetes Mother   . Heart disease Father 38  . Diabetes Brother   . Throat cancer Brother    Social History:  reports that she has never smoked. She has never used smokeless tobacco. She reports that she does not drink alcohol or use illicit  drugs.  Allergies:  Allergies  Allergen Reactions  . Morphine And Related Other (See Comments)    BAD HEADACHE    Medications:  No current facility-administered medications for this encounter.   Current Outpatient Prescriptions  Medication Sig Dispense Refill  . aspirin 81 MG tablet Take 81 mg by mouth daily.      Marland Kitchen atorvastatin (LIPITOR) 40 MG tablet Take 1 tablet (40 mg total) by mouth daily. 90 tablet 1  . cyanocobalamin 1000 MCG tablet Take 100 mcg by mouth once a week. Reported on 09/11/2015    . doxycycline (VIBRA-TABS) 100 MG tablet Take 1 tablet (100 mg total) by mouth 2 (two) times daily. 6 tablet 0  . Ferrous Sulfate (IRON) 325 (65 FE) MG TABS Take 1 tablet by mouth 2 (two) times daily. Reported on 09/11/2015    . gabapentin (NEURONTIN) 100 MG capsule Take 100 mg by mouth 3 (three) times daily. Reported on 09/11/2015    . glipiZIDE (GLUCOTROL XL) 10 MG 24 hr tablet TAKE 1 TABLET BY MOUTH EVERY DAY (Patient not taking: Reported on 09/11/2015) 30 tablet 5  . levofloxacin (LEVAQUIN) 500 MG tablet Take 1 tablet (500 mg total) by mouth daily. 4 tablet 0  . levothyroxine (SYNTHROID, LEVOTHROID) 50 MCG tablet TAKE 1 TABLET BY MOUTH EVERY DAY 30 tablet 10  . losartan (COZAAR) 50 MG tablet Take 1 tablet (50 mg total) by mouth daily. (Patient not taking: Reported on 09/11/2015) 90 tablet 1  . metoprolol succinate (TOPROL-XL) 25 MG 24 hr tablet Take 1 tablet (25 mg total) by mouth 2 (two) times daily. (Patient not taking: Reported on 09/11/2015) 180 tablet 1  . nitroGLYCERIN (NITROSTAT) 0.4 MG SL tablet Place 1 tablet (0.4 mg total) under the tongue every 5 (five) minutes as needed for chest pain (x 3 tabs daily). (Patient not taking: Reported on 09/11/2015) 25 tablet 3  . ondansetron (ZOFRAN ODT) 4 MG disintegrating tablet Take 1 tablet (4 mg total) by mouth daily. 30  tablet 0  . pioglitazone (ACTOS) 30 MG tablet Take 30 mg by mouth daily. Reported on 09/11/2015    . sertraline (ZOLOFT) 25 MG tablet TAKE 1 TABLET (25 MG TOTAL) BY MOUTH DAILY. (Patient not taking: Reported on 09/11/2015) 30 tablet 5  . triamterene-hydrochlorothiazide (DYAZIDE) 37.5-25 MG capsule Take 1 each (1 capsule total) by mouth daily. (Patient not taking: Reported on 09/11/2015) 90 capsule 1    ROS:                                                                                                                                       History obtained from the patient  General ROS: negative for - chills, fatigue, fever, night sweats, weight gain or weight loss Psychological ROS: negative for - behavioral disorder, hallucinations, memory difficulties, mood swings or suicidal ideation Ophthalmic ROS: negative for - blurry vision, double vision, eye pain or loss of vision ENT ROS: negative for - epistaxis, nasal discharge, oral lesions, sore throat, tinnitus or vertigo Allergy and Immunology ROS: negative for - hives or itchy/watery  eyes Hematological and Lymphatic ROS: negative for - bleeding problems, bruising or swollen lymph nodes Endocrine ROS: negative for - galactorrhea, hair pattern changes, polydipsia/polyuria or temperature intolerance Respiratory ROS: negative for - cough, hemoptysis, shortness of breath or wheezing Cardiovascular ROS: negative for - chest pain, dyspnea on exertion, edema or irregular heartbeat Gastrointestinal ROS: negative for - abdominal pain, diarrhea, hematemesis, nausea/vomiting or stool incontinence Genito-Urinary ROS: negative for - dysuria, hematuria, incontinence or urinary frequency/urgency Musculoskeletal ROS: negative for - joint swelling or muscular weakness Neurological ROS: as noted in HPI Dermatological ROS: negative for rash and skin lesion changes  Neurologic Examination:                                                                                                       Blood pressure 166/58, pulse 66, temperature 98.5 F (36.9 C), temperature source Oral, resp. rate 20, height 5\' 2"  (1.575 m), weight 83.915 kg (185 lb), SpO2 100 %.  HEENT-  Normocephalic, no lesions, without obvious abnormality.  Normal external eye and conjunctiva.  Normal TM's bilaterally.  Normal auditory canals and external ears. Normal external nose, mucus membranes and septum.  Normal pharynx. Cardiovascular- S1, S2 normal, pulses palpable throughout   Lungs- chest clear, no wheezing, rales, normal symmetric air entry Abdomen- normal findings: bowel sounds normal Extremities- no edema Lymph-no adenopathy palpable Musculoskeletal-no joint tenderness, deformity or swelling Skin-warm and dry, no hyperpigmentation, vitiligo, or suspicious lesions  Neurological Examination Mental Status: Alert, aphasiac both expressive and receptive. Able to mimic visual commands.  Cranial Nerves: II: Discs flat bilaterally; Visual fields shows a right field cut, pupils equal, round, reactive to light and accommodation III,IV, VI: ptosis not present, extra-ocular motions intact bilaterally V,VII: smile symmetric, facial light touch sensation normal bilaterally VIII: hearing normal bilaterally IX,X: uvula rises symmetrically XI: bilateral shoulder shrug XII: midline tongue extension Motor: Right : Upper extremity   5/5    Left:     Upper extremity   5/5  Lower extremity   5/5     Lower extremity   5/5 Tone and bulk:normal tone throughout; no atrophy noted Sensory: Pinprick and light touch intact throughout, bilaterally Deep Tendon Reflexes: 2+ and symmetric throughout UE and KJ no AJ Plantars: Right: downgoing   Left: downgoing Cerebellar: No dysmetria with H-S or F-N Gait: normal gait and station       Lab Results: Basic Metabolic Panel:  Recent Labs Lab 09/11/15 1138 09/15/15 1246  NA 137 137  K 4.0 3.4*  CL 101 99*  CO2 27  --   GLUCOSE 274* 224*  BUN  14 17  CREATININE 1.30* 1.10*  CALCIUM 9.3  --     Liver Function Tests: No results for input(s): AST, ALT, ALKPHOS, BILITOT, PROT, ALBUMIN in the last 168 hours. No results for input(s): LIPASE, AMYLASE in the last 168 hours. No results for input(s): AMMONIA in the last 168 hours.  CBC:  Recent Labs Lab 09/11/15 1138 09/15/15 1237 09/15/15 1246  WBC 8.2 6.0  --   NEUTROABS 6.2 4.4  --   HGB  10.6* 10.4* 12.6  HCT 34.2* 33.3* 37.0  MCV 82.7 83.7  --   PLT 209.0 167  --     Cardiac Enzymes: No results for input(s): CKTOTAL, CKMB, CKMBINDEX, TROPONINI in the last 168 hours.  Lipid Panel: No results for input(s): CHOL, TRIG, HDL, CHOLHDL, VLDL, LDLCALC in the last 168 hours.  CBG: No results for input(s): GLUCAP in the last 168 hours.  Microbiology: Results for orders placed or performed in visit on 10/22/14  Urine culture     Status: None   Collection Time: 10/22/14 12:53 PM  Result Value Ref Range Status   Culture ESCHERICHIA COLI  Final   Colony Count 70,000 COLONIES/ML  Final   Organism ID, Bacteria ESCHERICHIA COLI  Final      Susceptibility   Escherichia coli -  (no method available)    AMPICILLIN 4 Sensitive     AMOX/CLAVULANIC 4 Sensitive     AMPICILLIN/SULBACTAM 4 Sensitive     PIP/TAZO <=4 Sensitive     IMIPENEM <=0.25 Sensitive     CEFAZOLIN <=4 Sensitive     CEFTRIAXONE <=1 Sensitive     CEFTAZIDIME <=1 Sensitive     CEFEPIME <=1 Sensitive     GENTAMICIN 4 Sensitive     TOBRAMYCIN 2 Sensitive     CIPROFLOXACIN <=0.25 Sensitive     LEVOFLOXACIN <=0.12 Sensitive     NITROFURANTOIN <=16 Sensitive     TRIMETH/SULFA <=20 Sensitive     Coagulation Studies:  Recent Labs  09/15/15 1237  LABPROT 14.4  INR 1.10    Imaging: No results found.     Assessment and plan discussed with with attending physician and they are in agreement.    Etta Quill PA-C [facility] [phone]  09/15/2015, 1:19 PM   Assessment: 81 y.o. female  with new onset expressive and receptive aphasia in the setting of elevated BP. Exam is notable for aphasia with out weakness or sensory deficits.  Likely a distal left MCA branch infarct. CT head and MRI pending.   Stroke Risk Factors - hyperlipidemia and hypertension  1. HgbA1c, fasting lipid panel 2. MRI, MRA  of the brain without contrast 3. PT consult, OT consult, Speech consult 4. Echocardiogram 5. Carotid dopplers 6. Prophylactic therapy-Antiplatelet med: Aspirin - dose 325 mg daily and 300 PR until passes stroke swallow screen and CT confirm no ICH 7. Risk factor modification 8. Telemetry monitoring 9. Frequent neuro checks 10 NPO until passes stroke swallow screen 11 please page stroke NP  Or  PA  Or MD  M-F from 8am -4 pm starting 12.19.2016 as this followed by the stroke team at this point.   You can look them up on [email or web address]  Password [email or web address]     Discussed and agree with above plan  Norman Clay MD  [phone]

## 2015-09-15 NOTE — Progress Notes (Signed)
ANTIBIOTIC CONSULT NOTE - INITIAL  Pharmacy Consult for Vancomycin and Cefepime Indication: rule out pneumonia  Allergies  Allergen Reactions  . Morphine And Related Other (See Comments)    BAD HEADACHE    Patient Measurements: Height: 5\' 2"  (157.5 cm) Weight: 185 lb (83.915 kg) IBW/kg (Calculated) : 50.1 Adjusted Body Weight:   Vital Signs: Temp: 99 F (37.2 C) (12/19 1429) Temp Source: Rectal (12/19 1429) BP: 202/67 mmHg (12/19 1500) Pulse Rate: 61 (12/19 1500) Intake/Output from previous day:   Intake/Output from this shift:    Labs:  Recent Labs  09/15/15 1237 09/15/15 1246  WBC 6.0  --   HGB 10.4* 12.6  PLT 167  --   CREATININE 1.11* 1.10*   Estimated Creatinine Clearance: 41 mL/min (by C-G formula based on Cr of 1.1). No results for input(s): VANCOTROUGH, VANCOPEAK, VANCORANDOM, GENTTROUGH, GENTPEAK, GENTRANDOM, TOBRATROUGH, TOBRAPEAK, TOBRARND, AMIKACINPEAK, AMIKACINTROU, AMIKACIN in the last 72 hours.   Microbiology: No results found for this or any previous visit (from the past 720 hour(s)).  Medical History: Past Medical History  Diagnosis Date  . Cellulitis     R ARM  . Coronary artery disease   . Hypothyroidism   . Hyperlipidemia   . Hypertension     Medications:   (Not in a hospital admission) Scheduled:  Infusions:   Assessment: 81yo female with history of CAD, HLD, hyperthyroid and HTN presents with aphasia. Pharmacy is consulted to dose vancomycin and cefepime for suspected HCAP. Tmax 99, WBC 6, sCr 1.1, LA 3.33.  Goal of Therapy:  Vancomycin trough level 15-20 mcg/ml  Plan:  Vancomycin 1g IV now followed by 750mg  q12h Cefepime 2g IV q24h Expected duration 7 days with resolution of temperature and/or normalization of WBC Measure antibiotic drug levels at steady state Follow up culture results, renal function and clinical course  Andrey Cota. Diona Foley, PharmD, [facility] Pager [phone] 09/15/2015,4:32 PM

## 2015-09-15 NOTE — Telephone Encounter (Signed)
Last office visit 09/11/2015. Ok to refill?

## 2015-09-15 NOTE — ED Notes (Signed)
Pt. Is  Having nausea with dry heaves when position change and movement.   Reported to Md.

## 2015-09-15 NOTE — Telephone Encounter (Signed)
Daughter (tami Fields) called - pt is going [facility] now for possible dehydration, to the ED  cb [phone]

## 2015-09-15 NOTE — ED Notes (Signed)
Unable to assess NIH with pt.  She is unable to answer and follow directions due to cognitive impairment.  Pt. Is also very fatiqued

## 2015-09-15 NOTE — ED Notes (Signed)
Pt here for expressive asphasia and confusion. Alert to self and day of the week. Per family pt woke up this am and she noticed the patient was having difficulty using her CBG machine. sts seemed confused. sts then she talked to her and the patient didn't know who she was and was not making sense when she talked. Pt sts she is tired. Confused about where she is, the month, the situation. Grips equal, no facial drool noted. Unsure of a time when all of this started.

## 2015-09-15 NOTE — ED Provider Notes (Signed)
CSN: [id number]     Arrival date & time 09/15/15  1226 History   First MD Initiated Contact with Patient 09/15/15 1242     Chief Complaint  Patient presents with  . Aphasia     (Consider location/radiation/quality/duration/timing/severity/associated sxs/prior Treatment) Patient is a 81 y.o. female presenting with altered mental status. The history is provided by the patient.  Altered Mental Status Presenting symptoms: behavior changes, confusion and disorientation   Presenting symptoms: no partial responsiveness and no unresponsiveness   Severity:  Moderate Most recent episode:  Today Episode history:  Single Timing:  Constant Progression:  Worsening Chronicity:  New Context: recent infection   Context: not alcohol use, not dementia, not drug use, not head injury, taking medications as prescribed, not a nursing home resident and not a recent illness   Associated symptoms: nausea   Associated symptoms: no abdominal pain, normal movement, no agitation, no bladder incontinence, no decreased appetite, no depression, no difficulty breathing, no eye deviation, no fever, no headaches, no light-headedness, no palpitations, no rash, no seizures, no slurred speech, no suicidal behavior, no visual change, no vomiting and no weakness     Past Medical History  Diagnosis Date  . Cellulitis     R ARM  . Coronary artery disease   . Hypothyroidism   . Hyperlipidemia   . Hypertension    Past Surgical History  Procedure Laterality Date  . Total abdominal hysterectomy  1993  . Mastectomy  1979    right, followed by 2 years chemo  . Total hip arthroplasty  08/2009    right  . Appendectomy    . US echocardiography  05/2009    EF 55-60%,MILD-MOD LVH,, MILD-MOD A STENOSIS,  . Nuclear stress test  07/2009    EF 69%,  MILD ISCHEMIA IN INFEROLATERAL WALL  . Coronary artery bypass graft  2008  . Cardiac catheterization     Family History  Problem Relation Age of Onset  . Cancer Mother     ?  stomach cancer  . Diabetes Mother   . Heart disease Father 3  . Diabetes Brother   . Throat cancer Brother    Social History  Substance Use Topics  . Smoking status: Never Smoker   . Smokeless tobacco: Never Used  . Alcohol Use: No   OB History    No data available     Review of Systems  Constitutional: Positive for fatigue. Negative for fever and decreased appetite.  HENT: Negative for congestion, drooling and facial swelling.   Eyes: Negative for pain.  Respiratory: Negative for chest tightness and shortness of breath.   Cardiovascular: Negative for palpitations.  Gastrointestinal: Positive for nausea. Negative for vomiting, abdominal pain, diarrhea and constipation.  Genitourinary: Negative for bladder incontinence, hematuria, vaginal bleeding and difficulty urinating.  Musculoskeletal: Negative for back pain, gait problem, neck pain and neck stiffness.  Skin: Negative for rash.  Neurological: Negative for dizziness, seizures, syncope, facial asymmetry, weakness, light-headedness and headaches.  Psychiatric/Behavioral: Positive for confusion. Negative for agitation.      Allergies  Morphine and related  Home Medications   Prior to Admission medications   Medication Sig Start Date End Date Taking? Authorizing Provider  aspirin 81 MG tablet Take 81 mg by mouth daily.      Historical Provider, MD  atorvastatin (LIPITOR) 40 MG tablet Take 1 tablet (40 mg total) by mouth daily. 08/28/15   Brittainy Erie Noe, PA-C  cyanocobalamin 1000 MCG tablet Take 100 mcg by mouth once a  week. Reported on 09/11/2015    Historical Provider, MD  doxycycline (VIBRA-TABS) 100 MG tablet Take 1 tablet (100 mg total) by mouth 2 (two) times daily. 09/11/15   Amy Cletis Athens, MD  Ferrous Sulfate (IRON) 325 (65 FE) MG TABS Take 1 tablet by mouth 2 (two) times daily. Reported on 09/11/2015    Historical Provider, MD  gabapentin (NEURONTIN) 100 MG capsule Take 100 mg by mouth 3 (three) times daily.  Reported on 09/11/2015    Historical Provider, MD  glipiZIDE (GLUCOTROL XL) 10 MG 24 hr tablet TAKE 1 TABLET BY MOUTH EVERY DAY Patient not taking: Reported on 09/11/2015 06/06/15   Amy E Diona Browner, MD  levofloxacin (LEVAQUIN) 500 MG tablet Take 1 tablet (500 mg total) by mouth daily. 09/07/15   Delfina Redwood, MD  levothyroxine (SYNTHROID, LEVOTHROID) 50 MCG tablet TAKE 1 TABLET BY MOUTH EVERY DAY 01/26/15   Amy Cletis Athens, MD  losartan (COZAAR) 50 MG tablet Take 1 tablet (50 mg total) by mouth daily. Patient not taking: Reported on 09/11/2015 08/28/15   Brittainy M Rosita Fire, PA-C  metoprolol succinate (TOPROL-XL) 25 MG 24 hr tablet Take 1 tablet (25 mg total) by mouth 2 (two) times daily. Patient not taking: Reported on 09/11/2015 08/28/15   Brittainy M Rosita Fire, PA-C  nitroGLYCERIN (NITROSTAT) 0.4 MG SL tablet Place 1 tablet (0.4 mg total) under the tongue every 5 (five) minutes as needed for chest pain (x 3 tabs daily). Patient not taking: Reported on 09/11/2015 08/28/15   Brittainy M Rosita Fire, PA-C  ondansetron (ZOFRAN ODT) 4 MG disintegrating tablet Take 1 tablet (4 mg total) by mouth daily. 09/09/15   Amy Cletis Athens, MD  pioglitazone (ACTOS) 30 MG tablet Take 30 mg by mouth daily. Reported on 09/11/2015    Historical Provider, MD  sertraline (ZOLOFT) 25 MG tablet TAKE 1 TABLET (25 MG TOTAL) BY MOUTH DAILY. Patient not taking: Reported on 09/11/2015 06/29/15   Jinny Sanders, MD  triamterene-hydrochlorothiazide (DYAZIDE) 37.5-25 MG capsule Take 1 each (1 capsule total) by mouth daily. Patient not taking: Reported on 09/11/2015 08/28/15   Brittainy M Simmons, PA-C   BP 166/58 mmHg  Pulse 66  Temp(Src) 98.5 F (36.9 C) (Oral)  Resp 20  SpO2 100% Physical Exam  Constitutional: She appears well-developed and well-nourished. No distress.  HENT:  Head: Normocephalic and atraumatic.  Mouth/Throat: No oropharyngeal exudate.  Eyes: Conjunctivae and EOM are normal. Pupils are equal, round, and reactive  to light. Right eye exhibits no discharge. Left eye exhibits no discharge.  Neck: Normal range of motion. Neck supple.  Cardiovascular: Normal rate, regular rhythm and normal heart sounds.  Exam reveals no gallop and no friction rub.   No murmur heard. Pulmonary/Chest: Effort normal. No tachypnea. No respiratory distress. She has decreased breath sounds in the left middle field and the left lower field. She has no wheezes. She has no rhonchi.  Abdominal: Soft. Bowel sounds are normal. She exhibits no distension and no mass. There is no tenderness. There is no rebound and no guarding.  Neurological: She is alert. She has normal strength. She is disoriented. No cranial nerve deficit or sensory deficit. Coordination (Pt unable to follow 2 step commands) abnormal. GCS eye subscore is 4. GCS verbal subscore is 5. GCS motor subscore is 6.  Skin: Skin is warm and dry. No rash noted. She is not diaphoretic. No erythema.  Psychiatric: She has a normal mood and affect. Her speech is normal and behavior is normal. Cognition and  memory are impaired.    ED Course  Procedures (including critical care time) Labs Review Labs Reviewed  CBC - Abnormal; Notable for the following:    Hemoglobin 10.4 (*)    HCT 33.3 (*)    RDW 16.9 (*)    All other components within normal limits  COMPREHENSIVE METABOLIC PANEL - Abnormal; Notable for the following:    Potassium 3.4 (*)    Glucose, Bld 229 (*)    Creatinine, Ser 1.11 (*)    Total Protein 6.0 (*)    Albumin 3.0 (*)    ALT 10 (*)    GFR calc non Af Amer 46 (*)    GFR calc Af Amer 53 (*)    All other components within normal limits  URINALYSIS, ROUTINE W REFLEX MICROSCOPIC (NOT AT [facility]) - Abnormal; Notable for the following:    APPearance HAZY (*)    Glucose, UA 100 (*)    Hgb urine dipstick LARGE (*)    Protein, ur >300 (*)    All other components within normal limits  URINE MICROSCOPIC-ADD ON - Abnormal; Notable for the following:    Squamous  Epithelial / LPF 0-5 (*)    Bacteria, UA RARE (*)    Casts HYALINE CASTS (*)    All other components within normal limits  I-STAT CHEM 8, ED - Abnormal; Notable for the following:    Potassium 3.4 (*)    Chloride 99 (*)    Creatinine, Ser 1.10 (*)    Glucose, Bld 224 (*)    All other components within normal limits  I-STAT CG4 LACTIC ACID, ED - Abnormal; Notable for the following:    Lactic Acid, Venous 3.33 (*)    All other components within normal limits  URINE CULTURE  PROTIME-INR  APTT  DIFFERENTIAL  BLOOD GAS, ARTERIAL  I-STAT TROPOININ, ED  I-STAT CG4 LACTIC ACID, ED  CBG MONITORING, ED    Imaging Review Ct Head Wo Contrast  09/15/2015  CLINICAL DATA:  81 year old hypertensive female recently discharged from hospital for treatment of pneumonia. Presenting with aphasia and confusion. Initial encounter. EXAM: CT HEAD WITHOUT CONTRAST TECHNIQUE: Contiguous axial images were obtained from the base of the skull through the vertex without intravenous contrast. COMPARISON:  05/02/2008. FINDINGS: Slightly dense appearance of the basilar artery/basilar tip which appears different than the prior CT and in the proper clinical setting, thrombosis could not be excluded. Alternatively, this may be related to atherosclerotic changes. Otherwise no CT evidence of large acute infarct. Moderate small vessel disease type changes. No intracranial hemorrhage. Global atrophy without hydrocephalus. No intracranial mass lesion noted on this unenhanced exam. Vascular calcifications. Mastoid air cells, middle ear cavities and visualized paranasal sinuses are clear. Post lens replacement.  Exophthalmos. IMPRESSION: Slightly dense appearance of the basilar artery/basilar tip which appears different than the prior CT and in the proper clinical setting, thrombosis could not be excluded. Alternatively, this may be related to atherosclerotic changes. Otherwise no CT evidence of large acute infarct. Moderate small  vessel disease type changes. No intracranial hemorrhage. Global atrophy without hydrocephalus. Electronically Signed   By: Genia Del M.D.   On: 09/15/2015 13:46   Dg Chest Portable 1 View  09/15/2015  CLINICAL DATA:  Altered mental status, recent pneumonia EXAM: PORTABLE CHEST 1 VIEW COMPARISON:  09/05/2015 FINDINGS: Cardiomegaly again noted. Status post CABG. There is small left pleural effusion with left basilar atelectasis or infiltrate. No pulmonary edema. IMPRESSION: Small left pleural effusion left basilar atelectasis or infiltrate. No  pulmonary edema. Electronically Signed   By: Lahoma Crocker M.D.   On: 09/15/2015 13:29   I have personally reviewed and evaluated these images and lab results as part of my medical decision-making.   EKG Interpretation   Date/Time:  Monday September 15 2015 13:03:34 EST Ventricular Rate:  70 PR Interval:  219 QRS Duration: 96 QT Interval:  457 QTC Calculation: 493 R Axis:   93 Text Interpretation:  Sinus rhythm Multiple premature complexes, vent &  supraven Borderline prolonged PR interval Right axis deviation Confirmed  by Alvino Chapel  MD, NATHAN [phone]) on 09/15/2015 1:47:36 PM      MDM   Final diagnoses:  Altered mental status, unspecified altered mental status type    81 year old Caucasian female past medical history diabetes, hypertension, coronary artery disease, recent diagnosis of pneumonia currently on antibiotics presents in the setting of altered mental status. Per family patient was seen normal last night and on evaluation today at 10:00 patient appeared to be acting appropriately. However she did answer some questions oddly. On reassessment shortly after patient was found to be unable to use glucometer which she uses daily. She additionally did not know the name of her daughter who is been taking care of her. Considering these findings she was brought to the emergency department. On arrival patient was hypertensive and alert. She was not  oriented to time but was oriented to place and self. She knew that her daughter was her daughter but did not know her name. Patient had no obvious focal neurologic deficit. She was unable to do finger to nose or any other request which was more than one step. Due to last known normal last night because at stroke was not initiated however will obtain CT head and consult neurology. Will obtain laboratory analysis to rule out infectious etiology of altered mental status. Patient currently able to maintain airway and does not require acute intervention at this time.  CT had reveals no obvious abnormality however is small area concerning for possible abnormality. Considering this finding neurology recommended inpatient MRI. Additionally patient found to have lactic acidosis of unknown origin. Chest x-ray revealed worsening effusion on the left side. I personally reviewed imaging. UA without signs of infection. No signs of anemia or other electrolyte abnormality that would cause patient's altered mental status. At this time due to continued altered mental status of unknown origin patient will be admitted to medicine for further evaluation. Patient given fluids and setting of lactic acidosis.  Patient stable at time of transfer care to medicine. Family updated on plan.  Attending has seen and evaluated patient and Dr. Alvino Chapel is in agreement with plan.   Esaw Grandchild, MD 09/15/15 1627  Davonna Belling, MD 09/17/15 2217

## 2015-09-15 NOTE — ED Notes (Addendum)
Updated pt. And pt.s family  On plan of care.

## 2015-09-16 ENCOUNTER — Ambulatory Visit: Payer: Self-pay | Admitting: Family Medicine

## 2015-09-16 ENCOUNTER — Encounter (HOSPITAL_COMMUNITY): Payer: Self-pay

## 2015-09-16 DIAGNOSIS — J90 Pleural effusion, not elsewhere classified: Secondary | ICD-10-CM | POA: Diagnosis present

## 2015-09-16 DIAGNOSIS — E038 Other specified hypothyroidism: Secondary | ICD-10-CM

## 2015-09-16 DIAGNOSIS — D509 Iron deficiency anemia, unspecified: Secondary | ICD-10-CM

## 2015-09-16 DIAGNOSIS — R4701 Aphasia: Secondary | ICD-10-CM

## 2015-09-16 DIAGNOSIS — E0840 Diabetes mellitus due to underlying condition with diabetic neuropathy, unspecified: Secondary | ICD-10-CM

## 2015-09-16 DIAGNOSIS — E7800 Pure hypercholesterolemia, unspecified: Secondary | ICD-10-CM

## 2015-09-16 DIAGNOSIS — R4182 Altered mental status, unspecified: Secondary | ICD-10-CM

## 2015-09-16 DIAGNOSIS — J948 Other specified pleural conditions: Secondary | ICD-10-CM

## 2015-09-16 LAB — CBC WITH DIFFERENTIAL/PLATELET
BASOS ABS: 0.1 10*3/uL (ref 0.0–0.1)
BASOS PCT: 1 %
EOS ABS: 0.1 10*3/uL (ref 0.0–0.7)
EOS PCT: 1 %
HCT: 28.9 % — ABNORMAL LOW (ref 36.0–46.0)
Hemoglobin: 9.1 g/dL — ABNORMAL LOW (ref 12.0–15.0)
Lymphocytes Relative: 14 %
Lymphs Abs: 1 10*3/uL (ref 0.7–4.0)
MCH: 26.1 pg (ref 26.0–34.0)
MCHC: 31.5 g/dL (ref 30.0–36.0)
MCV: 82.8 fL (ref 78.0–100.0)
Monocytes Absolute: 0.8 10*3/uL (ref 0.1–1.0)
Monocytes Relative: 11 %
Neutro Abs: 5.2 10*3/uL (ref 1.7–7.7)
Neutrophils Relative %: 73 %
PLATELETS: 156 10*3/uL (ref 150–400)
RBC: 3.49 MIL/uL — AB (ref 3.87–5.11)
RDW: 17.1 % — ABNORMAL HIGH (ref 11.5–15.5)
WBC: 6.9 10*3/uL (ref 4.0–10.5)

## 2015-09-16 LAB — LIPID PANEL
CHOL/HDL RATIO: 7.7 ratio
CHOLESTEROL: 208 mg/dL — AB (ref 0–200)
HDL: 27 mg/dL — ABNORMAL LOW (ref >40)
LDL Cholesterol: 140 mg/dL — ABNORMAL HIGH (ref 0–99)
TRIGLYCERIDES: 204 mg/dL — AB (ref <150)
VLDL: 41 mg/dL — AB (ref 0–40)

## 2015-09-16 LAB — GLUCOSE, CAPILLARY
GLUCOSE-CAPILLARY: 73 mg/dL (ref 65–99)
GLUCOSE-CAPILLARY: 90 mg/dL (ref 65–99)
GLUCOSE-CAPILLARY: 122 mg/dL — AB (ref 65–99)
Glucose-Capillary: 94 mg/dL (ref 65–99)
Glucose-Capillary: 92 mg/dL (ref 65–99)
Glucose-Capillary: 93 mg/dL (ref 65–99)
Glucose-Capillary: 121 mg/dL — ABNORMAL HIGH (ref 65–99)
Glucose-Capillary: 249 mg/dL — ABNORMAL HIGH (ref 65–99)

## 2015-09-16 LAB — BRAIN NATRIURETIC PEPTIDE: B Natriuretic Peptide: 572.6 pg/mL — ABNORMAL HIGH (ref 0.0–100.0)

## 2015-09-16 LAB — COMPREHENSIVE METABOLIC PANEL
ALT: 10 U/L — AB (ref 14–54)
AST: 15 U/L (ref 15–41)
Albumin: 2.6 g/dL — ABNORMAL LOW (ref 3.5–5.0)
Alkaline Phosphatase: 75 U/L (ref 38–126)
Anion gap: 7 (ref 5–15)
BILIRUBIN TOTAL: 0.6 mg/dL (ref 0.3–1.2)
BUN: 16 mg/dL (ref 6–20)
CO2: 25 mmol/L (ref 22–32)
CREATININE: 1.14 mg/dL — AB (ref 0.44–1.00)
Calcium: 8.3 mg/dL — ABNORMAL LOW (ref 8.9–10.3)
Chloride: 106 mmol/L (ref 101–111)
GFR calc Af Amer: 51 mL/min — ABNORMAL LOW (ref >60)
GFR, EST NON AFRICAN AMERICAN: 44 mL/min — AB (ref >60)
Glucose, Bld: 92 mg/dL (ref 65–99)
POTASSIUM: 3.5 mmol/L (ref 3.5–5.1)
Sodium: 138 mmol/L (ref 135–145)
TOTAL PROTEIN: 5.6 g/dL — AB (ref 6.5–8.1)

## 2015-09-16 LAB — MAGNESIUM: MAGNESIUM: 1.5 mg/dL — AB (ref 1.7–2.4)

## 2015-09-16 LAB — URINE CULTURE: Culture: >=100000

## 2015-09-16 LAB — PHOSPHORUS: Phosphorus: 3.4 mg/dL (ref 2.5–4.6)

## 2015-09-16 LAB — TROPONIN I: Troponin I: 0.03 ng/mL (ref <0.031)

## 2015-09-16 LAB — TSH: TSH: 1.164 u[IU]/mL (ref 0.350–4.500)

## 2015-09-16 LAB — LACTIC ACID, PLASMA
LACTIC ACID, VENOUS: 1.1 mmol/L (ref 0.5–2.0)
Lactic Acid, Venous: 0.9 mmol/L (ref 0.5–2.0)

## 2015-09-16 MED ORDER — ASPIRIN 81 MG PO CHEW
81.0000 mg | CHEWABLE_TABLET | Freq: Every day | ORAL | Status: DC
  Administered 2015-09-17 – 2015-09-19 (×3): 81 mg via ORAL
  Filled 2015-09-16 (×3): qty 1

## 2015-09-16 MED ORDER — IPRATROPIUM-ALBUTEROL 0.5-2.5 (3) MG/3ML IN SOLN: 3.0000 mL | Freq: Four times a day (QID) | RESPIRATORY_TRACT | Status: DC

## 2015-09-16 MED ORDER — SODIUM CHLORIDE 0.9 % IV SOLN
12.5000 mg | Freq: Once | INTRAVENOUS | Status: AC
  Administered 2015-09-16: 12.5 mg via INTRAVENOUS
  Filled 2015-09-16 (×2): qty 0.5

## 2015-09-16 MED ORDER — INSULIN ASPART 100 UNIT/ML ~~LOC~~ SOLN
0.0000 [IU] | SUBCUTANEOUS | Status: DC
  Administered 2015-09-16: 3 [IU] via SUBCUTANEOUS
  Administered 2015-09-17: 2 [IU] via SUBCUTANEOUS
  Administered 2015-09-17: 5 [IU] via SUBCUTANEOUS
  Administered 2015-09-17: 1 [IU] via SUBCUTANEOUS
  Administered 2015-09-17: 3 [IU] via SUBCUTANEOUS
  Administered 2015-09-17: 5 [IU] via SUBCUTANEOUS
  Administered 2015-09-18: 0 [IU] via SUBCUTANEOUS
  Administered 2015-09-18: 2 [IU] via SUBCUTANEOUS

## 2015-09-16 MED ORDER — IPRATROPIUM-ALBUTEROL 0.5-2.5 (3) MG/3ML IN SOLN
3.0000 mL | Freq: Four times a day (QID) | RESPIRATORY_TRACT | Status: DC | PRN
  Filled 2015-09-16: qty 3

## 2015-09-16 MED ORDER — DEXTROSE 5 % IV SOLN
3.0000 g | Freq: Once | INTRAVENOUS | Status: AC
  Administered 2015-09-16: 3 g via INTRAVENOUS
  Filled 2015-09-16: qty 6

## 2015-09-16 MED ORDER — DEXTROSE 5 % IV SOLN
INTRAVENOUS | Status: DC
  Administered 2015-09-16: 04:00:00 via INTRAVENOUS

## 2015-09-16 MED ORDER — PROMETHAZINE HCL 25 MG/ML IJ SOLN
12.5000 mg | Freq: Once | INTRAMUSCULAR | Status: AC
  Administered 2015-09-16: 12.5 mg via INTRAVENOUS
  Filled 2015-09-16: qty 1

## 2015-09-16 MED ORDER — FUROSEMIDE 10 MG/ML IJ SOLN
60.0000 mg | Freq: Once | INTRAMUSCULAR | Status: AC
  Administered 2015-09-16: 60 mg via INTRAVENOUS
  Filled 2015-09-16: qty 6

## 2015-09-16 MED ORDER — METHYLPREDNISOLONE SODIUM SUCC 125 MG IJ SOLR
60.0000 mg | INTRAMUSCULAR | Status: DC
  Administered 2015-09-16: 60 mg via INTRAVENOUS
  Filled 2015-09-16: qty 2

## 2015-09-16 NOTE — Clinical Documentation Improvement (Signed)
Hospitalist  Would you please help clarify medical condition associated with clinical findings of elevated BP of 200/67?  Stroke ruled out.  >    Hypertensive emergency >    Hypertensive urgency >    Essential Hypertension  Other Condition  Cannot Clinically Determine     Please exercise your independent, professional judgment when responding. A specific answer is not anticipated or expected.   Thank You,  [place] [phone]

## 2015-09-16 NOTE — Assessment & Plan Note (Signed)
Unclear cause. Recommended recheck today as well as to move ahead with referral to heme and GI for further eval.

## 2015-09-16 NOTE — Progress Notes (Signed)
Occupational Therapy Evaluation Patient Details Name: [name] MRN: [id number] DOB: [birth year] Today's Date: 09/16/2015    History of Present Illness 81 y.o. female, with a history of coronary artery disease status post CABG, breast cancer, uterine cancer, hypothyroidism, hyperlipidemia and hypertension. She presented  to the emergency department with dry heaves and aphasia. She was admitted to [facility] on 12/9 and discharged on 12/11. She was treated for community-acquired pneumonia and anemia.  nauseated and weak since discharge. CVA ruled out.    Clinical Impression   PTA, pt lived alone and was independent with ADL and mobility. Pt apparently confused and presents with generalized weakness. Pt will need initial 24/7 S after D/C and HHOT. Will follow acutely to address established goals     Follow Up Recommendations  Home health OT;Supervision/Assistance - 24 hour (initially)    Equipment Recommendations  3 in 1 bedside comode    Recommendations for Other Services       Precautions / Restrictions Precautions Precautions: Fall Restrictions Weight Bearing Restrictions: No      Mobility Bed Mobility Overal bed mobility: Needs Assistance Bed Mobility: Supine to Sit     Supine to sit: Min guard;HOB elevated        Transfers Overall transfer level: Needs assistance Equipment used: 1 person hand held assist Transfers: Sit to/from [facility] Sit to Stand: Min assist Stand pivot transfers: Min assist            Balance Overall balance assessment: Needs assistance   Sitting balance-Leahy Scale: Good       Standing balance-Leahy Scale: Poor Standing balance comment: unable to stand without support               High Level Balance Comments: daughter reports being unsteady at baseline due to peripheral neuropathy            ADL Overall ADL's : Needs assistance/impaired     Grooming: Minimal assistance;Sitting    Upper Body Bathing: Minimal assitance   Lower Body Bathing: Moderate assistance;Sit to/from stand   Upper Body Dressing : Moderate assistance   Lower Body Dressing: Moderate assistance;Sit to/from stand   Toilet Transfer: Minimal Print production planner Details (indicate cue type and reason): simulated Toileting- Clothing Manipulation and Hygiene: Minimal assistance       Functional mobility during ADLs: Minimal assistance (stand pivot to chair) General ADL Comments: limited at this time due to generalized weakness and apparent confusion     Vision Vision Assessment?: No apparent visual deficits   Perception     Praxis Praxis Praxis tested?: Within functional limits    Pertinent Vitals/Pain Pain Assessment: No/denies pain     Hand Dominance Right   Extremity/Trunk Assessment Upper Extremity Assessment Upper Extremity Assessment: Generalized weakness   Lower Extremity Assessment Lower Extremity Assessment: Defer to PT evaluation   Cervical / Trunk Assessment Cervical / Trunk Assessment: Normal   Communication Communication Communication: Expressive difficulties (confuses words at times)   Cognition Arousal/Alertness: Lethargic Behavior During Therapy: Flat affect Overall Cognitive Status: Impaired/Different from baseline Area of Impairment: Orientation;Attention;Memory;Following commands;Safety/judgement;Awareness;Problem solving Orientation Level: Disoriented to;Time;Situation Current Attention Level: Sustained Memory: Decreased short-term memory Following Commands: Follows one step commands with increased time Safety/Judgement: Decreased awareness of safety;Decreased awareness of deficits Awareness: Intellectual Problem Solving: Slow processing;Decreased initiation General Comments: daughter reports cognition is slowly improving   General Comments       Exercises       Shoulder Instructions      Home  Living Family/patient expects to be  discharged to:: Private residence Living Arrangements: Alone Available Help at Discharge: Family;Friend(s);Available 24 hours/day (for 2 weeks) Type of Home: Apartment Home Access: Elevator     Home Layout: One level     Bathroom Shower/Tub: Occupational psychologist: Standard Bathroom Accessibility: Yes How Accessible: Accessible via walker Home Equipment: Faith - quad;Shower seat          Prior Functioning/Environment Level of Independence: Independent        Comments: drives    OT Diagnosis: Generalized weakness;Cognitive deficits;Altered mental status   OT Problem List: Decreased strength;Decreased activity tolerance;Impaired balance (sitting and/or standing);Decreased cognition;Decreased safety awareness;Decreased knowledge of use of DME or AE;Cardiopulmonary status limiting activity   OT Treatment/Interventions: Self-care/ADL training;Therapeutic exercise;Energy conservation;DME and/or AE instruction;Therapeutic activities;Patient/family education;Cognitive remediation/compensation;Balance training    OT Goals(Current goals can be found in the care plan section) Acute Rehab OT Goals Patient Stated Goal: none stated OT Goal Formulation: With patient/family Time For Goal Achievement: 09/30/15 Potential to Achieve Goals: Good ADL Goals Pt Will Perform Lower Body Bathing: with supervision;with caregiver independent in assisting;sit to/from stand Pt Will Perform Lower Body Dressing: with supervision;with caregiver independent in assisting;sit to/from stand Pt Will Transfer to Toilet: with supervision;ambulating Pt Will Perform Toileting - Clothing Manipulation and hygiene: with supervision;sit to/from stand Pt Will Perform Tub/Shower Transfer: with min guard assist;ambulating;shower seat;with caregiver independent in assisting Additional ADL Goal #1: Demonstrate anticipatory awreness during ADL task in nondistracting enviornment Additional ADL Goal #2: Demonstrate  ability to complete multistep ADL task without redirection in minimally distracting environment  OT Frequency: Min 2X/week   Barriers to D/C:            Co-evaluation              End of Session Nurse Communication: Mobility status  Activity Tolerance: Patient tolerated treatment well Patient left: in chair;with call bell/phone within reach;with family/visitor present   Time: 0940-1003 OT Time Calculation (min): 23 min Charges:  OT General Charges $OT Visit: 1 Procedure OT Evaluation $Initial OT Evaluation Tier I: 1 Procedure OT Treatments $Self Care/Home Management : 8-22 mins G-Codes:    Elissia Spiewak,HILLARY September 24, 2015, 11:22 AM   Maurie Boettcher, OTR/L  [phone] 09-24-15

## 2015-09-16 NOTE — Progress Notes (Signed)
[name]  [name] [id number] DOB: 05-Sep-1933 DOA: 09/15/2015 PCP: Eliezer Lofts, MD  Admit HPI / Brief Narrative: Fatmah Galas is a 81 y.o.WF PMHx CAD native artery, S/P CABG, Breast Ca S/P Right Mastectomy + chemotherapy 2 years, Uterine Ca, Hypothyroidism, HLD, HTN.   Presents to the emergency department today with dry heaves and aphasia. She was admitted to [facility] on 12/9 and discharged on 12/11. She was treated for community-acquired pneumonia and anemia. She received 2 units of packed red blood cells during her admission. She has been nauseated and weak since discharge. She was discharged on Levaquin but was unable tolerate it, and it was changed to doxycycline on 12/15 by her primary care physician. Reportedly she has been unable to tolerate several of her medications recently including blood pressure, antibiotics, and diabetic medications. So she has not been receiving them. On Saturday the patient began to have increased dry heaving and some vomiting episodes. She was at her normal baseline last night prior to bedtime, but this morning awoke confused. She was unable to work her CBG monitor, or remember her daughter's name. She has difficulty with word finding. She is very lethargic.  In the emergency department her initial lactic acid was 3.33, white count is not elevated, hemoglobin has risen to 12.6, urine is positive for yeast and hemoglobin. Chest x-ray shows a left pleural effusion. 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 This was discussed with radiology who felt that it would not explain her symptoms. Neurology was consults of by the emergency department physician and recommends a stroke workup. The patient will be admitted to stepdown for acute encephalopathy, HCAP and possible stroke.  HPI/Subjective: 12/20  A/O 3 (does not know when), however able to identify her  daughter, follow-up commands.    Assessment/Plan:  Acute encephalopathy Uncertain etiology. Possible HCAP versus neurological deficit need to rule out stroke. Will admit to step down  Aphasia and confusion -Seen by neurology. Will start full dose aspirin. In proceed with stroke workup including MRI/MRA/echo/carotids/serologies/PT/OT/speech  HCAP -Patient with likely inadequately treated for her community-acquired pneumonia as she could not tolerate the oral antibiotics. Not fully convinced patient has HCAP but since LLL obscured by effusion will treat. -Continue vancomycin and cefepime.  -Solu-Medrol 60 mg daily -Flutter valve - DuoNeb QID -Blood cultures pending -Sputum culture pending -Legionella negative -Influenza A/B/H1N1 negative  Left Pleural effusion -Lasix 60 mg 1  CHF? -Troponin pending -BNP -Strict in and out -Daily a.m. weight  Diabetes mellitus Type  -Sensitive SSI -Nothing by mouth  Hypothyroidism -Continue Synthroid 50 g daily -TSH pending  High cholesterol  -Continue atorvastatin 40 mg daily  Renal insufficiency -Creatinine at baseline  Iron deficiency anemia -Received 2 units of packed red blood cells to her last admission. Hemoglobin appears stable.  -PCP/GI to decide if upper endoscopy, enteroscopy and colonoscopy warranted.  Hypomagnesemia -Magnesium IV 3 gm  Refractory nausea and vomiting -Thorazine IV 12.5 mg 1     Code Status: FULL Family Communication: Daughter present at time of exam Disposition Plan: Resolution respiratory distress    Consultants: Dr.Pramod S Sethi stroke team    Procedure/Significant Events: 12/19 CT head without contrast:-Slightly dense appearance of the basilar artery/basilar tip which appears different than the prior CT;thrombosis could not be excluded. Negative acute infarct/hemorrhage  -Global atrophy without hydrocephalus. 12/19 MRI/MRA;-negative infarct-negative significant stenosis  basilar artery  Culture 12/9 Legionella negative 12/19  Influenza A/B/H1N1 negative 12/19 blood left hand 2 NGTD 12/19 MRSA by PCR negative    Antibiotics: cefepime 12/19>> Vancomycin 12/19>>  DVT prophylaxis: Lovenox   Devices    LINES / TUBES:      Continuous Infusions: . dextrose 50 mL/hr at 09/16/15 1900    Objective: VITAL SIGNS: Temp: 99.8 F (37.7 C) (12/20 1920) Temp Source: Oral (12/20 1920) BP: 170/66 mmHg (12/20 1626) Pulse Rate: 70 (12/20 1626) SPO2; FIO2:   Intake/Output Summary (Last 24 hours) at 09/16/15 1949 Last data filed at 09/16/15 1900  Gross per 24 hour  Intake   1746 ml  Output    251 ml  Net   1495 ml     Exam: General: A/O 3 (does not know when), however able to identify her daughter, No acute respiratory distress Eyes: Negative headache, negative scleral hemorrhage ENT: Negative Runny nose, negative gingival bleeding, Neck:  Negative scars, masses, torticollis, lymphadenopathy, JVD Lungs: Clear to auscultation bilaterally without wheezes or crackles Cardiovascular: Regular rate and rhythm without murmur gallop or rub normal S1 and S2 Abdomen:negative abdominal pain, nondistended, positive soft, bowel sounds, no rebound, no ascites, no appreciable mass Extremities: No significant cyanosis, clubbing, or edema bilateral lower extremities Psychiatric:  Negative depression, negative anxiety, negative fatigue, negative mania  Neurologic:  Cranial nerves II through XII intact, tongue/uvula midline, all extremities muscle strength 5/5, sensation intact throughout, negative dysarthria, negative expressive aphasia, negative receptive aphasia.   Data Reviewed: Basic Metabolic Panel:  Recent Labs Lab 09/11/15 1138 09/15/15 1237 09/15/15 1246 09/16/15 1034  NA 137 135 137 138  K 4.0 3.4* 3.4* 3.5  CL 101 102 99* 106  CO2 27 22  --  25  GLUCOSE 274* 229* 224* 92  BUN 14 14 17 16   CREATININE 1.30* 1.11* 1.10* 1.14*  CALCIUM  9.3 8.9  --  8.3*  MG  --   --   --  1.5*  PHOS  --   --   --  3.4   Liver Function Tests:  Recent Labs Lab 09/15/15 1237 09/16/15 1034  AST 17 15  ALT 10* 10*  ALKPHOS 88 75  BILITOT 0.7 0.6  PROT 6.0* 5.6*  ALBUMIN 3.0* 2.6*   No results for input(s): LIPASE, AMYLASE in the last 168 hours. No results for input(s): AMMONIA in the last 168 hours. CBC:  Recent Labs Lab 09/11/15 1138 09/15/15 1237 09/15/15 1246 09/16/15 1034  WBC 8.2 6.0  --  6.9  NEUTROABS 6.2 4.4  --  5.2  HGB 10.6* 10.4* 12.6 9.1*  HCT 34.2* 33.3* 37.0 28.9*  MCV 82.7 83.7  --  82.8  PLT 209.0 167  --  156   Cardiac Enzymes:  Recent Labs Lab 09/16/15 1830  TROPONINI 0.03   BNP (last 3 results)  Recent Labs  09/16/15 1830  BNP 572.6*    ProBNP (last 3 results) No results for input(s): PROBNP in the last 8760 hours.  CBG:  Recent Labs Lab 09/16/15 0559 09/16/15 0840 09/16/15 1139 09/16/15 1338 09/16/15 1625  GLUCAP 94 90 92 93 122*    Recent Results (from the past 240 hour(s))  Urine culture     Status: None   Collection Time: 09/15/15  2:32 PM  Result Value Ref Range Status   Specimen Description URINE, CATHETERIZED  Final   Special Requests NONE  Final   Culture >=100,000 COLONIES/mL YEAST  Final   Report Status 09/16/2015 FINAL  Final  Culture, blood (routine x 2) Call MD  if unable to obtain prior to antibiotics being given     Status: None (Preliminary result)   Collection Time: 09/15/15  9:18 PM  Result Value Ref Range Status   Specimen Description BLOOD LEFT HAND  Final   Special Requests BOTTLES DRAWN AEROBIC AND ANAEROBIC 10CC   Final   Culture NO GROWTH < 24 HOURS  Final   Report Status PENDING  Incomplete  Culture, blood (routine x 2) Call MD if unable to obtain prior to antibiotics being given     Status: None (Preliminary result)   Collection Time: 09/15/15  9:30 PM  Result Value Ref Range Status   Specimen Description BLOOD LEFT HAND  Final   Special  Requests BOTTLES DRAWN AEROBIC AND ANAEROBIC 8CC   Final   Culture NO GROWTH < 24 HOURS  Final   Report Status PENDING  Incomplete  MRSA PCR Screening     Status: None   Collection Time: 09/15/15 10:04 PM  Result Value Ref Range Status   MRSA by PCR NEGATIVE NEGATIVE Final    Comment:        The GeneXpert MRSA Assay (FDA approved for NASAL specimens only), is one component of a comprehensive MRSA colonization surveillance program. It is not intended to diagnose MRSA infection nor to guide or monitor treatment for MRSA infections.      Studies:  Recent x-ray studies have been reviewed in detail by the Attending Physician  Scheduled Meds:  Scheduled Meds: . [START ON 09/17/2015] aspirin  81 mg Oral Daily  . atorvastatin  40 mg Oral Daily  . ceFEPime (MAXIPIME) IV  2 g Intravenous Q24H  . enoxaparin (LOVENOX) injection  40 mg Subcutaneous Q24H  . insulin aspart  0-9 Units Subcutaneous 6 times per day  . levothyroxine  50 mcg Oral QAC breakfast  . methylPREDNISolone (SOLU-MEDROL) injection  60 mg Intravenous Q24H  . sertraline  25 mg Oral Daily  . vancomycin  750 mg Intravenous Q12H  . cyanocobalamin  100 mcg Oral Weekly    Time spent on care of this patient: 40 mins   WOODS, Geraldo Docker , MD  [facility] Office  [phone] Pager - [phone]  On-Call/Text Page:      [email or web address]      password [email or web address]  If 7PM-7AM, please contact night-coverage [email or web address] Password [email or web address] 09/16/2015, 7:49 PM   LOS: 1 day   Care during the described time interval was provided by me .  I have reviewed this patient's available data, including medical history, events of Vaughan, physical examination, and all test results as part of my evaluation. I have personally reviewed and interpreted all radiology studies.   Dia Crawford, MD [phone] Pager

## 2015-09-16 NOTE — Evaluation (Signed)
Clinical/Bedside Swallow Evaluation Patient Details  Name: [name] MRN: [id number] Date of Birth: April 27, 1933  Today's Date: 09/16/2015 Time: SLP Start Time (ACUTE ONLY): 0910 SLP Stop Time (ACUTE ONLY): 0930 SLP Time Calculation (min) (ACUTE ONLY): 20 min  Past Medical History:  Past Medical History  Diagnosis Date  . Cellulitis     R ARM  . Coronary artery disease   . Hypothyroidism   . Hyperlipidemia   . Hypertension    Past Surgical History:  Past Surgical History  Procedure Laterality Date  . Total abdominal hysterectomy  1993  . Mastectomy  1979    right, followed by 2 years chemo  . Total hip arthroplasty  08/2009    right  . Appendectomy    . US echocardiography  05/2009    EF 55-60%,MILD-MOD LVH,, MILD-MOD A STENOSIS,  . Nuclear stress test  07/2009    EF 69%,  MILD ISCHEMIA IN INFEROLATERAL WALL  . Coronary artery bypass graft  2008  . Cardiac catheterization     HPI:  81 y.o. female with new onset expressive and receptive aphasia in the setting of elevated BP. Exam is notable for aphasia with out weakness or sensory deficits. MRI negative.    Assessment / Plan / Recommendation Clinical Impression  Pt demonstrates normal swallow function with thin liquids and puree. Pt is nauseated and bottom dentures not in place, so solids not trialed. Will initiate a full liquid diet. Ok to upgrade diet to regular when pt can tolerate as no impairment with mastication is suspected. SLP will sign off for swallowing. Will f/u tomorrow for cognitive linguistic eval when pt feeling better.     Aspiration Risk  Mild aspiration risk    Diet Recommendation Thin liquid;Regular   Liquid Administration via: Cup;Straw Medication Administration: Whole meds with liquid Supervision: Patient able to self feed Postural Changes: Seated upright at 90 degrees    Other  Recommendations Oral Care Recommendations: Oral care BID   Follow up Recommendations  None    Frequency and  Duration            Prognosis        Swallow Study   General HPI: 81 y.o. female with new onset expressive and receptive aphasia in the setting of elevated BP. Exam is notable for aphasia with out weakness or sensory deficits. MRI negative.  Type of Study: Bedside Swallow Evaluation Previous Swallow Assessment: none Diet Prior to this Study: NPO Temperature Spikes Noted: No Respiratory Status: Room air History of Recent Intubation: No Behavior/Cognition: Alert;Cooperative Oral Cavity Assessment: Within Functional Limits Oral Care Completed by SLP: Yes Oral Cavity - Dentition: Dentures, top;Dentures, not available Vision: Functional for self-feeding Self-Feeding Abilities: Able to feed self Patient Positioning: Upright in bed Baseline Vocal Quality: Low vocal intensity Volitional Cough: Strong Volitional Swallow: Able to elicit    Oral/Motor/Sensory Function Overall Oral Motor/Sensory Function: Within functional limits   Ice Chips     Thin Liquid Thin Liquid: Within functional limits    Nectar Thick     Honey Thick     Puree Puree: Within functional limits   Solid Solid: Not tested      Herbie Baltimore, MA CCC-SLP [id number]  Lynann Beaver 09/16/2015,9:45 AM

## 2015-09-16 NOTE — Assessment & Plan Note (Addendum)
Change antibiotics to doxy x 3 more days. Stop levaquin as may be cause of nausea. Try to encourage fluids.. Ginger-ale, soup, broth, popsicles, ice chips etc.  Needs CXR in 4-6 weeks to assure resolution.

## 2015-09-16 NOTE — Assessment & Plan Note (Signed)
As soon as able to restart BP meds, do so, try to take today . Use zofran for nausea.

## 2015-09-16 NOTE — Progress Notes (Signed)
STROKE TEAM PROGRESS NOTE   HISTORY [name] is an 81 y.o. female who was normal last night 09/14/2015, time unknown. This AM 09/14/2108 daughter noted she was having difficulty following commands, could not make a sentence --"word salad", having difficulty doing normal tasks. She had no difficulty with her strength however. Due to fear she may be hypoglycemic she was brought to the ED. On exam ED MD noted aphasia and possible right field cut and Neurology was called. Patient currently in no distress. Per daughter, she has been N/V for past week and has not been able to keep her ASA down. In addition she had not been able to keep her BP medications down and her BP was in the systolic 99991111 for the past three days. Patient was not administered TPA secondary to being out of window. She was admitted for further evaluation and treatment.   SUBJECTIVE (INTERVAL HISTORY) Patient states that her confusion and disorientation is improving but she is not back to baseline. 2 of her daughters are present at the bedside and states she is a lot better but she she was able to name only one of her 2 daughters. Patient denied headache, slurred speech, focal extremity weakness or numbness. She was recently discharged from the hospital with pneumonia and was on levofloxacin which was changed 4 days ago to doxycycline.   OBJECTIVE Temp:  [98.2 F (36.8 C)-99.8 F (37.7 C)] 99.7 F (37.6 C) (12/20 1334) Pulse Rate:  [59-73] 73 (12/20 0835) Cardiac Rhythm:  [-] Normal sinus rhythm (12/20 0835) Resp:  [9-22] 17 (12/20 0835) BP: (152-223)/(59-108) 152/62 mmHg (12/20 0835) SpO2:  [95 %-100 %] 99 % (12/20 0835)  CBC:   Recent Labs Lab 09/15/15 1237 09/15/15 1246 09/16/15 1034  WBC 6.0  --  6.9  NEUTROABS 4.4  --  5.2  HGB 10.4* 12.6 9.1*  HCT 33.3* 37.0 28.9*  MCV 83.7  --  82.8  PLT 167  --  [id number]    Basic Metabolic Panel:   Recent Labs Lab 09/15/15 1237 09/15/15 1246 09/16/15 1034  NA 135  137 138  K 3.4* 3.4* 3.5  CL 102 99* 106  CO2 22  --  25  GLUCOSE 229* 224* 92  BUN 14 17 16   CREATININE 1.11* 1.10* 1.14*  CALCIUM 8.9  --  8.3*  MG  --   --  1.5*  PHOS  --   --  3.4    Lipid Panel:     Component Value Date/Time   CHOL 208* 09/16/2015 0412   TRIG 204* 09/16/2015 0412   HDL 27* 09/16/2015 0412   CHOLHDL 7.7 09/16/2015 0412   VLDL 41* 09/16/2015 0412   LDLCALC 140* 09/16/2015 0412   HgbA1c:  Lab Results  Component Value Date   HGBA1C 6.8* 09/05/2015   Urine Drug Screen: No results found for: LABOPIA, COCAINSCRNUR, LABBENZ, AMPHETMU, THCU, LABBARB    IMAGING  Ct Head Wo Contrast 09/15/2015   Slightly dense appearance of the basilar artery/basilar tip which appears different than the prior CT and in the proper clinical setting, thrombosis could not be excluded. Alternatively, this may be related to atherosclerotic changes. Otherwise no CT evidence of large acute infarct. Moderate small vessel disease type changes. No intracranial hemorrhage. Global atrophy without hydrocephalus.   Dg Chest Portable 1 View 09/15/2015  Small left pleural effusion left basilar atelectasis or infiltrate. No pulmonary edema.  MRI HEAD  09/15/2015  Exam is motion degraded. No acute infarct. Moderate small vessel  disease type changes. Global atrophy without hydrocephalus.   MRA HEAD  09/15/2015  Exam is motion degraded. No significant stenosis of the basilar artery. Scattered intracranial atherosclerotic type changes       PHYSICAL EXAM Elderly lady not in distress. . Afebrile. Head is nontraumatic. Neck is supple without bruit.    Cardiac exam no murmur or gallop. Lungs are clear to auscultation. Distal pulses are well felt. Neurological Exam ;  Awake  Alert oriented x 2 able to correctly name one of her 2 daughters. Diminished attention, registration and recall Normal speech and language.able to name repeat and comprehend well .eye movements full without nystagmus.fundi  were not visualized. Vision acuity and fields appear normal. Hearing is normal. Palatal movements are normal. Face symmetric. Tongue midline. Normal strength, tone, reflexes and coordination. Normal sensation. Gait deferred. ASSESSMENT/PLAN [name] is a 81 y.o. female with history of coronary artery disease status post CABG, breast cancer, uterine cancer, hypothyroidism, hyperlipidemia and hypertension presenting with expressive aphasia and confusion. She did not receive IV t-PA due to delay in arrival.   Encephalopathy vs seizure  MRI  No acute infarct  MRA  No significant stenosis  Carotid Doppler  pending   2D Echo  pending   Check EEG  LDL 140  HgbA1c 6.8  Lovenox 40 mg sq daily for VTE prophylaxis Diet full liquid Room service appropriate?: Yes; Fluid consistency:: Thin  aspirin 81 mg daily prior to admission, now on aspirin 300 mg suppository daily. Ok to resume aspirin as PTA. Order updated.  Ongoing aggressive stroke risk factor management  Therapy recommendations:  HH OT  Disposition:  pending   Hypertension  Stable  Hyperlipidemia  Home meds:  lipitor 40, resumed in hospital  LDL 140, goal < 70  Continue statin at discharge  Diabetes  CBGs trending down, started on D5W  HgbA1c 6.8, at goal < 7.0  Controlled  Other Stroke Risk Factors  Advanced age  Obesity, Body mass index is 33.83 kg/(m^2).   Coronary artery disease w/ CABG  Suspected HCAP  Vancomycin and cefepime  Other Active Problems  hypothryoid  Hospital day # 1 I have personally examined this patient, reviewed notes, independently viewed imaging studies, participated in medical decision making and plan of care. I have made any additions or clarifications directly to the above note. Agree with note above. She presented with confusion and disorientation and speech difficulties in the setting of recent pneumonia and antibiotics possibly from delirium etiology unclear.  Doubt this represents a stroke but she remains at risk for neurological worsening, stroke, TIA needs ongoing evaluation as well as an EEG.  Antony Contras, MD Medical Director [facility] Pager: [phone] 09/16/2015 2:46 PM    To contact Stroke Continuity provider, please refer to [email or web address] After hours, contact General Neurology

## 2015-09-16 NOTE — Assessment & Plan Note (Signed)
Check fasting blood sugar daily in AM.. Goal < 120. Call if > 200.  Okay to stay of medication for DM until eating better as CBGs not very elevated. Will check CBG in office today as well

## 2015-09-16 NOTE — Evaluation (Signed)
Physical Therapy Evaluation Patient Details Name: [name] MRN: [id number] DOB: [birth year] Today's Date: 09/16/2015   History of Present Illness  pt presents with Encephaolpathy and recent admit for PNA, with N/V from antibiotics.  pt with hx of CAD< CABG, Breast CA, Uterine CA, HTN, and DM.    Clinical Impression  Pt generally weak and limited by overall fatigue and nausea.  Pt at this time is requiring A for all aspects of mobility and pending progress should be able to D/C to home with daughter's providing A.  Will continue to follow.      Follow Up Recommendations Home health PT;Supervision/Assistance - 24 hour    Equipment Recommendations  Rolling walker with 5" wheels    Recommendations for Other Services       Precautions / Restrictions Precautions Precautions: Fall Restrictions Weight Bearing Restrictions: No      Mobility  Bed Mobility Overal bed mobility: Needs Assistance Bed Mobility: Sit to Supine     Supine to sit: Min guard;HOB elevated Sit to supine: Min guard   General bed mobility comments: pt grimacing due to staomach discomfort, but was able to complete without A.    Transfers Overall transfer level: Needs assistance Equipment used: Rolling walker (2 wheeled) Transfers: Sit to/from Stand Sit to Stand: Min assist Stand pivot transfers: Min assist       General transfer comment: cues for UE use and controlling descent to sitting.    Ambulation/Gait Ambulation/Gait assistance: Min assist Ambulation Distance (Feet): 12 Feet Assistive device: Rolling walker (2 wheeled) Gait Pattern/deviations: Step-through pattern;Decreased stride length;Trunk flexed     General Gait Details: pt moves very slowly and needs A for management of RW.  pt c/o fatigue.    Stairs            Wheelchair Mobility    Modified Rankin (Stroke Patients Only)       Balance Overall balance assessment: Needs assistance Sitting-balance support: No upper  extremity supported;Feet supported Sitting balance-Leahy Scale: Good     Standing balance support: Bilateral upper extremity supported;During functional activity Standing balance-Leahy Scale: Poor Standing balance comment: unable to stand without support               High Level Balance Comments: daughter reports being unsteady at baseline due to peripheral neuropathy             Pertinent Vitals/Pain Pain Assessment: Faces Faces Pain Scale: Hurts even more Pain Location: Stomach.  Unclear pain vs [place] related discomfort.   Pain Descriptors / Indicators: Grimacing;Guarding Pain Intervention(s): Monitored during session;Premedicated before session;Repositioned    Home Living Family/patient expects to be discharged to:: Private residence Living Arrangements: Alone Available Help at Discharge: Family;Friend(s);Available 24 hours/day (for 2 weeks) Type of Home: Apartment Home Access: Elevator     Home Layout: One level Home Equipment: Cane - quad;Shower seat      Prior Function Level of Independence: Independent         Comments: drives     Hand Dominance   Dominant Hand: Right    Extremity/Trunk Assessment   Upper Extremity Assessment: Defer to OT evaluation           Lower Extremity Assessment: Generalized weakness      Cervical / Trunk Assessment: Normal  Communication   Communication:  (Needs prompting to verbalize, but is then easily understood.)  Cognition Arousal/Alertness: Lethargic Behavior During Therapy: Flat affect Overall Cognitive Status: Impaired/Different from baseline Area of Impairment: Orientation;Attention;Memory;Following commands;Safety/judgement;Awareness;Problem solving Orientation Level:  Disoriented to;Place;Time;Situation Current Attention Level: Sustained Memory: Decreased short-term memory Following Commands: Follows one step commands with increased time Safety/Judgement: Decreased awareness of safety;Decreased  awareness of deficits Awareness: Intellectual Problem Solving: Slow processing;Decreased initiation General Comments: daughter reports cognition is slowly improving    General Comments      Exercises        Assessment/Plan    PT Assessment Patient needs continued PT services  PT Diagnosis Difficulty walking;Generalized weakness   PT Problem List Decreased strength;Decreased activity tolerance;Decreased balance;Decreased mobility;Decreased cognition;Decreased coordination;Decreased knowledge of use of DME;Pain  PT Treatment Interventions DME instruction;Gait training;Functional mobility training;Therapeutic activities;Therapeutic exercise;Balance training;Patient/family education;Cognitive remediation   PT Goals (Current goals can be found in the Care Plan section) Acute Rehab PT Goals Patient Stated Goal: none stated PT Goal Formulation: With patient Time For Goal Achievement: 09/30/15 Potential to Achieve Goals: Good    Frequency Min 3X/week   Barriers to discharge        Co-evaluation               End of Session Equipment Utilized During Treatment: Gait belt Activity Tolerance: Patient limited by pain Patient left: in bed;with call bell/phone within reach;with family/visitor present Nurse Communication: Mobility status         Time: [id number] PT Time Calculation (min) (ACUTE ONLY): 24 min   Charges:   PT Evaluation $Initial PT Evaluation Tier I: 1 Procedure PT Treatments $Gait Training: 8-22 mins   PT G CodesCatarina Hartshorn, [place] 09/16/2015, 2:03 PM

## 2015-09-17 ENCOUNTER — Inpatient Hospital Stay (HOSPITAL_COMMUNITY): Payer: Medicare Other

## 2015-09-17 ENCOUNTER — Other Ambulatory Visit (HOSPITAL_COMMUNITY): Payer: Self-pay

## 2015-09-17 DIAGNOSIS — G9340 Encephalopathy, unspecified: Secondary | ICD-10-CM

## 2015-09-17 DIAGNOSIS — N189 Chronic kidney disease, unspecified: Secondary | ICD-10-CM

## 2015-09-17 DIAGNOSIS — N179 Acute kidney failure, unspecified: Secondary | ICD-10-CM | POA: Diagnosis present

## 2015-09-17 DIAGNOSIS — E785 Hyperlipidemia, unspecified: Secondary | ICD-10-CM

## 2015-09-17 DIAGNOSIS — I6789 Other cerebrovascular disease: Secondary | ICD-10-CM

## 2015-09-17 LAB — COMPREHENSIVE METABOLIC PANEL
ALBUMIN: 2.6 g/dL — AB (ref 3.5–5.0)
ALT: 11 U/L — ABNORMAL LOW (ref 14–54)
AST: 15 U/L (ref 15–41)
Alkaline Phosphatase: 79 U/L (ref 38–126)
Anion gap: 11 (ref 5–15)
BUN: 23 mg/dL — AB (ref 6–20)
CHLORIDE: 100 mmol/L — AB (ref 101–111)
CO2: 21 mmol/L — ABNORMAL LOW (ref 22–32)
Calcium: 8.9 mg/dL (ref 8.9–10.3)
Creatinine, Ser: 1.59 mg/dL — ABNORMAL HIGH (ref 0.44–1.00)
GFR calc Af Amer: 34 mL/min — ABNORMAL LOW (ref >60)
GFR, EST NON AFRICAN AMERICAN: 30 mL/min — AB (ref >60)
Glucose, Bld: 311 mg/dL — ABNORMAL HIGH (ref 65–99)
POTASSIUM: 3.8 mmol/L (ref 3.5–5.1)
Sodium: 132 mmol/L — ABNORMAL LOW (ref 135–145)
Total Bilirubin: 0.6 mg/dL (ref 0.3–1.2)
Total Protein: 6.1 g/dL — ABNORMAL LOW (ref 6.5–8.1)

## 2015-09-17 LAB — CBC WITH DIFFERENTIAL/PLATELET
BASOS PCT: 0 %
Basophils Absolute: 0 10*3/uL (ref 0.0–0.1)
Eosinophils Absolute: 0 10*3/uL (ref 0.0–0.7)
Eosinophils Relative: 0 %
HEMATOCRIT: 32.1 % — AB (ref 36.0–46.0)
HEMOGLOBIN: 10.3 g/dL — AB (ref 12.0–15.0)
LYMPHS ABS: 0.7 10*3/uL (ref 0.7–4.0)
LYMPHS PCT: 8 %
MCH: 26.3 pg (ref 26.0–34.0)
MCHC: 32.1 g/dL (ref 30.0–36.0)
MCV: 82.1 fL (ref 78.0–100.0)
MONO ABS: 0.4 10*3/uL (ref 0.1–1.0)
MONOS PCT: 4 %
Neutro Abs: 7.5 10*3/uL (ref 1.7–7.7)
Neutrophils Relative %: 88 %
Platelets: 180 10*3/uL (ref 150–400)
RBC: 3.91 MIL/uL (ref 3.87–5.11)
RDW: 17.2 % — AB (ref 11.5–15.5)
WBC: 8.6 10*3/uL (ref 4.0–10.5)

## 2015-09-17 LAB — GLUCOSE, CAPILLARY
GLUCOSE-CAPILLARY: 186 mg/dL — AB (ref 65–99)
GLUCOSE-CAPILLARY: 207 mg/dL — AB (ref 65–99)
GLUCOSE-CAPILLARY: 296 mg/dL — AB (ref 65–99)
Glucose-Capillary: 295 mg/dL — ABNORMAL HIGH (ref 65–99)
Glucose-Capillary: 70 mg/dL (ref 65–99)
Glucose-Capillary: 148 mg/dL — ABNORMAL HIGH (ref 65–99)

## 2015-09-17 LAB — HEMOGLOBIN A1C
HEMOGLOBIN A1C: 6.2 % — AB (ref 4.8–5.6)
Mean Plasma Glucose: 131 mg/dL

## 2015-09-17 LAB — MAGNESIUM: Magnesium: 2.4 mg/dL (ref 1.7–2.4)

## 2015-09-17 LAB — PROCALCITONIN: Procalcitonin: 0.25 ng/mL

## 2015-09-17 MED ORDER — SODIUM CHLORIDE 0.9 % IV SOLN
INTRAVENOUS | Status: DC
  Administered 2015-09-17: 18:00:00 via INTRAVENOUS
  Administered 2015-09-19: 75 mL/h via INTRAVENOUS

## 2015-09-17 MED ORDER — ATORVASTATIN CALCIUM 80 MG PO TABS
80.0000 mg | ORAL_TABLET | Freq: Every day | ORAL | Status: DC
  Administered 2015-09-18 – 2015-09-19 (×2): 80 mg via ORAL
  Filled 2015-09-17 (×2): qty 1

## 2015-09-17 MED ORDER — METHYLPREDNISOLONE SODIUM SUCC 40 MG IJ SOLR
40.0000 mg | INTRAMUSCULAR | Status: AC
  Administered 2015-09-17: 40 mg via INTRAVENOUS
  Filled 2015-09-17: qty 1

## 2015-09-17 MED ORDER — PREDNISONE 20 MG PO TABS
40.0000 mg | ORAL_TABLET | Freq: Every day | ORAL | Status: AC
  Administered 2015-09-18: 40 mg via ORAL
  Filled 2015-09-17: qty 2

## 2015-09-17 MED ORDER — PREDNISONE 20 MG PO TABS: 20.0000 mg | ORAL_TABLET | Freq: Every day | ORAL | Status: DC

## 2015-09-17 NOTE — Progress Notes (Signed)
STROKE TEAM PROGRESS NOTE   HISTORY [name] is an 81 y.o. female who was normal last night 09/14/2015, time unknown. This AM 09/14/2108 daughter noted she was having difficulty following commands, could not make a sentence --"word salad", having difficulty doing normal tasks. She had no difficulty with her strength however. Due to fear she may be hypoglycemic she was brought to the ED. On exam ED MD noted aphasia and possible right field cut and Neurology was called. Patient currently in no distress. Per daughter, she has been N/V for past week and has not been able to keep her ASA down. In addition she had not been able to keep her BP medications down and her BP was in the systolic [id number] for the past three days. Patient was not administered TPA secondary to being out of window. She was admitted for further evaluation and treatment.   SUBJECTIVE (INTERVAL HISTORY) Patient states that her confusion and disorientation is improving but she is not back to baseline. 2 of her daughters are present at the bedside and states she is a lot better. Carotid Doppler showing moderate asymmetric right ICA stenosis and mild left ICA stenosis. 2-D echo was done but results are pending  OBJECTIVE Temp:  [97.4 F (36.3 C)-99.8 F (37.7 C)] 98.4 F (36.9 C) (12/21 1210) Pulse Rate:  [62-82] 76 (12/21 1210) Cardiac Rhythm:  [-] Normal sinus rhythm (12/21 0732) Resp:  [0-27] 13 (12/21 1210) BP: (107-174)/(51-66) 143/56 mmHg (12/21 1210) SpO2:  [94 %-98 %] 97 % (12/21 1210) Weight:  [186 lb 8.2 oz (84.6 kg)] 186 lb 8.2 oz (84.6 kg) (12/21 0424)  CBC:   Recent Labs Lab 09/16/15 1034 09/17/15 0334  WBC 6.9 8.6  NEUTROABS 5.2 7.5  HGB 9.1* 10.3*  HCT 28.9* 32.1*  MCV 82.8 82.1  PLT 156 [id number]    Basic Metabolic Panel:   Recent Labs Lab 09/16/15 1034 09/17/15 0334  NA 138 132*  K 3.5 3.8  CL 106 100*  CO2 25 21*  GLUCOSE 92 311*  BUN 16 23*  CREATININE 1.14* 1.59*  CALCIUM 8.3* 8.9  MG  1.5* 2.4  PHOS 3.4  --     Lipid Panel:     Component Value Date/Time   CHOL 208* 09/16/2015 0412   TRIG 204* 09/16/2015 0412   HDL 27* 09/16/2015 0412   CHOLHDL 7.7 09/16/2015 0412   VLDL 41* 09/16/2015 0412   LDLCALC 140* 09/16/2015 0412   HgbA1c:  Lab Results  Component Value Date   HGBA1C 6.2* 09/15/2015   Urine Drug Screen: No results found for: LABOPIA, COCAINSCRNUR, LABBENZ, AMPHETMU, THCU, LABBARB    IMAGING  Ct Head Wo Contrast 09/15/2015   Slightly dense appearance of the basilar artery/basilar tip which appears different than the prior CT and in the proper clinical setting, thrombosis could not be excluded. Alternatively, this may be related to atherosclerotic changes. Otherwise no CT evidence of large acute infarct. Moderate small vessel disease type changes. No intracranial hemorrhage. Global atrophy without hydrocephalus.   Dg Chest Portable 1 View 09/15/2015  Small left pleural effusion left basilar atelectasis or infiltrate. No pulmonary edema.  MRI HEAD  09/15/2015  Exam is motion degraded. No acute infarct. Moderate small vessel disease type changes. Global atrophy without hydrocephalus.   MRA HEAD  09/15/2015  Exam is motion degraded. No significant stenosis of the basilar artery. Scattered intracranial atherosclerotic type changes       PHYSICAL EXAM Elderly lady not in distress. . Afebrile. Head  is nontraumatic. Neck is supple without bruit.    Cardiac exam no murmur or gallop. Lungs are clear to auscultation. Distal pulses are well felt. Neurological Exam ;  Awake  Alert oriented x 2 able to correctly name one of her 2 daughters. Diminished attention, registration and recall Normal speech and language.able to name repeat and comprehend well .eye movements full without nystagmus.fundi were not visualized. Vision acuity and fields appear normal. Hearing is normal. Palatal movements are normal. Face symmetric. Tongue midline. Normal strength, tone,  reflexes and coordination. Normal sensation. Gait deferred. ASSESSMENT/PLAN [name] is a 81 y.o. female with history of coronary artery disease status post CABG, breast cancer, uterine cancer, hypothyroidism, hyperlipidemia and hypertension presenting with expressive aphasia and confusion. She did not receive IV t-PA due to delay in arrival.   Encephalopathy vs seizure  MRI  No acute infarct  MRA  No significant stenosis  Carotid Doppler Right side indicates a 40-59% to 60-79% internal carotid artery stenosis due to peak systolic and end diastolic. Left side indicates 40-59% internal carotid stenosis. Vertebral flow is antegrade bilaterally.    2D Echo  pending   Check EEG  LDL 140  HgbA1c 6.8  Lovenox 40 mg sq daily for VTE prophylaxis Diet heart healthy/carb modified Room service appropriate?: Yes; Fluid consistency:: Thin  aspirin 81 mg daily prior to admission, now on aspirin 300 mg suppository daily. Ok to resume aspirin as PTA. Order updated.  Ongoing aggressive stroke risk factor management  Therapy recommendations:  HH OT  Disposition:  pending   Hypertension  Stable  Hyperlipidemia  Home meds:  lipitor 40, resumed in hospital  LDL 140, goal < 70  Continue statin at discharge  Diabetes  CBGs trending down, started on D5W  HgbA1c 6.8, at goal < 7.0  Controlled  Other Stroke Risk Factors  Advanced age  Obesity, Body mass index is 34.1 kg/(m^2).   Coronary artery disease w/ CABG  Suspected HCAP  Vancomycin and cefepime  Other Active Problems  hypothryoid  Hospital day # 2 I have personally examined this patient, reviewed notes, independently viewed imaging studies, participated in medical decision making and plan of care. I have made any additions or clarifications directly to the above note.  She presented with confusion and disorientation and speech difficulties in the setting of recent pneumonia and antibiotics possibly from  delirium etiology unclear. Doubt this represents a stroke . Follow EEG. Discussed with daughter and patient and answered questions. Antony Contras, MD Medical Director [facility] Pager: [phone] 09/17/2015 3:02 PM    To contact Stroke Continuity provider, please refer to [email or web address] After hours, contact General Neurology

## 2015-09-17 NOTE — Progress Notes (Signed)
Patient with decreased urine output, only 250cc for this shift. Bladder scan shows max of 34 cc in bladder. Fluids were stopped this afternoon. Dr. Lyman Speller paged, he returned the call and gave new orders to restart IV fuilds. Will cont to monitor.

## 2015-09-17 NOTE — Progress Notes (Signed)
Inpatient Diabetes Program Recommendations  AACE/ADA: New Consensus Statement on Inpatient Glycemic Control (2015)  Target Ranges:  Prepandial:   less than 140 mg/dL      Peak postprandial:   less than 180 mg/dL (1-2 hours)      Critically ill patients:  140 - 180 mg/dL  Results for TOTIANNA, POLL (MRN [id number]) as of 09/17/2015 09:10  Ref. Range 09/16/2015 08:40 09/16/2015 11:39 09/16/2015 13:38 09/16/2015 16:25 09/16/2015 19:24 09/16/2015 22:59 09/17/2015 03:55 09/17/2015 08:17  Glucose-Capillary Latest Ref Range: 65-99 mg/dL 90 92 93 122 (H) 121 (H) 249 (H) 296 (H) 295 (H)   Review of Glycemic Control   Current orders for Inpatient glycemic control: Novolog 0-9 units Q4H  Inpatient Diabetes Program Recommendations: Insulin - Basal: If steroids will be continued as ordered (Solumedrol 60 mg Q24H), please consider ordering low dose basal insulin. Recommend starting with Levemir 8 units Q24H starting now. Please note that if basal insulin is ordered as requested, basal insulin will need to be adjusted as steroids are tapered.  Thanks, Barnie Alderman, RN, MSN, CDE Diabetes Coordinator Inpatient Diabetes Program [phone] (Team Pager from [place] to [place]) [phone] ([facility] office) [phone] [facility] office) [phone] [facility] office)

## 2015-09-17 NOTE — Progress Notes (Signed)
PROGRESS NOTE  [name] [id number] DOB: Oct 11, 1932 DOA: 09/15/2015 PCP: Eliezer Lofts, MD  HPI/Recap of past 24 hours: Patient is an 81 year old female with past medical history of diabetes mellitus, hyperlipidemia and hypertension with a recent hospitalization for pneumonia who presented to the emergency room on 12/19 with difficulty word finding, encephalopathy and hypertensive urgency. At that time, lactic acid level elevated and chest x-ray noted mild infiltrate. He was a concern about the possibility of pneumonia causing sepsis or CVA.  Over the next few days, patient improved. Today she is alert and oriented 3 almost fully back at baseline. She still has occasional word finding difficulty. MRI negative for acute CVA. Seen by neurology who are evaluating her for possible TIA. Lactic acid level normalized soon after admission. Patient feeling good today. No complaints. No shortness of breath. No headache. She answers questions appropriately  Assessment/Plan:  Active Problems:   Diabetes mellitus with neurological manifestation [facility]): A1c notes excellent control 6.2. Acute hyperglycemia here in the hospital secondary to steroids as well as D5 given following hypoglycemia on admission.    Hypothyroidism: Continue Synthroid   High cholesterol/hyperlipidemia: Noted increased numbers. Suspect TIA so will increase dose of Lipitor from 40-80    HTN (hypertension) essential with hypertensive urgency: Permissive hypertension especially the setting of a possible TIA. Long-term, family states the patient is probably not well-controlled. We'll make adjustments accordingly.    Iron deficiency anemia   Peripheral neuropathy (HCC) Acute kidney injury: Hydrating. Repeat labs in the morning.    Aphasia/TIA: Symptoms almost fully resolved. Awaiting echocardiogram. Carotid Dopplers note mild stenosis on right. Continue aspirin. Suspect this will be small vessel disease. See above    Altered  mental status/metabolic encephalopathy: Resolving. Suspect secondary to hypertensive urgency versus TIA. EEG pending. As above.   lactic acidosis when infiltrate seen on chest x-ray: Do not think the patient has acute pneumonia or sepsis. Suspect lactic acid level elevated on admission due to hypovolemia from dehydration. Recheck pro-calcitonin level and came back only and 0.9. Patient's white count normal on admission and currently she is 100% on room air. We'll discontinue antibiotics and taper off steroids.  Obesity: Patient meets criteria with BMI greater than 30  Code Status: Full code  Family Communication: Daughters at the bedside   Disposition Plan: Much improved. We'll transfer to floor    Consultants:  Neurology   Procedures:  Carotid Dopplers: Preliminary Right-sided stenosis 40-59 percent to 60-79 percent while left side 40-59 percent  Echocardiogram done 12/21: Results pending  Antibiotics:  IV cefepime and vancomycin 12/19-12/21    Objective: BP 143/56 mmHg  Pulse 76  Temp(Src) 98.5 F (36.9 C) (Oral)  Resp 13  Ht 5\' 2"  (1.575 m)  Wt 84.6 kg (186 lb 8.2 oz)  BMI 34.10 kg/m2  SpO2 97%  Intake/Output Summary (Last 24 hours) at 09/17/15 1548 Last data filed at 09/17/15 1217  Gross per 24 hour  Intake   1801 ml  Output    450 ml  Net   1351 ml   Filed Weights   09/15/15 1309 09/17/15 0345 09/17/15 0424  Weight: 83.915 kg (185 lb) 84.6 kg (186 lb 8.2 oz) 84.6 kg (186 lb 8.2 oz)    Exam:   General:  Alert and oriented 3 , occasionally has word finding issues  Cardiovascular: Regular rate and rhythm, Q000111Q, 2/6 systolic ejection murmur   Respiratory: Clear to auscultation bilaterally   Abdomen: Soft, obese, nontender, positive bowel sounds   Musculoskeletal: No  clubbing or cyanosis, trace edema.  Neuro: No focal deficits, cranial nerves II through XII are intact    Data Reviewed: Basic Metabolic Panel:  Recent Labs Lab 09/11/15 1138  09/15/15 1237 09/15/15 1246 09/16/15 1034 09/17/15 0334  NA 137 135 137 138 132*  K 4.0 3.4* 3.4* 3.5 3.8  CL 101 102 99* 106 100*  CO2 27 22  --  25 21*  GLUCOSE 274* 229* 224* 92 311*  BUN 14 14 17 16  23*  CREATININE 1.30* 1.11* 1.10* 1.14* 1.59*  CALCIUM 9.3 8.9  --  8.3* 8.9  MG  --   --   --  1.5* 2.4  PHOS  --   --   --  3.4  --    Liver Function Tests:  Recent Labs Lab 09/15/15 1237 09/16/15 1034 09/17/15 0334  AST 17 15 15   ALT 10* 10* 11*  ALKPHOS 88 75 79  BILITOT 0.7 0.6 0.6  PROT 6.0* 5.6* 6.1*  ALBUMIN 3.0* 2.6* 2.6*   No results for input(s): LIPASE, AMYLASE in the last 168 hours. No results for input(s): AMMONIA in the last 168 hours. CBC:  Recent Labs Lab 09/11/15 1138 09/15/15 1237 09/15/15 1246 09/16/15 1034 09/17/15 0334  WBC 8.2 6.0  --  6.9 8.6  NEUTROABS 6.2 4.4  --  5.2 7.5  HGB 10.6* 10.4* 12.6 9.1* 10.3*  HCT 34.2* 33.3* 37.0 28.9* 32.1*  MCV 82.7 83.7  --  82.8 82.1  PLT 209.0 167  --  156 180   Cardiac Enzymes:    Recent Labs Lab 09/16/15 1830  TROPONINI 0.03   BNP (last 3 results)  Recent Labs  09/16/15 1830  BNP 572.6*    ProBNP (last 3 results) No results for input(s): PROBNP in the last 8760 hours.  CBG:  Recent Labs Lab 09/16/15 1924 09/16/15 2259 09/17/15 0355 09/17/15 0817 09/17/15 1207  GLUCAP 121* 249* 296* 295* 207*    Recent Results (from the past 240 hour(s))  Urine culture     Status: None   Collection Time: 09/15/15  2:32 PM  Result Value Ref Range Status   Specimen Description URINE, CATHETERIZED  Final   Special Requests NONE  Final   Culture >=100,000 COLONIES/mL YEAST  Final   Report Status 09/16/2015 FINAL  Final  Culture, blood (routine x 2) Call MD if unable to obtain prior to antibiotics being given     Status: None (Preliminary result)   Collection Time: 09/15/15  9:18 PM  Result Value Ref Range Status   Specimen Description BLOOD LEFT HAND  Final   Special Requests BOTTLES  DRAWN AEROBIC AND ANAEROBIC 10CC   Final   Culture NO GROWTH 2 DAYS  Final   Report Status PENDING  Incomplete  Culture, blood (routine x 2) Call MD if unable to obtain prior to antibiotics being given     Status: None (Preliminary result)   Collection Time: 09/15/15  9:30 PM  Result Value Ref Range Status   Specimen Description BLOOD LEFT HAND  Final   Special Requests BOTTLES DRAWN AEROBIC AND ANAEROBIC 8CC   Final   Culture NO GROWTH 2 DAYS  Final   Report Status PENDING  Incomplete  MRSA PCR Screening     Status: None   Collection Time: 09/15/15 10:04 PM  Result Value Ref Range Status   MRSA by PCR NEGATIVE NEGATIVE Final    Comment:        The GeneXpert MRSA Assay (FDA approved for NASAL  specimens only), is one component of a comprehensive MRSA colonization surveillance program. It is not intended to diagnose MRSA infection nor to guide or monitor treatment for MRSA infections.      Studies: No results found.  Scheduled Meds: . aspirin  81 mg Oral Daily  . [START ON 09/18/2015] atorvastatin  80 mg Oral Daily  . enoxaparin (LOVENOX) injection  40 mg Subcutaneous Q24H  . insulin aspart  0-9 Units Subcutaneous 6 times per day  . levothyroxine  50 mcg Oral QAC breakfast  . methylPREDNISolone (SOLU-MEDROL) injection  40 mg Intravenous Q24H  . [START ON 09/19/2015] predniSONE  20 mg Oral Q breakfast  . [START ON 09/18/2015] predniSONE  40 mg Oral Q breakfast  . sertraline  25 mg Oral Daily  . cyanocobalamin  100 mcg Oral Weekly    Continuous Infusions:    Time spent: 35 minutes   [facility] Pager [phone] . If 7PM-7AM, please contact night-coverage at [email or web address], password [facility] 09/17/2015, 3:48 PM  LOS: 2 days

## 2015-09-17 NOTE — Progress Notes (Signed)
Echocardiogram 2D Echocardiogram has been performed.  [name] 09/17/2015, 9:57 AM

## 2015-09-17 NOTE — Progress Notes (Signed)
Patient arrived to 60M03. VSS, denies any pain, oriented x3. Patient oriented to room, unit, staff. Report given to oncoming RN.

## 2015-09-17 NOTE — Procedures (Signed)
History: 81 year old female presenting with delirium  Sedation: None  Technique: This is a 21 channel routine scalp EEG performed at the bedside with bipolar and monopolar montages arranged in accordance to the international 10/20 system of electrode placement. One channel was dedicated to EKG recording.    Background: The background consists predominantly of theta and delta activity. There is a posterior dominant rhythm of 8 Hz seen. The slower activity is irregular and generalized in nature. No sleep is recorded.   Photic stimulation: Physiologic driving is now performed  EEG Abnormalities: 1) generalized irregular slow activity  Clinical Interpretation: This EEG is consistent with a generalized non-specific cerebral dysfunction(encephalopathy). There was no seizure or seizure predisposition recorded on this study. Please note that a normal EEG does not preclude the possibility of epilepsy.   Roland Rack, MD [facility] [phone]  If 7pm- 7am, please page neurology on call as listed in [place].

## 2015-09-17 NOTE — Progress Notes (Signed)
VASCULAR LAB PRELIMINARY  PRELIMINARY  PRELIMINARY  PRELIMINARY Carotid duplex completed  Right side indicates a 40-59% to 60-79%  internal carotid artery stenosis due to peak systolic and end diastolic. Left side indicates 40-59% internal carotid stenosis. Vertebral flow is antegrade bilaterally.   Janifer Adie, RVT, RDMS 09/17/2015, 9:23 AM         Janifer Adie, RVT, RDMS 09/17/2015, 9:23 AM

## 2015-09-17 NOTE — Progress Notes (Signed)
Patient Name: [name] Date of Encounter: 09/17/2015     Principal Problem:   Acute encephalopathy Active Problems:   Diabetes mellitus with neurological manifestation (HCC)   HTN (hypertension)   Hypothyroidism   High cholesterol   Iron deficiency anemia   Peripheral neuropathy (HCC)   Aphasia   Pleural effusion on left   AKI (acute kidney injury) ([place])   Hyperlipidemia LDL goal <70    SUBJECTIVE  The patient feels much better today. Alert. No chest pain or dyspnea. Rhythm NSR with PACs.  CURRENT MEDS . aspirin  81 mg Oral Daily  . [START ON 09/18/2015] atorvastatin  80 mg Oral Daily  . enoxaparin (LOVENOX) injection  40 mg Subcutaneous Q24H  . insulin aspart  0-9 Units Subcutaneous 6 times per day  . levothyroxine  50 mcg Oral QAC breakfast  . methylPREDNISolone (SOLU-MEDROL) injection  40 mg Intravenous Q24H  . [START ON 09/19/2015] predniSONE  20 mg Oral Q breakfast  . [START ON 09/18/2015] predniSONE  40 mg Oral Q breakfast  . sertraline  25 mg Oral Daily  . cyanocobalamin  100 mcg Oral Weekly    OBJECTIVE  Filed Vitals:   09/17/15 0819 09/17/15 1210 09/17/15 1500 09/17/15 1503  BP: 149/51 143/56  146/114  Pulse: 72 76  83  Temp:  98.4 F (36.9 C) 98.5 F (36.9 C)   TempSrc:  Oral Oral   Resp:  13  18  Height:      Weight:      SpO2:  97%  96%    Intake/Output Summary (Last 24 hours) at 09/17/15 1701 Last data filed at 09/17/15 1217  Gross per 24 hour  Intake   1801 ml  Output    450 ml  Net   1351 ml   Filed Weights   09/15/15 1309 09/17/15 0345 09/17/15 0424  Weight: 185 lb (83.915 kg) 186 lb 8.2 oz (84.6 kg) 186 lb 8.2 oz (84.6 kg)    PHYSICAL EXAM  General: Pleasant, NAD. Neuro: Alert and oriented X 3. Moves all extremities spontaneously. Psych: Normal affect. HEENT:  Normal  Neck: Supple without bruits or JVD. Lungs:  Resp regular and unlabored, CTA. Heart: RRR no s3, s4,.  Soft systolic ejection murmur at base. Abdomen:  Soft, non-tender, non-distended, BS + x 4.  Extremities: No clubbing, cyanosis or edema. DP/PT/Radials 2+ and equal bilaterally.  Accessory Clinical Findings  CBC  Recent Labs  09/16/15 1034 09/17/15 0334  WBC 6.9 8.6  NEUTROABS 5.2 7.5  HGB 9.1* 10.3*  HCT 28.9* 32.1*  MCV 82.8 82.1  PLT 156 99991111   Basic Metabolic Panel  Recent Labs  09/16/15 1034 09/17/15 0334  NA 138 132*  K 3.5 3.8  CL 106 100*  CO2 25 21*  GLUCOSE 92 311*  BUN 16 23*  CREATININE 1.14* 1.59*  CALCIUM 8.3* 8.9  MG 1.5* 2.4  PHOS 3.4  --    Liver Function Tests  Recent Labs  09/16/15 1034 09/17/15 0334  AST 15 15  ALT 10* 11*  ALKPHOS 75 79  BILITOT 0.6 0.6  PROT 5.6* 6.1*  ALBUMIN 2.6* 2.6*   No results for input(s): LIPASE, AMYLASE in the last 72 hours. Cardiac Enzymes  Recent Labs  09/16/15 1830  TROPONINI 0.03   BNP Invalid input(s): POCBNP D-Dimer No results for input(s): DDIMER in the last 72 hours. Hemoglobin A1C  Recent Labs  09/15/15 2130  HGBA1C 6.2*   Fasting Lipid Panel  Recent Labs  09/16/15 0412  CHOL 208*  HDL 27*  LDLCALC 140*  TRIG 204*  CHOLHDL 7.7   Thyroid Function Tests  Recent Labs  09/16/15 2142  TSH 1.164    TELE  NSR. PACs.  ECG  2D Echo; - Left ventricle: The cavity size was normal. Wall thickness was increased in a pattern of mild LVH. Systolic function was normal. The estimated ejection fraction was in the range of 60% to 65%. Wall motion was normal; there were no regional wall motion abnormalities. Features are consistent with a pseudonormal left ventricular filling pattern, with concomitant abnormal relaxation and increased filling pressure (grade 2 diastolic dysfunction). - Aortic valve: There was very mild stenosis. Valve area (VTI): 1.93 cm^2. Valve area (Vmax): 1.8 cm^2. Valve area (Vmean): 1.76 cm^2. - Mitral valve: Calcified annulus. - Left atrium: The atrium was mildly  dilated.  Radiology/Studies  Dg Chest 2 View  09/05/2015  CLINICAL DATA:  Shortness of breath for 1 week EXAM: CHEST  2 VIEW COMPARISON:  October 11, 2014 FINDINGS: There is consolidation in the lateral and posterior left base regions with small left effusion. Lungs elsewhere clear. Heart is mildly enlarged with pulmonary vascularity within normal limits. Patient is status post coronary artery bypass grafting. No adenopathy. There is a hiatal hernia present. There is atherosclerotic change throughout the aorta. No bone lesions. IMPRESSION: Infiltrate left base with small left effusion. Moderate hiatal hernia, stable. Stable cardiomegaly. Followup PA and lateral chest radiographs recommended in 3-4 weeks following trial of antibiotic therapy to ensure resolution and exclude underlying malignancy. Electronically Signed   By: Lowella Grip III M.D.   On: 09/05/2015 13:41   Ct Head Wo Contrast  09/15/2015  CLINICAL DATA:  81 year old hypertensive female recently discharged from hospital for treatment of pneumonia. Presenting with aphasia and confusion. Initial encounter. EXAM: CT HEAD WITHOUT CONTRAST TECHNIQUE: Contiguous axial images were obtained from the base of the skull through the vertex without intravenous contrast. COMPARISON:  05/02/2008. FINDINGS: Slightly dense appearance of the basilar artery/basilar tip which appears different than the prior CT and in the proper clinical setting, thrombosis could not be excluded. Alternatively, this may be related to atherosclerotic changes. Otherwise no CT evidence of large acute infarct. Moderate small vessel disease type changes. No intracranial hemorrhage. Global atrophy without hydrocephalus. No intracranial mass lesion noted on this unenhanced exam. Vascular calcifications. Mastoid air cells, middle ear cavities and visualized paranasal sinuses are clear. Post lens replacement.  Exophthalmos. IMPRESSION: Slightly dense appearance of the basilar  artery/basilar tip which appears different than the prior CT and in the proper clinical setting, thrombosis could not be excluded. Alternatively, this may be related to atherosclerotic changes. Otherwise no CT evidence of large acute infarct. Moderate small vessel disease type changes. No intracranial hemorrhage. Global atrophy without hydrocephalus. Electronically Signed   By: Genia Del M.D.   On: 09/15/2015 13:46   Mr Brain Wo Contrast  09/15/2015  CLINICAL DATA:  81 year old hypertensive female with aphasia and confusion. Subsequent encounter. EXAM: MRI HEAD WITHOUT CONTRAST MRA HEAD WITHOUT CONTRAST TECHNIQUE: Multiplanar, multiecho pulse sequences of the brain and surrounding structures were obtained without intravenous contrast. Angiographic images of the head were obtained using MRA technique without contrast. COMPARISON:  09/15/2015 CT. FINDINGS: MRI HEAD FINDINGS Exam is motion degraded. No acute infarct or intracranial hemorrhage. Moderate small vessel disease type changes. Global atrophy without hydrocephalus. No intracranial mass lesion noted on this unenhanced exam. Major intracranial vascular structures are patent. Post lens replacement.  Exophthalmos. Partially  empty sella incidentally noted. Cervical medullary junction and pineal region unremarkable. MRA HEAD FINDINGS Exam is motion degraded. Mild to moderate narrowing at the junction of the left internal carotid artery cavernous and supraclinoid segment Mild narrowing right internal carotid artery cavernous and supraclinoid segment. Moderate focal narrowing mid aspect A1 segment right anterior cerebral artery. Mild to moderate narrowing mid to distal M1 segment right middle cerebral artery. Middle cerebral artery branch vessel moderate narrowing and irregularity bilaterally. Mild to slightly moderate narrowing distal right vertebral artery. Mild narrowing distal left vertebral artery. Nonvisualized right posterior inferior cerebellar artery  and left anterior inferior cerebellar artery. No significant stenosis of the basilar artery. Moderate narrowing mid aspect right posterior cerebral artery P2 segment. Mild narrowing distal branches posterior cerebral artery bilaterally. Mild narrowing proximal left posterior cerebral artery. No aneurysm noted. IMPRESSION: MRI HEAD Exam is motion degraded. No acute infarct. Moderate small vessel disease type changes. Global atrophy without hydrocephalus. MRA HEAD FINDINGS Exam is motion degraded. No significant stenosis of the basilar artery. Scattered intracranial atherosclerotic type changes as detailed above. Electronically Signed   By: Genia Del M.D.   On: 09/15/2015 19:19   Dg Chest Portable 1 View  09/15/2015  CLINICAL DATA:  Altered mental status, recent pneumonia EXAM: PORTABLE CHEST 1 VIEW COMPARISON:  09/05/2015 FINDINGS: Cardiomegaly again noted. Status post CABG. There is small left pleural effusion with left basilar atelectasis or infiltrate. No pulmonary edema. IMPRESSION: Small left pleural effusion left basilar atelectasis or infiltrate. No pulmonary edema. Electronically Signed   By: Lahoma Crocker M.D.   On: 09/15/2015 13:29   Mr Jodene Nam Head/brain Wo Cm  09/15/2015  CLINICAL DATA:  81 year old hypertensive female with aphasia and confusion. Subsequent encounter. EXAM: MRI HEAD WITHOUT CONTRAST MRA HEAD WITHOUT CONTRAST TECHNIQUE: Multiplanar, multiecho pulse sequences of the brain and surrounding structures were obtained without intravenous contrast. Angiographic images of the head were obtained using MRA technique without contrast. COMPARISON:  09/15/2015 CT. FINDINGS: MRI HEAD FINDINGS Exam is motion degraded. No acute infarct or intracranial hemorrhage. Moderate small vessel disease type changes. Global atrophy without hydrocephalus. No intracranial mass lesion noted on this unenhanced exam. Major intracranial vascular structures are patent. Post lens replacement.  Exophthalmos. Partially  empty sella incidentally noted. Cervical medullary junction and pineal region unremarkable. MRA HEAD FINDINGS Exam is motion degraded. Mild to moderate narrowing at the junction of the left internal carotid artery cavernous and supraclinoid segment Mild narrowing right internal carotid artery cavernous and supraclinoid segment. Moderate focal narrowing mid aspect A1 segment right anterior cerebral artery. Mild to moderate narrowing mid to distal M1 segment right middle cerebral artery. Middle cerebral artery branch vessel moderate narrowing and irregularity bilaterally. Mild to slightly moderate narrowing distal right vertebral artery. Mild narrowing distal left vertebral artery. Nonvisualized right posterior inferior cerebellar artery and left anterior inferior cerebellar artery. No significant stenosis of the basilar artery. Moderate narrowing mid aspect right posterior cerebral artery P2 segment. Mild narrowing distal branches posterior cerebral artery bilaterally. Mild narrowing proximal left posterior cerebral artery. No aneurysm noted. IMPRESSION: MRI HEAD Exam is motion degraded. No acute infarct. Moderate small vessel disease type changes. Global atrophy without hydrocephalus. MRA HEAD FINDINGS Exam is motion degraded. No significant stenosis of the basilar artery. Scattered intracranial atherosclerotic type changes as detailed above. Electronically Signed   By: Genia Del M.D.   On: 09/15/2015 19:19    ASSESSMENT AND PLAN 1.  Ischemic heart disease, S/P CABG (Dr Cyndia Bent). No recent angina 2.  Hypertension  with hypertensive urgency, resolving 3. Diabetes mellitus with peripheral neuropathy. 4. Renal insufficiency 5. Hypercholesterolemia. Now on higher dose atorvastatin.   Plan: Agree with current therapy.  Signed, Warren Danes MD

## 2015-09-17 NOTE — Progress Notes (Signed)
EEG Completed; Results Pending  

## 2015-09-18 ENCOUNTER — Inpatient Hospital Stay (HOSPITAL_COMMUNITY): Payer: Medicare Other

## 2015-09-18 ENCOUNTER — Telehealth: Payer: Self-pay | Admitting: Oncology

## 2015-09-18 LAB — GLUCOSE, CAPILLARY
GLUCOSE-CAPILLARY: 196 mg/dL — AB (ref 65–99)
GLUCOSE-CAPILLARY: 171 mg/dL — AB (ref 65–99)
Glucose-Capillary: 158 mg/dL — ABNORMAL HIGH (ref 65–99)
Glucose-Capillary: 213 mg/dL — ABNORMAL HIGH (ref 65–99)
Glucose-Capillary: 229 mg/dL — ABNORMAL HIGH (ref 65–99)

## 2015-09-18 LAB — BASIC METABOLIC PANEL
ANION GAP: 10 (ref 5–15)
ANION GAP: 11 (ref 5–15)
BUN: 28 mg/dL — ABNORMAL HIGH (ref 6–20)
BUN: 30 mg/dL — AB (ref 6–20)
CALCIUM: 8.3 mg/dL — AB (ref 8.9–10.3)
CHLORIDE: 103 mmol/L (ref 101–111)
CO2: 21 mmol/L — ABNORMAL LOW (ref 22–32)
CO2: 19 mmol/L — ABNORMAL LOW (ref 22–32)
Calcium: 8.4 mg/dL — ABNORMAL LOW (ref 8.9–10.3)
Chloride: 105 mmol/L (ref 101–111)
Creatinine, Ser: 1.47 mg/dL — ABNORMAL HIGH (ref 0.44–1.00)
Creatinine, Ser: 1.33 mg/dL — ABNORMAL HIGH (ref 0.44–1.00)
GFR calc Af Amer: 43 mL/min — ABNORMAL LOW (ref >60)
GFR calc non Af Amer: 32 mL/min — ABNORMAL LOW (ref >60)
GFR calc non Af Amer: 37 mL/min — ABNORMAL LOW (ref >60)
GFR, EST AFRICAN AMERICAN: 38 mL/min — AB (ref >60)
GLUCOSE: 229 mg/dL — AB (ref 65–99)
Glucose, Bld: 191 mg/dL — ABNORMAL HIGH (ref 65–99)
POTASSIUM: 3.7 mmol/L (ref 3.5–5.1)
POTASSIUM: 3.9 mmol/L (ref 3.5–5.1)
SODIUM: 134 mmol/L — AB (ref 135–145)
SODIUM: 135 mmol/L (ref 135–145)

## 2015-09-18 MED ORDER — POTASSIUM CHLORIDE CRYS ER 20 MEQ PO TBCR
40.0000 meq | EXTENDED_RELEASE_TABLET | Freq: Once | ORAL | Status: AC
  Administered 2015-09-18: 40 meq via ORAL
  Filled 2015-09-18: qty 2

## 2015-09-18 MED ORDER — MECLIZINE HCL 12.5 MG PO TABS
25.0000 mg | ORAL_TABLET | Freq: Three times a day (TID) | ORAL | Status: DC
  Administered 2015-09-18: 25 mg via ORAL
  Administered 2015-09-18: 12.5 mg via ORAL
  Administered 2015-09-18 – 2015-09-19 (×3): 25 mg via ORAL
  Filled 2015-09-18 (×6): qty 2

## 2015-09-18 MED ORDER — FLEET ENEMA 7-19 GM/118ML RE ENEM: 1.0000 | ENEMA | Freq: Once | RECTAL | Status: DC

## 2015-09-18 MED ORDER — INSULIN ASPART 100 UNIT/ML ~~LOC~~ SOLN
0.0000 [IU] | Freq: Three times a day (TID) | SUBCUTANEOUS | Status: DC
  Administered 2015-09-18 (×2): 3 [IU] via SUBCUTANEOUS
  Administered 2015-09-19: 2 [IU] via SUBCUTANEOUS

## 2015-09-18 MED ORDER — POLYETHYLENE GLYCOL 3350 17 G PO PACK
17.0000 g | PACK | Freq: Every day | ORAL | Status: AC
  Administered 2015-09-18 – 2015-09-19 (×2): 17 g via ORAL
  Filled 2015-09-18 (×2): qty 1

## 2015-09-18 MED ORDER — BISACODYL 5 MG PO TBEC
10.0000 mg | DELAYED_RELEASE_TABLET | Freq: Once | ORAL | Status: AC
  Administered 2015-09-18: 10 mg via ORAL
  Filled 2015-09-18: qty 2

## 2015-09-18 MED ORDER — METOPROLOL SUCCINATE ER 25 MG PO TB24
25.0000 mg | ORAL_TABLET | Freq: Two times a day (BID) | ORAL | Status: DC
  Administered 2015-09-18 – 2015-09-19 (×3): 25 mg via ORAL
  Filled 2015-09-18 (×4): qty 1

## 2015-09-18 MED ORDER — PREDNISONE 5 MG PO TABS
10.0000 mg | ORAL_TABLET | Freq: Every day | ORAL | Status: AC
  Administered 2015-09-19: 10 mg via ORAL
  Filled 2015-09-18: qty 2

## 2015-09-18 MED ORDER — GLIPIZIDE ER 10 MG PO TB24
10.0000 mg | ORAL_TABLET | Freq: Every day | ORAL | Status: DC
  Administered 2015-09-19: 10 mg via ORAL
  Filled 2015-09-18 (×2): qty 1

## 2015-09-18 NOTE — Progress Notes (Signed)
Physical Therapy Vestibular Assessment    09/18/15 1253  Vestibular Assessment  General Observation reports nausea and dizziness whenever she moves her head; subsides if she is still  Symptom Behavior  Type of Dizziness Unsteady with head/body turns  Frequency of Dizziness with movement   Duration of Dizziness while moving  Aggravating Factors Activity in general  Relieving Factors Rest  Occulomotor Exam  Occulomotor Alignment Normal  Spontaneous Absent  Gaze-induced Absent  Head shaking Horizontal Comment (Pt unable to shake head sufficiently)  Smooth Pursuits Saccades  Vestibulo-Occular Reflex  VOR 1 Head Only (x 1 viewing) pt only able to keep eyes on target if moving VERY slowly; if assist her to speed up, she closes eyes and stiffens neck to stop movement  VOR to Slow Head Movement Normal (VERY SLOw)  Auditory  Comments denies changes in hearing  Cognition  Cognition Orientation Level Oriented x 4    09/18/2015 Barry Brunner, PT Pager: [phone]

## 2015-09-18 NOTE — Telephone Encounter (Signed)
Lt mess regarding new patient referral

## 2015-09-18 NOTE — Progress Notes (Signed)
Patient Name: [name] Date of Encounter: 09/18/2015     Principal Problem:   Acute encephalopathy Active Problems:   Diabetes mellitus with neurological manifestation (HCC)   HTN (hypertension)   Hypothyroidism   High cholesterol   Iron deficiency anemia   Peripheral neuropathy (HCC)   Aphasia   Pleural effusion on left   AKI (acute kidney injury) ([place])   Hyperlipidemia LDL goal <70    SUBJECTIVE  No chest pain or shortness of breath.  She complains of nausea and poor appetite.  This has been a recent chronic problem.  CURRENT MEDS . aspirin  81 mg Oral Daily  . atorvastatin  80 mg Oral Daily  . bisacodyl  10 mg Oral Once  . enoxaparin (LOVENOX) injection  40 mg Subcutaneous Q24H  . insulin aspart  0-9 Units Subcutaneous 6 times per day  . levothyroxine  50 mcg Oral QAC breakfast  . meclizine  25 mg Oral TID  . polyethylene glycol  17 g Oral Daily  . [START ON 09/19/2015] predniSONE  10 mg Oral Q breakfast  . sertraline  25 mg Oral Daily  . cyanocobalamin  100 mcg Oral Weekly    OBJECTIVE  Filed Vitals:   09/17/15 2100 09/18/15 0200 09/18/15 0432 09/18/15 0445  BP: 162/53 183/77 164/65   Pulse: 79 78 69   Temp: 97.8 F (36.6 C) 97.8 F (36.6 C) 97.9 F (36.6 C)   TempSrc: Oral Oral Oral   Resp: 18 20 20    Height:      Weight:    191 lb 12.8 oz (87 kg)  SpO2: 96% 98% 97%     Intake/Output Summary (Last 24 hours) at 09/18/15 0956 Last data filed at 09/18/15 [id number]  Gross per 24 hour  Intake    580 ml  Output    750 ml  Net   -170 ml   Filed Weights   09/17/15 0345 09/17/15 0424 09/18/15 0445  Weight: 186 lb 8.2 oz (84.6 kg) 186 lb 8.2 oz (84.6 kg) 191 lb 12.8 oz (87 kg)    PHYSICAL EXAM  General: Pleasant, NAD.  Sitting up eating breakfast.   Neuro: Alert and oriented X 3. Moves all extremities spontaneously. Psych: Normal affect. HEENT:  Normal  Neck: Supple without bruits or JVD. Lungs:  Resp regular and unlabored, CTA. Heart: RRR  no s3, s4 grade 1/6 systolic ejection murmur at base. Abdomen: Soft, non-tender, non-distended, BS + x 4.  Extremities: No clubbing, cyanosis or edema. DP/PT/Radials 2+ and equal bilaterally.  Accessory Clinical Findings  CBC  Recent Labs  09/16/15 1034 09/17/15 0334  WBC 6.9 8.6  NEUTROABS 5.2 7.5  HGB 9.1* 10.3*  HCT 28.9* 32.1*  MCV 82.8 82.1  PLT 156 99991111   Basic Metabolic Panel  Recent Labs  09/16/15 1034 09/17/15 0334 09/18/15 0413  NA 138 132* 134*  K 3.5 3.8 3.7  CL 106 100* 103  CO2 25 21* 21*  GLUCOSE 92 311* 191*  BUN 16 23* 28*  CREATININE 1.14* 1.59* 1.47*  CALCIUM 8.3* 8.9 8.4*  MG 1.5* 2.4  --   PHOS 3.4  --   --    Liver Function Tests  Recent Labs  09/16/15 1034 09/17/15 0334  AST 15 15  ALT 10* 11*  ALKPHOS 75 79  BILITOT 0.6 0.6  PROT 5.6* 6.1*  ALBUMIN 2.6* 2.6*   No results for input(s): LIPASE, AMYLASE in the last 72 hours. Cardiac Enzymes  Recent Labs  09/16/15 1830  TROPONINI 0.03   BNP Invalid input(s): POCBNP D-Dimer No results for input(s): DDIMER in the last 72 hours. Hemoglobin A1C  Recent Labs  09/15/15 2130  HGBA1C 6.2*   Fasting Lipid Panel  Recent Labs  09/16/15 0412  CHOL 208*  HDL 27*  LDLCALC 140*  TRIG 204*  CHOLHDL 7.7   Thyroid Function Tests  Recent Labs  09/16/15 2142  TSH 1.164    TELE  Normal sinus rhythm  ECG    Radiology/Studies  Dg Chest 2 View  09/05/2015  CLINICAL DATA:  Shortness of breath for 1 week EXAM: CHEST  2 VIEW COMPARISON:  October 11, 2014 FINDINGS: There is consolidation in the lateral and posterior left base regions with small left effusion. Lungs elsewhere clear. Heart is mildly enlarged with pulmonary vascularity within normal limits. Patient is status post coronary artery bypass grafting. No adenopathy. There is a hiatal hernia present. There is atherosclerotic change throughout the aorta. No bone lesions. IMPRESSION: Infiltrate left base with small left  effusion. Moderate hiatal hernia, stable. Stable cardiomegaly. Followup PA and lateral chest radiographs recommended in 3-4 weeks following trial of antibiotic therapy to ensure resolution and exclude underlying malignancy. Electronically Signed   By: Lowella Grip III M.D.   On: 09/05/2015 13:41   Ct Head Wo Contrast  09/15/2015  CLINICAL DATA:  81 year old hypertensive female recently discharged from hospital for treatment of pneumonia. Presenting with aphasia and confusion. Initial encounter. EXAM: CT HEAD WITHOUT CONTRAST TECHNIQUE: Contiguous axial images were obtained from the base of the skull through the vertex without intravenous contrast. COMPARISON:  05/02/2008. FINDINGS: Slightly dense appearance of the basilar artery/basilar tip which appears different than the prior CT and in the proper clinical setting, thrombosis could not be excluded. Alternatively, this may be related to atherosclerotic changes. Otherwise no CT evidence of large acute infarct. Moderate small vessel disease type changes. No intracranial hemorrhage. Global atrophy without hydrocephalus. No intracranial mass lesion noted on this unenhanced exam. Vascular calcifications. Mastoid air cells, middle ear cavities and visualized paranasal sinuses are clear. Post lens replacement.  Exophthalmos. IMPRESSION: Slightly dense appearance of the basilar artery/basilar tip which appears different than the prior CT and in the proper clinical setting, thrombosis could not be excluded. Alternatively, this may be related to atherosclerotic changes. Otherwise no CT evidence of large acute infarct. Moderate small vessel disease type changes. No intracranial hemorrhage. Global atrophy without hydrocephalus. Electronically Signed   By: Genia Del M.D.   On: 09/15/2015 13:46   Mr Brain Wo Contrast  09/15/2015  CLINICAL DATA:  81 year old hypertensive female with aphasia and confusion. Subsequent encounter. EXAM: MRI HEAD WITHOUT CONTRAST MRA  HEAD WITHOUT CONTRAST TECHNIQUE: Multiplanar, multiecho pulse sequences of the brain and surrounding structures were obtained without intravenous contrast. Angiographic images of the head were obtained using MRA technique without contrast. COMPARISON:  09/15/2015 CT. FINDINGS: MRI HEAD FINDINGS Exam is motion degraded. No acute infarct or intracranial hemorrhage. Moderate small vessel disease type changes. Global atrophy without hydrocephalus. No intracranial mass lesion noted on this unenhanced exam. Major intracranial vascular structures are patent. Post lens replacement.  Exophthalmos. Partially empty sella incidentally noted. Cervical medullary junction and pineal region unremarkable. MRA HEAD FINDINGS Exam is motion degraded. Mild to moderate narrowing at the junction of the left internal carotid artery cavernous and supraclinoid segment Mild narrowing right internal carotid artery cavernous and supraclinoid segment. Moderate focal narrowing mid aspect A1 segment right anterior cerebral artery. Mild to moderate narrowing mid  to distal M1 segment right middle cerebral artery. Middle cerebral artery branch vessel moderate narrowing and irregularity bilaterally. Mild to slightly moderate narrowing distal right vertebral artery. Mild narrowing distal left vertebral artery. Nonvisualized right posterior inferior cerebellar artery and left anterior inferior cerebellar artery. No significant stenosis of the basilar artery. Moderate narrowing mid aspect right posterior cerebral artery P2 segment. Mild narrowing distal branches posterior cerebral artery bilaterally. Mild narrowing proximal left posterior cerebral artery. No aneurysm noted. IMPRESSION: MRI HEAD Exam is motion degraded. No acute infarct. Moderate small vessel disease type changes. Global atrophy without hydrocephalus. MRA HEAD FINDINGS Exam is motion degraded. No significant stenosis of the basilar artery. Scattered intracranial atherosclerotic type  changes as detailed above. Electronically Signed   By: Genia Del M.D.   On: 09/15/2015 19:19   Dg Chest Portable 1 View  09/15/2015  CLINICAL DATA:  Altered mental status, recent pneumonia EXAM: PORTABLE CHEST 1 VIEW COMPARISON:  09/05/2015 FINDINGS: Cardiomegaly again noted. Status post CABG. There is small left pleural effusion with left basilar atelectasis or infiltrate. No pulmonary edema. IMPRESSION: Small left pleural effusion left basilar atelectasis or infiltrate. No pulmonary edema. Electronically Signed   By: Lahoma Crocker M.D.   On: 09/15/2015 13:29   Mr Jodene Nam Head/brain Wo Cm  09/15/2015  CLINICAL DATA:  81 year old hypertensive female with aphasia and confusion. Subsequent encounter. EXAM: MRI HEAD WITHOUT CONTRAST MRA HEAD WITHOUT CONTRAST TECHNIQUE: Multiplanar, multiecho pulse sequences of the brain and surrounding structures were obtained without intravenous contrast. Angiographic images of the head were obtained using MRA technique without contrast. COMPARISON:  09/15/2015 CT. FINDINGS: MRI HEAD FINDINGS Exam is motion degraded. No acute infarct or intracranial hemorrhage. Moderate small vessel disease type changes. Global atrophy without hydrocephalus. No intracranial mass lesion noted on this unenhanced exam. Major intracranial vascular structures are patent. Post lens replacement.  Exophthalmos. Partially empty sella incidentally noted. Cervical medullary junction and pineal region unremarkable. MRA HEAD FINDINGS Exam is motion degraded. Mild to moderate narrowing at the junction of the left internal carotid artery cavernous and supraclinoid segment Mild narrowing right internal carotid artery cavernous and supraclinoid segment. Moderate focal narrowing mid aspect A1 segment right anterior cerebral artery. Mild to moderate narrowing mid to distal M1 segment right middle cerebral artery. Middle cerebral artery branch vessel moderate narrowing and irregularity bilaterally. Mild to slightly  moderate narrowing distal right vertebral artery. Mild narrowing distal left vertebral artery. Nonvisualized right posterior inferior cerebellar artery and left anterior inferior cerebellar artery. No significant stenosis of the basilar artery. Moderate narrowing mid aspect right posterior cerebral artery P2 segment. Mild narrowing distal branches posterior cerebral artery bilaterally. Mild narrowing proximal left posterior cerebral artery. No aneurysm noted. IMPRESSION: MRI HEAD Exam is motion degraded. No acute infarct. Moderate small vessel disease type changes. Global atrophy without hydrocephalus. MRA HEAD FINDINGS Exam is motion degraded. No significant stenosis of the basilar artery. Scattered intracranial atherosclerotic type changes as detailed above. Electronically Signed   By: Genia Del M.D.   On: 09/15/2015 19:19    ASSESSMENT AND PLAN  1. Ischemic heart disease, S/P CABG (Dr Cyndia Bent). No recent angina 2. Hypertension with hypertensive urgency, resolving 3. Diabetes mellitus with peripheral neuropathy. 4. Renal insufficiency, improving with oral hydration 5. Hypercholesterolemia. Now on higher dose atorvastatin.  Plan: Agree with current therapy.  No active cardiac issues now.  We will sign off.  Following my retirement she will follow-up with Dr. Acie Fredrickson in about 6 months for cardiology.  Signed, Warren Danes MD

## 2015-09-18 NOTE — Progress Notes (Signed)
Patient Demographics  [name], is a 81 y.o. female, DOB - 1933-01-19, [id number]  Admit date - 09/15/2015   Admitting Physician Waldemar Dickens, MD  Outpatient Primary MD for the patient is Eliezer Lofts, MD  LOS - 3   Chief Complaint  Patient presents with  . Aphasia       Admission HPI/Brief narrative: Patient is an 81 year old female with past medical history of diabetes mellitus, hyperlipidemia and hypertension with a recent hospitalization for pneumonia who presented to the emergency room on 12/19 with difficulty word finding, encephalopathy and hypertensive urgency. At that time, lactic acid level elevated and chest x-ray noted mild infiltrate. He was a concern about the possibility of pneumonia causing sepsis or CVA.  Over the next few days, patient improved. Today she is alert and oriented 3 almost fully back at baseline. She still has occasional word finding difficulty. MRI negative for acute CVA. Seen by neurology who are evaluating her for possible TIA. Lactic acid level normalized soon after admission. Patient feeling good today. No complaints. No shortness of breath. No headache. She answers questions appropriately  Subjective:   [name] today has, No headache, No chest pain, No abdominal pain ,No new weakness tingling or numbness, No Cough - SOB, complains of nausea and vertigo.  Assessment & Plan    Principal Problem:   Acute encephalopathy Active Problems:   Diabetes mellitus with neurological manifestation (HCC)   Hypothyroidism   High cholesterol   HTN (hypertension)   Iron deficiency anemia   Peripheral neuropathy (HCC)   Aphasia   Pleural effusion on left   AKI (acute kidney injury) ([place])   Hyperlipidemia LDL goal <70  Acute encephalopathy - Patient with altered mental status, incoherent speech on presentation, most likely related to dehydration,  possible infection, and hypertensive urgency. - MRI with no evidence of acute infarct, MRA with no significant stenosis. - Carotid Doppler Right side indicates a 40-59% to 60-79% internal carotid artery stenosis due to peak systolic and end diastolic. Left side indicates 40-59% internal carotid stenosis. Vertebral flow is antegrade bilaterally.  - EEG with no evidence of active seizures. - back to baseline.  Hypertension - Patient with hypertensive urgency on presentation, will resume back on metoprolol, continue to hold Cozaar given her renal function.  Diabetes mellitus - CBGs slightly uncontrolled, will change sliding scale from every 4 hour to 3 times a day with meals as he is tolerating by mouth, will start on glipizide XL. - Continue with the steroid taper  Hypothyroidism - Continue Synthroid  Hyperlipidemia - Now on  higher dose statin  Iron deficiency anemia - Hemoglobin is stable, since the seen by GI during previous hospitalization  History of coronary artery disease - Denies any chest pain or shortness of breath, and tinea with aspirin, statin, resume the beta blocker, we'll resume on Cozaar when renal function improves.  Acute on Chronic kidney disease stage III - Improving with IV fluids  Nausea/vertigo - Likely related to benign positional vertigo, no acute finding on abdominal x-ray, will start on meclizine, continue with PT    Code Status: Full  Family Communication: Discussed with daughter at bedside   Disposition Plan: home when stable    Procedures  None  Consults   Neurology   cardiology   Medications  Scheduled Meds: . aspirin  81 mg Oral Daily  . atorvastatin  80 mg Oral Daily  . bisacodyl  10 mg Oral Once  . enoxaparin (LOVENOX) injection  40 mg Subcutaneous Q24H  . insulin aspart  0-9 Units Subcutaneous 6 times per day  . levothyroxine  50 mcg Oral QAC breakfast  . meclizine  25 mg Oral TID  . polyethylene glycol  17 g Oral Daily    . potassium chloride  40 mEq Oral Once  . [START ON 09/19/2015] predniSONE  10 mg Oral Q breakfast  . sertraline  25 mg Oral Daily  . cyanocobalamin  100 mcg Oral Weekly   Continuous Infusions: . sodium chloride 100 mL/hr at 09/17/15 1748   PRN Meds:.acetaminophen **OR** acetaminophen, hydrALAZINE, ipratropium-albuterol, ondansetron, senna-docusate  DVT Prophylaxis  Lovenox -   Lab Results  Component Value Date   PLT 180 09/17/2015    Antibiotics    Anti-infectives    Start     Dose/Rate Route Frequency Ordered Stop   09/16/15 0700  vancomycin (VANCOCIN) IVPB 750 mg/150 ml premix  Status:  Discontinued     750 mg 150 mL/hr over 60 Minutes Intravenous Every 12 hours 09/15/15 1922 09/17/15 1544   09/15/15 1700  ceFEPIme (MAXIPIME) 2 g in dextrose 5 % 50 mL IVPB  Status:  Discontinued     2 g 100 mL/hr over 30 Minutes Intravenous Every 24 hours 09/15/15 1637 09/17/15 1544   09/15/15 1645  vancomycin (VANCOCIN) IVPB 1000 mg/200 mL premix     1,000 mg 200 mL/hr over 60 Minutes Intravenous  Once 09/15/15 1636 09/15/15 1957          Objective:   Filed Vitals:   09/18/15 0200 09/18/15 0432 09/18/15 0445 09/18/15 1000  BP: 183/77 164/65  162/50  Pulse: 78 69  76  Temp: 97.8 F (36.6 C) 97.9 F (36.6 C)  98.4 F (36.9 C)  TempSrc: Oral Oral  Oral  Resp: 20 20  20   Height:      Weight:   87 kg (191 lb 12.8 oz)   SpO2: 98% 97%  97%    Wt Readings from Last 3 Encounters:  09/18/15 87 kg (191 lb 12.8 oz)  09/11/15 84.142 kg (185 lb 8 oz)  09/07/15 86.637 kg (191 lb)     Intake/Output Summary (Last 24 hours) at 09/18/15 1125 Last data filed at 09/18/15 [id number]  Gross per 24 hour  Intake    320 ml  Output    750 ml  Net   -430 ml     Physical Exam  Awake Alert, Oriented, sitting at edge of bed with PT  [place].AT,PERRAL Supple Neck,No JVD, No cervical lymphadenopathy appriciated.  Symmetrical Chest wall movement, Good air movement bilaterally, CTAB RRR,No  Gallops,Rubs or new Murmurs, No Parasternal Heave +ve B.Sounds, Abd Soft, No tenderness, No organomegaly appriciated, No rebound - guarding or rigidity. No Cyanosis, Clubbing or edema, No new Rash or bruise     Data Review   Micro Results Recent Results (from the past 240 hour(s))  Urine culture     Status: None   Collection Time: 09/15/15  2:32 PM  Result Value Ref Range Status   Specimen Description URINE, CATHETERIZED  Final   Special Requests NONE  Final   Culture >=100,000 COLONIES/mL YEAST  Final   Report Status 09/16/2015 FINAL  Final  Culture, blood (routine x 2) Call MD if unable  to obtain prior to antibiotics being given     Status: None (Preliminary result)   Collection Time: 09/15/15  9:18 PM  Result Value Ref Range Status   Specimen Description BLOOD LEFT HAND  Final   Special Requests BOTTLES DRAWN AEROBIC AND ANAEROBIC 10CC   Final   Culture NO GROWTH 2 DAYS  Final   Report Status PENDING  Incomplete  Culture, blood (routine x 2) Call MD if unable to obtain prior to antibiotics being given     Status: None (Preliminary result)   Collection Time: 09/15/15  9:30 PM  Result Value Ref Range Status   Specimen Description BLOOD LEFT HAND  Final   Special Requests BOTTLES DRAWN AEROBIC AND ANAEROBIC 8CC   Final   Culture NO GROWTH 2 DAYS  Final   Report Status PENDING  Incomplete  MRSA PCR Screening     Status: None   Collection Time: 09/15/15 10:04 PM  Result Value Ref Range Status   MRSA by PCR NEGATIVE NEGATIVE Final    Comment:        The GeneXpert MRSA Assay (FDA approved for NASAL specimens only), is one component of a comprehensive MRSA colonization surveillance program. It is not intended to diagnose MRSA infection nor to guide or monitor treatment for MRSA infections.     Radiology Reports Dg Chest 2 View  09/05/2015  CLINICAL DATA:  Shortness of breath for 1 week EXAM: CHEST  2 VIEW COMPARISON:  October 11, 2014 FINDINGS: There is consolidation in  the lateral and posterior left base regions with small left effusion. Lungs elsewhere clear. Heart is mildly enlarged with pulmonary vascularity within normal limits. Patient is status post coronary artery bypass grafting. No adenopathy. There is a hiatal hernia present. There is atherosclerotic change throughout the aorta. No bone lesions. IMPRESSION: Infiltrate left base with small left effusion. Moderate hiatal hernia, stable. Stable cardiomegaly. Followup PA and lateral chest radiographs recommended in 3-4 weeks following trial of antibiotic therapy to ensure resolution and exclude underlying malignancy. Electronically Signed   By: Lowella Grip III M.D.   On: 09/05/2015 13:41   Ct Head Wo Contrast  09/15/2015  CLINICAL DATA:  81 year old hypertensive female recently discharged from hospital for treatment of pneumonia. Presenting with aphasia and confusion. Initial encounter. EXAM: CT HEAD WITHOUT CONTRAST TECHNIQUE: Contiguous axial images were obtained from the base of the skull through the vertex without intravenous contrast. COMPARISON:  05/02/2008. FINDINGS: Slightly dense appearance of the basilar artery/basilar tip which appears different than the prior CT and in the proper clinical setting, thrombosis could not be excluded. Alternatively, this may be related to atherosclerotic changes. Otherwise no CT evidence of large acute infarct. Moderate small vessel disease type changes. No intracranial hemorrhage. Global atrophy without hydrocephalus. No intracranial mass lesion noted on this unenhanced exam. Vascular calcifications. Mastoid air cells, middle ear cavities and visualized paranasal sinuses are clear. Post lens replacement.  Exophthalmos. IMPRESSION: Slightly dense appearance of the basilar artery/basilar tip which appears different than the prior CT and in the proper clinical setting, thrombosis could not be excluded. Alternatively, this may be related to atherosclerotic changes. Otherwise  no CT evidence of large acute infarct. Moderate small vessel disease type changes. No intracranial hemorrhage. Global atrophy without hydrocephalus. Electronically Signed   By: Genia Del M.D.   On: 09/15/2015 13:46   Mr Brain Wo Contrast  09/15/2015  CLINICAL DATA:  81 year old hypertensive female with aphasia and confusion. Subsequent encounter. EXAM: MRI HEAD WITHOUT CONTRAST MRA  HEAD WITHOUT CONTRAST TECHNIQUE: Multiplanar, multiecho pulse sequences of the brain and surrounding structures were obtained without intravenous contrast. Angiographic images of the head were obtained using MRA technique without contrast. COMPARISON:  09/15/2015 CT. FINDINGS: MRI HEAD FINDINGS Exam is motion degraded. No acute infarct or intracranial hemorrhage. Moderate small vessel disease type changes. Global atrophy without hydrocephalus. No intracranial mass lesion noted on this unenhanced exam. Major intracranial vascular structures are patent. Post lens replacement.  Exophthalmos. Partially empty sella incidentally noted. Cervical medullary junction and pineal region unremarkable. MRA HEAD FINDINGS Exam is motion degraded. Mild to moderate narrowing at the junction of the left internal carotid artery cavernous and supraclinoid segment Mild narrowing right internal carotid artery cavernous and supraclinoid segment. Moderate focal narrowing mid aspect A1 segment right anterior cerebral artery. Mild to moderate narrowing mid to distal M1 segment right middle cerebral artery. Middle cerebral artery branch vessel moderate narrowing and irregularity bilaterally. Mild to slightly moderate narrowing distal right vertebral artery. Mild narrowing distal left vertebral artery. Nonvisualized right posterior inferior cerebellar artery and left anterior inferior cerebellar artery. No significant stenosis of the basilar artery. Moderate narrowing mid aspect right posterior cerebral artery P2 segment. Mild narrowing distal branches  posterior cerebral artery bilaterally. Mild narrowing proximal left posterior cerebral artery. No aneurysm noted. IMPRESSION: MRI HEAD Exam is motion degraded. No acute infarct. Moderate small vessel disease type changes. Global atrophy without hydrocephalus. MRA HEAD FINDINGS Exam is motion degraded. No significant stenosis of the basilar artery. Scattered intracranial atherosclerotic type changes as detailed above. Electronically Signed   By: Genia Del M.D.   On: 09/15/2015 19:19   Dg Chest Portable 1 View  09/15/2015  CLINICAL DATA:  Altered mental status, recent pneumonia EXAM: PORTABLE CHEST 1 VIEW COMPARISON:  09/05/2015 FINDINGS: Cardiomegaly again noted. Status post CABG. There is small left pleural effusion with left basilar atelectasis or infiltrate. No pulmonary edema. IMPRESSION: Small left pleural effusion left basilar atelectasis or infiltrate. No pulmonary edema. Electronically Signed   By: Lahoma Crocker M.D.   On: 09/15/2015 13:29   Dg Abd Portable 1v  09/18/2015  CLINICAL DATA:  Nausea this morning.  No pain. EXAM: PORTABLE ABDOMEN - 1 VIEW COMPARISON:  Chest x-ray 09/05/2015 FINDINGS: Stable opacification of the left base. Bowel gas pattern is nonobstructive. Multiple surgical clips over the lower abdomen and pelvis. Calcified plaque over the abdominal aorta and right iliac artery. Right total hip arthroplasty. Mild degenerative change of the left hip and moderate degenerative change of the spine. IMPRESSION: Nonobstructive bowel gas pattern. Electronically Signed   By: Marin Olp M.D.   On: 09/18/2015 10:12   Mr Mra Head/brain Wo Cm  09/15/2015  CLINICAL DATA:  81 year old hypertensive female with aphasia and confusion. Subsequent encounter. EXAM: MRI HEAD WITHOUT CONTRAST MRA HEAD WITHOUT CONTRAST TECHNIQUE: Multiplanar, multiecho pulse sequences of the brain and surrounding structures were obtained without intravenous contrast. Angiographic images of the head were obtained using  MRA technique without contrast. COMPARISON:  09/15/2015 CT. FINDINGS: MRI HEAD FINDINGS Exam is motion degraded. No acute infarct or intracranial hemorrhage. Moderate small vessel disease type changes. Global atrophy without hydrocephalus. No intracranial mass lesion noted on this unenhanced exam. Major intracranial vascular structures are patent. Post lens replacement.  Exophthalmos. Partially empty sella incidentally noted. Cervical medullary junction and pineal region unremarkable. MRA HEAD FINDINGS Exam is motion degraded. Mild to moderate narrowing at the junction of the left internal carotid artery cavernous and supraclinoid segment Mild narrowing right internal carotid artery  cavernous and supraclinoid segment. Moderate focal narrowing mid aspect A1 segment right anterior cerebral artery. Mild to moderate narrowing mid to distal M1 segment right middle cerebral artery. Middle cerebral artery branch vessel moderate narrowing and irregularity bilaterally. Mild to slightly moderate narrowing distal right vertebral artery. Mild narrowing distal left vertebral artery. Nonvisualized right posterior inferior cerebellar artery and left anterior inferior cerebellar artery. No significant stenosis of the basilar artery. Moderate narrowing mid aspect right posterior cerebral artery P2 segment. Mild narrowing distal branches posterior cerebral artery bilaterally. Mild narrowing proximal left posterior cerebral artery. No aneurysm noted. IMPRESSION: MRI HEAD Exam is motion degraded. No acute infarct. Moderate small vessel disease type changes. Global atrophy without hydrocephalus. MRA HEAD FINDINGS Exam is motion degraded. No significant stenosis of the basilar artery. Scattered intracranial atherosclerotic type changes as detailed above. Electronically Signed   By: Genia Del M.D.   On: 09/15/2015 19:19     CBC  Recent Labs Lab 09/11/15 1138 09/15/15 1237 09/15/15 1246 09/16/15 1034 09/17/15 0334  WBC 8.2  6.0  --  6.9 8.6  HGB 10.6* 10.4* 12.6 9.1* 10.3*  HCT 34.2* 33.3* 37.0 28.9* 32.1*  PLT 209.0 167  --  156 180  MCV 82.7 83.7  --  82.8 82.1  MCH  --  26.1  --  26.1 26.3  MCHC 31.1 31.2  --  31.5 32.1  RDW 19.8* 16.9*  --  17.1* 17.2*  LYMPHSABS 1.1 0.9  --  1.0 0.7  MONOABS 0.7 0.5  --  0.8 0.4  EOSABS 0.2 0.2  --  0.1 0.0  BASOSABS 0.1 0.1  --  0.1 0.0    Chemistries   Recent Labs Lab 09/11/15 1138 09/15/15 1237 09/15/15 1246 09/16/15 1034 09/17/15 0334 09/18/15 0413  NA 137 135 137 138 132* 134*  K 4.0 3.4* 3.4* 3.5 3.8 3.7  CL 101 102 99* 106 100* 103  CO2 27 22  --  25 21* 21*  GLUCOSE 274* 229* 224* 92 311* 191*  BUN 14 14 17 16  23* 28*  CREATININE 1.30* 1.11* 1.10* 1.14* 1.59* 1.47*  CALCIUM 9.3 8.9  --  8.3* 8.9 8.4*  MG  --   --   --  1.5* 2.4  --   AST  --  17  --  15 15  --   ALT  --  10*  --  10* 11*  --   ALKPHOS  --  88  --  75 79  --   BILITOT  --  0.7  --  0.6 0.6  --    ------------------------------------------------------------------------------------------------------------------ estimated creatinine clearance is 31.3 mL/min (by C-G formula based on Cr of 1.47). ------------------------------------------------------------------------------------------------------------------  Recent Labs  09/15/15 2130  HGBA1C 6.2*   ------------------------------------------------------------------------------------------------------------------  Recent Labs  09/16/15 0412  CHOL 208*  HDL 27*  LDLCALC 140*  TRIG 204*  CHOLHDL 7.7   ------------------------------------------------------------------------------------------------------------------  Recent Labs  09/16/15 2142  TSH 1.164   ------------------------------------------------------------------------------------------------------------------ No results for input(s): VITAMINB12, FOLATE, FERRITIN, TIBC, IRON, RETICCTPCT in the last 72 hours.  Coagulation profile  Recent Labs Lab  09/15/15 1237  INR 1.10    No results for input(s): DDIMER in the last 72 hours.  Cardiac Enzymes  Recent Labs Lab 09/16/15 1830  TROPONINI 0.03   ------------------------------------------------------------------------------------------------------------------ Invalid input(s): POCBNP     Time Spent in minutes   30 minutes   Nyajah Hyson M.D on 09/18/2015 at 11:25 AM  Between 7am to 7pm - Pager - [phone]  After 7pm go to [email or web address] - password [facility]  [facility]   Office  [phone]

## 2015-09-18 NOTE — Progress Notes (Signed)
STROKE TEAM PROGRESS NOTE   HISTORY [name] is an 81 y.o. female who was normal last night 09/14/2015, time unknown. This AM 09/14/2108 daughter noted she was having difficulty following commands, could not make a sentence --"word salad", having difficulty doing normal tasks. She had no difficulty with her strength however. Due to fear she may be hypoglycemic she was brought to the ED. On exam ED MD noted aphasia and possible right field cut and Neurology was called. Patient currently in no distress. Per daughter, she has been N/V for past week and has not been able to keep her ASA down. In addition she had not been able to keep her BP medications down and her BP was in the systolic 99991111 for the past three days. Patient was not administered TPA secondary to being out of window. She was admitted for further evaluation and treatment.   SUBJECTIVE (INTERVAL HISTORY) Patient states that her confusion and disorientation is improved and  she is now back to baseline. 2 of her daughters are present at the bedside and states she is a lot better. EEG showed mild generalized slowing without definite epileptiform activity   OBJECTIVE Temp:  [97.8 F (36.6 C)-98.5 F (36.9 C)] 98.4 F (36.9 C) (12/22 1000) Pulse Rate:  [69-83] 76 (12/22 1000) Cardiac Rhythm:  [-] Heart block (12/22 0841) Resp:  [18-20] 20 (12/22 1000) BP: (146-183)/(47-114) 162/50 mmHg (12/22 1000) SpO2:  [94 %-98 %] 97 % (12/22 1000) Weight:  [191 lb 12.8 oz (87 kg)] 191 lb 12.8 oz (87 kg) (12/22 0445)  CBC:   Recent Labs Lab 09/16/15 1034 09/17/15 0334  WBC 6.9 8.6  NEUTROABS 5.2 7.5  HGB 9.1* 10.3*  HCT 28.9* 32.1*  MCV 82.8 82.1  PLT 156 [id number]    Basic Metabolic Panel:   Recent Labs Lab 09/16/15 1034 09/17/15 0334 09/18/15 0413  NA 138 132* 134*  K 3.5 3.8 3.7  CL 106 100* 103  CO2 25 21* 21*  GLUCOSE 92 311* 191*  BUN 16 23* 28*  CREATININE 1.14* 1.59* 1.47*  CALCIUM 8.3* 8.9 8.4*  MG 1.5* 2.4  --    PHOS 3.4  --   --     Lipid Panel:     Component Value Date/Time   CHOL 208* 09/16/2015 0412   TRIG 204* 09/16/2015 0412   HDL 27* 09/16/2015 0412   CHOLHDL 7.7 09/16/2015 0412   VLDL 41* 09/16/2015 0412   LDLCALC 140* 09/16/2015 0412   HgbA1c:  Lab Results  Component Value Date   HGBA1C 6.2* 09/15/2015   Urine Drug Screen: No results found for: LABOPIA, COCAINSCRNUR, LABBENZ, AMPHETMU, THCU, LABBARB    IMAGING  Ct Head Wo Contrast 09/15/2015   Slightly dense appearance of the basilar artery/basilar tip which appears different than the prior CT and in the proper clinical setting, thrombosis could not be excluded. Alternatively, this may be related to atherosclerotic changes. Otherwise no CT evidence of large acute infarct. Moderate small vessel disease type changes. No intracranial hemorrhage. Global atrophy without hydrocephalus.   Dg Chest Portable 1 View 09/15/2015  Small left pleural effusion left basilar atelectasis or infiltrate. No pulmonary edema.  MRI HEAD  09/15/2015  Exam is motion degraded. No acute infarct. Moderate small vessel disease type changes. Global atrophy without hydrocephalus.   MRA HEAD  09/15/2015  Exam is motion degraded. No significant stenosis of the basilar artery. Scattered intracranial atherosclerotic type changes       PHYSICAL EXAM Elderly lady not  in distress. . Afebrile. Head is nontraumatic. Neck is supple without bruit.    Cardiac exam no murmur or gallop. Lungs are clear to auscultation. Distal pulses are well felt. Neurological Exam ;  Awake  Alert oriented x 2 able to correctly name  her 2 daughters. Diminished attention, registration and recall Normal speech and language.able to name repeat and comprehend well .eye movements full without nystagmus.fundi were not visualized. Vision acuity and fields appear normal. Hearing is normal. Palatal movements are normal. Face symmetric. Tongue midline. Normal strength, tone, reflexes and  coordination. Normal sensation. Gait deferred. ASSESSMENT/PLAN [name] is a 81 y.o. female with history of coronary artery disease status post CABG, breast cancer, uterine cancer, hypothyroidism, hyperlipidemia and hypertension presenting with expressive aphasia and confusion. She did not receive IV t-PA due to delay in arrival.   Encephalopathy vs seizure  MRI  No acute infarct  MRA  No significant stenosis  Carotid Doppler Right side indicates a 40-59% to 60-79% internal carotid artery stenosis due to peak systolic and end diastolic. Left side indicates 40-59% internal carotid stenosis. Vertebral flow is antegrade bilaterally.    2D Echo  pending   Check EEG  LDL 140  HgbA1c 6.8  Lovenox 40 mg sq daily for VTE prophylaxis Diet heart healthy/carb modified Room service appropriate?: Yes; Fluid consistency:: Thin  aspirin 81 mg daily prior to admission, now on aspirin 300 mg suppository daily. Ok to resume aspirin as PTA. Order updated.  Ongoing aggressive stroke risk factor management  Therapy recommendations:  HH OT  Disposition:  pending   Hypertension  Stable  Hyperlipidemia  Home meds:  lipitor 40, resumed in hospital  LDL 140, goal < 70  Continue statin at discharge  Diabetes  CBGs trending down, started on D5W  HgbA1c 6.8, at goal < 7.0  Controlled  Other Stroke Risk Factors  Advanced age  Obesity, Body mass index is 35.07 kg/(m^2).   Coronary artery disease w/ CABG  Suspected HCAP  Vancomycin and cefepime  Other Active Problems  hypothryoid  Hospital day # 3 I have personally examined this patient, reviewed notes, independently viewed imaging studies, participated in medical decision making and plan of care. I have made any additions or clarifications directly to the above note.  She presented with confusion and disorientation and speech difficulties in the setting of recent pneumonia and antibiotics possibly from delirium  etiology unclear. Doubt this represents a stroke .  Marland Kitchen Discussed with daughter and patient and answered questions. Antony Contras, MD Medical Director [facility] Pager: [phone] 09/18/2015 1:24 PM    To contact Stroke Continuity provider, please refer to [email or web address] After hours, contact General Neurology

## 2015-09-18 NOTE — Progress Notes (Signed)
Physical Therapy Treatment Patient Details Name: [name] MRN: [id number] DOB: July 23, 1933 Today's Date: 09/18/2015    History of Present Illness pt presents with Encephaolpathy and recent admit for PNA, with N/V from antibiotics.  pt with hx of CAD< CABG, Breast CA, Uterine CA, HTN, and DM.      PT Comments    Patient reporting her dizziness is primarily a feeling of nausea that goes away when she is still and increases with any movement (including solely head movement). See separate vestibular assessment. Patient difficulty to accurately assess for hypofunction/labrynthitis/neuritis (due to holds her neck stiffly with attempts at head thrust and will not shake her head quickly enough or for long enough period for head-shaking test), however based on instability, nausea, and "dizziness" feel pt likely has a component of peripheral vestibular dysfunction. Discussed with Dr. Waldron Labs at bedside.  Patient currently requires up to mod assist to ambulate, but responded positively to compensatory strategies introduced. Will need further PT session 12/23 (after meclizine initiated and additional doses of prednisone completed) to continue vestibular rehab and education for safe use of RW.   Follow Up Recommendations  Home health PT;Supervision/Assistance - 24 hour     Equipment Recommendations  Rolling walker with 5" wheels    Recommendations for Other Services OT consult     Precautions / Restrictions Precautions Precautions: Fall    Mobility  Bed Mobility Overal bed mobility: Needs Assistance Bed Mobility: Rolling;Sidelying to Sit Rolling: Min assist Sidelying to sit: Min assist       General bed mobility comments: due to vertigo symptoms, educated to look at fixed target; moving slowly she reported no incr in nausea as coming to EOB  Transfers Overall transfer level: Needs assistance Equipment used: 1 person hand held assist Transfers: Sit to/from Stand Sit to Stand:  Min assist         General transfer comment: vc for fixing gaze on target  Ambulation/Gait Ambulation/Gait assistance: Mod assist;Min assist Ambulation Distance (Feet): 28 Feet Assistive device: None (vs holding IV ) Gait Pattern/deviations: Step-through pattern;Decreased stride length;Shuffle;Staggering left;Staggering right;Drifts right/left;Wide base of support Gait velocity: very slow, cautious Gait velocity interpretation: Below normal speed for age/gender General Gait Details: with IV pole and fixing gaze on target on the pole, she was unsteady and only required min assist for balance; on return to chair, did not use UE support or visual target with pt more unsteady and up to mod assist to recover balance    Stairs            Wheelchair Mobility    Modified Rankin (Stroke Patients Only)       Balance Overall balance assessment: Needs assistance Sitting-balance support: No upper extremity supported;Feet supported Sitting balance-Leahy Scale: Fair     Standing balance support: No upper extremity supported Standing balance-Leahy Scale: Poor                      Cognition Arousal/Alertness: Awake/alert Behavior During Therapy: WFL for tasks assessed/performed Overall Cognitive Status: Within Functional Limits for tasks assessed                 General Comments: daughter reports cognition cleared when her fever broke last night    Exercises      General Comments General comments (skin integrity, edema, etc.): see separate vestibular assessment      Pertinent Vitals/Pain Pain Assessment: No/denies pain    Home Living  Prior Function            PT Goals (current goals can now be found in the care plan section) Acute Rehab PT Goals Patient Stated Goal: feel better Time For Goal Achievement: 09/30/15 Progress towards PT goals: Progressing toward goals    Frequency  Min 3X/week    PT Plan Current plan  remains appropriate    Co-evaluation             End of Session Equipment Utilized During Treatment: Gait belt Activity Tolerance: Treatment limited secondary to medical complications (Comment) (nausea and dizziness) Patient left: in chair;with call bell/phone within reach;with chair alarm set;with family/visitor present (MD in room)     Time: [id number] PT Time Calculation (min) (ACUTE ONLY): 25 min  Charges:  $Gait Training: 8-22 mins $Physical Performance Test: 8-22 mins                    G Codes:      Malekai Markwood 10-08-15, 10:51 AM Pager [phone]

## 2015-09-18 NOTE — Care Management Important Message (Signed)
Important Message  Patient Details  Name: BREAH DEFUSCO MRN: [id number] Date of Birth: 1933/01/05   Medicare Important Message Given:  Yes    Tashena Ibach P [place] 09/18/2015, 1:51 PM

## 2015-09-18 NOTE — Progress Notes (Signed)
Occupational Therapy Treatment Patient Details Name: [name] MRN: [id number] DOB: November 30, 1932 Today's Date: 09/18/2015    History of present illness pt presents with Encephaolpathy and recent admit for PNA, with N/V from antibiotics.  pt with hx of CAD< CABG, Breast CA, Uterine CA, HTN, and DM.     OT comments  Pt still reporting and demonstrating dizziness at rest and during activity. Pt required min assist to maintain balance while completing all ADL tasks during session due to LOB x4 laterally because of constant dizziness. Pt will need further OT session 12/23 to practice compensatory strategies for balance deficits and safe use of DME. Will continue to follow acutely.   Follow Up Recommendations  Home health OT;Supervision/Assistance - 24 hour (intially)    Equipment Recommendations  3 in 1 bedside comode    Recommendations for Other Services      Precautions / Restrictions Precautions Precautions: Fall Restrictions Weight Bearing Restrictions: No       Mobility Bed Mobility Overal bed mobility: Needs Assistance Bed Mobility: Rolling;Sidelying to Sit Rolling: Min assist Sidelying to sit: Min assist       General bed mobility comments: Practiced fixed gaze technique when repositioning in the bed  Transfers Overall transfer level: Needs assistance Equipment used: 1 person hand held assist Transfers: Sit to/from Stand Sit to Stand: Min assist         General transfer comment: Min assist to maintain balance. Pt with LOB x4 laterally. Verbal cues for fixed gaze technique.    Balance Overall balance assessment: Needs assistance Sitting-balance support: No upper extremity supported;Feet supported Sitting balance-Leahy Scale: Fair     Standing balance support: Single extremity supported;During functional activity Standing balance-Leahy Scale: Poor Standing balance comment: Heavy reliance on at least 1 UE supported by IV pole, furniture in room, sink  counter or grab bars. LOB x4                   ADL Overall ADL's : Needs assistance/impaired     Grooming: Wash/dry hands;Wash/dry face;Oral care;Brushing hair;Min guard;Standing           Upper Body Dressing : Min guard;Sitting   Lower Body Dressing: Minimal assistance;Sit to/from stand   Toilet Transfer: Minimal assistance;Ambulation;Regular Toilet;Grab bars   Toileting- Clothing Manipulation and Hygiene: Min guard;Sit to/from stand       Functional mobility during ADLs: Minimal assistance General ADL Comments: Min assist for static and dynamic standing ADL tasks. Pt reports dizziness still present at all times and did cause LOB x4 during session. Practiced fixing gaze technique while ambulating and when sudden increase in dizzines when repositioning in the bed. Pt will stay at daughter's house upon d/c initially per daughter report.       Vision                     Perception     Praxis      Cognition   Behavior During Therapy: [facility] for tasks assessed/performed Overall Cognitive Status: Within Functional Limits for tasks assessed                  General Comments: daughter reports cognition cleared when her fever broke last night    Extremity/Trunk Assessment               Exercises     Shoulder Instructions       General Comments      Pertinent Vitals/ Pain  Pain Assessment: No/denies pain  Home Living                                          Prior Functioning/Environment              Frequency Min 2X/week     Progress Toward Goals  OT Goals(current goals can now be found in the care plan section)  Progress towards OT goals: Progressing toward goals  Acute Rehab OT Goals Patient Stated Goal: to go home for Christmas OT Goal Formulation: With patient/family Time For Goal Achievement: 09/30/15 Potential to Achieve Goals: Good ADL Goals Pt Will Perform Lower Body Bathing: with  supervision;with caregiver independent in assisting;sit to/from stand Pt Will Perform Lower Body Dressing: with supervision;with caregiver independent in assisting;sit to/from stand Pt Will Transfer to Toilet: with supervision;ambulating Pt Will Perform Toileting - Clothing Manipulation and hygiene: with supervision;sit to/from stand Pt Will Perform Tub/Shower Transfer: with min guard assist;ambulating;shower seat;with caregiver independent in assisting Additional ADL Goal #1: Demonstrate anticipatory awreness during ADL task in nondistracting enviornment Additional ADL Goal #2: Demonstrate ability to complete multistep ADL task without redirection in minimally distracting environment  Plan Discharge plan remains appropriate    Co-evaluation                 End of Session Equipment Utilized During Treatment: Gait belt   Activity Tolerance Patient tolerated treatment well   Patient Left in bed;with call bell/phone within reach;with nursing/sitter in room;with family/visitor present   Nurse Communication Mobility status        Time: 1347-1416 OT Time Calculation (min): 29 min  Charges: OT General Charges $OT Visit: 1 Procedure OT Treatments $Self Care/Home Management : 23-37 mins  Redmond Baseman, OTR/L 09/18/2015, 2:53 PM  Pager: [phone]

## 2015-09-19 LAB — GLUCOSE, CAPILLARY
GLUCOSE-CAPILLARY: 86 mg/dL (ref 65–99)
GLUCOSE-CAPILLARY: 159 mg/dL — AB (ref 65–99)
Glucose-Capillary: 122 mg/dL — ABNORMAL HIGH (ref 65–99)
Glucose-Capillary: 148 mg/dL — ABNORMAL HIGH (ref 65–99)

## 2015-09-19 LAB — BASIC METABOLIC PANEL
Anion gap: 6 (ref 5–15)
BUN: 30 mg/dL — AB (ref 6–20)
CALCIUM: 8.6 mg/dL — AB (ref 8.9–10.3)
CHLORIDE: 109 mmol/L (ref 101–111)
CO2: 22 mmol/L (ref 22–32)
CREATININE: 1.27 mg/dL — AB (ref 0.44–1.00)
GFR calc non Af Amer: 39 mL/min — ABNORMAL LOW (ref >60)
GFR, EST AFRICAN AMERICAN: 45 mL/min — AB (ref >60)
Glucose, Bld: 162 mg/dL — ABNORMAL HIGH (ref 65–99)
Potassium: 4.2 mmol/L (ref 3.5–5.1)
SODIUM: 137 mmol/L (ref 135–145)

## 2015-09-19 MED ORDER — SENNOSIDES-DOCUSATE SODIUM 8.6-50 MG PO TABS: 1.0000 | ORAL_TABLET | Freq: Every evening | ORAL | Status: DC | PRN

## 2015-09-19 MED ORDER — ATORVASTATIN CALCIUM 80 MG PO TABS: 80.0000 mg | ORAL_TABLET | Freq: Every day | ORAL | Status: DC

## 2015-09-19 MED ORDER — ONDANSETRON 4 MG PO TBDP: 4.0000 mg | ORAL_TABLET | Freq: Three times a day (TID) | ORAL | Status: DC | PRN

## 2015-09-19 MED ORDER — MECLIZINE HCL 25 MG PO TABS: 25.0000 mg | ORAL_TABLET | Freq: Three times a day (TID) | ORAL | Status: DC | PRN

## 2015-09-19 NOTE — Progress Notes (Signed)
Discharged to home via wheelchair with family.

## 2015-09-19 NOTE — Care Management Note (Signed)
Case Management Note  Patient Details  Name: [name] MRN: [id number] Date of Birth: [birth year]  Subjective/Objective:                    Action/Plan: Plan for discharge is home with daughter with home health services. CM met with the patient and her daughter and provided them a list of agencies in the [facility] area. They selected [place]. Tiffany with [facility] notified and accepted the referral. Bedside RN updated.   Expected Discharge Date:                  Expected Discharge Plan:  [place]  In-House Referral:     Discharge planning Services  CM Consult  Post Acute Care Choice:  Home Health Choice offered to:  Patient, Adult Children  DME Arranged:    DME Agency:     HH Arranged:  PT, OT HH Agency:     Status of Service:  In process, will continue to follow  Medicare Important Message Given:  Yes Date Medicare IM Given:    Medicare IM give by:    Date Additional Medicare IM Given:    Additional Medicare Important Message give by:     If discussed at [place] of Stay Meetings, dates discussed:    Additional Comments:  Pollie Friar, RN 09/19/2015, 11:41 AM

## 2015-09-19 NOTE — Progress Notes (Signed)
SLP Cancellation Note  Patient Details Name: [name] MRN: [id number] DOB: 1933-06-13   Cancelled treatment:       Reason Eval/Treat Not Completed: SLP screened, no needs identified, will sign off   Juan Quam Laurice 09/19/2015, 9:34 AM

## 2015-09-19 NOTE — Discharge Summary (Signed)
[name], is a 81 y.o. female  DOB 09/13/1933  MRN [id number].  Admission date:  09/15/2015  Admitting Physician  Waldemar Dickens, MD  Discharge Date:  09/19/2015   Primary MD  Eliezer Lofts, MD  Recommendations for primary care physician for things to follow:  - Check CBC, BMP during next visit.  Admission Diagnosis  Aphasia [R47.01] Acute encephalopathy [G93.40] Altered mental status, unspecified altered mental status type [R41.82]   Discharge Diagnosis  Aphasia [R47.01] Acute encephalopathy [G93.40] Altered mental status, unspecified altered mental status type [R41.82]   Principal Problem:   Acute encephalopathy Active Problems:   Diabetes mellitus with neurological manifestation (HCC)   Hypothyroidism   High cholesterol   HTN (hypertension)   Iron deficiency anemia   Peripheral neuropathy (HCC)   Aphasia   Pleural effusion on left   AKI (acute kidney injury) ([place])   Hyperlipidemia LDL goal <70      Past Medical History  Diagnosis Date  . Cellulitis     R ARM  . Coronary artery disease   . Hypothyroidism   . Hyperlipidemia   . Hypertension     Past Surgical History  Procedure Laterality Date  . Total abdominal hysterectomy  1993  . Mastectomy  1979    right, followed by 2 years chemo  . Total hip arthroplasty  08/2009    right  . Appendectomy    . US echocardiography  05/2009    EF 55-60%,MILD-MOD LVH,, MILD-MOD A STENOSIS,  . Nuclear stress test  07/2009    EF 69%,  MILD ISCHEMIA IN INFEROLATERAL WALL  . Coronary artery bypass graft  2008  . Cardiac catheterization         History of present illness and  Hospital Course:     Kindly see H&P for history of present illness and admission details, please review complete Labs, Consult reports and Test reports for all details in brief  HPI  from the history and physical done on the day of admission  09/15/2015 [name] is a 80 y.o. female, with a history of coronary artery disease status post CABG, breast cancer, uterine cancer, hypothyroidism, hyperlipidemia and hypertension. She presents to the emergency department today with dry heaves and aphasia. She was admitted to [facility] on 12/9 and discharged on 12/11. She was treated for community-acquired pneumonia and anemia. She received 2 units of packed red blood cells during her admission. She has been nauseated and weak since discharge. She was discharged on Levaquin but was unable tolerate it, and it was changed to doxycycline on 12/15 by her primary care physician. Reportedly she has been unable to tolerate several of her medications recently including blood pressure, antibiotics, and diabetic medications. So she has not been receiving them. On Saturday the patient began to have increased dry heaving and some vomiting episodes. She was at her normal baseline last night prior to bedtime, but this morning awoke confused. She was unable to work her CBG monitor, or remember her daughter's name. She  has difficulty with word finding. She is very lethargic.  In the emergency department her initial lactic acid was 3.33, white count is not elevated, hemoglobin has risen to 12.6, urine is positive for yeast and hemoglobin. Chest x-ray shows a left pleural effusion. 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 This was discussed with radiology who felt that it would not explain her symptoms. Neurology was consults of by the emergency department physician and recommends a stroke workup. The patient will be admitted to stepdown for acute encephalopathy, HCAP and possible stroke.   Hospital Course   Acute encephalopathy - Patient with altered mental status, incoherent speech on presentation, most likely related to dehydration, possible infection, and hypertensive urgency. - MRI with no evidence of  acute infarct, MRA with no significant stenosis. - Carotid Doppler Right side indicates a 40-59% to 60-79% internal carotid artery stenosis due to peak systolic and end diastolic. Left side indicates 40-59% internal carotid stenosis. Vertebral flow is antegrade bilaterally.  - EEG with no evidence of active seizures. - Resolved, back to baseline.  Hypertension - Patient with hypertensive urgency on presentation, will resume back on metoprolol, and Cozaar , will hold Dyazide on discharge   Diabetes mellitus - resume home medication on discharge  Hypothyroidism - Continue Synthroid  Hyperlipidemia - LDL is 140 , lipitor increased from 40 - 80 mg oral on discharge   Iron deficiency anemia - Hemoglobin is stable, since the seen by GI during previous hospitalization, continue with iron supplement  History of coronary artery disease - Denies any chest pain or shortness of brContinueh aspirin, statin, resume the beta blocker,and Cozaar  Acute on Chronic kidney disease stage III - improved with IV fluid ,  creatinine 1.27 on discharge   Nausea/vertigo - Likely related to benign positional vertigo, no acute finding on abdominal x-ray,improve the meclizine, will discharge with home PT/OT  Discharge Condition:  Stable    Follow UP  Follow-up Information    Schedule an appointment as soon as possible for a visit with Eliezer Lofts, MD.   Specialty:  Family Medicine   Why:  Posthospitalization follow-up   Contact information:   [place] [address] [phone]         Discharge Instructions  and  Discharge Medications     Discharge Instructions    Discharge instructions    Complete by:  As directed   Follow with Primary MD Eliezer Lofts, MD in 7 days   Get CBC, CMP, 2 view Chest X ray checked  by Primary MD next visit.    Activity: As tolerated with Full fall precautions use walker/cane & assistance as needed   Disposition Home    Diet: Heart  Healthy , Carbohydrate modified, with feeding assistance and aspiration precautions.  For Heart failure patients - Check your Weight same time everyday, if you gain over 2 pounds, or you develop in leg swelling, experience more shortness of breath or chest pain, call your Primary MD immediately. Follow Cardiac Low Salt Diet and 1.5 lit/day fluid restriction.   On your next visit with your primary care physician please Get Medicines reviewed and adjusted.   Please request your Prim.MD to go over all Hospital Tests and Procedure/Radiological results at the follow up, please get all Hospital records sent to your Prim MD by signing hospital release before you go home.   If you experience worsening of your admission symptoms, develop shortness of breath, life  threatening emergency, suicidal or homicidal thoughts you must seek medical attention immediately by calling 911 or calling your MD immediately  if symptoms less severe.  You Must read complete instructions/literature along with all the possible adverse reactions/side effects for all the Medicines you take and that have been prescribed to you. Take any new Medicines after you have completely understood and accpet all the possible adverse reactions/side effects.   Do not drive, operating heavy machinery, perform activities at heights, swimming or participation in water activities or provide baby sitting services if your were admitted for syncope or siezures until you have seen by Primary MD or a Neurologist and advised to do so again.  Do not drive when taking Pain medications.    Do not take more than prescribed Pain, Sleep and Anxiety Medications  Special Instructions: If you have smoked or chewed Tobacco  in the last 2 yrs please stop smoking, stop any regular Alcohol  and or any Recreational drug use.  Wear Seat belts while driving.   Please note  You were cared for by a hospitalist during your hospital stay. If you have any questions  about your discharge medications or the care you received while you were in the hospital after you are discharged, you can call the unit and asked to speak with the hospitalist on call if the hospitalist that took care of you is not available. Once you are discharged, your primary care physician will handle any further medical issues. Please note that NO REFILLS for any discharge medications will be authorized once you are discharged, as it is imperative that you return to your primary care physician (or establish a relationship with a primary care physician if you do not have one) for your aftercare needs so that they can reassess your need for medications and monitor your lab values.     Increase activity slowly    Complete by:  As directed             Medication List    STOP taking these medications        triamterene-hydrochlorothiazide 37.5-25 MG capsule  Commonly known as:  DYAZIDE      TAKE these medications        aspirin 81 MG tablet  Take 81 mg by mouth daily.     atorvastatin 80 MG tablet  Commonly known as:  LIPITOR  Take 1 tablet (80 mg total) by mouth daily.     cyanocobalamin 1000 MCG tablet  Take 100 mcg by mouth once a week. Reported on 09/11/2015     gabapentin 100 MG capsule  Commonly known as:  NEURONTIN  Take 100 mg by mouth 3 (three) times daily. Reported on 09/11/2015     glipiZIDE 10 MG 24 hr tablet  Commonly known as:  GLUCOTROL XL  TAKE 1 TABLET BY MOUTH EVERY DAY     Iron 325 (65 FE) MG Tabs  Take 1 tablet by mouth 2 (two) times daily. Reported on 09/11/2015     levothyroxine 50 MCG tablet  Commonly known as:  SYNTHROID, LEVOTHROID  TAKE 1 TABLET BY MOUTH EVERY DAY     losartan 50 MG tablet  Commonly known as:  COZAAR  Take 1 tablet (50 mg total) by mouth daily.     meclizine 25 MG tablet  Commonly known as:  ANTIVERT  Take 1 tablet (25 mg total) by mouth 3 (three) times daily as needed for dizziness.     metoprolol succinate 25 MG 24  hr  tablet  Commonly known as:  TOPROL-XL  Take 1 tablet (25 mg total) by mouth 2 (two) times daily.     nitroGLYCERIN 0.4 MG SL tablet  Commonly known as:  NITROSTAT  Place 1 tablet (0.4 mg total) under the tongue every 5 (five) minutes as needed for chest pain (x 3 tabs daily).     ondansetron 4 MG disintegrating tablet  Commonly known as:  ZOFRAN ODT  Take 1 tablet (4 mg total) by mouth every 8 (eight) hours as needed for nausea or vomiting.     pioglitazone 30 MG tablet  Commonly known as:  ACTOS  Take 30 mg by mouth daily. Reported on 09/11/2015     senna-docusate 8.6-50 MG tablet  Commonly known as:  Senokot-S  Take 1 tablet by mouth at bedtime as needed for mild constipation.     sertraline 25 MG tablet  Commonly known as:  ZOLOFT  TAKE 1 TABLET (25 MG TOTAL) BY MOUTH DAILY.          Diet and Activity recommendation: See Discharge Instructions above   Consults obtained -  neurology   cardiology  Major procedures and Radiology Reports - PLEASE review detailed and final reports for all details, in brief -      Dg Chest 2 View  09/05/2015  CLINICAL DATA:  Shortness of breath for 1 week EXAM: CHEST  2 VIEW COMPARISON:  October 11, 2014 FINDINGS: There is consolidation in the lateral and posterior left base regions with small left effusion. Lungs elsewhere clear. Heart is mildly enlarged with pulmonary vascularity within normal limits. Patient is status post coronary artery bypass grafting. No adenopathy. There is a hiatal hernia present. There is atherosclerotic change throughout the aorta. No bone lesions. IMPRESSION: Infiltrate left base with small left effusion. Moderate hiatal hernia, stable. Stable cardiomegaly. Followup PA and lateral chest radiographs recommended in 3-4 weeks following trial of antibiotic therapy to ensure resolution and exclude underlying malignancy. Electronically Signed   By: Lowella Grip III M.D.   On: 09/05/2015 13:41   Ct Head Wo  Contrast  09/15/2015  CLINICAL DATA:  81 year old hypertensive female recently discharged from hospital for treatment of pneumonia. Presenting with aphasia and confusion. Initial encounter. EXAM: CT HEAD WITHOUT CONTRAST TECHNIQUE: Contiguous axial images were obtained from the base of the skull through the vertex without intravenous contrast. COMPARISON:  05/02/2008. FINDINGS: Slightly dense appearance of the basilar artery/basilar tip which appears different than the prior CT and in the proper clinical setting, thrombosis could not be excluded. Alternatively, this may be related to atherosclerotic changes. Otherwise no CT evidence of large acute infarct. Moderate small vessel disease type changes. No intracranial hemorrhage. Global atrophy without hydrocephalus. No intracranial mass lesion noted on this unenhanced exam. Vascular calcifications. Mastoid air cells, middle ear cavities and visualized paranasal sinuses are clear. Post lens replacement.  Exophthalmos. IMPRESSION: Slightly dense appearance of the basilar artery/basilar tip which appears different than the prior CT and in the proper clinical setting, thrombosis could not be excluded. Alternatively, this may be related to atherosclerotic changes. Otherwise no CT evidence of large acute infarct. Moderate small vessel disease type changes. No intracranial hemorrhage. Global atrophy without hydrocephalus. Electronically Signed   By: Genia Del M.D.   On: 09/15/2015 13:46   Mr Brain Wo Contrast  09/15/2015  CLINICAL DATA:  82 year old hypertensive female with aphasia and confusion. Subsequent encounter. EXAM: MRI HEAD WITHOUT CONTRAST MRA HEAD WITHOUT CONTRAST TECHNIQUE: Multiplanar, multiecho pulse sequences of  the brain and surrounding structures were obtained without intravenous contrast. Angiographic images of the head were obtained using MRA technique without contrast. COMPARISON:  09/15/2015 CT. FINDINGS: MRI HEAD FINDINGS Exam is motion  degraded. No acute infarct or intracranial hemorrhage. Moderate small vessel disease type changes. Global atrophy without hydrocephalus. No intracranial mass lesion noted on this unenhanced exam. Major intracranial vascular structures are patent. Post lens replacement.  Exophthalmos. Partially empty sella incidentally noted. Cervical medullary junction and pineal region unremarkable. MRA HEAD FINDINGS Exam is motion degraded. Mild to moderate narrowing at the junction of the left internal carotid artery cavernous and supraclinoid segment Mild narrowing right internal carotid artery cavernous and supraclinoid segment. Moderate focal narrowing mid aspect A1 segment right anterior cerebral artery. Mild to moderate narrowing mid to distal M1 segment right middle cerebral artery. Middle cerebral artery branch vessel moderate narrowing and irregularity bilaterally. Mild to slightly moderate narrowing distal right vertebral artery. Mild narrowing distal left vertebral artery. Nonvisualized right posterior inferior cerebellar artery and left anterior inferior cerebellar artery. No significant stenosis of the basilar artery. Moderate narrowing mid aspect right posterior cerebral artery P2 segment. Mild narrowing distal branches posterior cerebral artery bilaterally. Mild narrowing proximal left posterior cerebral artery. No aneurysm noted. IMPRESSION: MRI HEAD Exam is motion degraded. No acute infarct. Moderate small vessel disease type changes. Global atrophy without hydrocephalus. MRA HEAD FINDINGS Exam is motion degraded. No significant stenosis of the basilar artery. Scattered intracranial atherosclerotic type changes as detailed above. Electronically Signed   By: Genia Del M.D.   On: 09/15/2015 19:19   Dg Chest Portable 1 View  09/15/2015  CLINICAL DATA:  Altered mental status, recent pneumonia EXAM: PORTABLE CHEST 1 VIEW COMPARISON:  09/05/2015 FINDINGS: Cardiomegaly again noted. Status post CABG. There is  small left pleural effusion with left basilar atelectasis or infiltrate. No pulmonary edema. IMPRESSION: Small left pleural effusion left basilar atelectasis or infiltrate. No pulmonary edema. Electronically Signed   By: Lahoma Crocker M.D.   On: 09/15/2015 13:29   Dg Abd Portable 1v  09/18/2015  CLINICAL DATA:  Nausea this morning.  No pain. EXAM: PORTABLE ABDOMEN - 1 VIEW COMPARISON:  Chest x-ray 09/05/2015 FINDINGS: Stable opacification of the left base. Bowel gas pattern is nonobstructive. Multiple surgical clips over the lower abdomen and pelvis. Calcified plaque over the abdominal aorta and right iliac artery. Right total hip arthroplasty. Mild degenerative change of the left hip and moderate degenerative change of the spine. IMPRESSION: Nonobstructive bowel gas pattern. Electronically Signed   By: Marin Olp M.D.   On: 09/18/2015 10:12   Mr Mra Head/brain Wo Cm  09/15/2015  CLINICAL DATA:  81 year old hypertensive female with aphasia and confusion. Subsequent encounter. EXAM: MRI HEAD WITHOUT CONTRAST MRA HEAD WITHOUT CONTRAST TECHNIQUE: Multiplanar, multiecho pulse sequences of the brain and surrounding structures were obtained without intravenous contrast. Angiographic images of the head were obtained using MRA technique without contrast. COMPARISON:  09/15/2015 CT. FINDINGS: MRI HEAD FINDINGS Exam is motion degraded. No acute infarct or intracranial hemorrhage. Moderate small vessel disease type changes. Global atrophy without hydrocephalus. No intracranial mass lesion noted on this unenhanced exam. Major intracranial vascular structures are patent. Post lens replacement.  Exophthalmos. Partially empty sella incidentally noted. Cervical medullary junction and pineal region unremarkable. MRA HEAD FINDINGS Exam is motion degraded. Mild to moderate narrowing at the junction of the left internal carotid artery cavernous and supraclinoid segment Mild narrowing right internal carotid artery cavernous and  supraclinoid segment. Moderate focal narrowing mid  aspect A1 segment right anterior cerebral artery. Mild to moderate narrowing mid to distal M1 segment right middle cerebral artery. Middle cerebral artery branch vessel moderate narrowing and irregularity bilaterally. Mild to slightly moderate narrowing distal right vertebral artery. Mild narrowing distal left vertebral artery. Nonvisualized right posterior inferior cerebellar artery and left anterior inferior cerebellar artery. No significant stenosis of the basilar artery. Moderate narrowing mid aspect right posterior cerebral artery P2 segment. Mild narrowing distal branches posterior cerebral artery bilaterally. Mild narrowing proximal left posterior cerebral artery. No aneurysm noted. IMPRESSION: MRI HEAD Exam is motion degraded. No acute infarct. Moderate small vessel disease type changes. Global atrophy without hydrocephalus. MRA HEAD FINDINGS Exam is motion degraded. No significant stenosis of the basilar artery. Scattered intracranial atherosclerotic type changes as detailed above. Electronically Signed   By: Genia Del M.D.   On: 09/15/2015 19:19    Micro Results     Recent Results (from the past 240 hour(s))  Urine culture     Status: None   Collection Time: 09/15/15  2:32 PM  Result Value Ref Range Status   Specimen Description URINE, CATHETERIZED  Final   Special Requests NONE  Final   Culture >=100,000 COLONIES/mL YEAST  Final   Report Status 09/16/2015 FINAL  Final  Culture, blood (routine x 2) Call MD if unable to obtain prior to antibiotics being given     Status: None (Preliminary result)   Collection Time: 09/15/15  9:18 PM  Result Value Ref Range Status   Specimen Description BLOOD LEFT HAND  Final   Special Requests BOTTLES DRAWN AEROBIC AND ANAEROBIC 10CC   Final   Culture NO GROWTH 4 DAYS  Final   Report Status PENDING  Incomplete  Culture, blood (routine x 2) Call MD if unable to obtain prior to antibiotics being  given     Status: None (Preliminary result)   Collection Time: 09/15/15  9:30 PM  Result Value Ref Range Status   Specimen Description BLOOD LEFT HAND  Final   Special Requests BOTTLES DRAWN AEROBIC AND ANAEROBIC 8CC   Final   Culture NO GROWTH 4 DAYS  Final   Report Status PENDING  Incomplete  MRSA PCR Screening     Status: None   Collection Time: 09/15/15 10:04 PM  Result Value Ref Range Status   MRSA by PCR NEGATIVE NEGATIVE Final    Comment:        The GeneXpert MRSA Assay (FDA approved for NASAL specimens only), is one component of a comprehensive MRSA colonization surveillance program. It is not intended to diagnose MRSA infection nor to guide or monitor treatment for MRSA infections.        Today   Subjective:   Amanpreet Bonura today has no headache,no chest  or abdominal pain, nausea significantly improved , feels much better wants to go home today.   Objective:   Blood pressure 137/53, pulse 70, temperature 98.1 F (36.7 C), temperature source Oral, resp. rate 18, height 5\' 2"  (1.575 m), weight 87 kg (191 lb 12.8 oz), SpO2 99 %.  No intake or output data in the 24 hours ending 09/19/15 1324  Exam Awake Alert, Oriented x 3, No new F.N deficits, Normal affect [place].AT,PERRAL Supple Neck,No JVD, No cervical lymphadenopathy appriciated.  Symmetrical Chest wall movement, Good air movement bilaterally, CTAB RRR,No Gallops,Rubs or new Murmurs, No Parasternal Heave +ve B.Sounds, Abd Soft, Non tender, No organomegaly appriciated, No rebound -guarding or rigidity. No Cyanosis, Clubbing or edema, No new Rash or bruise  Data Review   CBC w Diff:  Lab Results  Component Value Date   WBC 8.6 09/17/2015   HGB 10.3* 09/17/2015   HCT 32.1* 09/17/2015   PLT 180 09/17/2015   LYMPHOPCT 8 09/17/2015   MONOPCT 4 09/17/2015   EOSPCT 0 09/17/2015   BASOPCT 0 09/17/2015    CMP:  Lab Results  Component Value Date   NA 137 09/19/2015   K 4.2 09/19/2015   CL 109  09/19/2015   CO2 22 09/19/2015   BUN 30* 09/19/2015   CREATININE 1.27* 09/19/2015   PROT 6.1* 09/17/2015   ALBUMIN 2.6* 09/17/2015   BILITOT 0.6 09/17/2015   ALKPHOS 79 09/17/2015   AST 15 09/17/2015   ALT 11* 09/17/2015  .   Total Time in preparing paper work, data evaluation and todays exam - 35 minutes  Chrystine Frogge M.D on 09/19/2015 at 1:24 PM  [facility]   Office  [phone]

## 2015-09-19 NOTE — Discharge Instructions (Signed)
Follow with Primary MD Eliezer Lofts, MD in 7 days   Get CBC, CMP, 2 view Chest X ray checked  by Primary MD next visit.    Activity: As tolerated with Full fall precautions use walker/cane & assistance as needed   Disposition Home    Diet: Heart Healthy , Carbohydrate modified, with feeding assistance and aspiration precautions.  For Heart failure patients - Check your Weight same time everyday, if you gain over 2 pounds, or you develop in leg swelling, experience more shortness of breath or chest pain, call your Primary MD immediately. Follow Cardiac Low Salt Diet and 1.5 lit/day fluid restriction.   On your next visit with your primary care physician please Get Medicines reviewed and adjusted.   Please request your Prim.MD to go over all Hospital Tests and Procedure/Radiological results at the follow up, please get all Hospital records sent to your Prim MD by signing hospital release before you go home.   If you experience worsening of your admission symptoms, develop shortness of breath, life threatening emergency, suicidal or homicidal thoughts you must seek medical attention immediately by calling 911 or calling your MD immediately  if symptoms less severe.  You Must read complete instructions/literature along with all the possible adverse reactions/side effects for all the Medicines you take and that have been prescribed to you. Take any new Medicines after you have completely understood and accpet all the possible adverse reactions/side effects.   Do not drive, operating heavy machinery, perform activities at heights, swimming or participation in water activities or provide baby sitting services if your were admitted for syncope or siezures until you have seen by Primary MD or a Neurologist and advised to do so again.  Do not drive when taking Pain medications.    Do not take more than prescribed Pain, Sleep and Anxiety Medications  Special Instructions: If you have smoked or  chewed Tobacco  in the last 2 yrs please stop smoking, stop any regular Alcohol  and or any Recreational drug use.  Wear Seat belts while driving.   Please note  You were cared for by a hospitalist during your hospital stay. If you have any questions about your discharge medications or the care you received while you were in the hospital after you are discharged, you can call the unit and asked to speak with the hospitalist on call if the hospitalist that took care of you is not available. Once you are discharged, your primary care physician will handle any further medical issues. Please note that NO REFILLS for any discharge medications will be authorized once you are discharged, as it is imperative that you return to your primary care physician (or establish a relationship with a primary care physician if you do not have one) for your aftercare needs so that they can reassess your need for medications and monitor your lab values.

## 2015-09-19 NOTE — Progress Notes (Signed)
Physical Therapy Treatment Patient Details Name: [name] MRN: [id number] DOB: [birth year] Today's Date: 09/19/2015    History of Present Illness pt presents with Encephaolpathy and recent admit for PNA, with N/V from antibiotics.  pt with hx of CAD< CABG, Breast CA, Uterine CA, HTN, and DM.      PT Comments    Patient improved overall. Some nausea and slight unsteadiness remains (daughters feel it is nearly her "baseline" unsteadiness due to [facility] and neuropathy). Lengthy discussion/education with pt and family re: uses of wheelchair vs transport chair vs 2 wheel RW vs 4 wheel RW with seat. (They had numerous questions re: different needs and which would be best). Ultimately they decided they wanted to look into various 4 wheeled walkers and when they find what they want, they know to ask her primary MD to assist with prescription for insurance coverage. (Also told them the DME provider may be able to assist with this piece). All questions answered and pt/family feel prepared for pt to discharge to daughter's home.    Follow Up Recommendations  Home health PT;Supervision/Assistance - 24 hour     Equipment Recommendations  None recommended by PT (discussed and dtrs to look into 4wheel RW)    Recommendations for Other Services       Precautions / Restrictions Precautions Precautions: Fall    Mobility  Bed Mobility Overal bed mobility: Modified Independent Bed Mobility: Rolling;Sidelying to Sit              Transfers Overall transfer level: Needs assistance Equipment used: 1 person hand held assist Transfers: Sit to/from Stand Sit to Stand: Supervision         General transfer comment: no visual targets needed  Ambulation/Gait Ambulation/Gait assistance: Min guard Ambulation Distance (Feet): 80 Feet (seated rest; 100) Assistive device: None Gait Pattern/deviations: Step-through pattern;Decreased stride length;Drifts right/left Gait velocity: cautious,  improved velocity   General Gait Details: drifting but without LOB; daughters report this is her baseline   Stairs Stairs: Yes Stairs assistance: Modified independent (Device/Increase time) Stair Management: Two rails;Step to pattern;Forwards Number of Stairs: 5    Wheelchair Mobility    Modified Rankin (Stroke Patients Only)       Balance           Standing balance support: No upper extremity supported Standing balance-Leahy Scale: Fair                      Cognition Arousal/Alertness: Awake/alert Behavior During Therapy: WFL for tasks assessed/performed Overall Cognitive Status: Within Functional Limits for tasks assessed                      Exercises      General Comments General comments (skin integrity, edema, etc.): pt reports nausea and imbalance have been much better since taking meclizine; nausea occured with movement at 4/10 level at worst; subsides when she closes her eyes. Modified Hallpike-Dix negative Rt and Lt. Educated pt and daughter to continue to use meclizine today (for getting home). May begin to use "as needed" as MD prescribed. If symptoms worsen, take steadily for several days and then try to taper again.      Pertinent Vitals/Pain Pain Assessment: No/denies pain    Home Living                      Prior Function            PT Goals (current goals  can now be found in the care plan section) Acute Rehab PT Goals Patient Stated Goal: feel better Time For Goal Achievement: 09/30/15 Progress towards PT goals: Progressing toward goals    Frequency  Min 3X/week    PT Plan Current plan remains appropriate;Equipment recommendations need to be updated    Co-evaluation             End of Session Equipment Utilized During Treatment: Gait belt Activity Tolerance: Patient tolerated treatment well Patient left: with call bell/phone within reach;with family/visitor present;in bed (MD in room)     Time:  1212-1309 (MD interrupted session x 15 min; charges to reflect) PT Time Calculation (min) (ACUTE ONLY): 57 min  Charges:  $Gait Training: 23-37 mins $Self Care/Home Management: 8-22                    G Codes:      Raunak Antuna 09-27-2015, 1:23 PM Pager [phone]

## 2015-09-20 LAB — CULTURE, BLOOD (ROUTINE X 2)
CULTURE: NO GROWTH
CULTURE: NO GROWTH

## 2015-09-22 DIAGNOSIS — J189 Pneumonia, unspecified organism: Secondary | ICD-10-CM | POA: Diagnosis not present

## 2015-09-22 DIAGNOSIS — E1149 Type 2 diabetes mellitus with other diabetic neurological complication: Secondary | ICD-10-CM | POA: Diagnosis not present

## 2015-09-22 DIAGNOSIS — I10 Essential (primary) hypertension: Secondary | ICD-10-CM | POA: Diagnosis not present

## 2015-09-22 DIAGNOSIS — I2510 Atherosclerotic heart disease of native coronary artery without angina pectoris: Secondary | ICD-10-CM | POA: Diagnosis not present

## 2015-09-22 DIAGNOSIS — Z853 Personal history of malignant neoplasm of breast: Secondary | ICD-10-CM | POA: Diagnosis not present

## 2015-09-22 DIAGNOSIS — G609 Hereditary and idiopathic neuropathy, unspecified: Secondary | ICD-10-CM | POA: Diagnosis not present

## 2015-09-23 ENCOUNTER — Telehealth: Payer: Self-pay | Admitting: *Deleted

## 2015-09-23 NOTE — Telephone Encounter (Signed)
Transitional Care call attempted.  Left message for patient to return call. 

## 2015-09-24 ENCOUNTER — Telehealth: Payer: Self-pay | Admitting: Hematology and Oncology

## 2015-09-24 ENCOUNTER — Telehealth: Payer: Self-pay

## 2015-09-24 DIAGNOSIS — Z853 Personal history of malignant neoplasm of breast: Secondary | ICD-10-CM | POA: Diagnosis not present

## 2015-09-24 DIAGNOSIS — G609 Hereditary and idiopathic neuropathy, unspecified: Secondary | ICD-10-CM | POA: Diagnosis not present

## 2015-09-24 DIAGNOSIS — I2510 Atherosclerotic heart disease of native coronary artery without angina pectoris: Secondary | ICD-10-CM | POA: Diagnosis not present

## 2015-09-24 DIAGNOSIS — J189 Pneumonia, unspecified organism: Secondary | ICD-10-CM | POA: Diagnosis not present

## 2015-09-24 DIAGNOSIS — E1149 Type 2 diabetes mellitus with other diabetic neurological complication: Secondary | ICD-10-CM | POA: Diagnosis not present

## 2015-09-24 DIAGNOSIS — I10 Essential (primary) hypertension: Secondary | ICD-10-CM | POA: Diagnosis not present

## 2015-09-24 NOTE — Telephone Encounter (Signed)
Left messag for [name], that I have spoken with [name] and we are going to restart the triamterene today.  We will re-evaluate [name] BP and swelling here at the office on Friday 12/30 when we see her for her hospital follow up.

## 2015-09-24 NOTE — Telephone Encounter (Signed)
Santiago Glad pts daughter (DPR signed) left v/m; triamterene is not on discharge papers from hospital when pt was discharged on 09/19/15. Pt had been taking triamterene prior to admission.Today pts ankles are swelling and Santiago Glad wants to know if pt should be taking triamterene. Santiago Glad request cb. Pt has F/U appt with Dr Diona Browner on 09/26/15.

## 2015-09-24 NOTE — Telephone Encounter (Signed)
new patient appt-left message for patient and gave np appt for 01/05 @ 11:15 w/Dr. Alvy Bimler

## 2015-09-24 NOTE — Telephone Encounter (Signed)
[name] OT with [facility] left v/m; pt was seen earlier today and pts BP was elevated today; BP today was 170/70 ; pt had taken BP med 30 mins prior to taking BP. Family reports that BP running high since discharged from hospital last week. [name] also wants to know if pt should be taking triamterene.[name] request cb. Have sent one message to Dr Lorelei Pont from pts daughter about ankle swelling.

## 2015-09-24 NOTE — Telephone Encounter (Signed)
Chart reviewed. i think should be safe to resume triamterene - then they can recheck BP and swelling on Friday when in the office.

## 2015-09-24 NOTE — Telephone Encounter (Signed)
Transitional care call attempted.  Left message for patient to return call. 

## 2015-09-24 NOTE — Telephone Encounter (Signed)
Per prior

## 2015-09-24 NOTE — Telephone Encounter (Signed)
Santiago Glad (daughter) notified as instructed by telephone.Marland Kitchen

## 2015-09-25 NOTE — Telephone Encounter (Signed)
Left message for [name] with [facility] that Triamterene-HCTZ 37.5-25 mg is correct.  Advised to have Ms. Sainthilaire continue taking this medication for now and adjustments can be made by Dr. Diona Browner at her office visit tomorrow.

## 2015-09-25 NOTE — Telephone Encounter (Signed)
This is the patient's prior dosage and medication. Please continue as ordered. Comments are noted.   Any adjustments can be made by her PCP tomorrow.

## 2015-09-25 NOTE — Telephone Encounter (Signed)
Kim with [facility] left v/m; Kim added triamterene to their med list for pt and it came up triamterene HCTZ 37.5-25 mg cap; Kim wants to verify that is the correct med and dosage; [name] also noted on their computer a possible interaction between triamterene HCTZ and losartan K; concurrent use of those 2 meds may result in hyperkalemia.Kim request cb.

## 2015-09-25 NOTE — Telephone Encounter (Signed)
Transitional care call attempted.  Left message for patient to return call. 

## 2015-09-26 ENCOUNTER — Ambulatory Visit (INDEPENDENT_AMBULATORY_CARE_PROVIDER_SITE_OTHER): Payer: Medicare Other | Admitting: Family Medicine

## 2015-09-26 ENCOUNTER — Encounter: Payer: Self-pay | Admitting: Family Medicine

## 2015-09-26 VITALS — BP 150/61 | HR 64 | Temp 97.5°F | Ht 62.5 in | Wt 186.2 lb

## 2015-09-26 DIAGNOSIS — I2510 Atherosclerotic heart disease of native coronary artery without angina pectoris: Secondary | ICD-10-CM | POA: Diagnosis not present

## 2015-09-26 DIAGNOSIS — I6523 Occlusion and stenosis of bilateral carotid arteries: Secondary | ICD-10-CM | POA: Diagnosis not present

## 2015-09-26 DIAGNOSIS — Z853 Personal history of malignant neoplasm of breast: Secondary | ICD-10-CM | POA: Diagnosis not present

## 2015-09-26 DIAGNOSIS — D631 Anemia in chronic kidney disease: Secondary | ICD-10-CM

## 2015-09-26 DIAGNOSIS — G9340 Encephalopathy, unspecified: Secondary | ICD-10-CM

## 2015-09-26 DIAGNOSIS — N189 Chronic kidney disease, unspecified: Secondary | ICD-10-CM

## 2015-09-26 DIAGNOSIS — I10 Essential (primary) hypertension: Secondary | ICD-10-CM | POA: Diagnosis not present

## 2015-09-26 DIAGNOSIS — J189 Pneumonia, unspecified organism: Secondary | ICD-10-CM

## 2015-09-26 DIAGNOSIS — G609 Hereditary and idiopathic neuropathy, unspecified: Secondary | ICD-10-CM | POA: Diagnosis not present

## 2015-09-26 DIAGNOSIS — E1149 Type 2 diabetes mellitus with other diabetic neurological complication: Secondary | ICD-10-CM | POA: Diagnosis not present

## 2015-09-26 DIAGNOSIS — I6529 Occlusion and stenosis of unspecified carotid artery: Secondary | ICD-10-CM | POA: Insufficient documentation

## 2015-09-26 DIAGNOSIS — N179 Acute kidney failure, unspecified: Secondary | ICD-10-CM | POA: Diagnosis not present

## 2015-09-26 NOTE — Progress Notes (Signed)
Pre visit review using our clinic review tool, if applicable. No additional management support is needed unless otherwise documented below in the visit note. 

## 2015-09-26 NOTE — Patient Instructions (Addendum)
Restart HCTZ/triamterene daily. Continue PT and OT. Follow up in 4 weeks 30 min OV for CXR and re-eval.

## 2015-09-26 NOTE — Telephone Encounter (Signed)
Unable to reach patient after multiple attempts.  Follow up scheduled today at 1545.

## 2015-09-26 NOTE — Progress Notes (Signed)
Subjective:    Patient ID: [name], female    DOB: Feb 20, 1933, 81 y.o.   MRN: [id number]  HPI  81 year old female with a history of coronary artery disease status post CABG, breast cancer, uterine cancer, hypothyroidism, hyperlipidemia and hypertension presents for hospital follow up for the second hospitalization in 2 weeks.  First hospitalization was from 12/9 to 12/11 for CA PNA and anemia. Following discharge she was unable to take meds due to nausea and vomiting. Seen in follow on 12/15. Changed to doxycycline due to felt levaquin may have been causing nausea.   On 12/19  She was admitted for acute encephalopathy and altered mental status. In the emergency department her initial lactic acid was 3.33, white count  not elevated, hemoglobin at 12.6, urine is positive for yeast and hemoglobin. Chest x-ray showed a left pleural effusion. 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    Neurology was consulted abd she was admitted for stroke workup.  Discharged on 12/23.  Hospital Course   Acute encephalopathy - Patient with altered mental status, incoherent speech on presentation, most likely related to dehydration, possible infection, and hypertensive urgency. - MRI with no evidence of acute infarct, MRA with no significant stenosis. - Carotid Doppler Right side indicates a 40-59% to 60-79% internal carotid artery stenosis due to peak systolic and end diastolic. Left side indicates 40-59% internal carotid stenosis. Vertebral flow is antegrade bilaterally.  - EEG with no evidence of active seizures.  Hypertension - Patient with hypertensive urgency on presentation, will resume back on metoprolol, and Cozaar , will hold Dyazide on discharge   Diabetes mellitus - resume home medication on discharge  Hypothyroidism - Continue Synthroid  Hyperlipidemia - LDL is 140 , lipitor increased from 40 - 80 mg oral on discharge   Iron  deficiency anemia - Hemoglobin is stable, since the seen by GI during previous hospitalization, continue with iron supplement  History of coronary artery disease - Continue aspirin, statin, resume the beta blocker,and Cozaar  Acute on Chronic kidney disease stage III - improved with IV fluid , creatinine 1.27 on discharge   Nausea/vertigo - Likely related to benign positional vertigo, no acute finding on abdominal x-ray,improve the meclizine, will discharge with home PT/OT   Since discharge:  She has completed antibiotics. No N/V. No fever. No remaining confusion. Able to take BP meds.  BP at home today 127-134/60-49 BP Readings from Last 3 Encounters:  09/26/15 150/61  09/19/15 137/53  09/11/15 199/86  Her main symptom at this time is fatigue.   She is getting home PT/OT.    Review of Systems  Constitutional: Negative for fever and fatigue.  HENT: Negative for ear pain.   Eyes: Negative for pain.  Respiratory: Negative for chest tightness and shortness of breath.   Cardiovascular: Negative for chest pain, palpitations and leg swelling.  Gastrointestinal: Negative for abdominal pain.  Genitourinary: Negative for dysuria.       Objective:   Physical Exam  Constitutional: Vital signs are normal. She appears well-developed and well-nourished. She appears lethargic. She is cooperative.  Non-toxic appearance. She appears ill. No distress.  HENT:  Head: Normocephalic.  Right Ear: Hearing, tympanic membrane, external ear and ear canal normal. Tympanic membrane is not erythematous, not retracted and not bulging.  Left Ear: Hearing, tympanic membrane, external ear and ear canal normal. Tympanic membrane is not erythematous, not retracted and not bulging.  Nose: No mucosal edema or  rhinorrhea. Right sinus exhibits no maxillary sinus tenderness and no frontal sinus tenderness. Left sinus exhibits no maxillary sinus tenderness and no frontal sinus tenderness.  Mouth/Throat: Uvula is  midline, oropharynx is clear and moist and mucous membranes are normal.  Eyes: Conjunctivae, EOM and lids are normal. Pupils are equal, round, and reactive to light. Lids are everted and swept, no foreign bodies found.  Neck: Trachea normal and normal range of motion. Neck supple. Carotid bruit is not present. No thyroid mass and no thyromegaly present.  Cardiovascular: Normal rate, regular rhythm, S1 normal, S2 normal, normal heart sounds, intact distal pulses and normal pulses.  Exam reveals no gallop and no friction rub.   No murmur heard. Pulmonary/Chest: Effort normal and breath sounds normal. No tachypnea. No respiratory distress. She has no decreased breath sounds. She has no wheezes. She has no rhonchi. She has no rales.  Abdominal: Soft. Normal appearance and bowel sounds are normal. There is no tenderness.  Neurological: She appears lethargic.  Skin: Skin is warm, dry and intact. No rash noted.  Psychiatric: Her speech is normal and behavior is normal. Judgment and thought content normal. Her mood appears not anxious. Cognition and memory are normal. She does not exhibit a depressed mood.          Assessment & Plan:

## 2015-09-26 NOTE — Telephone Encounter (Signed)
Okay to use both med. Will follow potassium as we have in past.

## 2015-09-28 HISTORY — PX: HIP FRACTURE SURGERY: SHX118

## 2015-09-29 DIAGNOSIS — I10 Essential (primary) hypertension: Secondary | ICD-10-CM | POA: Diagnosis not present

## 2015-09-29 DIAGNOSIS — J189 Pneumonia, unspecified organism: Secondary | ICD-10-CM | POA: Diagnosis not present

## 2015-09-29 DIAGNOSIS — I2510 Atherosclerotic heart disease of native coronary artery without angina pectoris: Secondary | ICD-10-CM | POA: Diagnosis not present

## 2015-09-29 DIAGNOSIS — E1149 Type 2 diabetes mellitus with other diabetic neurological complication: Secondary | ICD-10-CM | POA: Diagnosis not present

## 2015-09-29 DIAGNOSIS — Z853 Personal history of malignant neoplasm of breast: Secondary | ICD-10-CM | POA: Diagnosis not present

## 2015-10-01 DIAGNOSIS — J189 Pneumonia, unspecified organism: Secondary | ICD-10-CM | POA: Diagnosis not present

## 2015-10-01 DIAGNOSIS — E1149 Type 2 diabetes mellitus with other diabetic neurological complication: Secondary | ICD-10-CM | POA: Diagnosis not present

## 2015-10-01 DIAGNOSIS — I10 Essential (primary) hypertension: Secondary | ICD-10-CM | POA: Diagnosis not present

## 2015-10-01 DIAGNOSIS — Z853 Personal history of malignant neoplasm of breast: Secondary | ICD-10-CM | POA: Diagnosis not present

## 2015-10-01 DIAGNOSIS — I2510 Atherosclerotic heart disease of native coronary artery without angina pectoris: Secondary | ICD-10-CM | POA: Diagnosis not present

## 2015-10-02 ENCOUNTER — Ambulatory Visit (HOSPITAL_BASED_OUTPATIENT_CLINIC_OR_DEPARTMENT_OTHER): Payer: Medicare Other

## 2015-10-02 ENCOUNTER — Ambulatory Visit (HOSPITAL_BASED_OUTPATIENT_CLINIC_OR_DEPARTMENT_OTHER): Payer: Medicare Other | Admitting: Hematology and Oncology

## 2015-10-02 ENCOUNTER — Other Ambulatory Visit: Payer: Self-pay | Admitting: *Deleted

## 2015-10-02 ENCOUNTER — Ambulatory Visit (HOSPITAL_COMMUNITY)
Admission: RE | Admit: 2015-10-02 | Discharge: 2015-10-02 | Disposition: A | Discharge status: Home / Self Care | Payer: Medicare Other | Source: Ambulatory Visit | Attending: Hematology and Oncology | Admitting: Hematology and Oncology

## 2015-10-02 ENCOUNTER — Telehealth: Payer: Self-pay | Admitting: Hematology and Oncology

## 2015-10-02 ENCOUNTER — Encounter: Payer: Self-pay | Admitting: Hematology and Oncology

## 2015-10-02 VITALS — BP 144/39 | HR 66 | Temp 97.5°F | Resp 18 | Wt 184.1 lb

## 2015-10-02 VITALS — BP 126/32 | HR 66 | Temp 97.8°F | Resp 16

## 2015-10-02 DIAGNOSIS — Z8542 Personal history of malignant neoplasm of other parts of uterus: Secondary | ICD-10-CM | POA: Diagnosis not present

## 2015-10-02 DIAGNOSIS — D631 Anemia in chronic kidney disease: Secondary | ICD-10-CM

## 2015-10-02 DIAGNOSIS — D509 Iron deficiency anemia, unspecified: Secondary | ICD-10-CM

## 2015-10-02 DIAGNOSIS — N184 Chronic kidney disease, stage 4 (severe): Secondary | ICD-10-CM | POA: Diagnosis not present

## 2015-10-02 DIAGNOSIS — Z853 Personal history of malignant neoplasm of breast: Secondary | ICD-10-CM

## 2015-10-02 DIAGNOSIS — N189 Chronic kidney disease, unspecified: Secondary | ICD-10-CM

## 2015-10-02 LAB — COMPREHENSIVE METABOLIC PANEL
ALBUMIN: 3.1 g/dL — AB (ref 3.5–5.0)
ALK PHOS: 76 U/L (ref 40–150)
ALT: 11 U/L (ref 0–55)
ANION GAP: 12 meq/L — AB (ref 3–11)
AST: 13 U/L (ref 5–34)
BILIRUBIN TOTAL: 0.41 mg/dL (ref 0.20–1.20)
BUN: 40.7 mg/dL — ABNORMAL HIGH (ref 7.0–26.0)
CO2: 23 mEq/L (ref 22–29)
CREATININE: 1.9 mg/dL — AB (ref 0.6–1.1)
Calcium: 9 mg/dL (ref 8.4–10.4)
Chloride: 108 mEq/L (ref 98–109)
EGFR: 24 mL/min/{1.73_m2} — AB (ref >90)
GLUCOSE: 204 mg/dL — AB (ref 70–140)
Potassium: 4.8 mEq/L (ref 3.5–5.1)
SODIUM: 142 meq/L (ref 136–145)
TOTAL PROTEIN: 6.1 g/dL — AB (ref 6.4–8.3)

## 2015-10-02 LAB — CBC & DIFF AND RETIC
BASO%: 0.3 % (ref 0.0–2.0)
Basophils Absolute: 0 10*3/uL (ref 0.0–0.1)
EOS%: 2.5 % (ref 0.0–7.0)
Eosinophils Absolute: 0.2 10*3/uL (ref 0.0–0.5)
HCT: 25.7 % — ABNORMAL LOW (ref 34.8–46.6)
HGB: 7.8 g/dL — ABNORMAL LOW (ref 11.6–15.9)
IMMATURE RETIC FRACT: 13.2 % — AB (ref 1.60–10.00)
LYMPH#: 1.3 10*3/uL (ref 0.9–3.3)
LYMPH%: 14.8 % (ref 14.0–49.7)
MCH: 26.5 pg (ref 25.1–34.0)
MCHC: 30.4 g/dL — AB (ref 31.5–36.0)
MCV: 87.4 fL (ref 79.5–101.0)
MONO#: 0.4 10*3/uL (ref 0.1–0.9)
MONO%: 4 % (ref 0.0–14.0)
NEUT#: 6.8 10*3/uL — ABNORMAL HIGH (ref 1.5–6.5)
NEUT%: 78.4 % — AB (ref 38.4–76.8)
PLATELETS: 195 10*3/uL (ref 145–400)
RBC: 2.94 10*6/uL — AB (ref 3.70–5.45)
RDW: 21 % — AB (ref 11.2–14.5)
RETIC %: 3.96 % — AB (ref 0.70–2.10)
RETIC CT ABS: 116.42 10*3/uL — AB (ref 33.70–90.70)
WBC: 8.7 10*3/uL (ref 3.9–10.3)

## 2015-10-02 LAB — PREPARE RBC (CROSSMATCH)

## 2015-10-02 LAB — HOLD TUBE, BLOOD BANK

## 2015-10-02 MED ORDER — SODIUM CHLORIDE 0.9 % IV SOLN
250.0000 mL | Freq: Once | INTRAVENOUS | Status: AC
  Administered 2015-10-02: 250 mL via INTRAVENOUS

## 2015-10-02 MED ORDER — ACETAMINOPHEN 325 MG PO TABS
ORAL_TABLET | ORAL | Status: AC
  Filled 2015-10-02: qty 2

## 2015-10-02 MED ORDER — DIPHENHYDRAMINE HCL 25 MG PO CAPS
ORAL_CAPSULE | ORAL | Status: AC
  Filled 2015-10-02: qty 1

## 2015-10-02 MED ORDER — DIPHENHYDRAMINE HCL 25 MG PO CAPS
25.0000 mg | ORAL_CAPSULE | Freq: Once | ORAL | Status: AC
  Administered 2015-10-02: 25 mg via ORAL

## 2015-10-02 MED ORDER — ACETAMINOPHEN 325 MG PO TABS
650.0000 mg | ORAL_TABLET | Freq: Once | ORAL | Status: AC
  Administered 2015-10-02: 650 mg via ORAL

## 2015-10-02 NOTE — Patient Instructions (Signed)

## 2015-10-02 NOTE — Patient Instructions (Signed)
Darbepoetin Alfa injection What is this medicine? DARBEPOETIN ALFA (dar be POE e tin AL fa) helps your body make more red blood cells. It is used to treat anemia caused by chronic kidney failure and chemotherapy. This medicine may be used for other purposes; ask your health care provider or pharmacist if you have questions. What should I tell my health care provider before I take this medicine? They need to know if you have any of these conditions: -blood clotting disorders or history of blood clots -cancer patient not on chemotherapy -cystic fibrosis -heart disease, such as angina, heart failure, or a history of a heart attack -hemoglobin level of 12 g/dL or greater -high blood pressure -low levels of folate, iron, or vitamin B12 -seizures -an unusual or allergic reaction to darbepoetin, erythropoietin, albumin, hamster proteins, latex, other medicines, foods, dyes, or preservatives -pregnant or trying to get pregnant -breast-feeding How should I use this medicine? This medicine is for injection into a vein or under the skin. It is usually given by a health care professional in a hospital or clinic setting. If you get this medicine at home, you will be taught how to prepare and give this medicine. Do not shake the solution before you withdraw a dose. Use exactly as directed. Take your medicine at regular intervals. Do not take your medicine more often than directed. It is important that you put your used needles and syringes in a special sharps container. Do not put them in a trash can. If you do not have a sharps container, call your pharmacist or healthcare provider to get one. Talk to your pediatrician regarding the use of this medicine in children. While this medicine may be used in children as young as 1 year for selected conditions, precautions do apply. Overdosage: If you think you have taken too much of this medicine contact a poison control center or emergency room at once. NOTE:  This medicine is only for you. Do not share this medicine with others. What if I miss a dose? If you miss a dose, take it as soon as you can. If it is almost time for your next dose, take only that dose. Do not take double or extra doses. What may interact with this medicine? Do not take this medicine with any of the following medications: -epoetin alfa This list may not describe all possible interactions. Give your health care provider a list of all the medicines, herbs, non-prescription drugs, or dietary supplements you use. Also tell them if you smoke, drink alcohol, or use illegal drugs. Some items may interact with your medicine. What should I watch for while using this medicine? Visit your prescriber or health care professional for regular checks on your progress and for the needed blood tests and blood pressure measurements. It is especially important for the doctor to make sure your hemoglobin level is in the desired range, to limit the risk of potential side effects and to give you the best benefit. Keep all appointments for any recommended tests. Check your blood pressure as directed. Ask your doctor what your blood pressure should be and when you should contact him or her. As your body makes more red blood cells, you may need to take iron, folic acid, or vitamin B supplements. Ask your doctor or health care provider which products are right for you. If you have kidney disease continue dietary restrictions, even though this medication can make you feel better. Talk with your doctor or health care professional about the   foods you eat and the vitamins that you take. What side effects may I notice from receiving this medicine? Side effects that you should report to your doctor or health care professional as soon as possible: -allergic reactions like skin rash, itching or hives, swelling of the face, lips, or tongue -breathing problems -changes in vision -chest pain -confusion, trouble speaking  or understanding -feeling faint or lightheaded, falls -high blood pressure -muscle aches or pains -pain, swelling, warmth in the leg -rapid weight gain -severe headaches -sudden numbness or weakness of the face, arm or leg -trouble walking, dizziness, loss of balance or coordination -seizures (convulsions) -swelling of the ankles, feet, hands -unusually weak or tired Side effects that usually do not require medical attention (report to your doctor or health care professional if they continue or are bothersome): -diarrhea -fever, chills (flu-like symptoms) -headaches -nausea, vomiting -redness, stinging, or swelling at site where injected This list may not describe all possible side effects. Call your doctor for medical advice about side effects. You may report side effects to FDA at 1-800-FDA-1088. Where should I keep my medicine? Keep out of the reach of children. Store in a refrigerator between 2 and 8 degrees C (36 and 46 degrees F). Do not freeze. Do not shake. Throw away any unused portion if using a single-dose vial. Throw away any unused medicine after the expiration date. NOTE: This sheet is a summary. It may not cover all possible information. If you have questions about this medicine, talk to your doctor, pharmacist, or health care provider.    2016, Elsevier/[employer] Standard. (2008-08-27 10:23:57)  

## 2015-10-02 NOTE — Telephone Encounter (Signed)
Patient sent back to lab and given avs report and appointments for January.

## 2015-10-03 DIAGNOSIS — J189 Pneumonia, unspecified organism: Secondary | ICD-10-CM | POA: Diagnosis not present

## 2015-10-03 DIAGNOSIS — I10 Essential (primary) hypertension: Secondary | ICD-10-CM | POA: Diagnosis not present

## 2015-10-03 DIAGNOSIS — E1149 Type 2 diabetes mellitus with other diabetic neurological complication: Secondary | ICD-10-CM | POA: Diagnosis not present

## 2015-10-03 DIAGNOSIS — N184 Chronic kidney disease, stage 4 (severe): Secondary | ICD-10-CM | POA: Insufficient documentation

## 2015-10-03 DIAGNOSIS — Z853 Personal history of malignant neoplasm of breast: Secondary | ICD-10-CM | POA: Diagnosis not present

## 2015-10-03 DIAGNOSIS — I2510 Atherosclerotic heart disease of native coronary artery without angina pectoris: Secondary | ICD-10-CM | POA: Diagnosis not present

## 2015-10-03 LAB — TYPE AND SCREEN
ABO/RH(D): O POS
ANTIBODY SCREEN: NEGATIVE
UNIT DIVISION: 0

## 2015-10-03 NOTE — Assessment & Plan Note (Signed)
The stage of her disease is unknown. Recommend generic counseling given her history of uterine cancer and breast cancer and she agreed to proceed.

## 2015-10-03 NOTE — Assessment & Plan Note (Signed)
She has chronic kidney disease stage IV which I think is to main culprit that cause severe anemia. Continue current medication and risk factor modifications. She may benefit from nephrology consult in the outpatient setting.

## 2015-10-03 NOTE — Progress Notes (Signed)
[facility] NOTE  Patient Care Team: Jinny Sanders, MD as PCP - General (Family Medicine)  CHIEF COMPLAINTS/PURPOSE OF CONSULTATION:  Severe anemia, background history of breast cancer and uterine cancer  HISTORY OF PRESENTING ILLNESS:  [name] 82 y.o. female is here because of severe anemia. The patient have chronic anemia on and off for the past few years. She has undergone extensive evaluation in the past. Early 2015, she was found to have severe anemia with hemoglobin of 6.8 and underwent extensive evaluation and was found to have iron deficiency. She was placed on oral iron supplement which she takes 2 a day for a while. She also received blood transfusion in the past. She did not undergo repeat GI evaluation last year. Her last colonoscopy was in 2009. Recently, she was hospitalized for pneumonia between 09/15/2015 to 09/19/2015. She had acute mental status change and acute renal failure which improved with conservative management She had a low hemoglobin of 6.4 and received blood transfusion in the hospital. She received antibiotic for pneumonia. At present time, she complained of fatigue. She denies recent chest pain on exertion, shortness of breath on minimal exertion, pre-syncopal episodes, or palpitations. She had not noticed any recent bleeding such as epistaxis, hematuria or hematochezia The patient denies over the counter NSAID ingestion. She is on antiplatelets agents. Her last colonoscopy was in 2009. She does not recall having EGD She denies any pica and eats a variety of diet. She never donated blood She also have interesting history of right breast cancer, discovered when she was 4 and premenopausal. From her collection, she may have stage II disease due to regional lymph nodes involvement. She had mastectomy and complete lymph node dissection on the right axilla. She recalled receiving some form of chemotherapy but never received  tamoxifen or radiation. In the 90s, she was diagnosed with uterine cancer due to abnormal postmenopausal bleeding. She had complete hysterectomy but never received adjuvant treatment.  MEDICAL HISTORY:  Past Medical History  Diagnosis Date  . Cellulitis     R ARM  . Coronary artery disease   . Hypothyroidism   . Hyperlipidemia   . Hypertension   . Breast cancer ([place])   . Uterine cancer ([place])   . History of breast cancer 10/02/2015  . History of uterine cancer 10/02/2015    SURGICAL HISTORY: Past Surgical History  Procedure Laterality Date  . Total abdominal hysterectomy  1993  . Mastectomy  1979    right, followed by 2 years chemo  . Total hip arthroplasty  08/2009    right  . Appendectomy    . US echocardiography  05/2009    EF 55-60%,MILD-MOD LVH,, MILD-MOD A STENOSIS,  . Nuclear stress test  07/2009    EF 69%,  MILD ISCHEMIA IN INFEROLATERAL WALL  . Coronary artery bypass graft  2008  . Cardiac catheterization      SOCIAL HISTORY: Social History   Social History  . Marital Status: Divorced    Spouse Name: N/A  . Number of Children: N/A  . Years of Education: N/A   Occupational History  . Not on file.   Social History Main Topics  . Smoking status: Never Smoker   . Smokeless tobacco: Never Used  . Alcohol Use: No  . Drug Use: No  . Sexual Activity: Not on file     Comment: 5 children, 4 daughters and 1 son, retired, active in church   Other Topics Concern  . Not  on file   Social History Narrative    FAMILY HISTORY: Family History  Problem Relation Age of Onset  . Cancer Mother     ? stomach cancer  . Diabetes Mother   . Heart disease Father 26  . Diabetes Brother   . Throat cancer Brother     ALLERGIES:  is allergic to levofloxacin and morphine and related.  MEDICATIONS:  Current Outpatient Prescriptions  Medication Sig Dispense Refill  . aspirin 81 MG tablet Take 81 mg by mouth daily.      Marland Kitchen atorvastatin (LIPITOR) 80 MG tablet Take 1 tablet  (80 mg total) by mouth daily. 30 tablet 1  . cyanocobalamin 1000 MCG tablet Take 1,000 mcg by mouth daily. Reported on 09/11/2015    . Ferrous Sulfate (IRON) 325 (65 FE) MG TABS Take 1 tablet by mouth 2 (two) times daily. Reported on 09/11/2015    . gabapentin (NEURONTIN) 100 MG capsule Take 100 mg by mouth 3 (three) times daily. Reported on 09/11/2015    . glipiZIDE (GLUCOTROL XL) 10 MG 24 hr tablet TAKE 1 TABLET BY MOUTH EVERY DAY 30 tablet 5  . levothyroxine (SYNTHROID, LEVOTHROID) 50 MCG tablet TAKE 1 TABLET BY MOUTH EVERY DAY 30 tablet 10  . losartan (COZAAR) 50 MG tablet Take 1 tablet (50 mg total) by mouth daily. 90 tablet 1  . meclizine (ANTIVERT) 25 MG tablet Take 1 tablet (25 mg total) by mouth 3 (three) times daily as needed for dizziness. 30 tablet 0  . metoprolol succinate (TOPROL-XL) 25 MG 24 hr tablet Take 1 tablet (25 mg total) by mouth 2 (two) times daily. 180 tablet 1  . nitroGLYCERIN (NITROSTAT) 0.4 MG SL tablet Place 1 tablet (0.4 mg total) under the tongue every 5 (five) minutes as needed for chest pain (x 3 tabs daily). 25 tablet 3  . ondansetron (ZOFRAN ODT) 4 MG disintegrating tablet Take 1 tablet (4 mg total) by mouth every 8 (eight) hours as needed for nausea or vomiting. 30 tablet 0  . pioglitazone (ACTOS) 30 MG tablet Take 30 mg by mouth daily. Reported on 09/11/2015    . senna-docusate (SENOKOT-S) 8.6-50 MG tablet Take 1 tablet by mouth at bedtime as needed for mild constipation.    . sertraline (ZOLOFT) 25 MG tablet TAKE 1 TABLET (25 MG TOTAL) BY MOUTH DAILY. 30 tablet 5  . triamterene-hydrochlorothiazide (DYAZIDE) 37.5-25 MG capsule Take 1 capsule by mouth daily.  0   No current facility-administered medications for this visit.    REVIEW OF SYSTEMS:   Constitutional: Denies fevers, chills or abnormal night sweats Eyes: Denies blurriness of vision, double vision or watery eyes Ears, nose, mouth, throat, and face: Denies mucositis or sore throat Respiratory: Denies  cough, dyspnea or wheezes Cardiovascular: Denies palpitation, chest discomfort or lower extremity swelling Gastrointestinal:  Denies nausea, heartburn or change in bowel habits Skin: Denies abnormal skin rashes Lymphatics: Denies new lymphadenopathy or easy bruising Neurological:Denies numbness, tingling or new weaknesses Behavioral/Psych: Mood is stable, no new changes  All other systems were reviewed with the patient and are negative.  PHYSICAL EXAMINATION: ECOG PERFORMANCE STATUS: 1 - Symptomatic but completely ambulatory  Filed Vitals:   10/02/15 1210  BP: 144/39  Pulse: 66  Temp: 97.5 F (36.4 C)  Resp: 18   Filed Weights   10/02/15 1210  Weight: 184 lb 1.6 oz (83.507 kg)    GENERAL:alert, no distress and comfortable. She is moderately obese and appears very pale SKIN: skin color is pale, texture,  turgor are normal, no rashes or significant lesions EYES: normal, conjunctiva are pale and non-injected, sclera clear OROPHARYNX:no exudate, no erythema and lips, buccal mucosa, and tongue normal  NECK: supple, thyroid normal size, non-tender, without nodularity LYMPH:  no palpable lymphadenopathy in the cervical, axillary or inguinal LUNGS: clear to auscultation and percussion with normal breathing effort HEART: regular rate & rhythm and no murmurs and no lower extremity edema ABDOMEN:abdomen soft, non-tender and normal bowel sounds Musculoskeletal:no cyanosis of digits and no clubbing  PSYCH: alert & oriented x 3 with fluent speech NEURO: no focal motor/sensory deficits The mastectomy scar on the right chest wall. Normal breast exam on the left LABORATORY DATA:  I have reviewed the data as listed Lab Results  Component Value Date   WBC 8.7 10/02/2015   HGB 7.8* 10/02/2015   HCT 25.7* 10/02/2015   MCV 87.4 10/02/2015   PLT 195 10/02/2015     RADIOGRAPHIC STUDIES: I have personally reviewed the radiological images as listed and agreed with the findings in the  report. Dg Chest 2 View  09/05/2015  CLINICAL DATA:  Shortness of breath for 1 week EXAM: CHEST  2 VIEW COMPARISON:  October 11, 2014 FINDINGS: There is consolidation in the lateral and posterior left base regions with small left effusion. Lungs elsewhere clear. Heart is mildly enlarged with pulmonary vascularity within normal limits. Patient is status post coronary artery bypass grafting. No adenopathy. There is a hiatal hernia present. There is atherosclerotic change throughout the aorta. No bone lesions. IMPRESSION: Infiltrate left base with small left effusion. Moderate hiatal hernia, stable. Stable cardiomegaly. Followup PA and lateral chest radiographs recommended in 3-4 weeks following trial of antibiotic therapy to ensure resolution and exclude underlying malignancy. Electronically Signed   By: Lowella Grip III M.D.   On: 09/05/2015 13:41   Ct Head Wo Contrast  09/15/2015  CLINICAL DATA:  82 year old hypertensive female recently discharged from hospital for treatment of pneumonia. Presenting with aphasia and confusion. Initial encounter. EXAM: CT HEAD WITHOUT CONTRAST TECHNIQUE: Contiguous axial images were obtained from the base of the skull through the vertex without intravenous contrast. COMPARISON:  05/02/2008. FINDINGS: Slightly dense appearance of the basilar artery/basilar tip which appears different than the prior CT and in the proper clinical setting, thrombosis could not be excluded. Alternatively, this may be related to atherosclerotic changes. Otherwise no CT evidence of large acute infarct. Moderate small vessel disease type changes. No intracranial hemorrhage. Global atrophy without hydrocephalus. No intracranial mass lesion noted on this unenhanced exam. Vascular calcifications. Mastoid air cells, middle ear cavities and visualized paranasal sinuses are clear. Post lens replacement.  Exophthalmos. IMPRESSION: Slightly dense appearance of the basilar artery/basilar tip which appears  different than the prior CT and in the proper clinical setting, thrombosis could not be excluded. Alternatively, this may be related to atherosclerotic changes. Otherwise no CT evidence of large acute infarct. Moderate small vessel disease type changes. No intracranial hemorrhage. Global atrophy without hydrocephalus. Electronically Signed   By: Genia Del M.D.   On: 09/15/2015 13:46   Mr Brain Wo Contrast  09/15/2015  CLINICAL DATA:  82 year old hypertensive female with aphasia and confusion. Subsequent encounter. EXAM: MRI HEAD WITHOUT CONTRAST MRA HEAD WITHOUT CONTRAST TECHNIQUE: Multiplanar, multiecho pulse sequences of the brain and surrounding structures were obtained without intravenous contrast. Angiographic images of the head were obtained using MRA technique without contrast. COMPARISON:  09/15/2015 CT. FINDINGS: MRI HEAD FINDINGS Exam is motion degraded. No acute infarct or intracranial hemorrhage. Moderate small  vessel disease type changes. Global atrophy without hydrocephalus. No intracranial mass lesion noted on this unenhanced exam. Major intracranial vascular structures are patent. Post lens replacement.  Exophthalmos. Partially empty sella incidentally noted. Cervical medullary junction and pineal region unremarkable. MRA HEAD FINDINGS Exam is motion degraded. Mild to moderate narrowing at the junction of the left internal carotid artery cavernous and supraclinoid segment Mild narrowing right internal carotid artery cavernous and supraclinoid segment. Moderate focal narrowing mid aspect A1 segment right anterior cerebral artery. Mild to moderate narrowing mid to distal M1 segment right middle cerebral artery. Middle cerebral artery branch vessel moderate narrowing and irregularity bilaterally. Mild to slightly moderate narrowing distal right vertebral artery. Mild narrowing distal left vertebral artery. Nonvisualized right posterior inferior cerebellar artery and left anterior inferior  cerebellar artery. No significant stenosis of the basilar artery. Moderate narrowing mid aspect right posterior cerebral artery P2 segment. Mild narrowing distal branches posterior cerebral artery bilaterally. Mild narrowing proximal left posterior cerebral artery. No aneurysm noted. IMPRESSION: MRI HEAD Exam is motion degraded. No acute infarct. Moderate small vessel disease type changes. Global atrophy without hydrocephalus. MRA HEAD FINDINGS Exam is motion degraded. No significant stenosis of the basilar artery. Scattered intracranial atherosclerotic type changes as detailed above. Electronically Signed   By: Genia Del M.D.   On: 09/15/2015 19:19   Dg Chest Portable 1 View  09/15/2015  CLINICAL DATA:  Altered mental status, recent pneumonia EXAM: PORTABLE CHEST 1 VIEW COMPARISON:  09/05/2015 FINDINGS: Cardiomegaly again noted. Status post CABG. There is small left pleural effusion with left basilar atelectasis or infiltrate. No pulmonary edema. IMPRESSION: Small left pleural effusion left basilar atelectasis or infiltrate. No pulmonary edema. Electronically Signed   By: Lahoma Crocker M.D.   On: 09/15/2015 13:29   Dg Abd Portable 1v  09/18/2015  CLINICAL DATA:  Nausea this morning.  No pain. EXAM: PORTABLE ABDOMEN - 1 VIEW COMPARISON:  Chest x-ray 09/05/2015 FINDINGS: Stable opacification of the left base. Bowel gas pattern is nonobstructive. Multiple surgical clips over the lower abdomen and pelvis. Calcified plaque over the abdominal aorta and right iliac artery. Right total hip arthroplasty. Mild degenerative change of the left hip and moderate degenerative change of the spine. IMPRESSION: Nonobstructive bowel gas pattern. Electronically Signed   By: Marin Olp M.D.   On: 09/18/2015 10:12   Mr Mra Head/brain Wo Cm  09/15/2015  CLINICAL DATA:  82 year old hypertensive female with aphasia and confusion. Subsequent encounter. EXAM: MRI HEAD WITHOUT CONTRAST MRA HEAD WITHOUT CONTRAST TECHNIQUE:  Multiplanar, multiecho pulse sequences of the brain and surrounding structures were obtained without intravenous contrast. Angiographic images of the head were obtained using MRA technique without contrast. COMPARISON:  09/15/2015 CT. FINDINGS: MRI HEAD FINDINGS Exam is motion degraded. No acute infarct or intracranial hemorrhage. Moderate small vessel disease type changes. Global atrophy without hydrocephalus. No intracranial mass lesion noted on this unenhanced exam. Major intracranial vascular structures are patent. Post lens replacement.  Exophthalmos. Partially empty sella incidentally noted. Cervical medullary junction and pineal region unremarkable. MRA HEAD FINDINGS Exam is motion degraded. Mild to moderate narrowing at the junction of the left internal carotid artery cavernous and supraclinoid segment Mild narrowing right internal carotid artery cavernous and supraclinoid segment. Moderate focal narrowing mid aspect A1 segment right anterior cerebral artery. Mild to moderate narrowing mid to distal M1 segment right middle cerebral artery. Middle cerebral artery branch vessel moderate narrowing and irregularity bilaterally. Mild to slightly moderate narrowing distal right vertebral artery. Mild narrowing distal left  vertebral artery. Nonvisualized right posterior inferior cerebellar artery and left anterior inferior cerebellar artery. No significant stenosis of the basilar artery. Moderate narrowing mid aspect right posterior cerebral artery P2 segment. Mild narrowing distal branches posterior cerebral artery bilaterally. Mild narrowing proximal left posterior cerebral artery. No aneurysm noted. IMPRESSION: MRI HEAD Exam is motion degraded. No acute infarct. Moderate small vessel disease type changes. Global atrophy without hydrocephalus. MRA HEAD FINDINGS Exam is motion degraded. No significant stenosis of the basilar artery. Scattered intracranial atherosclerotic type changes as detailed above.  Electronically Signed   By: Genia Del M.D.   On: 09/15/2015 19:19    ASSESSMENT & PLAN:  Anemia in chronic kidney disease The cause of her anemia is multifactorial, likely anemia of chronic disease with maybe a component of iron deficiency. Her stat hemoglobin in my office is very low and I recommend blood transfusion. We discussed some of the risks, benefits, and alternatives of blood transfusions. The patient is symptomatic from anemia and the hemoglobin level is critically low.  Some of the side-effects to be expected including risks of transfusion reactions, chills, infection, syndrome of volume overload and risk of hospitalization from various reasons and the patient is willing to proceed and went ahead to sign consent today. I will proceed with 1 unit of blood and order additional workup and she agreed with the plan of care. I recommend she continues an oral iron supplement once a day at nighttime  History of uterine cancer The stage of her disease is unknown. Recommend generic counseling given her history of uterine cancer and breast cancer and she agreed to proceed.  History of breast cancer Recent mammogram is negative. Examination is benign. Given her young age of diagnosis and uterine cancer, I recommend genetic counseling and she agreed to proceed.  Chronic kidney disease, stage 4, severely decreased GFR (HCC) She has chronic kidney disease stage IV which I think is to main culprit that cause severe anemia. Continue current medication and risk factor modifications. She may benefit from nephrology consult in the outpatient setting.    All questions were answered. The patient knows to call the clinic with any problems, questions or concerns. I spent 40 minutes counseling the patient face to face. The total time spent in the appointment was 60 minutes and more than 50% was on counseling.     [facility], [place], MD 10/03/2015 3:48 PM

## 2015-10-03 NOTE — Assessment & Plan Note (Signed)
Recent mammogram is negative. Examination is benign. Given her young age of diagnosis and uterine cancer, I recommend genetic counseling and she agreed to proceed.

## 2015-10-03 NOTE — Assessment & Plan Note (Signed)
The cause of her anemia is multifactorial, likely anemia of chronic disease with maybe a component of iron deficiency. Her stat hemoglobin in my office is very low and I recommend blood transfusion. We discussed some of the risks, benefits, and alternatives of blood transfusions. The patient is symptomatic from anemia and the hemoglobin level is critically low.  Some of the side-effects to be expected including risks of transfusion reactions, chills, infection, syndrome of volume overload and risk of hospitalization from various reasons and the patient is willing to proceed and went ahead to sign consent today. I will proceed with 1 unit of blood and order additional workup and she agreed with the plan of care. I recommend she continues an oral iron supplement once a day at nighttime

## 2015-10-06 DIAGNOSIS — J189 Pneumonia, unspecified organism: Secondary | ICD-10-CM | POA: Diagnosis not present

## 2015-10-06 DIAGNOSIS — I10 Essential (primary) hypertension: Secondary | ICD-10-CM | POA: Diagnosis not present

## 2015-10-06 DIAGNOSIS — I2510 Atherosclerotic heart disease of native coronary artery without angina pectoris: Secondary | ICD-10-CM | POA: Diagnosis not present

## 2015-10-06 DIAGNOSIS — Z853 Personal history of malignant neoplasm of breast: Secondary | ICD-10-CM | POA: Diagnosis not present

## 2015-10-06 DIAGNOSIS — E1149 Type 2 diabetes mellitus with other diabetic neurological complication: Secondary | ICD-10-CM | POA: Diagnosis not present

## 2015-10-06 LAB — ERYTHROPOIETIN: ERYTHROPOIETIN: 42.9 m[IU]/mL — AB (ref 2.6–18.5)

## 2015-10-06 LAB — SEDIMENTATION RATE: Sed Rate: 69 mm/hr — ABNORMAL HIGH (ref 0–30)

## 2015-10-08 ENCOUNTER — Other Ambulatory Visit (HOSPITAL_COMMUNITY): Payer: Self-pay

## 2015-10-09 ENCOUNTER — Ambulatory Visit (HOSPITAL_BASED_OUTPATIENT_CLINIC_OR_DEPARTMENT_OTHER): Payer: Medicare Other | Admitting: Hematology and Oncology

## 2015-10-09 ENCOUNTER — Ambulatory Visit (HOSPITAL_BASED_OUTPATIENT_CLINIC_OR_DEPARTMENT_OTHER): Payer: Medicare Other

## 2015-10-09 VITALS — BP 145/47 | HR 70 | Temp 98.4°F | Resp 18 | Ht 62.5 in | Wt 184.2 lb

## 2015-10-09 DIAGNOSIS — G609 Hereditary and idiopathic neuropathy, unspecified: Secondary | ICD-10-CM | POA: Diagnosis not present

## 2015-10-09 DIAGNOSIS — Z853 Personal history of malignant neoplasm of breast: Secondary | ICD-10-CM | POA: Diagnosis not present

## 2015-10-09 DIAGNOSIS — Z8542 Personal history of malignant neoplasm of other parts of uterus: Secondary | ICD-10-CM

## 2015-10-09 DIAGNOSIS — D509 Iron deficiency anemia, unspecified: Secondary | ICD-10-CM

## 2015-10-09 DIAGNOSIS — D631 Anemia in chronic kidney disease: Secondary | ICD-10-CM

## 2015-10-09 DIAGNOSIS — N184 Chronic kidney disease, stage 4 (severe): Secondary | ICD-10-CM

## 2015-10-09 DIAGNOSIS — E1149 Type 2 diabetes mellitus with other diabetic neurological complication: Secondary | ICD-10-CM

## 2015-10-09 DIAGNOSIS — J189 Pneumonia, unspecified organism: Secondary | ICD-10-CM | POA: Diagnosis not present

## 2015-10-09 DIAGNOSIS — I2510 Atherosclerotic heart disease of native coronary artery without angina pectoris: Secondary | ICD-10-CM

## 2015-10-09 DIAGNOSIS — N189 Chronic kidney disease, unspecified: Secondary | ICD-10-CM

## 2015-10-09 LAB — CBC WITH DIFFERENTIAL/PLATELET
BASO%: 1.2 % (ref 0.0–2.0)
Basophils Absolute: 0.1 10*3/uL (ref 0.0–0.1)
EOS ABS: 0.5 10*3/uL (ref 0.0–0.5)
EOS%: 8.4 % — ABNORMAL HIGH (ref 0.0–7.0)
HCT: 26.9 % — ABNORMAL LOW (ref 34.8–46.6)
HEMOGLOBIN: 8.7 g/dL — AB (ref 11.6–15.9)
LYMPH#: 1 10*3/uL (ref 0.9–3.3)
LYMPH%: 15.8 % (ref 14.0–49.7)
MCH: 28.2 pg (ref 25.1–34.0)
MCHC: 32.2 g/dL (ref 31.5–36.0)
MCV: 87.6 fL (ref 79.5–101.0)
MONO#: 0.3 10*3/uL (ref 0.1–0.9)
MONO%: 5.1 % (ref 0.0–14.0)
NEUT#: 4.2 10*3/uL (ref 1.5–6.5)
NEUT%: 69.5 % (ref 38.4–76.8)
Platelets: 164 10*3/uL (ref 145–400)
RBC: 3.07 10*6/uL — AB (ref 3.70–5.45)
RDW: 23.7 % — ABNORMAL HIGH (ref 11.2–14.5)
WBC: 6.1 10*3/uL (ref 3.9–10.3)

## 2015-10-09 MED ORDER — DARBEPOETIN ALFA 200 MCG/0.4ML IJ SOSY
200.0000 ug | PREFILLED_SYRINGE | Freq: Once | INTRAMUSCULAR | Status: AC
  Administered 2015-10-09: 200 ug via SUBCUTANEOUS
  Filled 2015-10-09: qty 0.4

## 2015-10-09 NOTE — Patient Instructions (Signed)
Darbepoetin Alfa injection What is this medicine? DARBEPOETIN ALFA (dar be POE e tin AL fa) helps your body make more red blood cells. It is used to treat anemia caused by chronic kidney failure and chemotherapy. This medicine may be used for other purposes; ask your health care provider or pharmacist if you have questions. What should I tell my health care provider before I take this medicine? They need to know if you have any of these conditions: -blood clotting disorders or history of blood clots -cancer patient not on chemotherapy -cystic fibrosis -heart disease, such as angina, heart failure, or a history of a heart attack -hemoglobin level of 12 g/dL or greater -high blood pressure -low levels of folate, iron, or vitamin B12 -seizures -an unusual or allergic reaction to darbepoetin, erythropoietin, albumin, hamster proteins, latex, other medicines, foods, dyes, or preservatives -pregnant or trying to get pregnant -breast-feeding How should I use this medicine? This medicine is for injection into a vein or under the skin. It is usually given by a health care professional in a hospital or clinic setting. If you get this medicine at home, you will be taught how to prepare and give this medicine. Do not shake the solution before you withdraw a dose. Use exactly as directed. Take your medicine at regular intervals. Do not take your medicine more often than directed. It is important that you put your used needles and syringes in a special sharps container. Do not put them in a trash can. If you do not have a sharps container, call your pharmacist or healthcare provider to get one. Talk to your pediatrician regarding the use of this medicine in children. While this medicine may be used in children as young as 1 year for selected conditions, precautions do apply. Overdosage: If you think you have taken too much of this medicine contact a poison control center or emergency room at once. NOTE:  This medicine is only for you. Do not share this medicine with others. What if I miss a dose? If you miss a dose, take it as soon as you can. If it is almost time for your next dose, take only that dose. Do not take double or extra doses. What may interact with this medicine? Do not take this medicine with any of the following medications: -epoetin alfa This list may not describe all possible interactions. Give your health care provider a list of all the medicines, herbs, non-prescription drugs, or dietary supplements you use. Also tell them if you smoke, drink alcohol, or use illegal drugs. Some items may interact with your medicine. What should I watch for while using this medicine? Visit your prescriber or health care professional for regular checks on your progress and for the needed blood tests and blood pressure measurements. It is especially important for the doctor to make sure your hemoglobin level is in the desired range, to limit the risk of potential side effects and to give you the best benefit. Keep all appointments for any recommended tests. Check your blood pressure as directed. Ask your doctor what your blood pressure should be and when you should contact him or her. As your body makes more red blood cells, you may need to take iron, folic acid, or vitamin B supplements. Ask your doctor or health care provider which products are right for you. If you have kidney disease continue dietary restrictions, even though this medication can make you feel better. Talk with your doctor or health care professional about the   foods you eat and the vitamins that you take. What side effects may I notice from receiving this medicine? Side effects that you should report to your doctor or health care professional as soon as possible: -allergic reactions like skin rash, itching or hives, swelling of the face, lips, or tongue -breathing problems -changes in vision -chest pain -confusion, trouble speaking  or understanding -feeling faint or lightheaded, falls -high blood pressure -muscle aches or pains -pain, swelling, warmth in the leg -rapid weight gain -severe headaches -sudden numbness or weakness of the face, arm or leg -trouble walking, dizziness, loss of balance or coordination -seizures (convulsions) -swelling of the ankles, feet, hands -unusually weak or tired Side effects that usually do not require medical attention (report to your doctor or health care professional if they continue or are bothersome): -diarrhea -fever, chills (flu-like symptoms) -headaches -nausea, vomiting -redness, stinging, or swelling at site where injected This list may not describe all possible side effects. Call your doctor for medical advice about side effects. You may report side effects to FDA at 1-800-FDA-1088. Where should I keep my medicine? Keep out of the reach of children. Store in a refrigerator between 2 and 8 degrees C (36 and 46 degrees F). Do not freeze. Do not shake. Throw away any unused portion if using a single-dose vial. Throw away any unused medicine after the expiration date. NOTE: This sheet is a summary. It may not cover all possible information. If you have questions about this medicine, talk to your doctor, pharmacist, or health care provider.    2016, Elsevier/[employer] Standard. (2008-08-27 10:23:57)  

## 2015-10-10 ENCOUNTER — Telehealth: Payer: Self-pay | Admitting: Hematology and Oncology

## 2015-10-10 ENCOUNTER — Encounter: Payer: Self-pay | Admitting: Hematology and Oncology

## 2015-10-10 DIAGNOSIS — Z853 Personal history of malignant neoplasm of breast: Secondary | ICD-10-CM | POA: Diagnosis not present

## 2015-10-10 DIAGNOSIS — I2510 Atherosclerotic heart disease of native coronary artery without angina pectoris: Secondary | ICD-10-CM | POA: Diagnosis not present

## 2015-10-10 DIAGNOSIS — I10 Essential (primary) hypertension: Secondary | ICD-10-CM | POA: Diagnosis not present

## 2015-10-10 DIAGNOSIS — J189 Pneumonia, unspecified organism: Secondary | ICD-10-CM | POA: Diagnosis not present

## 2015-10-10 DIAGNOSIS — E1149 Type 2 diabetes mellitus with other diabetic neurological complication: Secondary | ICD-10-CM | POA: Diagnosis not present

## 2015-10-10 NOTE — Assessment & Plan Note (Signed)
The stage of her disease is unknown. Recommend generic counseling given her history of uterine cancer and breast cancer and she agreed to proceed.

## 2015-10-10 NOTE — Assessment & Plan Note (Signed)
This is related to chronic hypertensive renal disease and other reasons. I recommend nephrology consultation and she agreed to proceed.

## 2015-10-10 NOTE — Progress Notes (Signed)
[facility] OFFICE PROGRESS NOTE  Patient Care Team: Jinny Sanders, MD as PCP - General (Family Medicine)  SUMMARY OF ONCOLOGIC HISTORY:  [name] 82 y.o. female is here because of severe anemia. The patient have chronic anemia on and off for the past few years. She has undergone extensive evaluation in the past. Early 2015, she was found to have severe anemia with hemoglobin of 6.8 and underwent extensive evaluation and was found to have iron deficiency. She was placed on oral iron supplement which she takes 2 a day for a while. She also received blood transfusion in the past. She did not undergo repeat GI evaluation last year. Her last colonoscopy was in 2009. Recently, she was hospitalized for pneumonia between 09/15/2015 to 09/19/2015. She had acute mental status change and acute renal failure which improved with conservative management She had a low hemoglobin of 6.4 and received blood transfusion in the hospital. She received antibiotic for pneumonia. At present time, she complained of fatigue. She denies recent chest pain on exertion, shortness of breath on minimal exertion, pre-syncopal episodes, or palpitations. She had not noticed any recent bleeding such as epistaxis, hematuria or hematochezia The patient denies over the counter NSAID ingestion. She is on antiplatelets agents. Her last colonoscopy was in 2009. She does not recall having EGD She denies any pica and eats a variety of diet. She never donated blood She also have interesting history of right breast cancer, discovered when she was 15 and premenopausal. From her collection, she may have stage II disease due to regional lymph nodes involvement. She had mastectomy and complete lymph node dissection on the right axilla. She recalled receiving some form of chemotherapy but never received tamoxifen or radiation. In the 90s, she was diagnosed with uterine cancer due to abnormal postmenopausal  bleeding. She had complete hysterectomy but never received adjuvant treatment. On 10/02/2015, she received blood transfusion for severe anemia On 10/09/2015, she started RN as injection  INTERVAL HISTORY: Please see below for problem oriented charting.  She felt slightly better after she received blood transfusion. She continues to complain of fatigue.  REVIEW OF SYSTEMS:   Constitutional: Denies fevers, chills or abnormal weight loss Eyes: Denies blurriness of vision Ears, nose, mouth, throat, and face: Denies mucositis or sore throat Respiratory: Denies cough, dyspnea or wheezes Cardiovascular: Denies palpitation, chest discomfort or lower extremity swelling Gastrointestinal:  Denies nausea, heartburn or change in bowel habits Skin: Denies abnormal skin rashes Lymphatics: Denies new lymphadenopathy or easy bruising Neurological:Denies numbness, tingling or new weaknesses Behavioral/Psych: Mood is stable, no new changes  All other systems were reviewed with the patient and are negative.  I have reviewed the past medical history, past surgical history, social history and family history with the patient and they are unchanged from previous note.  ALLERGIES:  is allergic to levofloxacin and morphine and related.  MEDICATIONS:  Current Outpatient Prescriptions  Medication Sig Dispense Refill  . aspirin 81 MG tablet Take 81 mg by mouth daily.      Marland Kitchen atorvastatin (LIPITOR) 80 MG tablet Take 1 tablet (80 mg total) by mouth daily. 30 tablet 1  . cyanocobalamin 1000 MCG tablet Take 1,000 mcg by mouth daily. Reported on 09/11/2015    . Ferrous Sulfate (IRON) 325 (65 FE) MG TABS Take 1 tablet by mouth 2 (two) times daily. Reported on 09/11/2015    . gabapentin (NEURONTIN) 100 MG capsule Take 100 mg by mouth 3 (three) times daily. Reported on 09/11/2015    .  glipiZIDE (GLUCOTROL XL) 10 MG 24 hr tablet TAKE 1 TABLET BY MOUTH EVERY DAY 30 tablet 5  . levothyroxine (SYNTHROID, LEVOTHROID) 50  MCG tablet TAKE 1 TABLET BY MOUTH EVERY DAY 30 tablet 10  . losartan (COZAAR) 50 MG tablet Take 1 tablet (50 mg total) by mouth daily. 90 tablet 1  . meclizine (ANTIVERT) 25 MG tablet Take 1 tablet (25 mg total) by mouth 3 (three) times daily as needed for dizziness. 30 tablet 0  . metoprolol succinate (TOPROL-XL) 25 MG 24 hr tablet Take 1 tablet (25 mg total) by mouth 2 (two) times daily. 180 tablet 1  . nitroGLYCERIN (NITROSTAT) 0.4 MG SL tablet Place 1 tablet (0.4 mg total) under the tongue every 5 (five) minutes as needed for chest pain (x 3 tabs daily). 25 tablet 3  . ondansetron (ZOFRAN ODT) 4 MG disintegrating tablet Take 1 tablet (4 mg total) by mouth every 8 (eight) hours as needed for nausea or vomiting. 30 tablet 0  . pioglitazone (ACTOS) 30 MG tablet Take 30 mg by mouth daily. Reported on 09/11/2015    . senna-docusate (SENOKOT-S) 8.6-50 MG tablet Take 1 tablet by mouth at bedtime as needed for mild constipation.    . sertraline (ZOLOFT) 25 MG tablet TAKE 1 TABLET (25 MG TOTAL) BY MOUTH DAILY. 30 tablet 5  . triamterene-hydrochlorothiazide (DYAZIDE) 37.5-25 MG capsule Take 1 capsule by mouth daily.  0   No current facility-administered medications for this visit.    PHYSICAL EXAMINATION: ECOG PERFORMANCE STATUS: 1 - Symptomatic but completely ambulatory  Filed Vitals:   10/09/15 1321  BP: 145/47  Pulse: 70  Temp: 98.4 F (36.9 C)  Resp: 18   Filed Weights   10/09/15 1321  Weight: 184 lb 3.2 oz (83.553 kg)    GENERAL:alert, no distress and comfortable SKIN: skin color is pale, texture, turgor are normal, no rashes or significant lesions EYES: normal, Conjunctiva are pale and non-injected, sclera clear OROPHARYNX:no exudate, no erythema and lips, buccal mucosa, and tongue normal  Musculoskeletal:no cyanosis of digits and no clubbing  NEURO: alert & oriented x 3 with fluent speech, no focal motor/sensory deficits  LABORATORY DATA:  I have reviewed the data as listed     Component Value Date/Time   NA 142 10/02/2015 1432   NA 137 09/19/2015 1010   K 4.8 10/02/2015 1432   K 4.2 09/19/2015 1010   CL 109 09/19/2015 1010   CO2 23 10/02/2015 1432   CO2 22 09/19/2015 1010   GLUCOSE 204* 10/02/2015 1432   GLUCOSE 162* 09/19/2015 1010   BUN 40.7* 10/02/2015 1432   BUN 30* 09/19/2015 1010   CREATININE 1.9* 10/02/2015 1432   CREATININE 1.27* 09/19/2015 1010   CALCIUM 9.0 10/02/2015 1432   CALCIUM 8.6* 09/19/2015 1010   PROT 6.1* 10/02/2015 1432   PROT 6.1* 09/17/2015 0334   ALBUMIN 3.1* 10/02/2015 1432   ALBUMIN 2.6* 09/17/2015 0334   AST 13 10/02/2015 1432   AST 15 09/17/2015 0334   ALT 11 10/02/2015 1432   ALT 11* 09/17/2015 0334   ALKPHOS 76 10/02/2015 1432   ALKPHOS 79 09/17/2015 0334   BILITOT 0.41 10/02/2015 1432   BILITOT 0.6 09/17/2015 0334   GFRNONAA 39* 09/19/2015 1010   GFRAA 45* 09/19/2015 1010    No results found for: SPEP, UPEP  Lab Results  Component Value Date   WBC 6.1 10/09/2015   NEUTROABS 4.2 10/09/2015   HGB 8.7* 10/09/2015   HCT 26.9* 10/09/2015   MCV 87.6  10/09/2015   PLT 164 10/09/2015      Chemistry      Component Value Date/Time   NA 142 10/02/2015 1432   NA 137 09/19/2015 1010   K 4.8 10/02/2015 1432   K 4.2 09/19/2015 1010   CL 109 09/19/2015 1010   CO2 23 10/02/2015 1432   CO2 22 09/19/2015 1010   BUN 40.7* 10/02/2015 1432   BUN 30* 09/19/2015 1010   CREATININE 1.9* 10/02/2015 1432   CREATININE 1.27* 09/19/2015 1010      Component Value Date/Time   CALCIUM 9.0 10/02/2015 1432   CALCIUM 8.6* 09/19/2015 1010   ALKPHOS 76 10/02/2015 1432   ALKPHOS 79 09/17/2015 0334   AST 13 10/02/2015 1432   AST 15 09/17/2015 0334   ALT 11 10/02/2015 1432   ALT 11* 09/17/2015 0334   BILITOT 0.41 10/02/2015 1432   BILITOT 0.6 09/17/2015 0334     ASSESSMENT & PLAN:  Anemia in chronic kidney disease  The most likely cause of her anemia is anemia of chronic disease and mild iron deficiency anemia. I reinforced  the importance of oral iron supplement.  she will return here every 2 weeks for blood work monitoring and ESA injection. If her hemoglobin is less than 8, we will give her blood transfusion. We discussed some of the risks, benefits, and alternatives of erythropoietin stimulating agents such as Aranesp. The patient is symptomatic from anemia and the EPO level is low. Some of the side-effects to be expected including risks of allergic reactions, skin rashes, headaches, risk of blood clots including heart attack and stroke. There is rare risks of causing growth of cancers.The patient is willing to proceed and went ahead to sign consent today.  The goal is to keep hemoglobin greater than 11 g.  Chronic kidney disease, stage 4, severely decreased GFR (HCC)  This is related to chronic hypertensive renal disease and other reasons. I recommend nephrology consultation and she agreed to proceed.  History of breast cancer Recent mammogram is negative. Examination is benign. Given her young age of diagnosis and uterine cancer, I recommend genetic counseling and she agreed to proceed.    History of uterine cancer The stage of her disease is unknown. Recommend generic counseling given her history of uterine cancer and breast cancer and she agreed to proceed.     Orders Placed This Encounter  Procedures  . CBC with Differential    Standing Status: Future     Number of Occurrences: 1     Standing Expiration Date: 10/08/2016  . CBC & Diff and Retic    Standing Status: Standing     Number of Occurrences: 33     Standing Expiration Date: 10/08/2016  . Ferritin    Standing Status: Future     Number of Occurrences:      Standing Expiration Date: 10/08/2016  . Ambulatory referral to Nephrology    Referral Priority:  Routine    Referral Type:  Consultation    Referral Reason:  Specialty Services Required    Requested Specialty:  Nephrology    Number of Visits Requested:  1  . Hold Tube, Blood Bank     Standing Status: Future     Number of Occurrences: 1     Standing Expiration Date: 10/08/2016  . Hold Tube, Blood Bank    Standing Status: Standing     Number of Occurrences: 9     Standing Expiration Date: 10/08/2016   All questions were answered. The patient knows  to call the clinic with any problems, questions or concerns. No barriers to learning was detected. I spent 20 minutes counseling the patient face to face. The total time spent in the appointment was 25 minutes and more than 50% was on counseling and review of test results     [facility], [place], MD 10/10/2015 4:24 PM

## 2015-10-10 NOTE — Assessment & Plan Note (Addendum)
The most likely cause of her anemia is anemia of chronic disease and mild iron deficiency anemia. I reinforced the importance of oral iron supplement.  she will return here every 2 weeks for blood work monitoring and ESA injection. If her hemoglobin is less than 8, we will give her blood transfusion. We discussed some of the risks, benefits, and alternatives of erythropoietin stimulating agents such as Aranesp. The patient is symptomatic from anemia and the EPO level is low. Some of the side-effects to be expected including risks of allergic reactions, skin rashes, headaches, risk of blood clots including heart attack and stroke. There is rare risks of causing growth of cancers.The patient is willing to proceed and went ahead to sign consent today.  The goal is to keep hemoglobin greater than 11 g.

## 2015-10-10 NOTE — Assessment & Plan Note (Signed)
Recent mammogram is negative. Examination is benign. Given her young age of diagnosis and uterine cancer, I recommend genetic counseling and she agreed to proceed.

## 2015-10-10 NOTE — Telephone Encounter (Signed)
Faxed pt medical records to [facility]

## 2015-10-11 ENCOUNTER — Other Ambulatory Visit: Payer: Self-pay | Admitting: Family Medicine

## 2015-10-20 DIAGNOSIS — E1149 Type 2 diabetes mellitus with other diabetic neurological complication: Secondary | ICD-10-CM | POA: Diagnosis not present

## 2015-10-20 DIAGNOSIS — Z853 Personal history of malignant neoplasm of breast: Secondary | ICD-10-CM | POA: Diagnosis not present

## 2015-10-20 DIAGNOSIS — I10 Essential (primary) hypertension: Secondary | ICD-10-CM | POA: Diagnosis not present

## 2015-10-20 DIAGNOSIS — J189 Pneumonia, unspecified organism: Secondary | ICD-10-CM | POA: Diagnosis not present

## 2015-10-20 DIAGNOSIS — I2510 Atherosclerotic heart disease of native coronary artery without angina pectoris: Secondary | ICD-10-CM | POA: Diagnosis not present

## 2015-10-21 ENCOUNTER — Other Ambulatory Visit: Payer: Self-pay | Admitting: *Deleted

## 2015-10-21 MED ORDER — GLUCOSE BLOOD VI STRP: ORAL_STRIP | Status: AC

## 2015-10-21 MED ORDER — ACCU-CHEK SOFT TOUCH LANCETS MISC: Status: DC

## 2015-10-21 MED ORDER — ACCU-CHEK SOFTCLIX LANCET DEV MISC: Status: AC

## 2015-10-21 MED ORDER — ACCU-CHEK AVIVA DEVI: Status: DC

## 2015-10-21 NOTE — Telephone Encounter (Signed)
Received fax from [facility] stating insurance will cover Accu-Chek or One Touch.  New Rx sent into pharmacy for Accu-Chek lancets, meter and test strips.

## 2015-10-22 ENCOUNTER — Other Ambulatory Visit: Payer: Self-pay

## 2015-10-22 ENCOUNTER — Encounter: Payer: Self-pay | Admitting: Genetic Counselor

## 2015-10-22 DIAGNOSIS — Z961 Presence of intraocular lens: Secondary | ICD-10-CM | POA: Diagnosis not present

## 2015-10-22 DIAGNOSIS — H52203 Unspecified astigmatism, bilateral: Secondary | ICD-10-CM | POA: Diagnosis not present

## 2015-10-22 DIAGNOSIS — E113212 Type 2 diabetes mellitus with mild nonproliferative diabetic retinopathy with macular edema, left eye: Secondary | ICD-10-CM | POA: Diagnosis not present

## 2015-10-22 LAB — HM DIABETES EYE EXAM

## 2015-10-23 ENCOUNTER — Ambulatory Visit (HOSPITAL_BASED_OUTPATIENT_CLINIC_OR_DEPARTMENT_OTHER): Payer: Medicare Other

## 2015-10-23 ENCOUNTER — Other Ambulatory Visit (HOSPITAL_BASED_OUTPATIENT_CLINIC_OR_DEPARTMENT_OTHER): Payer: Medicare Other

## 2015-10-23 ENCOUNTER — Encounter: Payer: Self-pay | Admitting: Genetic Counselor

## 2015-10-23 VITALS — BP 141/51 | HR 70 | Temp 98.1°F

## 2015-10-23 DIAGNOSIS — N189 Chronic kidney disease, unspecified: Principal | ICD-10-CM

## 2015-10-23 DIAGNOSIS — N184 Chronic kidney disease, stage 4 (severe): Secondary | ICD-10-CM | POA: Diagnosis not present

## 2015-10-23 DIAGNOSIS — D631 Anemia in chronic kidney disease: Secondary | ICD-10-CM

## 2015-10-23 DIAGNOSIS — D509 Iron deficiency anemia, unspecified: Secondary | ICD-10-CM

## 2015-10-23 LAB — CBC & DIFF AND RETIC
BASO%: 0.9 % (ref 0.0–2.0)
BASOS ABS: 0.1 10*3/uL (ref 0.0–0.1)
EOS ABS: 0.2 10*3/uL (ref 0.0–0.5)
EOS%: 3.1 % (ref 0.0–7.0)
HCT: 31.2 % — ABNORMAL LOW (ref 34.8–46.6)
HEMOGLOBIN: 9.8 g/dL — AB (ref 11.6–15.9)
IMMATURE RETIC FRACT: 7.8 % (ref 1.60–10.00)
LYMPH#: 1.1 10*3/uL (ref 0.9–3.3)
LYMPH%: 16.1 % (ref 14.0–49.7)
MCH: 29.5 pg (ref 25.1–34.0)
MCHC: 31.4 g/dL — ABNORMAL LOW (ref 31.5–36.0)
MCV: 94 fL (ref 79.5–101.0)
MONO#: 0.3 10*3/uL (ref 0.1–0.9)
MONO%: 4.1 % (ref 0.0–14.0)
NEUT%: 75.8 % (ref 38.4–76.8)
NEUTROS ABS: 4.9 10*3/uL (ref 1.5–6.5)
NRBC: 0 % (ref 0–0)
Platelets: 204 10*3/uL (ref 145–400)
RBC: 3.32 10*6/uL — ABNORMAL LOW (ref 3.70–5.45)
RDW: 20.8 % — AB (ref 11.2–14.5)
RETIC %: 4.66 % — AB (ref 0.70–2.10)
Retic Ct Abs: 154.71 10*3/uL — ABNORMAL HIGH (ref 33.70–90.70)
WBC: 6.5 10*3/uL (ref 3.9–10.3)

## 2015-10-23 MED ORDER — DARBEPOETIN ALFA 200 MCG/0.4ML IJ SOSY
200.0000 ug | PREFILLED_SYRINGE | Freq: Once | INTRAMUSCULAR | Status: AC
  Administered 2015-10-23: 200 ug via SUBCUTANEOUS
  Filled 2015-10-23: qty 0.4

## 2015-10-24 ENCOUNTER — Telehealth: Payer: Self-pay | Admitting: Hematology and Oncology

## 2015-10-24 DIAGNOSIS — I10 Essential (primary) hypertension: Secondary | ICD-10-CM | POA: Diagnosis not present

## 2015-10-24 DIAGNOSIS — J189 Pneumonia, unspecified organism: Secondary | ICD-10-CM | POA: Diagnosis not present

## 2015-10-24 DIAGNOSIS — E1149 Type 2 diabetes mellitus with other diabetic neurological complication: Secondary | ICD-10-CM | POA: Diagnosis not present

## 2015-10-24 DIAGNOSIS — Z853 Personal history of malignant neoplasm of breast: Secondary | ICD-10-CM | POA: Diagnosis not present

## 2015-10-24 DIAGNOSIS — I2510 Atherosclerotic heart disease of native coronary artery without angina pectoris: Secondary | ICD-10-CM | POA: Diagnosis not present

## 2015-10-24 NOTE — Telephone Encounter (Signed)
Spoke with patient and she is aware of her appts and will get a print out at 1/31 appt with dr Diona Browner

## 2015-10-27 ENCOUNTER — Ambulatory Visit: Payer: Medicare Other | Admitting: Cardiology

## 2015-10-27 DIAGNOSIS — E1149 Type 2 diabetes mellitus with other diabetic neurological complication: Secondary | ICD-10-CM | POA: Diagnosis not present

## 2015-10-27 DIAGNOSIS — Z853 Personal history of malignant neoplasm of breast: Secondary | ICD-10-CM | POA: Diagnosis not present

## 2015-10-27 DIAGNOSIS — J189 Pneumonia, unspecified organism: Secondary | ICD-10-CM | POA: Diagnosis not present

## 2015-10-27 DIAGNOSIS — I2510 Atherosclerotic heart disease of native coronary artery without angina pectoris: Secondary | ICD-10-CM | POA: Diagnosis not present

## 2015-10-27 DIAGNOSIS — I10 Essential (primary) hypertension: Secondary | ICD-10-CM | POA: Diagnosis not present

## 2015-10-28 ENCOUNTER — Ambulatory Visit (INDEPENDENT_AMBULATORY_CARE_PROVIDER_SITE_OTHER)
Admission: RE | Admit: 2015-10-28 | Discharge: 2015-10-28 | Disposition: A | Discharge status: Home / Self Care | Payer: Medicare Other | Source: Ambulatory Visit | Attending: Family Medicine | Admitting: Family Medicine

## 2015-10-28 ENCOUNTER — Ambulatory Visit (INDEPENDENT_AMBULATORY_CARE_PROVIDER_SITE_OTHER): Payer: Medicare Other | Admitting: Family Medicine

## 2015-10-28 ENCOUNTER — Encounter: Payer: Self-pay | Admitting: Family Medicine

## 2015-10-28 VITALS — BP 120/74 | HR 73 | Temp 97.4°F | Ht 62.5 in | Wt 179.5 lb

## 2015-10-28 DIAGNOSIS — J948 Other specified pleural conditions: Secondary | ICD-10-CM

## 2015-10-28 DIAGNOSIS — N179 Acute kidney failure, unspecified: Secondary | ICD-10-CM | POA: Diagnosis not present

## 2015-10-28 DIAGNOSIS — E0840 Diabetes mellitus due to underlying condition with diabetic neuropathy, unspecified: Secondary | ICD-10-CM

## 2015-10-28 DIAGNOSIS — J90 Pleural effusion, not elsewhere classified: Secondary | ICD-10-CM

## 2015-10-28 DIAGNOSIS — J189 Pneumonia, unspecified organism: Secondary | ICD-10-CM

## 2015-10-28 DIAGNOSIS — I10 Essential (primary) hypertension: Secondary | ICD-10-CM

## 2015-10-28 DIAGNOSIS — N184 Chronic kidney disease, stage 4 (severe): Secondary | ICD-10-CM | POA: Diagnosis not present

## 2015-10-28 DIAGNOSIS — E785 Hyperlipidemia, unspecified: Secondary | ICD-10-CM

## 2015-10-28 MED ORDER — GLIPIZIDE ER 5 MG PO TB24: 5.0000 mg | ORAL_TABLET | Freq: Every day | ORAL | Status: DC

## 2015-10-28 NOTE — Assessment & Plan Note (Signed)
New diagnosis, pt LDL goal already  < 70. Repeat in 1 year.

## 2015-10-28 NOTE — Assessment & Plan Note (Signed)
Resolved

## 2015-10-28 NOTE — Patient Instructions (Addendum)
Lungs look great!  Keep appt with kidney specialist once made.  Stop at lab on way out. Decrease glipizide to 5 mg daily. Follow blood sugars in morning.. Goal FBS is 80-120. Do not skip meals. Call if low blood sugars continue.  Go back to taking metoprolol tartrate as you were previously as I feel this is correct per Dr. Mare Ferrari note in 05/2014.

## 2015-10-28 NOTE — Progress Notes (Signed)
Subjective:    Patient ID: [name], female    DOB: [birth year], 82 y.o.   MRN: [id number]  HPI  82 year old female presents for 45 week follow up to re-evaluate CXR for resolution of  CA PNA.   She was hospitalized from 12/9 to 12/11 for CA PNA and anemia. 12/19 She was admitted for acute encephalopathy and altered mental status. Chest x-ray showed a left pleural effusion. 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   Today's CXR: shows: Resolution of the previously seen left lower lobe opacity.    She has recently seen a Hemetologist: Dr. Elson Areas. Summary of findings:  The most likely cause of her anemia is anemia of chronic disease and mild iron deficiency anemia. I reinforced the importance of oral iron supplement. she will return here every 2 weeks for blood work monitoring and ESA injection. If her hemoglobin is less than 8, we will give her blood transfusion. We discussed some of the risks, benefits, and alternatives of erythropoietin stimulating agents such as Aranesp. The patient is symptomatic from anemia and the EPO level is low. Some of the side-effects to be expected including risks of allergic reactions, skin rashes, headaches, risk of blood clots including heart attack and stroke. There is rare risks of causing growth of cancers.The patient is willing to proceed and went ahead to sign consent today. The goal is to keep hemoglobin greater than 11 g.  1/26 cbc: 9.8 up from 7.8 3 weeks ago.   She has been referred to nephrology for CKD stage 4 as kidney function has worsened at last check 10/02/2015.  DM: Had not eaten very well that day.  On actos and glipizide.  Lab Results  Component Value Date   HGBA1C 6.2* 09/15/2015  She had episode of confusion later that day Her sugars dropped low 4 days ago.. CBG decrease 38, drank OJ, went to 58 hen 70. Usually CBG 83-113. Eating small meals daily but poor appetite.  HTN :  Per daughter: Confusion about the med.. She has been taking metoprolol ER  HR 73  BP Readings from Last 3 Encounters:  10/28/15 120/74  10/23/15 141/51  10/09/15 145/47       Review of Systems  Constitutional: Negative for fever and fatigue.  HENT: Negative for ear pain.   Eyes: Negative for pain.  Respiratory: Negative for chest tightness and shortness of breath.   Cardiovascular: Negative for chest pain, palpitations and leg swelling.  Gastrointestinal: Negative for abdominal pain.  Genitourinary: Negative for dysuria.       Objective:   Physical Exam  Constitutional: Vital signs are normal. She appears well-developed and well-nourished. She is cooperative.  Non-toxic appearance. She does not appear ill. No distress.  Elderly female in NAD  HENT:  Head: Normocephalic.  Right Ear: Hearing, tympanic membrane, external ear and ear canal normal. Tympanic membrane is not erythematous, not retracted and not bulging.  Left Ear: Hearing, tympanic membrane, external ear and ear canal normal. Tympanic membrane is not erythematous, not retracted and not bulging.  Nose: No mucosal edema or rhinorrhea. Right sinus exhibits no maxillary sinus tenderness and no frontal sinus tenderness. Left sinus exhibits no maxillary sinus tenderness and no frontal sinus tenderness.  Mouth/Throat: Uvula is midline, oropharynx is clear and moist and mucous membranes are normal.  Eyes: Conjunctivae, EOM and lids are normal. Pupils are equal, round, and reactive to light. Lids are everted and swept, no foreign  bodies found.  Neck: Trachea normal and normal range of motion. Neck supple. Carotid bruit is not present. No thyroid mass and no thyromegaly present.  Cardiovascular: Normal rate, regular rhythm, S1 normal, S2 normal, normal heart sounds, intact distal pulses and normal pulses.  Exam reveals no gallop and no friction rub.   No murmur heard. Pulmonary/Chest: Effort normal and breath sounds normal. No  tachypnea. No respiratory distress. She has no decreased breath sounds. She has no wheezes. She has no rhonchi. She has no rales.  Abdominal: Soft. Normal appearance and bowel sounds are normal. There is no tenderness.  Neurological: She is alert.  Skin: Skin is warm, dry and intact. No rash noted.  Psychiatric: Her speech is normal and behavior is normal. Judgment and thought content normal. Her mood appears not anxious. Cognition and memory are normal. She does not exhibit a depressed mood.          Assessment & Plan:

## 2015-10-28 NOTE — Assessment & Plan Note (Signed)
BP now elevated of meds. Restart as now N/V resolved.

## 2015-10-28 NOTE — Assessment & Plan Note (Signed)
Bp well controlled but appear incorrect med was filled by NP at cards office last OV ( no note of planned change) in early 08/2015 before hospitalization. She was previously on metoprolol tartrate BID and is now on Metoprolol succinate BID which is not a BID med. Encouraged pt to verifywhich med is correct for now, but told to take the one she was at last OV with Dr. Mare Ferrari 05/2014.

## 2015-10-28 NOTE — Assessment & Plan Note (Signed)
Decreae CR in hospital with N?V, improved with IVF to baseline 1.27. 3 week ago recheck showed Cr had worsened to 1.9. Agree with referral to nephrology. Will re-eval today.

## 2015-10-28 NOTE — Assessment & Plan Note (Signed)
Resolved with fluids. Back at baseline CKD.

## 2015-10-28 NOTE — Assessment & Plan Note (Signed)
Has appt with heme for further evaluation and treatment.

## 2015-10-28 NOTE — Assessment & Plan Note (Signed)
Recheck in 1 month . LDL was not at goal at last check. Now on atorvastatin 80 mg daily. Lab Results  Component Value Date   CHOL 208* 09/16/2015   HDL 27* 09/16/2015   LDLCALC 140* 09/16/2015   LDLDIRECT 90.0 07/25/2015   TRIG 204* 09/16/2015   CHOLHDL 7.7 09/16/2015

## 2015-10-28 NOTE — Assessment & Plan Note (Signed)
Pt not eating much . CBGs on low end of normal. When skipped a meal one day CBG dropped to 38. Will decrease glipizide to 5mg  daily to avoid hypoglycemia. Follow FBS and 2hr PP at home. Call if too high or too low. Pt has lost 5 lbs with recent illness.

## 2015-10-28 NOTE — Assessment & Plan Note (Signed)
Resolved symptoms. Will return for repeat CXR in 4 weeks.  Associated weakness and fatigue will take some time to recover.

## 2015-10-28 NOTE — Progress Notes (Signed)
Pre visit review using our clinic review tool, if applicable. No additional management support is needed unless otherwise documented below in the visit note. 

## 2015-10-29 ENCOUNTER — Other Ambulatory Visit: Payer: Self-pay | Admitting: Cardiology

## 2015-10-29 DIAGNOSIS — C50911 Malignant neoplasm of unspecified site of right female breast: Secondary | ICD-10-CM | POA: Diagnosis not present

## 2015-10-29 DIAGNOSIS — I25810 Atherosclerosis of coronary artery bypass graft(s) without angina pectoris: Secondary | ICD-10-CM

## 2015-10-29 LAB — BASIC METABOLIC PANEL
BUN: 35 mg/dL — AB (ref 6–23)
CO2: 23 mEq/L (ref 19–32)
CREATININE: 1.77 mg/dL — AB (ref 0.40–1.20)
Calcium: 10.1 mg/dL (ref 8.4–10.5)
Chloride: 106 mEq/L (ref 96–112)
GFR: 29.3 mL/min — AB (ref >60.00)
GLUCOSE: 185 mg/dL — AB (ref 70–99)
Potassium: 4.9 mEq/L (ref 3.5–5.1)
SODIUM: 139 meq/L (ref 135–145)

## 2015-10-30 NOTE — Telephone Encounter (Signed)
Should the patient be taking metoprolol tartrate or metoprolol succinate? Last office visit has metoprolol succinate, but pharmacy is requesting metoprolol tartrate. Please advise. Thanks, MI

## 2015-10-31 DIAGNOSIS — I10 Essential (primary) hypertension: Secondary | ICD-10-CM | POA: Diagnosis not present

## 2015-10-31 DIAGNOSIS — I2510 Atherosclerotic heart disease of native coronary artery without angina pectoris: Secondary | ICD-10-CM | POA: Diagnosis not present

## 2015-10-31 DIAGNOSIS — Z853 Personal history of malignant neoplasm of breast: Secondary | ICD-10-CM | POA: Diagnosis not present

## 2015-10-31 DIAGNOSIS — J189 Pneumonia, unspecified organism: Secondary | ICD-10-CM | POA: Diagnosis not present

## 2015-10-31 DIAGNOSIS — E1149 Type 2 diabetes mellitus with other diabetic neurological complication: Secondary | ICD-10-CM | POA: Diagnosis not present

## 2015-11-05 DIAGNOSIS — E1149 Type 2 diabetes mellitus with other diabetic neurological complication: Secondary | ICD-10-CM | POA: Diagnosis not present

## 2015-11-05 DIAGNOSIS — Z853 Personal history of malignant neoplasm of breast: Secondary | ICD-10-CM | POA: Diagnosis not present

## 2015-11-05 DIAGNOSIS — J189 Pneumonia, unspecified organism: Secondary | ICD-10-CM | POA: Diagnosis not present

## 2015-11-05 DIAGNOSIS — I10 Essential (primary) hypertension: Secondary | ICD-10-CM | POA: Diagnosis not present

## 2015-11-05 DIAGNOSIS — I2510 Atherosclerotic heart disease of native coronary artery without angina pectoris: Secondary | ICD-10-CM | POA: Diagnosis not present

## 2015-11-06 ENCOUNTER — Other Ambulatory Visit: Payer: Self-pay | Admitting: Hematology and Oncology

## 2015-11-06 ENCOUNTER — Ambulatory Visit: Payer: Medicare Other

## 2015-11-06 ENCOUNTER — Other Ambulatory Visit (HOSPITAL_BASED_OUTPATIENT_CLINIC_OR_DEPARTMENT_OTHER): Payer: Medicare Other

## 2015-11-06 DIAGNOSIS — N189 Chronic kidney disease, unspecified: Principal | ICD-10-CM

## 2015-11-06 DIAGNOSIS — D631 Anemia in chronic kidney disease: Secondary | ICD-10-CM

## 2015-11-06 DIAGNOSIS — D509 Iron deficiency anemia, unspecified: Secondary | ICD-10-CM | POA: Diagnosis not present

## 2015-11-06 LAB — CBC & DIFF AND RETIC
BASO%: 1.1 % (ref 0.0–2.0)
BASOS ABS: 0.1 10*3/uL (ref 0.0–0.1)
EOS ABS: 0.3 10*3/uL (ref 0.0–0.5)
EOS%: 7.2 % — ABNORMAL HIGH (ref 0.0–7.0)
HEMATOCRIT: 35.5 % (ref 34.8–46.6)
HEMOGLOBIN: 11.1 g/dL — AB (ref 11.6–15.9)
IMMATURE RETIC FRACT: 4.5 % (ref 1.60–10.00)
LYMPH#: 1 10*3/uL (ref 0.9–3.3)
LYMPH%: 20.8 % (ref 14.0–49.7)
MCH: 29.8 pg (ref 25.1–34.0)
MCHC: 31.3 g/dL — ABNORMAL LOW (ref 31.5–36.0)
MCV: 95.4 fL (ref 79.5–101.0)
MONO#: 0.4 10*3/uL (ref 0.1–0.9)
MONO%: 7.6 % (ref 0.0–14.0)
NEUT#: 3 10*3/uL (ref 1.5–6.5)
NEUT%: 63.3 % (ref 38.4–76.8)
PLATELETS: 171 10*3/uL (ref 145–400)
RBC: 3.72 10*6/uL (ref 3.70–5.45)
RDW: 17.6 % — AB (ref 11.2–14.5)
RETIC %: 2.01 % (ref 0.70–2.10)
RETIC CT ABS: 74.77 10*3/uL (ref 33.70–90.70)
WBC: 4.7 10*3/uL (ref 3.9–10.3)

## 2015-11-06 LAB — FERRITIN: Ferritin: 94 ng/ml (ref 9–269)

## 2015-11-06 MED ORDER — DARBEPOETIN ALFA 200 MCG/0.4ML IJ SOSY: 200.0000 ug | PREFILLED_SYRINGE | Freq: Once | INTRAMUSCULAR | Status: DC

## 2015-11-07 ENCOUNTER — Encounter: Payer: Self-pay | Admitting: Family Medicine

## 2015-11-07 DIAGNOSIS — J189 Pneumonia, unspecified organism: Secondary | ICD-10-CM | POA: Diagnosis not present

## 2015-11-07 DIAGNOSIS — I2510 Atherosclerotic heart disease of native coronary artery without angina pectoris: Secondary | ICD-10-CM | POA: Diagnosis not present

## 2015-11-07 DIAGNOSIS — Z853 Personal history of malignant neoplasm of breast: Secondary | ICD-10-CM | POA: Diagnosis not present

## 2015-11-07 DIAGNOSIS — E1149 Type 2 diabetes mellitus with other diabetic neurological complication: Secondary | ICD-10-CM | POA: Diagnosis not present

## 2015-11-07 DIAGNOSIS — I10 Essential (primary) hypertension: Secondary | ICD-10-CM | POA: Diagnosis not present

## 2015-11-14 DIAGNOSIS — C50911 Malignant neoplasm of unspecified site of right female breast: Secondary | ICD-10-CM | POA: Diagnosis not present

## 2015-11-19 ENCOUNTER — Encounter: Payer: Self-pay | Admitting: Cardiology

## 2015-11-19 ENCOUNTER — Ambulatory Visit (INDEPENDENT_AMBULATORY_CARE_PROVIDER_SITE_OTHER): Payer: Medicare Other | Admitting: Cardiology

## 2015-11-19 VITALS — BP 126/58 | HR 82 | Ht 64.0 in | Wt 178.0 lb

## 2015-11-19 DIAGNOSIS — R0600 Dyspnea, unspecified: Secondary | ICD-10-CM | POA: Diagnosis not present

## 2015-11-19 DIAGNOSIS — I2510 Atherosclerotic heart disease of native coronary artery without angina pectoris: Secondary | ICD-10-CM | POA: Diagnosis not present

## 2015-11-19 DIAGNOSIS — I4510 Unspecified right bundle-branch block: Secondary | ICD-10-CM

## 2015-11-19 DIAGNOSIS — I119 Hypertensive heart disease without heart failure: Secondary | ICD-10-CM

## 2015-11-19 DIAGNOSIS — I2583 Coronary atherosclerosis due to lipid rich plaque: Secondary | ICD-10-CM

## 2015-11-19 NOTE — Progress Notes (Signed)
Cardiology Office Note   Date:  11/19/2015   ID:  Brenda, Vaughan 12/28/1932, MRN [id number]  PCP:  Eliezer Lofts, MD  Cardiologist: Darlin Coco MD  No chief complaint on file.     History of Present Illness: [name] is a 82 y.o. female who presents for a scheduled follow-up visit.  HPI: The patient is a 82 year old female, who presents to clinic today for routine 1 year evaluation. She has a history of ischemic heart disease. She underwent coronary artery bypass graft surgery following an acute myocardial infarction and surgery was done on 12/22/06. She has not had any subsequent angina pectoris. She does have exertional dyspnea.  Her most recent echocardiogram on 09/17/15 showed an ejection fraction of 60-65% and grade 2 diastolic dysfunction.  There was aortic valve sclerosis without significant stenosis and there was mitral annular calcification.. She had a LexiScan Myoview stress test on 01/25/12 which showed a small inferior wall scar and no ischemia and her ejection fraction was 72%. The patient has a history of hypercholesterolemia and a history of diabetes mellitus with diabetic neuropathy. The patient was hospitalized twice in December 2016.  She was headed for pneumonia and for altered mental status and severe anemia with hemoglobin of 6.  Round to be iron deficient.  She subsequently saw Dr. Simeon Craft such for hematology evaluation felt that the main problem was related to her chronic kidney disease.  She has a GFR of 29.  There was also mild iron deficiency anemia.  The patient has had a good response to erythropoietin injections at the cancer center. The patient has a new right bundle branch block on EKG today.  She has not been having any dizzy spells or syncope.  She has not had any recent chest pain or increased shortness of breath and she is not having any peripheral edema.  Today in clinic, she reports that she has done fairly well. She denies CP but notes  mild DOE. She denies resting dyspnea. No syncope, near syncope. No palpitations. She reports full medication compliance  Past Medical History  Diagnosis Date  . Cellulitis     R ARM  . Coronary artery disease   . Hypothyroidism   . Hyperlipidemia   . Hypertension   . Breast cancer ([place])   . Uterine cancer ([place])   . History of breast cancer 10/02/2015  . History of uterine cancer 10/02/2015    Past Surgical History  Procedure Laterality Date  . Total abdominal hysterectomy  1993  . Mastectomy  1979    right, followed by 2 years chemo  . Total hip arthroplasty  08/2009    right  . Appendectomy    . US echocardiography  05/2009    EF 55-60%,MILD-MOD LVH,, MILD-MOD A STENOSIS,  . Nuclear stress test  07/2009    EF 69%,  MILD ISCHEMIA IN INFEROLATERAL WALL  . Coronary artery bypass graft  2008  . Cardiac catheterization       Current Outpatient Prescriptions  Medication Sig Dispense Refill  . aspirin 81 MG tablet Take 81 mg by mouth daily.      Marland Kitchen atorvastatin (LIPITOR) 80 MG tablet Take 1 tablet (80 mg total) by mouth daily. 30 tablet 1  . Blood Glucose Monitoring Suppl (ACCU-CHEK AVIVA) device Use to check blood sugar once daily.  Dx: E11.43 1 each 0  . cyanocobalamin 1000 MCG tablet Take 1,000 mcg by mouth daily. Reported on 09/11/2015    . Ferrous Sulfate (  IRON) 325 (65 FE) MG TABS Take 1 tablet by mouth 2 (two) times daily. Reported on 09/11/2015    . gabapentin (NEURONTIN) 100 MG capsule Take 100 mg by mouth 3 (three) times daily. Reported on 09/11/2015    . glipiZIDE (GLUCOTROL XL) 5 MG 24 hr tablet Take 1 tablet (5 mg total) by mouth daily with breakfast. 30 tablet 11  . glucose blood (ACCU-CHEK AVIVA) test strip Use to check blood sugar once daily.  Dx: E11.43 100 each 3  . Lancet Devices (ACCU-CHEK SOFTCLIX) lancets Use to check blood sugar once daily.  Dx: E11.43 1 each 0  . Lancets (ACCU-CHEK SOFT TOUCH) lancets Use to check blood sugar once daily.  Dx: E11.43 100 each 3   . levothyroxine (SYNTHROID, LEVOTHROID) 50 MCG tablet TAKE 1 TABLET BY MOUTH EVERY DAY 30 tablet 10  . losartan (COZAAR) 50 MG tablet Take 1 tablet (50 mg total) by mouth daily. 90 tablet 1  . meclizine (ANTIVERT) 25 MG tablet Take 1 tablet (25 mg total) by mouth 3 (three) times daily as needed for dizziness. 30 tablet 0  . metoprolol tartrate (LOPRESSOR) 25 MG tablet Take 25 mg by mouth 2 (two) times daily.    . metoprolol tartrate (LOPRESSOR) 25 MG tablet TAKE 1 TABLET BY MOUTH TWICE A DAY 180 tablet 2  . nitroGLYCERIN (NITROSTAT) 0.4 MG SL tablet Place 1 tablet (0.4 mg total) under the tongue every 5 (five) minutes as needed for chest pain (x 3 tabs daily). 25 tablet 3  . ondansetron (ZOFRAN ODT) 4 MG disintegrating tablet Take 1 tablet (4 mg total) by mouth every 8 (eight) hours as needed for nausea or vomiting. 30 tablet 0  . pioglitazone (ACTOS) 30 MG tablet Take 30 mg by mouth daily. Reported on 09/11/2015    . senna-docusate (SENOKOT-S) 8.6-50 MG tablet Take 1 tablet by mouth at bedtime as needed for mild constipation.    . sertraline (ZOLOFT) 25 MG tablet TAKE 1 TABLET (25 MG TOTAL) BY MOUTH DAILY. 30 tablet 5  . triamterene-hydrochlorothiazide (DYAZIDE) 37.5-25 MG capsule Take 1 capsule by mouth daily.  0   No current facility-administered medications for this visit.    Allergies:   Levofloxacin and Morphine and related    Social History:  The patient  reports that she has never smoked. She has never used smokeless tobacco. She reports that she does not drink alcohol or use illicit drugs.   Family History:  The patient's family history includes Cancer in her mother; Diabetes in her brother and mother; Heart disease (age of onset: 37) in her father; Throat cancer in her brother.    ROS:  Please see the history of present illness.   Otherwise, review of systems are positive for none.   All other systems are reviewed and negative.    PHYSICAL EXAM: VS:  There were no vitals taken  for this visit. , BMI There is no weight on file to calculate BMI. GEN: Well nourished, well developed, in no acute distress HEENT: normal Neck: no JVD, carotid bruits, or masses Cardiac: RRR; there is a soft systolic ejection murmur at the aortic area.  No diastolic murmur.,  No rubs, or gallops,no edema  Respiratory:  clear to auscultation bilaterally, normal work of breathing GI: soft, nontender, nondistended, + BS MS: no deformity or atrophy Skin: warm and dry, no rash Neuro:  Strength and sensation are intact Psych: euthymic mood, full affect   EKG:  EKG is ordered today. The ekg  ordered today demonstrates sinus rhythm at 83 bpm.  First degree AV block.  Since previous tracing of 09/15/15, right bundle branch block is new.   Recent Labs: 09/16/2015: B Natriuretic Peptide 572.6*; TSH 1.164 09/17/2015: Magnesium 2.4 10/02/2015: ALT 11 10/28/2015: BUN 35*; Creatinine, Ser 1.77*; Potassium 4.9; Sodium 139 11/06/2015: HGB 11.1*; Platelets 171    Lipid Panel    Component Value Date/Time   CHOL 208* 09/16/2015 0412   TRIG 204* 09/16/2015 0412   HDL 27* 09/16/2015 0412   CHOLHDL 7.7 09/16/2015 0412   VLDL 41* 09/16/2015 0412   LDLCALC 140* 09/16/2015 0412   LDLDIRECT 90.0 07/25/2015 1104      Wt Readings from Last 3 Encounters:  10/28/15 179 lb 8 oz (81.421 kg)  10/09/15 184 lb 3.2 oz (83.553 kg)  10/02/15 184 lb 1.6 oz (83.507 kg)       ASSESSMENT AND PLAN:  1. Dyspnea with exertion: Stable.  Not currently on diuretic.  She appears to be euvolemic clinically.  No edema.  2. CAD: s/p CABG in 2008.  No recurrent angina pectoris.  In January current medication  3.  New right bundle branch block.  No symptoms  4.  Anemia of chronic disease secondary to chronic kidney disease stage IV, followed by Dr. Alvy Bimler and responding to erythropoietin injections.  Some mild iron deficiency anemia.  No invasive workup or colonoscopy planned.  Current medicines are reviewed at length  with the patient today.  The patient does not have concerns regarding medicines.  The following changes have been made:  no change  Labs/ tests ordered today include:  No orders of the defined types were placed in this encounter.     Disposition:   Continue current medication.  Return in 4 months for office visit and EKG with Dr. Sallyanne Kuster at the [facility] office which will be closer to her home  Signed, Darlin Coco MD 11/19/2015 10:53 AM    [address], [place], [place]  [address] Phone: [phone]; Fax: [phone]

## 2015-11-19 NOTE — Patient Instructions (Signed)
Medication Instructions:  Your physician recommends that you continue on your current medications as directed. Please refer to the Current Medication list given to you today.  Labwork: NONE  Testing/Procedures: NONE  Follow-Up: Your physician wants you to follow-up in: [place] will receive a reminder letter in the mail two months in advance. If you don't receive a letter, please call our office to schedule the follow-up appointment.  If you need a refill on your cardiac medications before your next appointment, please call your pharmacy.

## 2015-11-20 ENCOUNTER — Ambulatory Visit: Payer: Self-pay

## 2015-11-20 ENCOUNTER — Other Ambulatory Visit: Payer: Self-pay

## 2015-12-04 ENCOUNTER — Other Ambulatory Visit (HOSPITAL_BASED_OUTPATIENT_CLINIC_OR_DEPARTMENT_OTHER): Payer: Medicare Other

## 2015-12-04 ENCOUNTER — Ambulatory Visit (HOSPITAL_BASED_OUTPATIENT_CLINIC_OR_DEPARTMENT_OTHER): Payer: Medicare Other

## 2015-12-04 VITALS — BP 178/47 | HR 57 | Temp 98.2°F | Resp 18

## 2015-12-04 DIAGNOSIS — D509 Iron deficiency anemia, unspecified: Secondary | ICD-10-CM

## 2015-12-04 DIAGNOSIS — N184 Chronic kidney disease, stage 4 (severe): Secondary | ICD-10-CM

## 2015-12-04 DIAGNOSIS — D631 Anemia in chronic kidney disease: Secondary | ICD-10-CM

## 2015-12-04 DIAGNOSIS — N189 Chronic kidney disease, unspecified: Secondary | ICD-10-CM

## 2015-12-04 LAB — CBC & DIFF AND RETIC
BASO%: 0.6 % (ref 0.0–2.0)
BASOS ABS: 0 10*3/uL (ref 0.0–0.1)
EOS ABS: 0.4 10*3/uL (ref 0.0–0.5)
EOS%: 6 % (ref 0.0–7.0)
HCT: 31.4 % — ABNORMAL LOW (ref 34.8–46.6)
HGB: 10.3 g/dL — ABNORMAL LOW (ref 11.6–15.9)
IMMATURE RETIC FRACT: 10 % (ref 1.60–10.00)
LYMPH%: 20.2 % (ref 14.0–49.7)
MCH: 29.9 pg (ref 25.1–34.0)
MCHC: 32.8 g/dL (ref 31.5–36.0)
MCV: 91 fL (ref 79.5–101.0)
MONO#: 0.5 10*3/uL (ref 0.1–0.9)
MONO%: 6.8 % (ref 0.0–14.0)
NEUT%: 66.4 % (ref 38.4–76.8)
NEUTROS ABS: 4.6 10*3/uL (ref 1.5–6.5)
PLATELETS: 135 10*3/uL — AB (ref 145–400)
RBC: 3.45 10*6/uL — AB (ref 3.70–5.45)
RDW: 14.7 % — ABNORMAL HIGH (ref 11.2–14.5)
RETIC CT ABS: 47.61 10*3/uL (ref 33.70–90.70)
Retic %: 1.38 % (ref 0.70–2.10)
WBC: 6.9 10*3/uL (ref 3.9–10.3)
lymph#: 1.4 10*3/uL (ref 0.9–3.3)
nRBC: 0 % (ref 0–0)

## 2015-12-04 MED ORDER — DARBEPOETIN ALFA 200 MCG/0.4ML IJ SOSY
200.0000 ug | PREFILLED_SYRINGE | Freq: Once | INTRAMUSCULAR | Status: AC
  Administered 2015-12-04: 200 ug via SUBCUTANEOUS
  Filled 2015-12-04: qty 0.4

## 2015-12-04 NOTE — Patient Instructions (Signed)
Darbepoetin Alfa injection What is this medicine? DARBEPOETIN ALFA (dar be POE e tin AL fa) helps your body make more red blood cells. It is used to treat anemia caused by chronic kidney failure and chemotherapy. This medicine may be used for other purposes; ask your health care provider or pharmacist if you have questions. What should I tell my health care provider before I take this medicine? They need to know if you have any of these conditions: -blood clotting disorders or history of blood clots -cancer patient not on chemotherapy -cystic fibrosis -heart disease, such as angina, heart failure, or a history of a heart attack -hemoglobin level of 12 g/dL or greater -high blood pressure -low levels of folate, iron, or vitamin B12 -seizures -an unusual or allergic reaction to darbepoetin, erythropoietin, albumin, hamster proteins, latex, other medicines, foods, dyes, or preservatives -pregnant or trying to get pregnant -breast-feeding How should I use this medicine? This medicine is for injection into a vein or under the skin. It is usually given by a health care professional in a hospital or clinic setting. If you get this medicine at home, you will be taught how to prepare and give this medicine. Do not shake the solution before you withdraw a dose. Use exactly as directed. Take your medicine at regular intervals. Do not take your medicine more often than directed. It is important that you put your used needles and syringes in a special sharps container. Do not put them in a trash can. If you do not have a sharps container, call your pharmacist or healthcare provider to get one. Talk to your pediatrician regarding the use of this medicine in children. While this medicine may be used in children as young as 1 year for selected conditions, precautions do apply. Overdosage: If you think you have taken too much of this medicine contact a poison control center or emergency room at once. NOTE:  This medicine is only for you. Do not share this medicine with others. What if I miss a dose? If you miss a dose, take it as soon as you can. If it is almost time for your next dose, take only that dose. Do not take double or extra doses. What may interact with this medicine? Do not take this medicine with any of the following medications: -epoetin alfa This list may not describe all possible interactions. Give your health care provider a list of all the medicines, herbs, non-prescription drugs, or dietary supplements you use. Also tell them if you smoke, drink alcohol, or use illegal drugs. Some items may interact with your medicine. What should I watch for while using this medicine? Visit your prescriber or health care professional for regular checks on your progress and for the needed blood tests and blood pressure measurements. It is especially important for the doctor to make sure your hemoglobin level is in the desired range, to limit the risk of potential side effects and to give you the best benefit. Keep all appointments for any recommended tests. Check your blood pressure as directed. Ask your doctor what your blood pressure should be and when you should contact him or her. As your body makes more red blood cells, you may need to take iron, folic acid, or vitamin B supplements. Ask your doctor or health care provider which products are right for you. If you have kidney disease continue dietary restrictions, even though this medication can make you feel better. Talk with your doctor or health care professional about the   foods you eat and the vitamins that you take. What side effects may I notice from receiving this medicine? Side effects that you should report to your doctor or health care professional as soon as possible: -allergic reactions like skin rash, itching or hives, swelling of the face, lips, or tongue -breathing problems -changes in vision -chest pain -confusion, trouble speaking  or understanding -feeling faint or lightheaded, falls -high blood pressure -muscle aches or pains -pain, swelling, warmth in the leg -rapid weight gain -severe headaches -sudden numbness or weakness of the face, arm or leg -trouble walking, dizziness, loss of balance or coordination -seizures (convulsions) -swelling of the ankles, feet, hands -unusually weak or tired Side effects that usually do not require medical attention (report to your doctor or health care professional if they continue or are bothersome): -diarrhea -fever, chills (flu-like symptoms) -headaches -nausea, vomiting -redness, stinging, or swelling at site where injected This list may not describe all possible side effects. Call your doctor for medical advice about side effects. You may report side effects to FDA at 1-800-FDA-1088. Where should I keep my medicine? Keep out of the reach of children. Store in a refrigerator between 2 and 8 degrees C (36 and 46 degrees F). Do not freeze. Do not shake. Throw away any unused portion if using a single-dose vial. Throw away any unused medicine after the expiration date. NOTE: This sheet is a summary. It may not cover all possible information. If you have questions about this medicine, talk to your doctor, pharmacist, or health care provider.    2016, Elsevier/[employer] Standard. (2008-08-27 10:23:57)  

## 2015-12-04 NOTE — Progress Notes (Signed)
Patient states that blood pressure readings are within normal range when she gets readings at home.  Patient to log daily blood pressure readings and bring to next office visit.  Patient verifies understanding.

## 2015-12-05 DIAGNOSIS — E119 Type 2 diabetes mellitus without complications: Secondary | ICD-10-CM | POA: Diagnosis not present

## 2015-12-05 DIAGNOSIS — H348122 Central retinal vein occlusion, left eye, stable: Secondary | ICD-10-CM | POA: Diagnosis not present

## 2015-12-05 DIAGNOSIS — H43813 Vitreous degeneration, bilateral: Secondary | ICD-10-CM | POA: Diagnosis not present

## 2015-12-05 DIAGNOSIS — H35372 Puckering of macula, left eye: Secondary | ICD-10-CM | POA: Diagnosis not present

## 2015-12-09 ENCOUNTER — Encounter: Payer: Self-pay | Admitting: Family Medicine

## 2015-12-09 ENCOUNTER — Ambulatory Visit (INDEPENDENT_AMBULATORY_CARE_PROVIDER_SITE_OTHER): Payer: Medicare Other | Admitting: Family Medicine

## 2015-12-09 VITALS — BP 158/62 | HR 72 | Temp 97.9°F | Ht 64.0 in | Wt 183.8 lb

## 2015-12-09 DIAGNOSIS — I10 Essential (primary) hypertension: Secondary | ICD-10-CM

## 2015-12-09 DIAGNOSIS — E785 Hyperlipidemia, unspecified: Secondary | ICD-10-CM

## 2015-12-09 DIAGNOSIS — J189 Pneumonia, unspecified organism: Secondary | ICD-10-CM | POA: Diagnosis not present

## 2015-12-09 DIAGNOSIS — E0840 Diabetes mellitus due to underlying condition with diabetic neuropathy, unspecified: Secondary | ICD-10-CM

## 2015-12-09 DIAGNOSIS — E1143 Type 2 diabetes mellitus with diabetic autonomic (poly)neuropathy: Secondary | ICD-10-CM

## 2015-12-09 DIAGNOSIS — N184 Chronic kidney disease, stage 4 (severe): Secondary | ICD-10-CM

## 2015-12-09 LAB — HM DIABETES FOOT EXAM

## 2015-12-09 NOTE — Assessment & Plan Note (Signed)
Resolved

## 2015-12-09 NOTE — Progress Notes (Signed)
Pre visit review using our clinic review tool, if applicable. No additional management support is needed unless otherwise documented below in the visit note. 

## 2015-12-09 NOTE — Progress Notes (Signed)
Subjective:    Patient ID: [name], female    DOB: 1933-02-23, 82 y.o.   MRN: [id number]  HPI  82 year old female presents for follow up diabetes. CA PNA in 09/2105, resolved.  Anemia of chronic disease now followed by Hemetology.. Pt on ESA inj.  Hg 10-11  CKD Has not yet seen nephrology.. They never called back to schedule appt.  Diabetes:  Well controlled on  actos and glipizide in past.. Due for re-eval. Lab Results  Component Value Date   HGBA1C 6.2* 09/15/2015  Using medications without difficulties: Hypoglycemic episodes: 62 Hyperglycemic episodes:none Feet problems: none Blood Sugars averaging:79-103 eye exam within last year: retinopathy, done in last year.  Hypertension:  Inadequate control on metoprolol and diazide. BP Readings from Last 3 Encounters:  12/09/15 158/62  12/04/15 178/47  11/19/15 126/58  Using medication without problems or lightheadedness: None Chest pain with exertion:NOne Edema:None Short of breath:None Average home BPs:117/51 up to 164/64 in last week Other issues:  Recent OV with  Dr. Mare Ferrari reviewed OV from 2/22 No changes made.  She has gained weight back but has also been eating more salty potato chips .Marland Kitchen May be cause of BP elevation. Wt Readings from Last 3 Encounters:  12/09/15 183 lb 12 oz (83.348 kg)  11/19/15 178 lb (80.74 kg)  10/28/15 179 lb 8 oz (81.421 kg)    Social History /Family History/Past Medical History reviewed and updated if needed.  Review of Systems  Constitutional: Negative for fever and fatigue.  HENT: Negative for ear pain.   Eyes: Negative for pain.  Respiratory: Negative for chest tightness and shortness of breath.   Cardiovascular: Negative for chest pain, palpitations and leg swelling.  Gastrointestinal: Negative for abdominal pain.  Genitourinary: Negative for dysuria.       Objective:   Physical Exam  Constitutional: Vital signs are normal. She appears well-developed and  well-nourished. She is cooperative.  Non-toxic appearance. She does not appear ill. No distress.  In NAD  HENT:  Head: Normocephalic.  Right Ear: Hearing, tympanic membrane, external ear and ear canal normal. Tympanic membrane is not erythematous, not retracted and not bulging.  Left Ear: Hearing, tympanic membrane, external ear and ear canal normal. Tympanic membrane is not erythematous, not retracted and not bulging.  Nose: No mucosal edema or rhinorrhea. Right sinus exhibits no maxillary sinus tenderness and no frontal sinus tenderness. Left sinus exhibits no maxillary sinus tenderness and no frontal sinus tenderness.  Mouth/Throat: Uvula is midline, oropharynx is clear and moist and mucous membranes are normal.  Eyes: Conjunctivae, EOM and lids are normal. Pupils are equal, round, and reactive to light. Lids are everted and swept, no foreign bodies found.  Neck: Trachea normal and normal range of motion. Neck supple. Carotid bruit is not present. No thyroid mass and no thyromegaly present.  Cardiovascular: Normal rate, regular rhythm, S1 normal, S2 normal, normal heart sounds, intact distal pulses and normal pulses.  Exam reveals no gallop and no friction rub.   No murmur heard. Pulmonary/Chest: Effort normal and breath sounds normal. No tachypnea. No respiratory distress. She has no decreased breath sounds. She has no wheezes. She has no rhonchi. She has no rales.  Abdominal: Soft. Normal appearance and bowel sounds are normal. There is no tenderness.  Neurological: She is alert.  Skin: Skin is warm, dry and intact. No rash noted.  Psychiatric: Her speech is normal and behavior is normal. Judgment and thought content normal. Her mood appears not anxious.  Cognition and memory are normal. She does not exhibit a depressed mood.          Assessment & Plan:

## 2015-12-09 NOTE — Assessment & Plan Note (Signed)
Stable on current meds. nml foot exam.

## 2015-12-09 NOTE — Assessment & Plan Note (Signed)
Was well controlled until last week when increased na with starting to eat potato chips. Stop and follow BP over time. If remains elevated may need to increase losartan to 100 mg daily.

## 2015-12-09 NOTE — Assessment & Plan Note (Signed)
We will check A1C. If remains low given low CBG.. Will consider holding glipizide.

## 2015-12-09 NOTE — Assessment & Plan Note (Signed)
Due for re-eval. 

## 2015-12-09 NOTE — Patient Instructions (Addendum)
Return for fasting labs in next few days. Follow blood pressure At home. Call if remaining > 140/90 after 2 weeks for consideration of increasing losartan.  Stop potato chips.

## 2015-12-09 NOTE — Assessment & Plan Note (Signed)
Awaiting referral to renal. Will re-eval with labs. Needs better BP control.

## 2015-12-18 ENCOUNTER — Ambulatory Visit: Payer: Self-pay

## 2015-12-18 ENCOUNTER — Other Ambulatory Visit: Payer: Self-pay

## 2015-12-29 DIAGNOSIS — E119 Type 2 diabetes mellitus without complications: Secondary | ICD-10-CM | POA: Diagnosis not present

## 2015-12-29 DIAGNOSIS — C50911 Malignant neoplasm of unspecified site of right female breast: Secondary | ICD-10-CM | POA: Diagnosis not present

## 2015-12-29 DIAGNOSIS — D631 Anemia in chronic kidney disease: Secondary | ICD-10-CM | POA: Diagnosis not present

## 2015-12-29 DIAGNOSIS — N183 Chronic kidney disease, stage 3 (moderate): Secondary | ICD-10-CM | POA: Diagnosis not present

## 2015-12-29 DIAGNOSIS — I10 Essential (primary) hypertension: Secondary | ICD-10-CM | POA: Diagnosis not present

## 2016-01-01 ENCOUNTER — Ambulatory Visit: Payer: Medicare Other

## 2016-01-01 ENCOUNTER — Other Ambulatory Visit (HOSPITAL_BASED_OUTPATIENT_CLINIC_OR_DEPARTMENT_OTHER): Payer: Medicare Other

## 2016-01-01 ENCOUNTER — Telehealth: Payer: Self-pay | Admitting: Hematology and Oncology

## 2016-01-01 ENCOUNTER — Encounter: Payer: Self-pay | Admitting: Hematology and Oncology

## 2016-01-01 ENCOUNTER — Ambulatory Visit (HOSPITAL_BASED_OUTPATIENT_CLINIC_OR_DEPARTMENT_OTHER): Payer: Medicare Other | Admitting: Hematology and Oncology

## 2016-01-01 VITALS — BP 153/41 | HR 57 | Temp 97.7°F | Resp 17 | Wt 183.5 lb

## 2016-01-01 DIAGNOSIS — D631 Anemia in chronic kidney disease: Secondary | ICD-10-CM

## 2016-01-01 DIAGNOSIS — D509 Iron deficiency anemia, unspecified: Secondary | ICD-10-CM

## 2016-01-01 DIAGNOSIS — N189 Chronic kidney disease, unspecified: Secondary | ICD-10-CM

## 2016-01-01 DIAGNOSIS — I10 Essential (primary) hypertension: Secondary | ICD-10-CM | POA: Diagnosis not present

## 2016-01-01 LAB — CBC & DIFF AND RETIC
BASO%: 0.7 % (ref 0.0–2.0)
BASOS ABS: 0.1 10*3/uL (ref 0.0–0.1)
EOS%: 8 % — ABNORMAL HIGH (ref 0.0–7.0)
Eosinophils Absolute: 0.6 10*3/uL — ABNORMAL HIGH (ref 0.0–0.5)
HEMATOCRIT: 34.8 % (ref 34.8–46.6)
HGB: 11.2 g/dL — ABNORMAL LOW (ref 11.6–15.9)
Immature Retic Fract: 2.9 % (ref 1.60–10.00)
LYMPH#: 1.4 10*3/uL (ref 0.9–3.3)
LYMPH%: 19.5 % (ref 14.0–49.7)
MCH: 29.9 pg (ref 25.1–34.0)
MCHC: 32.2 g/dL (ref 31.5–36.0)
MCV: 93 fL (ref 79.5–101.0)
MONO#: 0.5 10*3/uL (ref 0.1–0.9)
MONO%: 7.5 % (ref 0.0–14.0)
NEUT#: 4.5 10*3/uL (ref 1.5–6.5)
NEUT%: 64.3 % (ref 38.4–76.8)
PLATELETS: 170 10*3/uL (ref 145–400)
RBC: 3.74 10*6/uL (ref 3.70–5.45)
RDW: 14.3 % (ref 11.2–14.5)
Retic %: 0.93 % (ref 0.70–2.10)
Retic Ct Abs: 34.78 10*3/uL (ref 33.70–90.70)
WBC: 7 10*3/uL (ref 3.9–10.3)

## 2016-01-01 NOTE — Assessment & Plan Note (Signed)
She is doing very well and only had 3 injections so far and her hemoglobin now is consistently above 11 g. I  will proceed to modify her treatment so that she will only come every other month. I will reassess again at the end of the year to see if we need to continue treatment indefinitely.

## 2016-01-01 NOTE — Progress Notes (Signed)
[place], MD SUMMARY OF HEMATOLOGIC HISTORY:  [name] 82 y.o. female is here because of severe anemia. The patient have chronic anemia on and off for the past few years. She has undergone extensive evaluation in the past. Early 2015, she was found to have severe anemia with hemoglobin of 6.8 and underwent extensive evaluation and was found to have iron deficiency. She was placed on oral iron supplement which she takes 2 a day for a while. She also received blood transfusion in the past. She did not undergo repeat GI evaluation last year. Her last colonoscopy was in 2009. Recently, she was hospitalized for pneumonia between 09/15/2015 to 09/19/2015. She had acute mental status change and acute renal failure which improved with conservative management She had a low hemoglobin of 6.4 and received blood transfusion in the hospital. She received antibiotic for pneumonia. At present time, she complained of fatigue. She denies recent chest pain on exertion, shortness of breath on minimal exertion, pre-syncopal episodes, or palpitations. She had not noticed any recent bleeding such as epistaxis, hematuria or hematochezia The patient denies over the counter NSAID ingestion. She is on antiplatelets agents. Her last colonoscopy was in 2009. She does not recall having EGD She denies any pica and eats a variety of diet. She never donated blood She also have interesting history of right breast cancer, discovered when she was 61 and premenopausal. From her collection, she may have stage II disease due to regional lymph nodes involvement. She had mastectomy and complete lymph node dissection on the right axilla. She recalled receiving some form of chemotherapy but never received tamoxifen or radiation. In the 90s, she was diagnosed with uterine cancer due to abnormal postmenopausal bleeding. She had complete hysterectomy but never received  adjuvant treatment. On 10/02/2015, she received blood transfusion for severe anemia On 10/09/2015, she started on Aranesp injection  INTERVAL HISTORY: [name] 82 y.o. female returns for further follow-up. She feels well. She has excellent energy level. The patient denies any recent signs or symptoms of bleeding such as spontaneous epistaxis, hematuria or hematochezia. Denies side effects from recent injection.  I have reviewed the past medical history, past surgical history, social history and family history with the patient and they are unchanged from previous note.  ALLERGIES:  is allergic to levofloxacin and morphine and related.  MEDICATIONS:  Current Outpatient Prescriptions  Medication Sig Dispense Refill  . aspirin 81 MG tablet Take 81 mg by mouth daily.      Marland Kitchen atorvastatin (LIPITOR) 80 MG tablet Take 1 tablet (80 mg total) by mouth daily. 30 tablet 1  . Blood Glucose Monitoring Suppl (ACCU-CHEK AVIVA) device Use to check blood sugar once daily.  Dx: E11.43 1 each 0  . cyanocobalamin 1000 MCG tablet Take 1,000 mcg by mouth daily. Reported on 09/11/2015    . Ferrous Sulfate (IRON) 325 (65 FE) MG TABS Take 1 tablet by mouth 2 (two) times daily. Reported on 09/11/2015    . gabapentin (NEURONTIN) 100 MG capsule Take 100 mg by mouth 3 (three) times daily. Reported on 09/11/2015    . glipiZIDE (GLUCOTROL XL) 5 MG 24 hr tablet Take 1 tablet (5 mg total) by mouth daily with breakfast. 30 tablet 11  . glucose blood (ACCU-CHEK AVIVA) test strip Use to check blood sugar once daily.  Dx: E11.43 100 each 3  . Lancet Devices (ACCU-CHEK SOFTCLIX) lancets Use to check blood sugar once daily.  Dx:  E11.43 1 each 0  . Lancets (ACCU-CHEK SOFT TOUCH) lancets Use to check blood sugar once daily.  Dx: E11.43 100 each 3  . levothyroxine (SYNTHROID, LEVOTHROID) 50 MCG tablet TAKE 1 TABLET BY MOUTH EVERY DAY 30 tablet 10  . losartan (COZAAR) 50 MG tablet Take 1 tablet (50 mg total) by mouth daily.  90 tablet 1  . metoprolol tartrate (LOPRESSOR) 25 MG tablet Take 25 mg by mouth 2 (two) times daily.    . nitroGLYCERIN (NITROSTAT) 0.4 MG SL tablet Place 1 tablet (0.4 mg total) under the tongue every 5 (five) minutes as needed for chest pain (x 3 tabs daily). 25 tablet 3  . pioglitazone (ACTOS) 30 MG tablet Take 30 mg by mouth daily. Reported on 09/11/2015    . sertraline (ZOLOFT) 25 MG tablet TAKE 1 TABLET (25 MG TOTAL) BY MOUTH DAILY. 30 tablet 5  . triamterene-hydrochlorothiazide (DYAZIDE) 37.5-25 MG capsule Take 1 capsule by mouth daily.  0   No current facility-administered medications for this visit.     REVIEW OF SYSTEMS:   Constitutional: Denies fevers, chills or night sweats Eyes: Denies blurriness of vision Ears, nose, mouth, throat, and face: Denies mucositis or sore throat Respiratory: Denies cough, dyspnea or wheezes Cardiovascular: Denies palpitation, chest discomfort or lower extremity swelling Gastrointestinal:  Denies nausea, heartburn or change in bowel habits Skin: Denies abnormal skin rashes Lymphatics: Denies new lymphadenopathy or easy bruising Neurological:Denies numbness, tingling or new weaknesses Behavioral/Psych: Mood is stable, no new changes  All other systems were reviewed with the patient and are negative.  PHYSICAL EXAMINATION: ECOG PERFORMANCE STATUS: 0 - Asymptomatic  Filed Vitals:   01/01/16 1111  BP: 153/41  Pulse: 57  Temp: 97.7 F (36.5 C)  Resp: 17   Filed Weights   01/01/16 1111  Weight: 183 lb 8 oz (83.235 kg)    GENERAL:alert, no distress and comfortable SKIN: skin color, texture, turgor are normal, no rashes or significant lesions EYES: normal, Conjunctiva are pink and non-injected, sclera clear Musculoskeletal:no cyanosis of digits and no clubbing  NEURO: alert & oriented x 3 with fluent speech, no focal motor/sensory deficits  LABORATORY DATA:  I have reviewed the data as listed Results for orders placed or performed in  visit on 01/01/16 (from the past 48 hour(s))  CBC & Diff and Retic     Status: Abnormal   Collection Time: 01/01/16 10:47 AM  Result Value Ref Range   WBC 7.0 3.9 - 10.3 10e3/uL   NEUT# 4.5 1.5 - 6.5 10e3/uL   HGB 11.2 (L) 11.6 - 15.9 g/dL   HCT 34.8 34.8 - 46.6 %   Platelets 170 145 - 400 10e3/uL   MCV 93.0 79.5 - 101.0 fL   MCH 29.9 25.1 - 34.0 pg   MCHC 32.2 31.5 - 36.0 g/dL   RBC 3.74 3.70 - 5.45 10e6/uL   RDW 14.3 11.2 - 14.5 %   lymph# 1.4 0.9 - 3.3 10e3/uL   MONO# 0.5 0.1 - 0.9 10e3/uL   Eosinophils Absolute 0.6 (H) 0.0 - 0.5 10e3/uL   Basophils Absolute 0.1 0.0 - 0.1 10e3/uL   NEUT% 64.3 38.4 - 76.8 %   LYMPH% 19.5 14.0 - 49.7 %   MONO% 7.5 0.0 - 14.0 %   EOS% 8.0 (H) 0.0 - 7.0 %   BASO% 0.7 0.0 - 2.0 %   Retic % 0.93 0.70 - 2.10 %   Retic Ct Abs 34.78 33.70 - 90.70 10e3/uL   Immature Retic Fract 2.90 1.60 -  10.00 %    Lab Results  Component Value Date   WBC 7.0 01/01/2016   HGB 11.2* 01/01/2016   HCT 34.8 01/01/2016   MCV 93.0 01/01/2016   PLT 170 01/01/2016    ASSESSMENT & PLAN:  Anemia in chronic kidney disease She is doing very well and only had 3 injections so far and her hemoglobin now is consistently above 11 g. I  will proceed to modify her treatment so that she will only come every other month. I will reassess again at the end of the year to see if we need to continue treatment indefinitely.   Essential hypertension she will continue current medical management. Her blood pressure is suboptimally controlled. I recommend close follow-up with primary care doctor for medication adjustment.    All questions were answered. The patient knows to call the clinic with any problems, questions or concerns. No barriers to learning was detected.  I spent 10 minutes counseling the patient face to face. The total time spent in the appointment was 15 minutes and more than 50% was on counseling.     Grabiel Schmutz, MD 4/6/20171:20 PM

## 2016-01-01 NOTE — Telephone Encounter (Signed)
Gave and printed appt sched and avs for pt for May, July and Sept

## 2016-01-01 NOTE — Assessment & Plan Note (Signed)
she will continue current medical management. Her blood pressure is suboptimally controlled. I recommend close follow-up with primary care doctor for medication adjustment.  

## 2016-01-02 ENCOUNTER — Telehealth: Payer: Self-pay | Admitting: Hematology and Oncology

## 2016-01-02 NOTE — Telephone Encounter (Signed)
returned call and lvm for pt confirming 5.11 appt cx and moved to 5.3

## 2016-01-09 ENCOUNTER — Other Ambulatory Visit: Payer: Self-pay | Admitting: Family Medicine

## 2016-01-11 NOTE — Telephone Encounter (Signed)
Last office visit 12/09/2015. Not on current medication list.  Refill?

## 2016-01-23 ENCOUNTER — Ambulatory Visit (INDEPENDENT_AMBULATORY_CARE_PROVIDER_SITE_OTHER): Payer: Medicare Other | Admitting: Family Medicine

## 2016-01-23 ENCOUNTER — Encounter: Payer: Self-pay | Admitting: Family Medicine

## 2016-01-23 VITALS — BP 157/58 | HR 59 | Temp 97.6°F | Ht 64.0 in | Wt 181.5 lb

## 2016-01-23 DIAGNOSIS — D509 Iron deficiency anemia, unspecified: Secondary | ICD-10-CM | POA: Diagnosis not present

## 2016-01-23 DIAGNOSIS — E785 Hyperlipidemia, unspecified: Secondary | ICD-10-CM

## 2016-01-23 DIAGNOSIS — E0843 Diabetes mellitus due to underlying condition with diabetic autonomic (poly)neuropathy: Secondary | ICD-10-CM

## 2016-01-23 DIAGNOSIS — I10 Essential (primary) hypertension: Secondary | ICD-10-CM | POA: Diagnosis not present

## 2016-01-23 DIAGNOSIS — E0840 Diabetes mellitus due to underlying condition with diabetic neuropathy, unspecified: Secondary | ICD-10-CM

## 2016-01-23 DIAGNOSIS — N184 Chronic kidney disease, stage 4 (severe): Secondary | ICD-10-CM

## 2016-01-23 LAB — HM DIABETES FOOT EXAM

## 2016-01-23 NOTE — Progress Notes (Signed)
Pre visit review using our clinic review tool, if applicable. No additional management support is needed unless otherwise documented below in the visit note. 

## 2016-01-23 NOTE — Assessment & Plan Note (Signed)
Elemant of white coat HTn. BPs well controlled at home.Marland Kitchen Keep at goal < 140/90 given CKD.

## 2016-01-23 NOTE — Assessment & Plan Note (Addendum)
Inadequate control last check Re-eval at next OV. On atorvastatin.

## 2016-01-23 NOTE — Assessment & Plan Note (Signed)
Secondary to DM and HTN. Followed by nephrology.

## 2016-01-23 NOTE — Assessment & Plan Note (Signed)
Stable control. 

## 2016-01-23 NOTE — Assessment & Plan Note (Signed)
IMproved with  ESA inj. Per heme.

## 2016-01-23 NOTE — Assessment & Plan Note (Signed)
Stable control per pt measurements. Encouraged exercise, weight loss, healthy eating habits.  Info on home strengthening exercises given. Needs to start walking some.

## 2016-01-23 NOTE — Progress Notes (Signed)
82 year old female presents for  6 month follow up, was seen for DM in 12/09/2015, last month as well. CA PNA in 09/2105, resolved.  Anemia of chronic disease now followed by Hemetology.. Pt on ESA inj. Hg 11.2 last check 01/01/2015  CKD nephrology seen 01/02/2016, felt secondary to DM and HTN.. Follow up 6 months.  Diabetes: Well controlled on actos and glipizide in past. Lab Results  Component Value Date   HGBA1C 6.2* 09/15/2015  Using medications without difficulties: Hypoglycemic episodes: 62 Hyperglycemic episodes:none Feet problems: none Blood Sugars averaging:68-90 eye exam within last year: retinopathy, done in last year.  Hypertension: Inadequate control on metoprolol and diazide.  Seems to have white HTN. BP Readings from Last 3 Encounters:  01/23/16 157/58  01/01/16 153/41  12/09/15 158/62  Using medication without problems or lightheadedness: None Chest pain with exertion:None Edema:None Short of breath:None Average home BPs: 124-137/57-88 Other issues: Recent OV with Dr. Mare Ferrari reviewed OV from 2/22 No changes made.  Wt Readings from Last 3 Encounters:  01/23/16 181 lb 8 oz (82.328 kg)  01/01/16 183 lb 8 oz (83.235 kg)  12/09/15 183 lb 12 oz (83.348 kg)   Lab Results  Component Value Date   CHOL 208* 09/16/2015   HDL 27* 09/16/2015   LDLCALC 140* 09/16/2015   LDLDIRECT 90.0 07/25/2015   TRIG 204* 09/16/2015   CHOLHDL 7.7 09/16/2015    Social History /Family History/Past Medical History reviewed and updated if needed.  Review of Systems  Constitutional: Negative for fever and fatigue.  HENT: Negative for ear pain.  Eyes: Negative for pain.  Respiratory: Negative for chest tightness and shortness of breath.  Cardiovascular: Negative for chest pain, palpitations and leg swelling.  Gastrointestinal: Negative for abdominal pain.  Genitourinary: Negative for dysuria.       Objective:   Physical Exam  Constitutional: Vital signs are normal.  She appears well-developed and well-nourished. She is cooperative. Non-toxic appearance. She does not appear ill. No distress.  In NAD  HENT:  Head: Normocephalic.  Right Ear: Hearing, tympanic membrane, external ear and ear canal normal. Tympanic membrane is not erythematous, not retracted and not bulging.  Left Ear: Hearing, tympanic membrane, external ear and ear canal normal. Tympanic membrane is not erythematous, not retracted and not bulging.  Nose: No mucosal edema or rhinorrhea. Right sinus exhibits no maxillary sinus tenderness and no frontal sinus tenderness. Left sinus exhibits no maxillary sinus tenderness and no frontal sinus tenderness.  Mouth/Throat: Uvula is midline, oropharynx is clear and moist and mucous membranes are normal.  Eyes: Conjunctivae, EOM and lids are normal. Pupils are equal, round, and reactive to light. Lids are everted and swept, no foreign bodies found.  Neck: Trachea normal and normal range of motion. Neck supple. Carotid bruit is not present. No thyroid mass and no thyromegaly present.  Cardiovascular: Normal rate, regular rhythm, S1 normal, S2 normal, normal heart sounds, intact distal pulses and normal pulses. Exam reveals no gallop and no friction rub.  No murmur heard. Pulmonary/Chest: Effort normal and breath sounds normal. No tachypnea. No respiratory distress. She has no decreased breath sounds. She has no wheezes. She has no rhonchi. She has no rales.  Abdominal: Soft. Normal appearance and bowel sounds are normal. There is no tenderness.  Neurological: She is alert.  Skin: Skin is warm, dry and intact. No rash noted.  Psychiatric: Her speech is normal and behavior is normal. Judgment and thought content normal. Her mood appears not anxious. Cognition and memory  are normal. She does not exhibit a depressed mood.   Diabetic foot exam: Normal inspection No skin breakdown No calluses  Normal DP pulses Normal sensation to light touch and  monofilament Nails normal

## 2016-01-28 ENCOUNTER — Other Ambulatory Visit (HOSPITAL_BASED_OUTPATIENT_CLINIC_OR_DEPARTMENT_OTHER): Payer: Medicare Other

## 2016-01-28 ENCOUNTER — Ambulatory Visit (HOSPITAL_BASED_OUTPATIENT_CLINIC_OR_DEPARTMENT_OTHER): Payer: Medicare Other

## 2016-01-28 VITALS — BP 158/50 | HR 52 | Temp 97.6°F

## 2016-01-28 DIAGNOSIS — D631 Anemia in chronic kidney disease: Secondary | ICD-10-CM | POA: Diagnosis not present

## 2016-01-28 DIAGNOSIS — N189 Chronic kidney disease, unspecified: Secondary | ICD-10-CM | POA: Diagnosis not present

## 2016-01-28 DIAGNOSIS — D509 Iron deficiency anemia, unspecified: Secondary | ICD-10-CM

## 2016-01-28 LAB — CBC & DIFF AND RETIC
BASO%: 0.8 % (ref 0.0–2.0)
BASOS ABS: 0.1 10*3/uL (ref 0.0–0.1)
EOS ABS: 0.6 10*3/uL — AB (ref 0.0–0.5)
EOS%: 9.7 % — ABNORMAL HIGH (ref 0.0–7.0)
HCT: 29 % — ABNORMAL LOW (ref 34.8–46.6)
HGB: 9.5 g/dL — ABNORMAL LOW (ref 11.6–15.9)
IMMATURE RETIC FRACT: 9.7 % (ref 1.60–10.00)
LYMPH#: 1.2 10*3/uL (ref 0.9–3.3)
LYMPH%: 20.8 % (ref 14.0–49.7)
MCH: 30.4 pg (ref 25.1–34.0)
MCHC: 32.8 g/dL (ref 31.5–36.0)
MCV: 92.7 fL (ref 79.5–101.0)
MONO#: 0.5 10*3/uL (ref 0.1–0.9)
MONO%: 8.9 % (ref 0.0–14.0)
NEUT#: 3.6 10*3/uL (ref 1.5–6.5)
NEUT%: 59.8 % (ref 38.4–76.8)
PLATELETS: 159 10*3/uL (ref 145–400)
RBC: 3.13 10*6/uL — ABNORMAL LOW (ref 3.70–5.45)
RDW: 15.3 % — AB (ref 11.2–14.5)
RETIC %: 2.35 % — AB (ref 0.70–2.10)
RETIC CT ABS: 73.56 10*3/uL (ref 33.70–90.70)
WBC: 6 10*3/uL (ref 3.9–10.3)

## 2016-01-28 MED ORDER — DARBEPOETIN ALFA 200 MCG/0.4ML IJ SOSY
200.0000 ug | PREFILLED_SYRINGE | Freq: Once | INTRAMUSCULAR | Status: AC
  Administered 2016-01-28: 200 ug via SUBCUTANEOUS
  Filled 2016-01-28: qty 0.4

## 2016-02-05 ENCOUNTER — Other Ambulatory Visit: Payer: Self-pay

## 2016-02-05 ENCOUNTER — Ambulatory Visit: Payer: Self-pay

## 2016-03-01 ENCOUNTER — Other Ambulatory Visit: Payer: Self-pay | Admitting: Family Medicine

## 2016-03-01 ENCOUNTER — Other Ambulatory Visit: Payer: Self-pay | Admitting: Cardiology

## 2016-03-01 NOTE — Telephone Encounter (Signed)
Rx(s) sent to pharmacy electronically.  

## 2016-03-09 ENCOUNTER — Telehealth: Payer: Self-pay | Admitting: Cardiovascular Disease

## 2016-03-10 DIAGNOSIS — E1151 Type 2 diabetes mellitus with diabetic peripheral angiopathy without gangrene: Secondary | ICD-10-CM | POA: Diagnosis not present

## 2016-03-10 DIAGNOSIS — L603 Nail dystrophy: Secondary | ICD-10-CM | POA: Diagnosis not present

## 2016-03-10 DIAGNOSIS — M71572 Other bursitis, not elsewhere classified, left ankle and foot: Secondary | ICD-10-CM | POA: Diagnosis not present

## 2016-03-10 DIAGNOSIS — I739 Peripheral vascular disease, unspecified: Secondary | ICD-10-CM | POA: Diagnosis not present

## 2016-03-11 ENCOUNTER — Ambulatory Visit: Payer: Self-pay | Admitting: Cardiovascular Disease

## 2016-03-11 NOTE — Telephone Encounter (Signed)
Closed encounter °

## 2016-03-12 DIAGNOSIS — H35373 Puckering of macula, bilateral: Secondary | ICD-10-CM | POA: Diagnosis not present

## 2016-03-12 DIAGNOSIS — H43813 Vitreous degeneration, bilateral: Secondary | ICD-10-CM | POA: Diagnosis not present

## 2016-03-12 DIAGNOSIS — E113293 Type 2 diabetes mellitus with mild nonproliferative diabetic retinopathy without macular edema, bilateral: Secondary | ICD-10-CM | POA: Diagnosis not present

## 2016-03-31 ENCOUNTER — Ambulatory Visit (HOSPITAL_BASED_OUTPATIENT_CLINIC_OR_DEPARTMENT_OTHER): Payer: Medicare Other

## 2016-03-31 ENCOUNTER — Other Ambulatory Visit (HOSPITAL_BASED_OUTPATIENT_CLINIC_OR_DEPARTMENT_OTHER): Payer: Medicare Other

## 2016-03-31 VITALS — BP 145/45 | HR 54 | Temp 97.7°F | Resp 20

## 2016-03-31 DIAGNOSIS — N189 Chronic kidney disease, unspecified: Secondary | ICD-10-CM | POA: Diagnosis not present

## 2016-03-31 DIAGNOSIS — D631 Anemia in chronic kidney disease: Secondary | ICD-10-CM

## 2016-03-31 DIAGNOSIS — D509 Iron deficiency anemia, unspecified: Secondary | ICD-10-CM

## 2016-03-31 LAB — CBC & DIFF AND RETIC
BASO%: 0.6 % (ref 0.0–2.0)
Basophils Absolute: 0 10*3/uL (ref 0.0–0.1)
EOS ABS: 0.5 10*3/uL (ref 0.0–0.5)
EOS%: 6.6 % (ref 0.0–7.0)
HEMATOCRIT: 33 % — AB (ref 34.8–46.6)
HGB: 10.9 g/dL — ABNORMAL LOW (ref 11.6–15.9)
IMMATURE RETIC FRACT: 7.8 % (ref 1.60–10.00)
LYMPH%: 21.4 % (ref 14.0–49.7)
MCH: 29.5 pg (ref 25.1–34.0)
MCHC: 33 g/dL (ref 31.5–36.0)
MCV: 89.2 fL (ref 79.5–101.0)
MONO#: 0.6 10*3/uL (ref 0.1–0.9)
MONO%: 9.3 % (ref 0.0–14.0)
NEUT#: 4.3 10*3/uL (ref 1.5–6.5)
NEUT%: 62.1 % (ref 38.4–76.8)
PLATELETS: 146 10*3/uL (ref 145–400)
RBC: 3.7 10*6/uL (ref 3.70–5.45)
RDW: 14.4 % (ref 11.2–14.5)
RETIC CT ABS: 78.81 10*3/uL (ref 33.70–90.70)
Retic %: 2.13 % — ABNORMAL HIGH (ref 0.70–2.10)
WBC: 6.9 10*3/uL (ref 3.9–10.3)
lymph#: 1.5 10*3/uL (ref 0.9–3.3)
nRBC: 0 % (ref 0–0)

## 2016-03-31 MED ORDER — DARBEPOETIN ALFA 200 MCG/0.4ML IJ SOSY
200.0000 ug | PREFILLED_SYRINGE | Freq: Once | INTRAMUSCULAR | Status: AC
  Administered 2016-03-31: 200 ug via SUBCUTANEOUS
  Filled 2016-03-31: qty 0.4

## 2016-03-31 NOTE — Patient Instructions (Signed)
Darbepoetin Alfa injection What is this medicine? DARBEPOETIN ALFA (dar be POE e tin AL fa) helps your body make more red blood cells. It is used to treat anemia caused by chronic kidney failure and chemotherapy. This medicine may be used for other purposes; ask your health care provider or pharmacist if you have questions. What should I tell my health care provider before I take this medicine? They need to know if you have any of these conditions: -blood clotting disorders or history of blood clots -cancer patient not on chemotherapy -cystic fibrosis -heart disease, such as angina, heart failure, or a history of a heart attack -hemoglobin level of 12 g/dL or greater -high blood pressure -low levels of folate, iron, or vitamin B12 -seizures -an unusual or allergic reaction to darbepoetin, erythropoietin, albumin, hamster proteins, latex, other medicines, foods, dyes, or preservatives -pregnant or trying to get pregnant -breast-feeding How should I use this medicine? This medicine is for injection into a vein or under the skin. It is usually given by a health care professional in a hospital or clinic setting. If you get this medicine at home, you will be taught how to prepare and give this medicine. Do not shake the solution before you withdraw a dose. Use exactly as directed. Take your medicine at regular intervals. Do not take your medicine more often than directed. It is important that you put your used needles and syringes in a special sharps container. Do not put them in a trash can. If you do not have a sharps container, call your pharmacist or healthcare provider to get one. Talk to your pediatrician regarding the use of this medicine in children. While this medicine may be used in children as young as 1 year for selected conditions, precautions do apply. Overdosage: If you think you have taken too much of this medicine contact a poison control center or emergency room at once. NOTE:  This medicine is only for you. Do not share this medicine with others. What if I miss a dose? If you miss a dose, take it as soon as you can. If it is almost time for your next dose, take only that dose. Do not take double or extra doses. What may interact with this medicine? Do not take this medicine with any of the following medications: -epoetin alfa This list may not describe all possible interactions. Give your health care provider a list of all the medicines, herbs, non-prescription drugs, or dietary supplements you use. Also tell them if you smoke, drink alcohol, or use illegal drugs. Some items may interact with your medicine. What should I watch for while using this medicine? Visit your prescriber or health care professional for regular checks on your progress and for the needed blood tests and blood pressure measurements. It is especially important for the doctor to make sure your hemoglobin level is in the desired range, to limit the risk of potential side effects and to give you the best benefit. Keep all appointments for any recommended tests. Check your blood pressure as directed. Ask your doctor what your blood pressure should be and when you should contact him or her. As your body makes more red blood cells, you may need to take iron, folic acid, or vitamin B supplements. Ask your doctor or health care provider which products are right for you. If you have kidney disease continue dietary restrictions, even though this medication can make you feel better. Talk with your doctor or health care professional about the   foods you eat and the vitamins that you take. What side effects may I notice from receiving this medicine? Side effects that you should report to your doctor or health care professional as soon as possible: -allergic reactions like skin rash, itching or hives, swelling of the face, lips, or tongue -breathing problems -changes in vision -chest pain -confusion, trouble speaking  or understanding -feeling faint or lightheaded, falls -high blood pressure -muscle aches or pains -pain, swelling, warmth in the leg -rapid weight gain -severe headaches -sudden numbness or weakness of the face, arm or leg -trouble walking, dizziness, loss of balance or coordination -seizures (convulsions) -swelling of the ankles, feet, hands -unusually weak or tired Side effects that usually do not require medical attention (report to your doctor or health care professional if they continue or are bothersome): -diarrhea -fever, chills (flu-like symptoms) -headaches -nausea, vomiting -redness, stinging, or swelling at site where injected This list may not describe all possible side effects. Call your doctor for medical advice about side effects. You may report side effects to FDA at 1-800-FDA-1088. Where should I keep my medicine? Keep out of the reach of children. Store in a refrigerator between 2 and 8 degrees C (36 and 46 degrees F). Do not freeze. Do not shake. Throw away any unused portion if using a single-dose vial. Throw away any unused medicine after the expiration date. NOTE: This sheet is a summary. It may not cover all possible information. If you have questions about this medicine, talk to your doctor, pharmacist, or health care provider.    2016, Elsevier/[employer] Standard. (2008-08-27 10:23:57)  

## 2016-04-27 ENCOUNTER — Telehealth: Payer: Self-pay | Admitting: Family Medicine

## 2016-04-27 DIAGNOSIS — E7800 Pure hypercholesterolemia, unspecified: Secondary | ICD-10-CM

## 2016-04-27 DIAGNOSIS — E0840 Diabetes mellitus due to underlying condition with diabetic neuropathy, unspecified: Secondary | ICD-10-CM

## 2016-04-27 DIAGNOSIS — N189 Chronic kidney disease, unspecified: Principal | ICD-10-CM

## 2016-04-27 DIAGNOSIS — E559 Vitamin D deficiency, unspecified: Secondary | ICD-10-CM

## 2016-04-27 DIAGNOSIS — D631 Anemia in chronic kidney disease: Secondary | ICD-10-CM

## 2016-04-27 DIAGNOSIS — E038 Other specified hypothyroidism: Secondary | ICD-10-CM

## 2016-04-27 NOTE — Telephone Encounter (Signed)
-----   Message from Marchia Bond sent at 04/20/2016  1:31 PM EDT ----- Regarding: Cpx labs Wed 8/2, need orders. Thanks! :-) Please order  future cpx labs for pt's upcoming lab appt. Thanks Aniceto Boss

## 2016-04-28 ENCOUNTER — Other Ambulatory Visit (INDEPENDENT_AMBULATORY_CARE_PROVIDER_SITE_OTHER): Payer: Medicare Other

## 2016-04-28 ENCOUNTER — Ambulatory Visit (INDEPENDENT_AMBULATORY_CARE_PROVIDER_SITE_OTHER): Payer: Medicare Other

## 2016-04-28 VITALS — BP 138/80 | HR 67 | Temp 98.7°F | Ht 63.0 in | Wt 185.5 lb

## 2016-04-28 DIAGNOSIS — E7800 Pure hypercholesterolemia, unspecified: Secondary | ICD-10-CM | POA: Diagnosis not present

## 2016-04-28 DIAGNOSIS — E038 Other specified hypothyroidism: Secondary | ICD-10-CM

## 2016-04-28 DIAGNOSIS — E0840 Diabetes mellitus due to underlying condition with diabetic neuropathy, unspecified: Secondary | ICD-10-CM

## 2016-04-28 DIAGNOSIS — Z0000 Encounter for general adult medical examination without abnormal findings: Secondary | ICD-10-CM

## 2016-04-28 DIAGNOSIS — E559 Vitamin D deficiency, unspecified: Secondary | ICD-10-CM

## 2016-04-28 LAB — COMPREHENSIVE METABOLIC PANEL
ALBUMIN: 3.8 g/dL (ref 3.5–5.2)
ALK PHOS: 79 U/L (ref 39–117)
ALT: 12 U/L (ref 0–35)
AST: 14 U/L (ref 0–37)
BUN: 26 mg/dL — ABNORMAL HIGH (ref 6–23)
CALCIUM: 9.7 mg/dL (ref 8.4–10.5)
CHLORIDE: 103 meq/L (ref 96–112)
CO2: 24 mEq/L (ref 19–32)
Creatinine, Ser: 1.23 mg/dL — ABNORMAL HIGH (ref 0.40–1.20)
GFR: 44.54 mL/min — ABNORMAL LOW (ref >60.00)
Glucose, Bld: 136 mg/dL — ABNORMAL HIGH (ref 70–99)
POTASSIUM: 3.9 meq/L (ref 3.5–5.1)
Sodium: 138 mEq/L (ref 135–145)
TOTAL PROTEIN: 7 g/dL (ref 6.0–8.3)
Total Bilirubin: 0.5 mg/dL (ref 0.2–1.2)

## 2016-04-28 LAB — HEMOGLOBIN A1C: HEMOGLOBIN A1C: 6.4 % (ref 4.6–6.5)

## 2016-04-28 LAB — LIPID PANEL
CHOLESTEROL: 357 mg/dL — AB (ref 0–200)
HDL: 42.9 mg/dL (ref >39.00)
NonHDL: 314.16
Total CHOL/HDL Ratio: 8
Triglycerides: 340 mg/dL — ABNORMAL HIGH (ref 0.0–149.0)
VLDL: 68 mg/dL — AB (ref 0.0–40.0)

## 2016-04-28 LAB — T4, FREE: Free T4: 0.93 ng/dL (ref 0.60–1.60)

## 2016-04-28 LAB — T3, FREE: T3, Free: 3 pg/mL (ref 2.3–4.2)

## 2016-04-28 LAB — VITAMIN D 25 HYDROXY (VIT D DEFICIENCY, FRACTURES): VITD: 34.51 ng/mL (ref 30.00–100.00)

## 2016-04-28 LAB — LDL CHOLESTEROL, DIRECT: Direct LDL: 235 mg/dL

## 2016-04-28 LAB — TSH: TSH: 3.97 u[IU]/mL (ref 0.35–4.50)

## 2016-04-28 NOTE — Progress Notes (Signed)
PCP notes:   Health maintenance:  Flu vaccine - addressed Shingles - postponed Colonoscopy - pt stated she was told 10 yr frequency between screenings  Abnormal screenings:   Hearing - failed Mini-Cog: 18/20  Patient concerns:    None  Nurse concerns:  None  Next PCP appt:   05/06/16 @ 0900

## 2016-04-28 NOTE — Progress Notes (Signed)
Pre visit review using our clinic review tool, if applicable. No additional management support is needed unless otherwise documented below in the visit note. 

## 2016-04-28 NOTE — Progress Notes (Signed)
Subjective:   [name] is a 82 y.o. female who presents for Medicare Annual (Subsequent) preventive examination.  Review of Systems:  N/A Cardiac Risk Factors include: advanced age (>62men, >17 women);obesity (BMI >30kg/m2);diabetes mellitus;dyslipidemia;hypertension     Objective:     Vitals: BP 138/80 (BP Location: Left Arm, Patient Position: Sitting, Cuff Size: Normal)   Pulse 67   Temp 98.7 F (37.1 C) (Oral)   Ht 5\' 3"  (1.6 m) Comment: no shoes  Wt 185 lb 8 oz (84.1 kg)   SpO2 99%   BMI 32.86 kg/m   Body mass index is 32.86 kg/m.   Tobacco History  Smoking Status  . Never Smoker  Smokeless Tobacco  . Never Used     Counseling given: No   Past Medical History:  Diagnosis Date  . Breast cancer ([place])   . Cellulitis    R ARM  . Coronary artery disease   . History of breast cancer 10/02/2015  . History of uterine cancer 10/02/2015  . Hyperlipidemia   . Hypertension   . Hypothyroidism   . Uterine cancer [facility])    Past Surgical History:  Procedure Laterality Date  . APPENDECTOMY    . CARDIAC CATHETERIZATION    . CORONARY ARTERY BYPASS GRAFT  2008  . MASTECTOMY  1979   right, followed by 2 years chemo  . NUCLEAR STRESS TEST  07/2009   EF 69%,  MILD ISCHEMIA IN INFEROLATERAL WALL  . TOTAL ABDOMINAL HYSTERECTOMY  1993  . TOTAL HIP ARTHROPLASTY  08/2009   right  . US ECHOCARDIOGRAPHY  05/2009   EF 55-60%,MILD-MOD LVH,, MILD-MOD A STENOSIS,   Family History  Problem Relation Age of Onset  . Cancer Mother     ? stomach cancer  . Diabetes Mother   . Heart disease Father 52  . Diabetes Brother   . Throat cancer Brother    History  Sexual Activity  . Sexual activity: No    Comment: 5 children, 4 daughters and 1 son, retired, active in church    Outpatient Encounter Prescriptions as of 04/28/2016  Medication Sig  . aspirin 81 MG tablet Take 81 mg by mouth daily.    Marland Kitchen atorvastatin (LIPITOR) 80 MG tablet Take 1 tablet (80 mg total) by mouth daily.   . Blood Glucose Monitoring Suppl (ACCU-CHEK AVIVA) device Use to check blood sugar once daily.  Dx: E11.43  . cyanocobalamin 1000 MCG tablet Take 1,000 mcg by mouth daily. Reported on 09/11/2015  . Ferrous Sulfate (IRON) 325 (65 FE) MG TABS Take 1 tablet by mouth 2 (two) times daily. Reported on 09/11/2015  . gabapentin (NEURONTIN) 100 MG capsule Take 100 mg by mouth 3 (three) times daily. Reported on 09/11/2015  . glipiZIDE (GLUCOTROL XL) 5 MG 24 hr tablet Take 1 tablet (5 mg total) by mouth daily with breakfast.  . glucose blood (ACCU-CHEK AVIVA) test strip Use to check blood sugar once daily.  Dx: E11.43  . Lancet Devices (ACCU-CHEK SOFTCLIX) lancets Use to check blood sugar once daily.  Dx: E11.43  . Lancets (ACCU-CHEK SOFT TOUCH) lancets Use to check blood sugar once daily.  Dx: E11.43  . levothyroxine (SYNTHROID, LEVOTHROID) 50 MCG tablet TAKE 1 TABLET BY MOUTH EVERY DAY  . losartan (COZAAR) 50 MG tablet TAKE 1 TABLET (50 MG TOTAL) BY MOUTH DAILY.  . metoprolol tartrate (LOPRESSOR) 25 MG tablet Take 25 mg by mouth 2 (two) times daily.  . nitroGLYCERIN (NITROSTAT) 0.4 MG SL tablet Place 1 tablet (  0.4 mg total) under the tongue every 5 (five) minutes as needed for chest pain (x 3 tabs daily).  . pioglitazone (ACTOS) 30 MG tablet TAKE 0.5 TABLETS (15 MG TOTAL) BY MOUTH DAILY.  Marland Kitchen sertraline (ZOLOFT) 25 MG tablet TAKE 1 TABLET (25 MG TOTAL) BY MOUTH DAILY.  Marland Kitchen triamterene-hydrochlorothiazide (DYAZIDE) 37.5-25 MG capsule Take 1 capsule by mouth daily.  . Vitamin D, Ergocalciferol, (DRISDOL) 50000 units CAPS capsule TAKE 1 CAPSULE BY MOUTH EVERY 7 DAYS.   No facility-administered encounter medications on file as of 04/28/2016.     Activities of Daily Living In your present state of health, do you have any difficulty performing the following activities: 04/28/2016 09/17/2015  Hearing? N N  Vision? N N  Difficulty concentrating or making decisions? [name]  Walking or climbing stairs? Y Y  Dressing  or bathing? N N  Doing errands, shopping? N Y  Conservation officer, nature and eating ? N -  Using the Toilet? N -  In the past six months, have you accidently leaked urine? N -  Do you have problems with loss of bowel control? N -  Managing your Medications? N -  Managing your Finances? N -  Housekeeping or managing your Housekeeping? N -  Some recent data might be hidden    Patient Care Team: Jinny Sanders, MD as PCP - General (Family Medicine)    Assessment:     Hearing Screening   125Hz  250Hz  500Hz  1000Hz  2000Hz  3000Hz  4000Hz  6000Hz  8000Hz   Right ear:   40 0 40  40    Left ear:   0 0 40  40    Vision Screening Comments: Last vision exam in July 2017    Exercise Activities and Dietary recommendations Current Exercise Habits: The patient does not participate in regular exercise at present, Exercise limited by: None identified  Goals    . Increase physical activity          Starting 04/28/16, I will attempt to do at least 15 min of chair exercises daily.       Fall Risk Fall Risk  04/28/2016 12/04/2013  Falls in the past year? No Yes  Number falls in past yr: - 1  Injury with Fall? - No   Depression Screen PHQ 2/9 Scores 04/28/2016 01/21/2015 12/04/2013  PHQ - 2 Score 1 0 0     Cognitive Testing MMSE - Mini Mental State Exam 04/28/2016  Orientation to time 5  Orientation to Place 5  Registration 3  Attention/ Calculation 0  Recall 2  Recall-comments pt was unable to recall 1 of 3 words  Language- name 2 objects 0  Language- repeat 1  Language- follow 3 step command 2  Language- follow 3 step command-comments pt was unable to follow 1 step of 3 step command  Language- read & follow direction 0  Write a sentence 0  Copy design 0  Total score 18   PLEASE NOTE: A Mini-Cog screen was completed. Maximum score is 20. A value of 0 denotes this part of Folstein MMSE was not completed or the patient failed this part of the Mini-Cog screening.   Mini-Cog Screening Orientation to Time -  Max 5 pts Orientation to Place - Max 5 pts Registration - Max 3 pts Recall - Max 3 pts Language Repeat - Max 1 pts Language Follow 3 Step Command - Max 3 pts   Immunization History  Administered Date(s) Administered  . Influenza Split 08/03/2012  . Influenza,inj,Quad PF,36+ Mos 07/25/2015  .  Pneumococcal Conjugate-13 07/25/2015  . Pneumococcal-Unspecified 10/07/2011  . Td 10/07/2011   Screening Tests Health Maintenance  Topic Date Due  . INFLUENZA VACCINE  09/26/2016 (Originally 04/27/2016)  . ZOSTAVAX  04/28/2017 (Originally 05/05/1995)  . COLONOSCOPY  05/04/2018 (Originally 05/04/2013)  . OPHTHALMOLOGY EXAM  10/21/2016  . HEMOGLOBIN A1C  10/29/2016  . FOOT EXAM  01/22/2017  . TETANUS/TDAP  10/06/2021  . DEXA SCAN  Completed  . PNA vac Low Risk Adult  Completed      Plan:     I have personally reviewed and addressed the Medicare Annual Wellness questionnaire and have noted the following in the patient's chart:  A. Medical and social history B. Use of alcohol, tobacco or illicit drugs  C. Current medications and supplements D. Functional ability and status E.  Nutritional status F.  Physical activity G. Advance directives H. List of other physicians I.  Hospitalizations, surgeries, and ER visits in previous 12 months J.  [place] to include hearing, vision, cognitive, depression L. Referrals and appointments - none  In addition, I have reviewed and discussed with patient certain preventive protocols, quality metrics, and best practice recommendations. A written personalized care plan for preventive services as well as general preventive health recommendations were provided to patient.  See attached scanned questionnaire for additional information.   Signed,   Lindell Noe, MHA, BS, LPN Health Advisor

## 2016-04-28 NOTE — Patient Instructions (Signed)
Ms. Gleim , Thank you for taking time to come for your Medicare Wellness Visit. I appreciate your ongoing commitment to your health goals. Please review the following plan we discussed and let me know if I can assist you in the future.   These are the goals we discussed: Goals    . Increase physical activity          Starting 04/28/16, I will attempt to do at least 15 min of chair exercises daily.        This is a list of the screening recommended for you and due dates:  Health Maintenance  Topic Date Due  . Flu Shot  09/26/2016*  . Shingles Vaccine  04/28/2017*  . Colon Cancer Screening  05/04/2018*  . Eye exam for diabetics  10/21/2016  . Hemoglobin A1C  10/29/2016  . Complete foot exam   01/22/2017  . Tetanus Vaccine  10/06/2021  . DEXA scan (bone density measurement)  Completed  . Pneumonia vaccines  Completed  *Topic was postponed. The date shown is not the original due date.   Preventive Care for Adults  A healthy lifestyle and preventive care can promote health and wellness. Preventive health guidelines for adults include the following key practices.  . A routine yearly physical is a good way to check with your health care provider about your health and preventive screening. It is a chance to share any concerns and updates on your health and to receive a thorough exam.  . Visit your dentist for a routine exam and preventive care every 6 months. Brush your teeth twice a day and floss once a day. Good oral hygiene prevents tooth decay and gum disease.  . The frequency of eye exams is based on your age, health, family medical history, use  of contact lenses, and other factors. Follow your health care provider's ecommendations for frequency of eye exams.  . Eat a healthy diet. Foods like vegetables, fruits, whole grains, low-fat dairy products, and lean protein foods contain the nutrients you need without too many calories. Decrease your intake of foods high in solid fats,  added sugars, and salt. Eat the right amount of calories for you. Get information about a proper diet from your health care provider, if necessary.  . Regular physical exercise is one of the most important things you can do for your health. Most adults should get at least 150 minutes of moderate-intensity exercise (any activity that increases your heart rate and causes you to sweat) each week. In addition, most adults need muscle-strengthening exercises on 2 or more days a week.  Silver Sneakers may be a benefit available to you. To determine eligibility, you may visit the website: www.silversneakers.com or contact program at [phone] Mon-Fri between 8AM-8PM.   . Maintain a healthy weight. The body mass index (BMI) is a screening tool to identify possible weight problems. It provides an estimate of body fat based on height and weight. Your health care provider can find your BMI and can help you achieve or maintain a healthy weight.   For adults 20 years and older: ? A BMI below 18.5 is considered underweight. ? A BMI of 18.5 to 24.9 is normal. ? A BMI of 25 to 29.9 is considered overweight. ? A BMI of 30 and above is considered obese.   . Maintain normal blood lipids and cholesterol levels by exercising and minimizing your intake of saturated fat. Eat a balanced diet with plenty of fruit and vegetables. Blood tests for  lipids and cholesterol should begin at age 94 and be repeated every 5 years. If your lipid or cholesterol levels are high, you are over 50, or you are at high risk for heart disease, you may need your cholesterol levels checked more frequently. Ongoing high lipid and cholesterol levels should be treated with medicines if diet and exercise are not working.  . If you smoke, find out from your health care provider how to quit. If you do not use tobacco, please do not start.  . If you choose to drink alcohol, please do not consume more than 2 drinks per day. One drink is  considered to be 12 ounces (355 mL) of beer, 5 ounces (148 mL) of wine, or 1.5 ounces (44 mL) of liquor.  . If you are 19-23 years old, ask your health care provider if you should take aspirin to prevent strokes.  . Use sunscreen. Apply sunscreen liberally and repeatedly throughout the day. You should seek shade when your shadow is shorter than you. Protect yourself by wearing long sleeves, pants, a wide-brimmed hat, and sunglasses year round, whenever you are outdoors.  . Once a month, do a whole body skin exam, using a mirror to look at the skin on your back. Tell your health care provider of new moles, moles that have irregular borders, moles that are larger than a pencil eraser, or moles that have changed in shape or color.

## 2016-05-03 ENCOUNTER — Encounter: Payer: Self-pay | Admitting: Internal Medicine

## 2016-05-03 ENCOUNTER — Ambulatory Visit (INDEPENDENT_AMBULATORY_CARE_PROVIDER_SITE_OTHER): Payer: Medicare Other | Admitting: Internal Medicine

## 2016-05-03 VITALS — BP 142/82 | HR 99 | Temp 97.7°F | Wt 185.0 lb

## 2016-05-03 DIAGNOSIS — R5383 Other fatigue: Secondary | ICD-10-CM | POA: Diagnosis not present

## 2016-05-03 DIAGNOSIS — R110 Nausea: Secondary | ICD-10-CM | POA: Diagnosis not present

## 2016-05-03 MED ORDER — ONDANSETRON HCL 4 MG PO TABS: 4.0000 mg | ORAL_TABLET | Freq: Three times a day (TID) | ORAL | 0 refills | Status: DC | PRN

## 2016-05-03 NOTE — Progress Notes (Signed)
Subjective:    Patient ID: [name], female    DOB: [birth year], 82 y.o.   MRN: [id number]  HPI  Pt presents to the clinic today with a complaint of nausea x 4 days.  She reports the symptoms began after returning home from her grandaughter's play 4 nights ago.  She denies consuming new foods on the day of symptom onset.  She admits to 1 episode of loose stool 2 days ago with none since then, and decreased appetite.  She reports burping but states this is not new.  She denies fever, chills, vomiting, abdominal pain, bloody stools, constipation, heartburn, or indigestion.  She also complains of fatigue x 4 days and reports she has been sleeping more than usual.  She denies dizziness, palpitations and weight changes.  She has not tried anything for her symptoms.  She reports her daughter told her she had similar symptoms in December when she was dehydrated.  She states she usually drinks 2-3 cups of water/day.     Review of Systems Past Medical History:  Diagnosis Date  . Breast cancer ([place])   . Cellulitis    R ARM  . Coronary artery disease   . History of breast cancer 10/02/2015  . History of uterine cancer 10/02/2015  . Hyperlipidemia   . Hypertension   . Hypothyroidism   . Uterine cancer [facility])    Past Surgical History:  Procedure Laterality Date  . APPENDECTOMY    . CARDIAC CATHETERIZATION    . CORONARY ARTERY BYPASS GRAFT  2008  . MASTECTOMY  1979   right, followed by 2 years chemo  . NUCLEAR STRESS TEST  07/2009   EF 69%,  MILD ISCHEMIA IN INFEROLATERAL WALL  . TOTAL ABDOMINAL HYSTERECTOMY  1993  . TOTAL HIP ARTHROPLASTY  08/2009   right  . US ECHOCARDIOGRAPHY  05/2009   EF 55-60%,MILD-MOD LVH,, MILD-MOD A STENOSIS,   Family History  Problem Relation Age of Onset  . Cancer Mother     ? stomach cancer  . Diabetes Mother   . Heart disease Father 42  . Diabetes Brother   . Throat cancer Brother    Allergies  Allergen Reactions  . Levofloxacin Nausea And Vomiting    . Morphine And Related Other (See Comments)    BAD HEADACHE   Current Outpatient Prescriptions on File Prior to Visit  Medication Sig Dispense Refill  . aspirin 81 MG tablet Take 81 mg by mouth daily.      Marland Kitchen atorvastatin (LIPITOR) 80 MG tablet Take 1 tablet (80 mg total) by mouth daily. 30 tablet 1  . Blood Glucose Monitoring Suppl (ACCU-CHEK AVIVA) device Use to check blood sugar once daily.  Dx: E11.43 1 each 0  . cyanocobalamin 1000 MCG tablet Take 1,000 mcg by mouth daily. Reported on 09/11/2015    . Ferrous Sulfate (IRON) 325 (65 FE) MG TABS Take 1 tablet by mouth 2 (two) times daily. Reported on 09/11/2015    . gabapentin (NEURONTIN) 100 MG capsule Take 100 mg by mouth 3 (three) times daily. Reported on 09/11/2015    . glipiZIDE (GLUCOTROL XL) 5 MG 24 hr tablet Take 1 tablet (5 mg total) by mouth daily with breakfast. 30 tablet 11  . glucose blood (ACCU-CHEK AVIVA) test strip Use to check blood sugar once daily.  Dx: E11.43 100 each 3  . Lancet Devices (ACCU-CHEK SOFTCLIX) lancets Use to check blood sugar once daily.  Dx: E11.43 1 each 0  . Lancets (ACCU-CHEK SOFT  TOUCH) lancets Use to check blood sugar once daily.  Dx: E11.43 100 each 3  . levothyroxine (SYNTHROID, LEVOTHROID) 50 MCG tablet TAKE 1 TABLET BY MOUTH EVERY DAY 30 tablet 5  . losartan (COZAAR) 50 MG tablet TAKE 1 TABLET (50 MG TOTAL) BY MOUTH DAILY. 90 tablet 2  . metoprolol tartrate (LOPRESSOR) 25 MG tablet Take 25 mg by mouth 2 (two) times daily.    . nitroGLYCERIN (NITROSTAT) 0.4 MG SL tablet Place 1 tablet (0.4 mg total) under the tongue every 5 (five) minutes as needed for chest pain (x 3 tabs daily). 25 tablet 3  . pioglitazone (ACTOS) 30 MG tablet TAKE 0.5 TABLETS (15 MG TOTAL) BY MOUTH DAILY. 90 tablet 1  . sertraline (ZOLOFT) 25 MG tablet TAKE 1 TABLET (25 MG TOTAL) BY MOUTH DAILY. 30 tablet 5  . triamterene-hydrochlorothiazide (DYAZIDE) 37.5-25 MG capsule Take 1 capsule by mouth daily.  0  . Vitamin D,  Ergocalciferol, (DRISDOL) 50000 units CAPS capsule TAKE 1 CAPSULE BY MOUTH EVERY 7 DAYS. 4 capsule 2   No current facility-administered medications on file prior to visit.     Const: Pt reports fatigue. Denies fever, chills, or weight changes. GI: Pt reports nausea, decreased appetite, and 1 episode of loose stool.  Denies vomiting, abdominal pain, constipation, or bloody stools. GU: Denies urinary symptoms.     Objective:   Physical Exam  BP (!) 142/82   Pulse 99   Temp 97.7 F (36.5 C) (Oral)   Wt 185 lb (83.9 kg)   SpO2 98%   BMI 32.77 kg/m   General: Well-appearing, in no acute distress. Neck: No lymphadenopathy noted. Pulm: Clear to auscultation bilaterally.  No wheezes, rales, or rhonchi. CV: Regular rate and rhythm.  No murmurs, rubs, or gallops. GI: Positive bowel sounds in all four quadrants.  Tympanic on percussion throughout.  Soft, nontender to palpation.      Assessment & Plan:   Nausea, fatigue:  eRx for Zofran 4mg  q8hrs as needed for nausea Increase water intake If symptoms worsen or do not resolve, follow-up with Dr. Diona Browner during scheduled annual exam in 3 days  RTC as needed or if symptoms persist or worsen Arun Herrod, NP

## 2016-05-03 NOTE — Patient Instructions (Addendum)
Nausea, Adult Nausea is the feeling that you have an upset stomach or have to vomit. Nausea by itself is not likely a serious concern, but it may be an early sign of more serious medical problems. As nausea gets worse, it can lead to vomiting. If vomiting develops, there is the risk of dehydration.  CAUSES   Viral infections.  Food poisoning.  Medicines.  Pregnancy.  Motion sickness.  Migraine headaches.  Emotional distress.  Severe pain from any source.  Alcohol intoxication. HOME CARE INSTRUCTIONS  Get plenty of rest.  Ask your caregiver about specific rehydration instructions.  Eat small amounts of food and sip liquids more often.  Take all medicines as told by your caregiver. SEEK MEDICAL CARE IF:  You have not improved after 2 days, or you get worse.  You have a headache. SEEK IMMEDIATE MEDICAL CARE IF:   You have a fever.  You faint.  You keep vomiting or have blood in your vomit.  You are extremely weak or dehydrated.  You have dark or bloody stools.  You have severe chest or abdominal pain. MAKE SURE YOU:  Understand these instructions.  Will watch your condition.  Will get help right away if you are not doing well or get worse.   This information is not intended to replace advice given to you by your health care provider. Make sure you discuss any questions you have with your health care provider.   Document Released: 10/21/2004 Document Revised: 10/04/2014 Document Reviewed: 05/26/2011 Elsevier Interactive Patient Education 2016 Else[employer]  Nausea, Adult Nausea is the feeling that you have an upset stomach or have to vomit. Nausea by itself is not likely a serious concern, but it may be an early sign of more serious medical problems. As nausea gets worse, it can lead to vomiting. If vomiting develops, there is the risk of dehydration.  CAUSES   Viral infections.  Food poisoning.  Medicines.  Pregnancy.  Motion  sickness.  Migraine headaches.  Emotional distress.  Severe pain from any source.  Alcohol intoxication. HOME CARE INSTRUCTIONS  Get plenty of rest.  Ask your caregiver about specific rehydration instructions.  Eat small amounts of food and sip liquids more often.  Take all medicines as told by your caregiver. SEEK MEDICAL CARE IF:  You have not improved after 2 days, or you get worse.  You have a headache. SEEK IMMEDIATE MEDICAL CARE IF:   You have a fever.  You faint.  You keep vomiting or have blood in your vomit.  You are extremely weak or dehydrated.  You have dark or bloody stools.  You have severe chest or abdominal pain. MAKE SURE YOU:  Understand these instructions.  Will watch your condition.  Will get help right away if you are not doing well or get worse.   This information is not intended to replace advice given to you by your health care provider. Make sure you discuss any questions you have with your health care provider.   Document Released: 10/21/2004 Document Revised: 10/04/2014 Document Reviewed: 05/26/2011 Elsevier Interactive Patient Education [employer].

## 2016-05-04 NOTE — Progress Notes (Signed)
I reviewed health advisor's note, was available for consultation, and agree with documentation and plan.  Vicky Schleich, MD [facility] [place]  

## 2016-05-06 ENCOUNTER — Ambulatory Visit (INDEPENDENT_AMBULATORY_CARE_PROVIDER_SITE_OTHER): Payer: Medicare Other | Admitting: Family Medicine

## 2016-05-06 ENCOUNTER — Encounter: Payer: Self-pay | Admitting: Family Medicine

## 2016-05-06 DIAGNOSIS — I10 Essential (primary) hypertension: Secondary | ICD-10-CM

## 2016-05-06 DIAGNOSIS — E038 Other specified hypothyroidism: Secondary | ICD-10-CM

## 2016-05-06 DIAGNOSIS — N184 Chronic kidney disease, stage 4 (severe): Secondary | ICD-10-CM

## 2016-05-06 DIAGNOSIS — I6523 Occlusion and stenosis of bilateral carotid arteries: Secondary | ICD-10-CM

## 2016-05-06 DIAGNOSIS — E0840 Diabetes mellitus due to underlying condition with diabetic neuropathy, unspecified: Secondary | ICD-10-CM | POA: Diagnosis not present

## 2016-05-06 DIAGNOSIS — E785 Hyperlipidemia, unspecified: Secondary | ICD-10-CM

## 2016-05-06 LAB — HM DIABETES FOOT EXAM

## 2016-05-06 MED ORDER — ATORVASTATIN CALCIUM 40 MG PO TABS: 40.0000 mg | ORAL_TABLET | Freq: Every day | ORAL | 3 refills | Status: DC

## 2016-05-06 NOTE — Progress Notes (Signed)
Pre visit review using our clinic review tool, if applicable. No additional management support is needed unless otherwise documented below in the visit note. 

## 2016-05-06 NOTE — Assessment & Plan Note (Signed)
Well controlled. Continue current medication. Encouraged exercise, weight loss, healthy eating habits.  

## 2016-05-06 NOTE — Patient Instructions (Addendum)
Try to add back at least 3 times a week atorvastatin 40 mg.  Call if SE on lower dose.  Work  On low cholesterol diet and try to stay active.

## 2016-05-06 NOTE — Assessment & Plan Note (Signed)
stable 08/2015 dopplers.

## 2016-05-06 NOTE — Progress Notes (Signed)
Subjective:    Patient ID: [name], female    DOB: [birth year], 82 y.o.   MRN: [id number]  HPI   Earlier on 04/28/2016 she saw Candis Musa, LPN for medicare wellness. Note  reviewed in detail when completed. Flu vaccine - addressed Shingles - postponed Colonoscopy - pt stated she was told 10 yr frequency between screenings... NOT DUE AFTER AGE 44.  Abnormal screenings:  Hearing - failed Per pt on 05/06/16 no concern in this area issue Mini-Cog: 18/20, Per pt on 05/06/16 no concern in this area issue  Anemia of chronic disease now followed by Hemetology.. Pt on ESA inj. Hg 10.9 last check 03/31/2016  CKD nephrology seen 01/02/2016, felt secondary to DM and HTN..  Cr stable 1.23 baseline.  Diabetes: Well controlled on actos and glipizide in past. Lab Results  Component Value Date   HGBA1C 6.4 04/28/2016  Using medications without difficulties: Hypoglycemic episodes: none Hyperglycemic episodes:none Feet problems: none Blood Sugars averaging:90 eye exam within last year: retinopathy, done in last year.  Hypertension:  Goo control on metoprolol and diazide.  Seems to have white HTN at times. BP Readings from Last 3 Encounters:  05/06/16 132/78  05/03/16 (!) 142/82  04/28/16 138/80  Using medication without problems or lightheadedness: None Chest pain with exertion:None Edema:None Short of breath:None Average home BPs: 124-137/57-88 Other issues:  Elevated Cholesterol:  LDL not at goal < 70 ( carotid stenosis, CAD and DM) OFF atorvastatin 80 mg daily . She had stopped it in last 4 weeks for leg pain.. Now pain is resolved but chol very  poorly controlled. Lab Results  Component Value Date   CHOL 357 (H) 04/28/2016   HDL 42.90 04/28/2016   LDLCALC 140 (H) 09/16/2015   LDLDIRECT 235.0 04/28/2016   TRIG 340.0 (H) 04/28/2016   CHOLHDL 8 04/28/2016  Using medications without problems: Muscle aches:  Diet compliance: Good. Exercise: None Other  complaints: CAD, carotid stenosis: followed by Dr. Mare Ferrari. Reviewed OV 10/2015  changing to Dr. Thana Farr. CS: stable 08/2015 dopplers.  Hypothyroidism stable Lab Results  Component Value Date   TSH 3.97 04/28/2016     Social History /Family History/Past Medical History reviewed and updated if needed.    Review of Systems  Constitutional: Negative for fatigue and fever.  HENT: Negative for congestion.   Eyes: Negative for pain.  Respiratory: Negative for cough and shortness of breath.   Cardiovascular: Negative for chest pain, palpitations and leg swelling.  Gastrointestinal: Negative for abdominal pain.  Genitourinary: Negative for dysuria and vaginal bleeding.  Musculoskeletal: Negative for back pain.  Neurological: Negative for syncope, light-headedness and headaches.  Psychiatric/Behavioral: Negative for dysphoric mood.       Objective:   Physical Exam Constitutional: Vital signs are normal. She appears well-developed and well-nourished. She is cooperative. Non-toxic appearance. She does not appear ill. No distress.  In NAD  HENT:  Head: Normocephalic.  Right Ear: Hearing, tympanic membrane, external ear and ear canal normal. Tympanic membrane is not erythematous, not retracted and not bulging.  Left Ear: Hearing, tympanic membrane, external ear and ear canal normal. Tympanic membrane is not erythematous, not retracted and not bulging.  Nose: No mucosal edema or rhinorrhea. Right sinus exhibits no maxillary sinus tenderness and no frontal sinus tenderness. Left sinus exhibits no maxillary sinus tenderness and no frontal sinus tenderness.  Mouth/Throat: Uvula is midline, oropharynx is clear and moist and mucous membranes are normal.  Eyes: Conjunctivae, EOM and lids are normal. Pupils are equal, round,  and reactive to light. Lids are everted and swept, no foreign bodies found.  Neck: Trachea normal and normal range of motion. Neck supple. Carotid bruit is not present.  No thyroid mass and no thyromegaly present.  Cardiovascular: Normal rate, regular rhythm, S1 normal, S2 normal, normal heart sounds, intact distal pulses and normal pulses. Exam reveals no gallop and no friction rub.  No murmur heard. Pulmonary/Chest: Effort normal and breath sounds normal. No tachypnea. No respiratory distress. She has no decreased breath sounds. She has no wheezes. She has no rhonchi. She has no rales.  Abdominal: Soft. Normal appearance and bowel sounds are normal. There is no tenderness.  Neurological: She is alert.  Skin: Skin is warm, dry and intact. No rash noted.  Psychiatric: Her speech is normal and behavior is normal. Judgment and thought content normal. Her mood appears not anxious. Cognition and memory are normal. She does not exhibit a depressed mood.   Diabetic foot exam: Normal inspection No skin breakdown No calluses  Normal DP pulses Normal sensation to light touch and monofilament Nails normal         Assessment & Plan:

## 2016-05-06 NOTE — Assessment & Plan Note (Signed)
Not at goal < 70 off atorvstatin.  Will have her try tlower dose.

## 2016-05-06 NOTE — Assessment & Plan Note (Signed)
Well controlled. Continue current medication.  

## 2016-05-06 NOTE — Assessment & Plan Note (Signed)
Stable on current dose.   

## 2016-05-06 NOTE — Assessment & Plan Note (Signed)
Secondary to DM and HTN. Followed by nephrology.

## 2016-05-10 ENCOUNTER — Other Ambulatory Visit: Payer: Self-pay | Admitting: Family Medicine

## 2016-05-12 ENCOUNTER — Ambulatory Visit (INDEPENDENT_AMBULATORY_CARE_PROVIDER_SITE_OTHER): Payer: Medicare Other | Admitting: Cardiovascular Disease

## 2016-05-12 ENCOUNTER — Encounter: Payer: Self-pay | Admitting: Cardiovascular Disease

## 2016-05-12 VITALS — BP 134/58 | HR 62 | Ht 65.0 in | Wt 186.0 lb

## 2016-05-12 DIAGNOSIS — I10 Essential (primary) hypertension: Secondary | ICD-10-CM | POA: Diagnosis not present

## 2016-05-12 DIAGNOSIS — I2510 Atherosclerotic heart disease of native coronary artery without angina pectoris: Secondary | ICD-10-CM | POA: Diagnosis not present

## 2016-05-12 DIAGNOSIS — N184 Chronic kidney disease, stage 4 (severe): Secondary | ICD-10-CM

## 2016-05-12 DIAGNOSIS — E785 Hyperlipidemia, unspecified: Secondary | ICD-10-CM

## 2016-05-12 DIAGNOSIS — I4510 Unspecified right bundle-branch block: Secondary | ICD-10-CM

## 2016-05-12 DIAGNOSIS — I2583 Coronary atherosclerosis due to lipid rich plaque: Principal | ICD-10-CM

## 2016-05-12 DIAGNOSIS — I6523 Occlusion and stenosis of bilateral carotid arteries: Secondary | ICD-10-CM

## 2016-05-12 DIAGNOSIS — E0840 Diabetes mellitus due to underlying condition with diabetic neuropathy, unspecified: Secondary | ICD-10-CM

## 2016-05-12 NOTE — Patient Instructions (Signed)
Dr Croitoru recommends that you schedule a follow-up appointment in 6 months. You will receive a reminder letter in the mail two months in advance. If you don't receive a letter, please call our office to schedule the follow-up appointment.  If you need a refill on your cardiac medications before your next appointment, please call your pharmacy. 

## 2016-05-12 NOTE — Progress Notes (Signed)
Cardiology Office Note    Date:  05/12/2016   ID:  [name], DOB 12-04-1932, MRN [id number]  PCP:  Eliezer Lofts, MD  Cardiologist:   Sanda Klein, MD   chief complaint: Establish new cardiology follow-up   History of Present Illness:  [name] is a 82 y.o. female former patient of Dr. Warren Danes with a history of coronary artery disease status post coronary bypass surgery, severe mixed hyperlipidemia, recently diagnosed right bundle branch block and first-degree AV block, hypertension, type 2 diabetes mellitus with neuropathy, stage III-IV chronic kidney disease, erythropoietin deficiency related anemia. Her last appointment with Dr. Mare Ferrari was in February and she has not had any serious new health issues since that time.  She lives independently and continues to drive and take care of her own housework. She denies exertional dyspnea or angina. She has not had syncope, dizziness, palpitations or any focal neurological complaints. She denies leg edema or intermittent claudication. She has not had any new focal neurological symptoms. She continues to receive periodic erythropoietin injections for anemia that is felt to be related to renal insufficiency (Dr. Alvy Bimler).  Stop taking her atorvastatin due to complaints with leg muscle aching. Repeat lipid profile performed on August 2 shows severe hypercholesterolemia, moderate hypertriglyceridemia and severely elevated LDL cholesterol. She discussed her options with her primary care physician and she is now taking atorvastatin every other day. Repeat labs are scheduled in a few months.  She has a history of ischemic heart disease. She underwent coronary artery bypass graft surgery following an acute myocardial infarction and surgery was done on 12/22/06. She has not had any subsequent angina pectoris. She does have exertional dyspnea.  Her most recent echocardiogram on 09/17/15 showed an ejection fraction of 60-65% and grade 2  diastolic dysfunction.  There was aortic valve sclerosis without significant stenosis and there was mitral annular calcification.. She had a LexiScan Myoview stress test on 01/25/12 which showed a small inferior wall scar and no ischemia and her ejection fraction was 72%. Carotid duplex ultrasound in December2016 showed 60-79% right, 40-59% left internal carotid artery stenosis    Past Medical History:  Diagnosis Date  . Breast cancer ([place])   . Cellulitis    R ARM  . Coronary artery disease   . History of breast cancer 10/02/2015  . History of uterine cancer 10/02/2015  . Hyperlipidemia   . Hypertension   . Hypothyroidism   . Uterine cancer [facility])     Past Surgical History:  Procedure Laterality Date  . APPENDECTOMY    . CARDIAC CATHETERIZATION    . CORONARY ARTERY BYPASS GRAFT  2008  . MASTECTOMY  1979   right, followed by 2 years chemo  . NUCLEAR STRESS TEST  07/2009   EF 69%,  MILD ISCHEMIA IN INFEROLATERAL WALL  . TOTAL ABDOMINAL HYSTERECTOMY  1993  . TOTAL HIP ARTHROPLASTY  08/2009   right  . US ECHOCARDIOGRAPHY  05/2009   EF 55-60%,MILD-MOD LVH,, MILD-MOD A STENOSIS,    Current Medications: Outpatient Medications Prior to Visit  Medication Sig Dispense Refill  . aspirin 81 MG tablet Take 81 mg by mouth daily.      Marland Kitchen atorvastatin (LIPITOR) 40 MG tablet Take 1 tablet (40 mg total) by mouth daily. 90 tablet 3  . Blood Glucose Monitoring Suppl (ACCU-CHEK AVIVA) device Use to check blood sugar once daily.  Dx: E11.43 1 each 0  . cyanocobalamin 1000 MCG tablet Take 1,000 mcg by mouth daily. Reported on  09/11/2015    . Ferrous Sulfate (IRON) 325 (65 FE) MG TABS Take 1 tablet by mouth 2 (two) times daily. Reported on 09/11/2015    . gabapentin (NEURONTIN) 100 MG capsule Take 100 mg by mouth 3 (three) times daily. Reported on 09/11/2015    . gabapentin (NEURONTIN) 100 MG capsule TAKE ONE CAPSULE 3 TIMES A DAY 90 capsule 3  . glipiZIDE (GLUCOTROL XL) 5 MG 24 hr tablet Take 1 tablet  (5 mg total) by mouth daily with breakfast. 30 tablet 11  . glucose blood (ACCU-CHEK AVIVA) test strip Use to check blood sugar once daily.  Dx: E11.43 100 each 3  . Lancet Devices (ACCU-CHEK SOFTCLIX) lancets Use to check blood sugar once daily.  Dx: E11.43 1 each 0  . Lancets (ACCU-CHEK SOFT TOUCH) lancets Use to check blood sugar once daily.  Dx: E11.43 100 each 3  . levothyroxine (SYNTHROID, LEVOTHROID) 50 MCG tablet TAKE 1 TABLET BY MOUTH EVERY DAY 30 tablet 5  . losartan (COZAAR) 50 MG tablet TAKE 1 TABLET (50 MG TOTAL) BY MOUTH DAILY. 90 tablet 2  . metoprolol tartrate (LOPRESSOR) 25 MG tablet Take 25 mg by mouth 2 (two) times daily.    . nitroGLYCERIN (NITROSTAT) 0.4 MG SL tablet Place 1 tablet (0.4 mg total) under the tongue every 5 (five) minutes as needed for chest pain (x 3 tabs daily). 25 tablet 3  . ondansetron (ZOFRAN) 4 MG tablet Take 1 tablet (4 mg total) by mouth every 8 (eight) hours as needed. 20 tablet 0  . pioglitazone (ACTOS) 30 MG tablet TAKE 0.5 TABLETS (15 MG TOTAL) BY MOUTH DAILY. 90 tablet 1  . sertraline (ZOLOFT) 25 MG tablet TAKE 1 TABLET (25 MG TOTAL) BY MOUTH DAILY. 30 tablet 5  . triamterene-hydrochlorothiazide (DYAZIDE) 37.5-25 MG capsule Take 1 capsule by mouth daily.  0  . Vitamin D, Ergocalciferol, (DRISDOL) 50000 units CAPS capsule TAKE 1 CAPSULE BY MOUTH EVERY 7 DAYS. 4 capsule 2   No facility-administered medications prior to visit.      Allergies:   Levofloxacin and Morphine and related   Social History   Social History  . Marital status: Divorced    Spouse name: N/A  . Number of children: N/A  . Years of education: N/A   Social History Main Topics  . Smoking status: Never Smoker  . Smokeless tobacco: Never Used  . Alcohol use No  . Drug use: No  . Sexual activity: No     Comment: 5 children, 4 daughters and 1 son, retired, active in church   Other Topics Concern  . None   Social History Narrative  . None     Family History:  The  patient's family history includes Cancer in her mother; Diabetes in her brother and mother; Heart disease (age of onset: 58) in her father; Throat cancer in her brother.   ROS:   Please see the history of present illness.    ROS All other systems reviewed and are negative.   PHYSICAL EXAM:   VS:  BP (!) 134/58   Pulse 62   Ht 5\' 5"  (1.651 m)   Wt 186 lb (84.4 kg)   BMI 30.95 kg/m    GEN: Well nourished, well developed, in no acute distress  HEENT: normal  Neck: no JVD, carotid bruits, or masses Cardiac: Widely split second heart sound, RRR; grade 1/6 early peaking systolic ejection murmur, no diastolic murmurs, rubs, or gallops,no edema  Respiratory:  clear to auscultation bilaterally,  normal work of breathing GI: soft, nontender, nondistended, + BS MS: no deformity or atrophy  Skin: warm and dry, no rash Neuro:  Alert and Oriented x 3, Strength and sensation are intact Psych: euthymic mood, full affect  Wt Readings from Last 3 Encounters:  05/12/16 186 lb (84.4 kg)  05/06/16 185 lb (83.9 kg)  05/03/16 185 lb (83.9 kg)      Studies/Labs Reviewed:   EKG:  EKG is ordered today.  The ekg ordered today demonstrates Sinus rhythm with first-degree AV block (PR interval 234 ms) right bundle branch block (QRS 126 ms), QTC 448 ms, minor nonspecific precordial ST segment changes.  Recent Labs: 09/16/2015: B Natriuretic Peptide 572.6 09/17/2015: Magnesium 2.4 03/31/2016: HGB 10.9; Platelets 146 04/28/2016: ALT 12; BUN 26; Creatinine, Ser 1.23; Potassium 3.9; Sodium 138; TSH 3.97   Lipid Panel    Component Value Date/Time   CHOL 357 (H) 04/28/2016 1044   TRIG 340.0 (H) 04/28/2016 1044   HDL 42.90 04/28/2016 1044   CHOLHDL 8 04/28/2016 1044   VLDL 68.0 (H) 04/28/2016 1044   LDLCALC 140 (H) 09/16/2015 0412   LDLDIRECT 235.0 04/28/2016 1044    Additional studies/ records that were reviewed today include:  Notes from Dr. Diona Browner, Dr. Alvy Bimler and Dr. Mare Ferrari  ASSESSMENT:    1.  Coronary artery disease due to lipid rich plaque   2. Essential hypertension   3. Right bundle branch block   4. Hyperlipidemia LDL goal <70   5. Diabetes mellitus due to underlying condition with diabetic neuropathy, without long-term current use of insulin ([place])   6. Chronic kidney disease, stage 4, severely decreased GFR (HCC)   7. Carotid stenosis, bilateral      PLAN:  In order of problems listed above:  1. CAD s/p CABG 2008: Her presentation symptoms when she had her myocardial infarction were very atypical (anorexia, fatigue). It may be difficult to uncover symptoms of recurrent ischemia since she never had typical angina. Keep a high index of suspicion for recurrent ischemia now that she is 10 years after surgery. 2. HTN: Excellent blood pressure control 3. RBBB: Also has first-degree AV block suggestive of a more extensive conduction system disease, but no symptoms or signs of high-grade AV block. Asked her to report presyncope or syncope promptly. A rather low dose of beta blocker. I don't think this needs to be curtailed at this time. 4. HLP: She has severe mixed hyperlipidemia and definitely needs cholesterol-lowering medication. I'm very doubtful that every other day dosing of atorvastatin will get as close to her goal of LDL under 70 mg/deciliter. Hopefully she will tolerate this. Another option will be intermittent dose rosuvastatin. I think in view of the severity of her hyperlipidemia it is quite reasonable to consider PCS K9 inhibitors 5. DM: Lipid profile is unfavorable with elevated triglycerides even in the setting of well-controlled glycemia 6. CKD: Most recent labs that she should improve renal function. Does have risk for potent deficiency and needs periodic injections for anemia. 7. Bilateral carotid stenosis: 60-79% stenosis on the right and 40-59% stenosis of the left internal carotid artery. Repeat at her next appointment.    Medication Adjustments/Labs and Tests  Ordered: Current medicines are reviewed at length with the patient today.  Concerns regarding medicines are outlined above.  Medication changes, Labs and Tests ordered today are listed in the Patient Instructions below. Patient Instructions  Dr Sallyanne Kuster recommends that you schedule a follow-up appointment in 6 months. You will receive a reminder letter  in the mail two months in advance. If you don't receive a letter, please call our office to schedule the follow-up appointment.  If you need a refill on your cardiac medications before your next appointment, please call your pharmacy.    Signed, Sanda Klein, MD  05/12/2016 1:35 PM    [facility] [place], [place], [place]  [address] Phone: [phone]; Fax: [phone]

## 2016-05-14 ENCOUNTER — Encounter (HOSPITAL_COMMUNITY): Payer: Self-pay | Admitting: Emergency Medicine

## 2016-05-14 ENCOUNTER — Observation Stay (HOSPITAL_COMMUNITY)
Admission: EM | Admit: 2016-05-14 | Discharge: 2016-05-17 | Disposition: A | Discharge status: Skilled Nursing Facility | Payer: Medicare Other | Attending: Internal Medicine | Admitting: Internal Medicine

## 2016-05-14 ENCOUNTER — Emergency Department (HOSPITAL_COMMUNITY): Payer: Medicare Other

## 2016-05-14 DIAGNOSIS — Z951 Presence of aortocoronary bypass graft: Secondary | ICD-10-CM | POA: Insufficient documentation

## 2016-05-14 DIAGNOSIS — Z853 Personal history of malignant neoplasm of breast: Secondary | ICD-10-CM | POA: Diagnosis not present

## 2016-05-14 DIAGNOSIS — D649 Anemia, unspecified: Secondary | ICD-10-CM | POA: Diagnosis not present

## 2016-05-14 DIAGNOSIS — R531 Weakness: Secondary | ICD-10-CM | POA: Diagnosis not present

## 2016-05-14 DIAGNOSIS — D631 Anemia in chronic kidney disease: Secondary | ICD-10-CM | POA: Diagnosis not present

## 2016-05-14 DIAGNOSIS — E1149 Type 2 diabetes mellitus with other diabetic neurological complication: Secondary | ICD-10-CM | POA: Diagnosis present

## 2016-05-14 DIAGNOSIS — Z79899 Other long term (current) drug therapy: Secondary | ICD-10-CM | POA: Insufficient documentation

## 2016-05-14 DIAGNOSIS — Z9011 Acquired absence of right breast and nipple: Secondary | ICD-10-CM | POA: Insufficient documentation

## 2016-05-14 DIAGNOSIS — S72009A Fracture of unspecified part of neck of unspecified femur, initial encounter for closed fracture: Secondary | ICD-10-CM

## 2016-05-14 DIAGNOSIS — W08XXXA Fall from other furniture, initial encounter: Secondary | ICD-10-CM | POA: Diagnosis not present

## 2016-05-14 DIAGNOSIS — I2510 Atherosclerotic heart disease of native coronary artery without angina pectoris: Secondary | ICD-10-CM | POA: Insufficient documentation

## 2016-05-14 DIAGNOSIS — N184 Chronic kidney disease, stage 4 (severe): Secondary | ICD-10-CM | POA: Diagnosis present

## 2016-05-14 DIAGNOSIS — N179 Acute kidney failure, unspecified: Secondary | ICD-10-CM | POA: Diagnosis present

## 2016-05-14 DIAGNOSIS — N183 Chronic kidney disease, stage 3 unspecified: Secondary | ICD-10-CM | POA: Diagnosis present

## 2016-05-14 DIAGNOSIS — M9701XA Periprosthetic fracture around internal prosthetic right hip joint, initial encounter: Principal | ICD-10-CM | POA: Insufficient documentation

## 2016-05-14 DIAGNOSIS — N189 Chronic kidney disease, unspecified: Secondary | ICD-10-CM

## 2016-05-14 DIAGNOSIS — E1143 Type 2 diabetes mellitus with diabetic autonomic (poly)neuropathy: Secondary | ICD-10-CM | POA: Diagnosis not present

## 2016-05-14 DIAGNOSIS — E1122 Type 2 diabetes mellitus with diabetic chronic kidney disease: Secondary | ICD-10-CM | POA: Insufficient documentation

## 2016-05-14 DIAGNOSIS — E785 Hyperlipidemia, unspecified: Secondary | ICD-10-CM | POA: Diagnosis not present

## 2016-05-14 DIAGNOSIS — E039 Hypothyroidism, unspecified: Secondary | ICD-10-CM | POA: Diagnosis not present

## 2016-05-14 DIAGNOSIS — I129 Hypertensive chronic kidney disease with stage 1 through stage 4 chronic kidney disease, or unspecified chronic kidney disease: Secondary | ICD-10-CM | POA: Diagnosis not present

## 2016-05-14 DIAGNOSIS — Z96641 Presence of right artificial hip joint: Secondary | ICD-10-CM | POA: Insufficient documentation

## 2016-05-14 DIAGNOSIS — E0840 Diabetes mellitus due to underlying condition with diabetic neuropathy, unspecified: Secondary | ICD-10-CM

## 2016-05-14 DIAGNOSIS — Z90710 Acquired absence of both cervix and uterus: Secondary | ICD-10-CM | POA: Diagnosis not present

## 2016-05-14 DIAGNOSIS — T148 Other injury of unspecified body region: Secondary | ICD-10-CM | POA: Diagnosis not present

## 2016-05-14 DIAGNOSIS — R262 Difficulty in walking, not elsewhere classified: Secondary | ICD-10-CM | POA: Diagnosis not present

## 2016-05-14 DIAGNOSIS — Z8781 Personal history of (healed) traumatic fracture: Secondary | ICD-10-CM | POA: Diagnosis present

## 2016-05-14 DIAGNOSIS — Z7982 Long term (current) use of aspirin: Secondary | ICD-10-CM | POA: Diagnosis not present

## 2016-05-14 DIAGNOSIS — Z8542 Personal history of malignant neoplasm of other parts of uterus: Secondary | ICD-10-CM | POA: Insufficient documentation

## 2016-05-14 DIAGNOSIS — I252 Old myocardial infarction: Secondary | ICD-10-CM | POA: Insufficient documentation

## 2016-05-14 DIAGNOSIS — W19XXXA Unspecified fall, initial encounter: Secondary | ICD-10-CM

## 2016-05-14 DIAGNOSIS — W010XXA Fall on same level from slipping, tripping and stumbling without subsequent striking against object, initial encounter: Secondary | ICD-10-CM | POA: Diagnosis not present

## 2016-05-14 DIAGNOSIS — S72001A Fracture of unspecified part of neck of right femur, initial encounter for closed fracture: Secondary | ICD-10-CM | POA: Diagnosis not present

## 2016-05-14 DIAGNOSIS — M25551 Pain in right hip: Secondary | ICD-10-CM | POA: Diagnosis not present

## 2016-05-14 DIAGNOSIS — S79911A Unspecified injury of right hip, initial encounter: Secondary | ICD-10-CM | POA: Diagnosis not present

## 2016-05-14 HISTORY — DX: Type 2 diabetes mellitus without complications: E11.9

## 2016-05-14 LAB — I-STAT CHEM 8, ED
BUN: 31 mg/dL — ABNORMAL HIGH (ref 6–20)
CHLORIDE: 101 mmol/L (ref 101–111)
Calcium, Ion: 1.18 mmol/L (ref 1.12–1.23)
Creatinine, Ser: 1.6 mg/dL — ABNORMAL HIGH (ref 0.44–1.00)
Glucose, Bld: 285 mg/dL — ABNORMAL HIGH (ref 65–99)
HCT: 35 % — ABNORMAL LOW (ref 36.0–46.0)
Hemoglobin: 11.9 g/dL — ABNORMAL LOW (ref 12.0–15.0)
POTASSIUM: 4.2 mmol/L (ref 3.5–5.1)
SODIUM: 138 mmol/L (ref 135–145)
TCO2: 24 mmol/L (ref 0–100)

## 2016-05-14 MED ORDER — METOPROLOL SUCCINATE ER 25 MG PO TB24
25.0000 mg | ORAL_TABLET | Freq: Two times a day (BID) | ORAL | Status: DC
  Administered 2016-05-15 – 2016-05-16 (×5): 25 mg via ORAL
  Filled 2016-05-14 (×5): qty 1

## 2016-05-14 MED ORDER — HYDROMORPHONE HCL 1 MG/ML IJ SOLN: 0.5000 mg | INTRAMUSCULAR | Status: DC | PRN

## 2016-05-14 MED ORDER — ACETAMINOPHEN 325 MG PO TABS
650.0000 mg | ORAL_TABLET | Freq: Once | ORAL | Status: AC
  Administered 2016-05-14: 650 mg via ORAL
  Filled 2016-05-14: qty 2

## 2016-05-14 MED ORDER — ATORVASTATIN CALCIUM 80 MG PO TABS
80.0000 mg | ORAL_TABLET | Freq: Every day | ORAL | Status: DC
  Administered 2016-05-15 – 2016-05-17 (×3): 80 mg via ORAL
  Filled 2016-05-14 (×3): qty 1

## 2016-05-14 MED ORDER — LEVOTHYROXINE SODIUM 50 MCG PO TABS
50.0000 ug | ORAL_TABLET | Freq: Every day | ORAL | Status: DC
  Administered 2016-05-15 – 2016-05-17 (×3): 50 ug via ORAL
  Filled 2016-05-14 (×3): qty 1

## 2016-05-14 MED ORDER — FERROUS SULFATE 325 (65 FE) MG PO TABS
325.0000 mg | ORAL_TABLET | Freq: Two times a day (BID) | ORAL | Status: DC
Start: 2016-05-15 — End: 2016-05-17
  Administered 2016-05-15 – 2016-05-17 (×6): 325 mg via ORAL
  Filled 2016-05-14 (×6): qty 1

## 2016-05-14 MED ORDER — INSULIN ASPART 100 UNIT/ML ~~LOC~~ SOLN
0.0000 [IU] | Freq: Three times a day (TID) | SUBCUTANEOUS | Status: DC
  Administered 2016-05-15: 3 [IU] via SUBCUTANEOUS
  Administered 2016-05-16: 5 [IU] via SUBCUTANEOUS
  Administered 2016-05-16: 3 [IU] via SUBCUTANEOUS
  Administered 2016-05-16: 0 [IU] via SUBCUTANEOUS
  Administered 2016-05-17 (×2): 3 [IU] via SUBCUTANEOUS

## 2016-05-14 MED ORDER — SERTRALINE HCL 25 MG PO TABS
25.0000 mg | ORAL_TABLET | Freq: Every day | ORAL | Status: DC
  Administered 2016-05-15 – 2016-05-17 (×3): 25 mg via ORAL
  Filled 2016-05-14 (×3): qty 1

## 2016-05-14 MED ORDER — SODIUM CHLORIDE 0.9 % IV BOLUS (SEPSIS)
1000.0000 mL | Freq: Once | INTRAVENOUS | Status: AC
  Administered 2016-05-14: 1000 mL via INTRAVENOUS

## 2016-05-14 MED ORDER — ASPIRIN 325 MG PO TABS: 325.0000 mg | ORAL_TABLET | Freq: Two times a day (BID) | ORAL | Status: DC

## 2016-05-14 MED ORDER — ASPIRIN 325 MG PO TABS
325.0000 mg | ORAL_TABLET | Freq: Every day | ORAL | Status: DC
  Administered 2016-05-15 – 2016-05-16 (×2): 325 mg via ORAL
  Filled 2016-05-14 (×3): qty 1

## 2016-05-14 MED ORDER — SENNOSIDES-DOCUSATE SODIUM 8.6-50 MG PO TABS: 1.0000 | ORAL_TABLET | Freq: Every evening | ORAL | Status: DC | PRN

## 2016-05-14 MED ORDER — GABAPENTIN 100 MG PO CAPS
100.0000 mg | ORAL_CAPSULE | Freq: Three times a day (TID) | ORAL | Status: DC
  Administered 2016-05-15 – 2016-05-17 (×9): 100 mg via ORAL
  Filled 2016-05-14 (×9): qty 1

## 2016-05-14 MED ORDER — ENOXAPARIN SODIUM 30 MG/0.3ML ~~LOC~~ SOLN
30.0000 mg | Freq: Every day | SUBCUTANEOUS | Status: DC
  Administered 2016-05-15 – 2016-05-17 (×3): 30 mg via SUBCUTANEOUS
  Filled 2016-05-14 (×3): qty 0.3

## 2016-05-14 MED ORDER — HYDROCODONE-ACETAMINOPHEN 5-325 MG PO TABS
1.0000 | ORAL_TABLET | Freq: Four times a day (QID) | ORAL | Status: DC | PRN
  Administered 2016-05-15 – 2016-05-16 (×3): 2 via ORAL
  Administered 2016-05-17: 1 via ORAL
  Filled 2016-05-14 (×3): qty 2
  Filled 2016-05-14 (×2): qty 1

## 2016-05-14 NOTE — ED Provider Notes (Signed)
[facility] DEPT Provider Note   CSN: [id number] Arrival date & time: 05/14/16  1600     History   Chief Complaint Chief Complaint  Patient presents with  . Loss of Consciousness  . Fall    HPI [name] is a 82 y.o. female.  HPI Patient presents for possible syncopal episode. She was outside about an hour PTA when she describes going to sit down on a concrete bench.  She states that the benches are staggered, with gaps in between, and she accidentally sat down where there was no bench.  So, she fell onto the concrete, landing on her buttocks.  She denies LOC, hitting her head, SOB, Cp at any point, and no recent illnesses.  She initially refused EMS transport, but then agreed to come because she developed some aching right hip pain, where she has had a prosthesis.  None-the-less, she was ambulatory despite the pain.    Past Medical History:  Diagnosis Date  . Breast cancer ([place])   . Cellulitis    R ARM  . Coronary artery disease   . History of breast cancer 10/02/2015  . History of uterine cancer 10/02/2015  . Hyperlipidemia   . Hypertension   . Hypothyroidism   . Uterine cancer [facility])     Patient Active Problem List   Diagnosis Date Noted  . Right bundle branch block 11/19/2015  . Chronic kidney disease, stage 4, severely decreased GFR (HCC) 10/03/2015  . History of breast cancer 10/02/2015  . History of uterine cancer 10/02/2015  . Anemia in chronic kidney disease 10/02/2015  . Carotid stenosis 09/26/2015  . Hyperlipidemia LDL goal <70   . Depression, major, single episode, mild ([place]) 10/22/2014  . Diabetic peripheral autonomic neuropathy ([place]) 12/04/2013  . Vitamin D deficiency 08/03/2012  . Diabetes mellitus with neurological manifestation ([place]) 01/26/2011  . Hypothyroidism 01/26/2011  . Essential hypertension 01/26/2011  . CAD (coronary artery disease) 01/26/2011    Past Surgical History:  Procedure Laterality Date  . APPENDECTOMY    . CARDIAC  CATHETERIZATION    . CORONARY ARTERY BYPASS GRAFT  2008  . MASTECTOMY  1979   right, followed by 2 years chemo  . NUCLEAR STRESS TEST  07/2009   EF 69%,  MILD ISCHEMIA IN INFEROLATERAL WALL  . TOTAL ABDOMINAL HYSTERECTOMY  1993  . TOTAL HIP ARTHROPLASTY  08/2009   right  . US ECHOCARDIOGRAPHY  05/2009   EF 55-60%,MILD-MOD LVH,, MILD-MOD A STENOSIS,    OB History    No data available       Home Medications    Prior to Admission medications   Medication Sig Start Date End Date Taking? Authorizing Provider  aspirin 81 MG tablet Take 81 mg by mouth daily.      Historical Provider, MD  atorvastatin (LIPITOR) 40 MG tablet Take 1 tablet (40 mg total) by mouth daily. 05/06/16   Amy Cletis Athens, MD  Blood Glucose Monitoring Suppl (ACCU-CHEK AVIVA) device Use to check blood sugar once daily.  Dx: E11.43 10/21/15   Jinny Sanders, MD  cyanocobalamin 1000 MCG tablet Take 1,000 mcg by mouth daily. Reported on 09/11/2015    Historical Provider, MD  Ferrous Sulfate (IRON) 325 (65 FE) MG TABS Take 1 tablet by mouth 2 (two) times daily. Reported on 09/11/2015    Historical Provider, MD  gabapentin (NEURONTIN) 100 MG capsule Take 100 mg by mouth 3 (three) times daily. Reported on 09/11/2015    Historical Provider, MD  gabapentin (NEURONTIN)  100 MG capsule TAKE ONE CAPSULE 3 TIMES A DAY 05/10/16   Amy E Diona Browner, MD  glipiZIDE (GLUCOTROL XL) 5 MG 24 hr tablet Take 1 tablet (5 mg total) by mouth daily with breakfast. 10/28/15   Amy E Diona Browner, MD  glucose blood (ACCU-CHEK AVIVA) test strip Use to check blood sugar once daily.  Dx: E11.43 10/21/15   Jinny Sanders, MD  Lancet Devices [facility]) lancets Use to check blood sugar once daily.  Dx: E11.43 10/21/15   Jinny Sanders, MD  Lancets (ACCU-CHEK SOFT TOUCH) lancets Use to check blood sugar once daily.  Dx: E11.43 10/21/15   Amy Cletis Athens, MD  levothyroxine (SYNTHROID, LEVOTHROID) 50 MCG tablet TAKE 1 TABLET BY MOUTH EVERY DAY 03/01/16   Amy Cletis Athens, MD    losartan (COZAAR) 50 MG tablet TAKE 1 TABLET (50 MG TOTAL) BY MOUTH DAILY. 03/01/16   Mihai Croitoru, MD  metoprolol tartrate (LOPRESSOR) 25 MG tablet Take 25 mg by mouth 2 (two) times daily.    Historical Provider, MD  nitroGLYCERIN (NITROSTAT) 0.4 MG SL tablet Place 1 tablet (0.4 mg total) under the tongue every 5 (five) minutes as needed for chest pain (x 3 tabs daily). 08/28/15   Brittainy M Simmons, PA-C  ondansetron (ZOFRAN) 4 MG tablet Take 1 tablet (4 mg total) by mouth every 8 (eight) hours as needed. 05/03/16   Jearld Fenton, NP  pioglitazone (ACTOS) 30 MG tablet TAKE 0.5 TABLETS (15 MG TOTAL) BY MOUTH DAILY. 03/01/16   Amy Cletis Athens, MD  sertraline (ZOLOFT) 25 MG tablet TAKE 1 TABLET (25 MG TOTAL) BY MOUTH DAILY. 06/29/15   Amy Cletis Athens, MD  triamterene-hydrochlorothiazide (DYAZIDE) 37.5-25 MG capsule Take 1 capsule by mouth daily. 07/03/15   Historical Provider, MD  Vitamin D, Ergocalciferol, (DRISDOL) 50000 units CAPS capsule TAKE 1 CAPSULE BY MOUTH EVERY 7 DAYS. 01/12/16   Amy Cletis Athens, MD    Family History Family History  Problem Relation Age of Onset  . Cancer Mother     ? stomach cancer  . Diabetes Mother   . Heart disease Father 53  . Diabetes Brother   . Throat cancer Brother     Social History Social History  Substance Use Topics  . Smoking status: Never Smoker  . Smokeless tobacco: Never Used  . Alcohol use No     Allergies   Levofloxacin and Morphine and related   Review of Systems Review of Systems  Constitutional: Negative for chills and fever.  HENT: Negative for ear pain and sore throat.   Eyes: Negative for pain and visual disturbance.  Respiratory: Negative for cough and shortness of breath.   Cardiovascular: Negative for chest pain and palpitations.  Gastrointestinal: Negative for abdominal pain and vomiting.  Genitourinary: Negative for dysuria and hematuria.  Musculoskeletal: Positive for arthralgias. Negative for back pain.  Skin: Negative for  color change and rash.  Neurological: Negative for seizures and syncope.  All other systems reviewed and are negative.    Physical Exam Updated Vital Signs BP (!) 127/51 (BP Location: Left Arm)   Pulse 61   Temp 97.8 F (36.6 C) (Oral)   Resp 20   SpO2 98%   Physical Exam  Constitutional: She appears well-developed and well-nourished. No distress.  HENT:  Head: Normocephalic and atraumatic.  Eyes: Conjunctivae are normal.  Neck: Neck supple.  Cardiovascular: Normal rate and regular rhythm.   No murmur heard. Pulmonary/Chest: Effort normal and breath sounds normal. No respiratory distress.  Abdominal: Soft. There is no tenderness.  Musculoskeletal: She exhibits no edema.  R hip: Inspection: no deformity, mild TTP ROM: full, with pain Strength: 4/5 in flexion and 4/5 in extension Pulses: distal pulses intact Sensation: distal sensation intact  L hip: Inspection: no deformity or tenderness ROM: full Strength: 5/5 in flexion and 5/5 in extension Pulses: distal pulses intact Sensation: distal sensation intact    Neurological: She is alert.  Skin: Skin is warm and dry.  Psychiatric: She has a normal mood and affect.  Nursing note and vitals reviewed.    ED Treatments / Results  Labs (all labs ordered are listed, but only abnormal results are displayed) Labs Reviewed - No data to display  EKG  EKG Interpretation None       Radiology No results found.  Procedures Procedures (including critical care time)  Medications Ordered in ED Medications - No data to display   Initial Impression / Assessment and Plan / ED Course  I have reviewed the triage vital signs and the nursing notes.  Pertinent labs & imaging results that were available during my care of the patient were reviewed by me and considered in my medical decision making (see chart for details).  Clinical Course    This patient had a fall, questionable syncope. There is no chest pain or other  symptoms that would support ACS. She did not hit her head. Her neurologic exam here is unremarkable, so doubt intracranial laterality. BMP h significant for mild increase in creatinine, so she was hydrated, and will encourage PCP follow-up for recheck.  no significant anemia.  X-ray of the hip was done due to pain, was negative for fracture.  Patient had pain with ambulation, so CT done and did show periprosthetic fracture.  Ortho consulted, and recommended admission to medicine.  Patient admitted.  Final Clinical Impressions(s) / ED Diagnoses   Final diagnoses:  None    New Prescriptions New Prescriptions   No medications on file     Levada Schilling, MD 05/15/16 0008    Merrily Pew, MD 05/16/16 [phone]

## 2016-05-14 NOTE — ED Notes (Signed)
Pt ambulated to bedside commode with 1 staff assist, pt in great deal of pain while trying to ambulate, favoring R hip. EDP aware, suggest CT of pelvis/hip.

## 2016-05-14 NOTE — Consult Note (Signed)
Reason for Consult:Pain in Right Hip Referring Physician: DR.Mesner  [name] is an 82 y.o. female.  HPI[place]  Past Medical History:  Diagnosis Date  . Breast cancer ([place])   . Cellulitis    R ARM  . Coronary artery disease   . History of breast cancer 10/02/2015  . History of uterine cancer 10/02/2015  . Hyperlipidemia   . Hypertension   . Hypothyroidism   . Uterine cancer [facility])     Past Surgical History:  Procedure Laterality Date  . APPENDECTOMY    . CARDIAC CATHETERIZATION    . CORONARY ARTERY BYPASS GRAFT  2008  . MASTECTOMY  1979   right, followed by 2 years chemo  . NUCLEAR STRESS TEST  07/2009   EF 69%,  MILD ISCHEMIA IN INFEROLATERAL WALL  . TOTAL ABDOMINAL HYSTERECTOMY  1993  . TOTAL HIP ARTHROPLASTY  08/2009   right  . US ECHOCARDIOGRAPHY  05/2009   EF 55-60%,MILD-MOD LVH,, MILD-MOD A STENOSIS,    Family History  Problem Relation Age of Onset  . Cancer Mother     ? stomach cancer  . Diabetes Mother   . Heart disease Father 25  . Diabetes Brother   . Throat cancer Brother     Social History:  reports that she has never smoked. She has never used smokeless tobacco. She reports that she does not drink alcohol or use drugs.  Allergies:  Allergies  Allergen Reactions  . Levofloxacin Nausea And Vomiting  . Morphine And Related Other (See Comments)    BAD HEADACHE  . Tape Rash    Medications: I have reviewed the patient's current medications.  Results for orders placed or performed during the hospital encounter of 05/14/16 (from the past 48 hour(s))  I-Stat Chem 8, ED     Status: Abnormal   Collection Time: 05/14/16  4:58 PM  Result Value Ref Range   Sodium 138 135 - 145 mmol/L   Potassium 4.2 3.5 - 5.1 mmol/L   Chloride 101 101 - 111 mmol/L   BUN 31 (H) 6 - 20 mg/dL   Creatinine, Ser 1.60 (H) 0.44 - 1.00 mg/dL   Glucose, Bld 285 (H) 65 - 99 mg/dL   Calcium, Ion 1.18 1.12 - 1.23 mmol/L   TCO2 24 0 - 100 mmol/L   Hemoglobin 11.9 (L) 12.0 - 15.0 g/dL   HCT 35.0 (L) 36.0 - 46.0 %    Ct Pelvis Wo Contrast  Result Date: 05/14/2016 CLINICAL DATA:  Status post fall with right hip pain. EXAM: CT PELVIS WITHOUT CONTRAST TECHNIQUE: Multidetector CT imaging of the pelvis was performed following the standard protocol without intravenous contrast. COMPARISON:  Plain films of earlier today and back to 09/03/2009. FINDINGS: Soft tissues: Abdominal aortic and branch vessel atherosclerosis. Status post pelvic node dissection. No sidewall hematoma. Normal caliber of pelvic bowel loops. Hysterectomy. No free pelvic fluid or extraluminal air. There is air within the urinary bladder. No subcutaneous hematoma. There is subcutaneous edema posterior to the right hip which is of indeterminate acuity. Example image 54/series 3 Bones: Mild osteopenia. Bilateral L5 pars defects with grade 1-2 L5-S1 anterolisthesis. Advanced degenerative disc disease at L3-4 through L5-S1. Status post long-stem right hip arthroplasty. Cortical irregularity is identified about the greater trochanter of the right femur, including on image 46/series 4. No callus deposition at this site. IMPRESSION: 1. Status post right hip arthroplasty. Cortical irregularity about the greater trochanter of the right  femur. Given absence of callus deposition, this is favored to represent an acute minimally displaced fracture. Of note, this area is not well evaluated on the initial postoperative exam of 2010. 2. Advanced lumbosacral spondylosis with grade 1-2 L5-S1 anterolisthesis and bilateral L5 pars defects. 3.  Aortic atherosclerosis. 4. Air within the urinary bladder.  Correlate with instrumentation. Electronically Signed   By: Abigail Miyamoto M.D.   On: 05/14/2016 20:40   Dg Hip Unilat With Pelvis 2-3 Views Right  Result Date: 05/14/2016 CLINICAL DATA:  Initial encounter for Fall from a bench onto concrete today. Pain in the right hip. Hx right hip  replacment EXAM: DG HIP (WITH OR WITHOUT PELVIS) 2-3V RIGHT COMPARISON:  None. FINDINGS: Right hip arthroplasty. Vascular calcifications. Left femoral head located. Degenerative disc disease at L4-5 and L5-S1. Extensive surgical clips throughout the pelvis. No acute fracture. Cortical irregularity about the greater trochanter right femur is favored to be postoperative. IMPRESSION: No acute osseous abnormality. Electronically Signed   By: Abigail Miyamoto M.D.   On: 05/14/2016 17:49    Review of Systems  Constitutional: Negative.   HENT: Negative.   Eyes: Negative.   Respiratory: Negative.   Cardiovascular: Negative.   Gastrointestinal: Negative.   Genitourinary: Negative.   Musculoskeletal: Positive for joint pain.  Skin: Negative.    Blood pressure 175/62, pulse 65, temperature 97.8 F (36.6 C), temperature source Oral, resp. rate 17, SpO2 96 %. Physical Exam  Constitutional: She appears well-developed.    HENT:  Head: Normocephalic.  Eyes: Pupils are equal, round, and reactive to light.  Neck: Normal range of motion.  Cardiovascular: Normal rate.   Respiratory: Effort normal.  GI: Soft.  Musculoskeletal:  Pain in Right Hip  Neurological: She is alert.  Skin: Skin is warm.  Psychiatric: She has a normal mood and affect.    Assessment/Plan: Admit for pain control.  Diania Co A 05/14/2016, 10:06 PM

## 2016-05-14 NOTE — ED Provider Notes (Signed)
I saw and evaluated the patient, reviewed the resident's note and I agree with the findings and plan.  Questionable syncope per bystanders, but really the patient went to sit down on a chair that wasn't there so they thought she passed out.  On exam with some anterior pelvic pain. Pain with walking as well.  Labs with AKI, rest normal. Likely dehydrated.  Will try some tylenol and attempt ambulation again. If does better, will dc. If not, will CT pelvis.    EKG Interpretation  Date/Time:  Friday May 14 2016 16:02:26 EDT Ventricular Rate:  60 PR Interval:    QRS Duration: 136 QT Interval:  469 QTC Calculation: 469 R Axis:   101 Text Interpretation:  Sinus rhythm Prolonged PR interval RBBB and LPFB No significant change since last tracing Confirmed by [facility] MD, Raissa Dam [phone]) on 05/14/2016 5:14:07 PM         Merrily Pew, MD 05/16/16 1203

## 2016-05-14 NOTE — ED Triage Notes (Signed)
Pt to ER BIB [facility] after having witnessed syncopal episode at a restaurant. Pt reports she did not lose consciousness however bystanders report otherwise. Pt said to have went to sit back into chair and fell back into chair. Pt reports feeling weak lately due to heat outside. No other symptoms. Pt only complaint is right hip pain, no obvious deformity noted. Pt a/o x4. NAD.

## 2016-05-14 NOTE — H&P (Signed)
History and Physical    [name] [id number] DOB: 1933/08/13 DOA: 05/14/2016  PCP: Eliezer Lofts, MD Consultants:  Croitoru - cardiologist; [facility] - ophthalmology; Alvy Bimler - onc; nephrologist Patient coming from: home   Chief Complaint: fall  HPI: [name] is a 82 y.o. female with medical history significant of breast/uterine cancer, chronic anemia, HTN, and HLD presenting following a mechanical fall.  Patient went to sit down down and missed the bench and fell to ground.  Went out to lunch and got hot so decided to sit down.  Fell on her right side.  Unable to get back up.  Someone helped her get up onto the bench.  Someone else called 911 and they brought her in.  Hurts only with weight bearing.    Sees Dr. Alvy Bimler for routine anemia.  Bone marrow production issue.  Gets a shot every 6 weeks so no longer needing transfusion.  Hip replacement in the past, about 8 years ago.  Did not require rehab, was able to go home.  She did not have a fracture at that time.     ED Course: Per Dr. Augustin Coupe: This patient had a fall, questionable syncope. There is no chest pain or other symptoms that would support ACS. She did not hit her head. Her neurologic exam here is unremarkable, so doubt intracranial laterality. BMP h significant for mild increase in creatinine, so she was hydrated, and will encourage PCP follow-up for recheck.  no significant anemia.  X-ray of the hip was done due to pain, was negative for fracture.  Patient had pain with ambulation, so CT done and did show periprosthetic fracture.  Ortho consulted, and recommended admission to medicine.  Review of Systems: As per HPI; otherwise 10 point review of systems reviewed and negative.   Ambulatory Status:  Needed a cane but preferred not to use it  Past Medical History:  Diagnosis Date  . Cellulitis    R ARM  . Coronary artery disease 2008   MI, s/p 5v CABG  . History of breast cancer 1979  . Hyperlipidemia   .  Hypertension   . Hypothyroidism   . Uterine cancer ([place]) 1991    Past Surgical History:  Procedure Laterality Date  . APPENDECTOMY    . CARDIAC CATHETERIZATION    . CORONARY ARTERY BYPASS GRAFT  2008  . MASTECTOMY  1979   right, followed by 2 years chemo  . NUCLEAR STRESS TEST  07/2009   EF 69%,  MILD ISCHEMIA IN INFEROLATERAL WALL  . TOTAL ABDOMINAL HYSTERECTOMY  1993  . TOTAL HIP ARTHROPLASTY  08/2009   right  . US ECHOCARDIOGRAPHY  05/2009   EF 55-60%,MILD-MOD LVH,, MILD-MOD A STENOSIS,    Social History   Social History  . Marital status: Divorced    Spouse name: N/A  . Number of children: N/A  . Years of education: N/A   Occupational History  . retired    Social History Main Topics  . Smoking status: Never Smoker  . Smokeless tobacco: Never Used  . Alcohol use No  . Drug use: No  . Sexual activity: No     Comment: 5 children, 4 daughters and 1 son, retired, active in church   Other Topics Concern  . Not on file   Social History Narrative  . No narrative on file    Allergies  Allergen Reactions  . Levofloxacin Nausea And Vomiting  . Morphine And Related Other (See Comments)    BAD HEADACHE  .  Tape Rash    Family History  Problem Relation Age of Onset  . Cancer Mother     ? stomach cancer  . Diabetes Mother   . Heart disease Father 16  . Diabetes Brother   . Throat cancer Brother     Prior to Admission medications   Medication Sig Start Date End Date Taking? Authorizing Provider  aspirin 81 MG tablet Take 81 mg by mouth daily.     Yes Historical Provider, MD  atorvastatin (LIPITOR) 40 MG tablet Take 1 tablet (40 mg total) by mouth daily. 05/06/16  Yes Amy Cletis Athens, MD  Blood Glucose Monitoring Suppl (ACCU-CHEK AVIVA) device Use to check blood sugar once daily.  Dx: E11.43 10/21/15  Yes Amy Cletis Athens, MD  cyanocobalamin 1000 MCG tablet Take 1,000 mcg by mouth daily. Reported on 09/11/2015   Yes Historical Provider, MD  Ferrous Sulfate (IRON) 325  (65 FE) MG TABS Take 1 tablet by mouth 2 (two) times daily. Reported on 09/11/2015   Yes Historical Provider, MD  gabapentin (NEURONTIN) 100 MG capsule TAKE ONE CAPSULE 3 TIMES A DAY Patient taking differently: Take 100 mg by mouth three times a day 05/10/16  Yes Amy E Bedsole, MD  glipiZIDE (GLUCOTROL XL) 5 MG 24 hr tablet Take 1 tablet (5 mg total) by mouth daily with breakfast. 10/28/15  Yes Amy E Bedsole, MD  glucose blood (ACCU-CHEK AVIVA) test strip Use to check blood sugar once daily.  Dx: E11.43 10/21/15  Yes Amy Cletis Athens, MD  Lancet Devices [facility]) lancets Use to check blood sugar once daily.  Dx: E11.43 10/21/15  Yes Amy Cletis Athens, MD  levothyroxine (SYNTHROID, LEVOTHROID) 50 MCG tablet TAKE 1 TABLET BY MOUTH EVERY DAY 03/01/16  Yes Amy E Bedsole, MD  losartan (COZAAR) 50 MG tablet TAKE 1 TABLET (50 MG TOTAL) BY MOUTH DAILY. 03/01/16  Yes Mihai Croitoru, MD  metoprolol succinate (TOPROL-XL) 25 MG 24 hr tablet Take 25 mg by mouth 2 (two) times daily.   Yes Historical Provider, MD  nitroGLYCERIN (NITROSTAT) 0.4 MG SL tablet Place 1 tablet (0.4 mg total) under the tongue every 5 (five) minutes as needed for chest pain (x 3 tabs daily). 08/28/15  Yes Brittainy M Simmons, PA-C  ondansetron (ZOFRAN) 4 MG tablet Take 1 tablet (4 mg total) by mouth every 8 (eight) hours as needed. 05/03/16  Yes Jearld Fenton, NP  pioglitazone (ACTOS) 30 MG tablet TAKE 0.5 TABLETS (15 MG TOTAL) BY MOUTH DAILY. 03/01/16  Yes Amy E Bedsole, MD  sertraline (ZOLOFT) 25 MG tablet TAKE 1 TABLET (25 MG TOTAL) BY MOUTH DAILY. 06/29/15  Yes Amy Cletis Athens, MD  triamterene-hydrochlorothiazide (DYAZIDE) 37.5-25 MG capsule Take 1 capsule by mouth daily. 07/03/15  Yes Historical Provider, MD  UNABLE TO FIND Darbepoetin Alfa- once every other month at [facility] Historical Provider, MD  Vitamin D, Ergocalciferol, (DRISDOL) 50000 units CAPS capsule TAKE 1 CAPSULE BY MOUTH EVERY 7 DAYS. 01/12/16  Yes Amy Cletis Athens, MD  Lancets  (ACCU-CHEK SOFT TOUCH) lancets Use to check blood sugar once daily.  Dx: [id number] 10/21/15   Jinny Sanders, MD    Physical Exam: Vitals:   05/14/16 2200 05/14/16 2230 05/14/16 2300 05/14/16 2342  BP: 188/58 184/66 176/59 (!) 184/57  Pulse: (!) 59 61 (!) 58 (!) 52  Resp: 18 20 15 16   Temp:    97.5 F (36.4 C)  TempSrc:    Oral  SpO2: 99% 100% 100%  100%  Weight:    84.6 kg (186 lb 9.6 oz)  Height:    5\' 3"  (1.6 m)     General:  Appears calm and comfortable and is NAD Eyes:  PERRL, EOMI, normal lids, iris ENT:  grossly normal hearing, lips & tongue, mmm Neck:  no LAD, masses or thyromegaly Cardiovascular:  RRR, no m/r/g. No LE edema.  Respiratory:  CTA bilaterally, no w/r/r. Normal respiratory effort. Abdomen:  soft, ntnd, NABS Skin:  no rash or induration seen on limited exam Musculoskeletal:  grossly normal tone BUE/BLE, good ROM, no bony abnormality; no hip deformity or tenderness, no ecchymosis, 4/5 strength on R Psychiatric:  grossly normal mood and affect, speech fluent and appropriate, AOx3 Neurologic:  CN 2-12 grossly intact, moves all extremities in coordinated fashion, sensation intact  Labs on Admission: I have personally reviewed following labs and imaging studies  CBC:  Recent Labs Lab 05/14/16 1658  HGB 11.9*  HCT [id number]*   Basic Metabolic Panel:  Recent Labs Lab 05/14/16 1658  NA 138  K 4.2  CL 101  GLUCOSE 285*  BUN 31*  CREATININE 1.60*   GFR: Estimated Creatinine Clearance: 28.4 mL/min (by C-G formula based on SCr of 1.6 mg/dL). Liver Function Tests: No results for input(s): AST, ALT, ALKPHOS, BILITOT, PROT, ALBUMIN in the last 168 hours. No results for input(s): LIPASE, AMYLASE in the last 168 hours. No results for input(s): AMMONIA in the last 168 hours. Coagulation Profile: No results for input(s): INR, PROTIME in the last 168 hours. Cardiac Enzymes: No results for input(s): CKTOTAL, CKMB, CKMBINDEX, TROPONINI in the last 168 hours. BNP (last  3 results) No results for input(s): PROBNP in the last 8760 hours. HbA1C: No results for input(s): HGBA1C in the last 72 hours. CBG: No results for input(s): GLUCAP in the last 168 hours. Lipid Profile: No results for input(s): CHOL, HDL, LDLCALC, TRIG, CHOLHDL, LDLDIRECT in the last 72 hours. Thyroid Function Tests: No results for input(s): TSH, T4TOTAL, FREET4, T3FREE, THYROIDAB in the last 72 hours. Anemia Panel: No results for input(s): VITAMINB12, FOLATE, FERRITIN, TIBC, IRON, RETICCTPCT in the last 72 hours. Urine analysis:    Component Value Date/Time   COLORURINE YELLOW 09/15/2015 1432   APPEARANCEUR HAZY (A) 09/15/2015 1432   LABSPEC 1.022 09/15/2015 1432   PHURINE 5.5 09/15/2015 1432   GLUCOSEU 100 (A) 09/15/2015 1432   HGBUR LARGE (A) 09/15/2015 1432   BILIRUBINUR NEGATIVE 09/15/2015 1432   BILIRUBINUR negative 10/22/2014 1220   KETONESUR NEGATIVE 09/15/2015 1432   PROTEINUR >300 (A) 09/15/2015 1432   UROBILINOGEN 0.2 10/22/2014 1220   UROBILINOGEN 0.2 10/11/2014 1233   NITRITE NEGATIVE 09/15/2015 1432   LEUKOCYTESUR NEGATIVE 09/15/2015 1432    Creatinine Clearance: Estimated Creatinine Clearance: 28.4 mL/min (by C-G formula based on SCr of 1.6 mg/dL).  Sepsis Labs: @LABRCNTIP (procalcitonin:4,lacticidven:4) )No results found for this or any previous visit (from the past 240 hour(s)).   Radiological Exams on Admission: Ct Pelvis Wo Contrast  Result Date: 05/14/2016 CLINICAL DATA:  Status post fall with right hip pain. EXAM: CT PELVIS WITHOUT CONTRAST TECHNIQUE: Multidetector CT imaging of the pelvis was performed following the standard protocol without intravenous contrast. COMPARISON:  Plain films of earlier today and back to 09/03/2009. FINDINGS: Soft tissues: Abdominal aortic and branch vessel atherosclerosis. Status post pelvic node dissection. No sidewall hematoma. Normal caliber of pelvic bowel loops. Hysterectomy. No free pelvic fluid or extraluminal air.  There is air within the urinary bladder. No subcutaneous hematoma. There is subcutaneous  edema posterior to the right hip which is of indeterminate acuity. Example image 54/series 3 Bones: Mild osteopenia. Bilateral L5 pars defects with grade 1-2 L5-S1 anterolisthesis. Advanced degenerative disc disease at L3-4 through L5-S1. Status post long-stem right hip arthroplasty. Cortical irregularity is identified about the greater trochanter of the right femur, including on image 46/series 4. No callus deposition at this site. IMPRESSION: 1. Status post right hip arthroplasty. Cortical irregularity about the greater trochanter of the right femur. Given absence of callus deposition, this is favored to represent an acute minimally displaced fracture. Of note, this area is not well evaluated on the initial postoperative exam of 2010. 2. Advanced lumbosacral spondylosis with grade 1-2 L5-S1 anterolisthesis and bilateral L5 pars defects. 3.  Aortic atherosclerosis. 4. Air within the urinary bladder.  Correlate with instrumentation. Electronically Signed   By: Abigail Miyamoto M.D.   On: 05/14/2016 20:40   Dg Hip Unilat With Pelvis 2-3 Views Right  Result Date: 05/14/2016 CLINICAL DATA:  Initial encounter for Fall from a bench onto concrete today. Pain in the right hip. Hx right hip replacment EXAM: DG HIP (WITH OR WITHOUT PELVIS) 2-3V RIGHT COMPARISON:  None. FINDINGS: Right hip arthroplasty. Vascular calcifications. Left femoral head located. Degenerative disc disease at L4-5 and L5-S1. Extensive surgical clips throughout the pelvis. No acute fracture. Cortical irregularity about the greater trochanter right femur is favored to be postoperative. IMPRESSION: No acute osseous abnormality. Electronically Signed   By: Abigail Miyamoto M.D.   On: 05/14/2016 17:49    EKG: Independently reviewed.  NSR with rate 60, RBBB; NSCSLT  Assessment/Plan Principal Problem:   Hip fracture (HCC) Active Problems:   Diabetes mellitus with  neurological manifestation (HCC)   Hypothyroidism   Acute kidney injury superimposed on chronic kidney disease (HCC)   Hyperlipidemia LDL goal <70   Anemia in chronic kidney disease   Chronic kidney disease (CKD), stage III (moderate)   Hip fracture -Periprosthetic hip fracture, nondisplaced -Uncertain if this will need surgical intervention so will leave NPO after midnight while awaiting ortho consultation again in AM -For now, will admit to med surg -Pain control with PO Vicodin/IV Dilaudid - patient only has pain with weight-bearing -If no need for surgery, early PT evaluation for rehab/SNF placement  HLD -While unrelated to current admission, review of recent lipid testing shows GROSSLY uncontrolled disease -Patient with h/o 5v CABG and so LDL goal is <70; her LDL is 235! -She is taking Lipitor 40 mg daily -Upon further discussion, she stopped taking her Lipitor about 1-2 months ago after developing leg pain; she has since restarted it after discussion with her PCP at the time of that blood draw -In further review, however, her LDL has never been at goal.   -Would suggest increasing Lipitor to 80 mg daily -If unable to tolerate due to myalgias, would attempt Crestor trial  AKI on CKD -Baseline creatinine appears to be 1.23, although it has been as high as 1.9 fairly recently -Mildly elevated today at 1.60 -Will give gentle IVF and follow  Anemia -Stable on erythropoietin  Hypothyroidism -Recent normal thyroid testing -Continue Synthroid at current dose  DM -Recent A1c 6.4 -hold Glucotrol, Actos -Cover with SSI    DVT prophylaxis: Lovenox Code Status: Full - confirmed with patient/family Family Communication: Several family members present throughout Disposition Plan: To be determined Consults called: Orthopedics  Admission status: Admit to Med Surg    Karmen Bongo MD [facility]  If 7PM-7AM, please contact night-coverage [email or web address] Password  [email or web address]  05/15/2016, 2:17 AM

## 2016-05-14 NOTE — ED Notes (Signed)
Ortho at bedside.

## 2016-05-14 NOTE — ED Notes (Signed)
Requested that EDP speak with pt and give update.

## 2016-05-14 NOTE — ED Notes (Signed)
EDP at bedside  

## 2016-05-15 ENCOUNTER — Encounter (HOSPITAL_COMMUNITY): Payer: Self-pay | Admitting: Family

## 2016-05-15 ENCOUNTER — Observation Stay (HOSPITAL_COMMUNITY): Payer: Medicare Other

## 2016-05-15 DIAGNOSIS — N183 Chronic kidney disease, stage 3 unspecified: Secondary | ICD-10-CM | POA: Diagnosis present

## 2016-05-15 DIAGNOSIS — M9701XA Periprosthetic fracture around internal prosthetic right hip joint, initial encounter: Secondary | ICD-10-CM | POA: Diagnosis not present

## 2016-05-15 DIAGNOSIS — E785 Hyperlipidemia, unspecified: Secondary | ICD-10-CM | POA: Diagnosis not present

## 2016-05-15 DIAGNOSIS — N179 Acute kidney failure, unspecified: Secondary | ICD-10-CM | POA: Diagnosis not present

## 2016-05-15 DIAGNOSIS — E039 Hypothyroidism, unspecified: Secondary | ICD-10-CM | POA: Diagnosis not present

## 2016-05-15 DIAGNOSIS — R0789 Other chest pain: Secondary | ICD-10-CM | POA: Diagnosis not present

## 2016-05-15 DIAGNOSIS — I129 Hypertensive chronic kidney disease with stage 1 through stage 4 chronic kidney disease, or unspecified chronic kidney disease: Secondary | ICD-10-CM | POA: Diagnosis not present

## 2016-05-15 DIAGNOSIS — E1149 Type 2 diabetes mellitus with other diabetic neurological complication: Secondary | ICD-10-CM | POA: Diagnosis not present

## 2016-05-15 DIAGNOSIS — S72001A Fracture of unspecified part of neck of right femur, initial encounter for closed fracture: Secondary | ICD-10-CM | POA: Diagnosis not present

## 2016-05-15 LAB — CBC
HEMATOCRIT: 30.3 % — AB (ref 36.0–46.0)
HEMOGLOBIN: 9.8 g/dL — AB (ref 12.0–15.0)
MCH: 29.5 pg (ref 26.0–34.0)
MCHC: 32.3 g/dL (ref 30.0–36.0)
MCV: 91.3 fL (ref 78.0–100.0)
Platelets: 138 10*3/uL — ABNORMAL LOW (ref 150–400)
RBC: 3.32 MIL/uL — ABNORMAL LOW (ref 3.87–5.11)
RDW: 15.2 % (ref 11.5–15.5)
WBC: 7 10*3/uL (ref 4.0–10.5)

## 2016-05-15 LAB — BASIC METABOLIC PANEL
ANION GAP: 6 (ref 5–15)
BUN: 24 mg/dL — ABNORMAL HIGH (ref 6–20)
CALCIUM: 8.9 mg/dL (ref 8.9–10.3)
CHLORIDE: 106 mmol/L (ref 101–111)
CO2: 26 mmol/L (ref 22–32)
Creatinine, Ser: 1.28 mg/dL — ABNORMAL HIGH (ref 0.44–1.00)
GFR calc Af Amer: 44 mL/min — ABNORMAL LOW (ref >60)
GFR calc non Af Amer: 38 mL/min — ABNORMAL LOW (ref >60)
GLUCOSE: 119 mg/dL — AB (ref 65–99)
Potassium: 3.9 mmol/L (ref 3.5–5.1)
Sodium: 138 mmol/L (ref 135–145)

## 2016-05-15 LAB — GLUCOSE, CAPILLARY
GLUCOSE-CAPILLARY: 181 mg/dL — AB (ref 65–99)
GLUCOSE-CAPILLARY: 197 mg/dL — AB (ref 65–99)
Glucose-Capillary: 115 mg/dL — ABNORMAL HIGH (ref 65–99)

## 2016-05-15 MED ORDER — GLUCERNA SHAKE PO LIQD
237.0000 mL | Freq: Two times a day (BID) | ORAL | Status: DC
  Administered 2016-05-15 – 2016-05-17 (×5): 237 mL via ORAL

## 2016-05-15 NOTE — Progress Notes (Addendum)
Patient Demographics:    [name], is a 82 y.o. female, DOB - July 26, 1933, [id number]  Admit date - 05/14/2016   Admitting Physician Karmen Bongo, MD  Outpatient Primary MD for the patient is Eliezer Lofts, MD  LOS - 0   Chief Complaint  Patient presents with  . Loss of Consciousness  . Fall        Subjective:    [name] today has no fevers, no emesis,  No chest pain,  Without shortness of breath, no dizziness,    Assessment  & Plan :    Principal Problem:   Hip fracture ([place]) Active Problems:   Diabetes mellitus with neurological manifestation (HCC)   Hypothyroidism   Acute kidney injury superimposed on chronic kidney disease ([place])   Hyperlipidemia LDL goal <70   Anemia in chronic kidney disease   Chronic kidney disease (CKD), stage III (moderate)  Interval history:-  82 y.o. female with medical history significant of breast/uterine cancer, chronic anemia, HTN, and HLD presenting following a mechanical fall. The ED patient is found to have right greater trochanter fracture that is not displaced it is around her previous hip prosthesis  1)Rt Greater Trochanteric Fracture-without displacement. Prosthetic, or surgeon advised weightbearing With a rolling walker without active abduction for 6 wks. Phys therapy evaluation pending   2)Anemia- stable, and tingling erythropoietin replacing agent by Dr Alvy Bimler. Continue iron sulfate  3)CAD- prior 5 vessel CABG, asymptomatic at this time, continue aspirin and metoprolol . LDL is not at goal, (LDL 235), some problems with compliance, admitting physician increase Lipitor to 80 mg daily, watch for myalgias and other signs or symptoms of rhabdo.  4)DM- good control, recent A1c 6.4, Allow some permissive Hyperglycemia rather than risk life-threatening hypoglycemia in a patient with unreliable oral intake. Use Novolog/Humalog Sliding scale  insulin with Accu-Cheks/Fingersticks as ordered Glucotrol and Actos on hold  Code Status : full   Disposition Plan  : After physical therapy evaluation  Consults  :  ortho   DVT Prophylaxis  :  Lovenox -  Lab Results  Component Value Date   PLT 138 (L) 05/15/2016    Inpatient Medications  Scheduled Meds: . aspirin  325 mg Oral Daily  . atorvastatin  80 mg Oral q1800  . enoxaparin (LOVENOX) injection  30 mg Subcutaneous Daily  . feeding supplement (GLUCERNA SHAKE)  237 mL Oral BID BM  . ferrous sulfate  325 mg Oral BID WC  . gabapentin  100 mg Oral TID  . insulin aspart  0-15 Units Subcutaneous TID WC  . levothyroxine  50 mcg Oral QAC breakfast  . metoprolol succinate  25 mg Oral BID  . sertraline  25 mg Oral Daily   Continuous Infusions:  PRN Meds:.HYDROcodone-acetaminophen, HYDROmorphone (DILAUDID) injection, senna-docusate    Anti-infectives    None        Objective:   Vitals:   05/14/16 2300 05/14/16 2342 05/15/16 0406 05/15/16 1420  BP: 176/59 (!) 184/57 (!) 166/92 (!) 152/55  Pulse: (!) 58 (!) 52 (!) 57 63  Resp: 15 16 16    Temp:  97.5 F (36.4 C) 98 F (36.7 C) 98.1 F (36.7 C)  TempSrc:  Oral Oral Oral  SpO2: 100% 100% 98% 96%  Weight:  84.6 kg (186 lb 9.6 oz)    Height:  5\' 3"  (1.6 m)      Wt Readings from Last 3 Encounters:  05/14/16 84.6 kg (186 lb 9.6 oz)  05/12/16 84.4 kg (186 lb)  05/06/16 83.9 kg (185 lb)     Intake/Output Summary (Last 24 hours) at 05/15/16 1838 Last data filed at 05/15/16 0408  Gross per 24 hour  Intake             1000 ml  Output              400 ml  Net              600 ml     Physical Exam  Gen:- Awake Alert,  In no apparent distress  HEENT:- [place].AT, No sclera icterus Neck-Supple Neck,No JVD,.  Lungs-  CTAB  CV- S1, S2 normal Abd-  +ve B.Sounds, Abd Soft, No tenderness,    Extremity/Skin:- No  edema,   pain with palpation or range of motion of right hip, no obvious deformity, pulses are good     Data Review:   Micro Results No results found for this or any previous visit (from the past 240 hour(s)).  Radiology Reports Ct Pelvis Wo Contrast  Result Date: 05/14/2016 CLINICAL DATA:  Status post fall with right hip pain. EXAM: CT PELVIS WITHOUT CONTRAST TECHNIQUE: Multidetector CT imaging of the pelvis was performed following the standard protocol without intravenous contrast. COMPARISON:  Plain films of earlier today and back to 09/03/2009. FINDINGS: Soft tissues: Abdominal aortic and branch vessel atherosclerosis. Status post pelvic node dissection. No sidewall hematoma. Normal caliber of pelvic bowel loops. Hysterectomy. No free pelvic fluid or extraluminal air. There is air within the urinary bladder. No subcutaneous hematoma. There is subcutaneous edema posterior to the right hip which is of indeterminate acuity. Example image 54/series 3 Bones: Mild osteopenia. Bilateral L5 pars defects with grade 1-2 L5-S1 anterolisthesis. Advanced degenerative disc disease at L3-4 through L5-S1. Status post long-stem right hip arthroplasty. Cortical irregularity is identified about the greater trochanter of the right femur, including on image 46/series 4. No callus deposition at this site. IMPRESSION: 1. Status post right hip arthroplasty. Cortical irregularity about the greater trochanter of the right femur. Given absence of callus deposition, this is favored to represent an acute minimally displaced fracture. Of note, this area is not well evaluated on the initial postoperative exam of 2010. 2. Advanced lumbosacral spondylosis with grade 1-2 L5-S1 anterolisthesis and bilateral L5 pars defects. 3.  Aortic atherosclerosis. 4. Air within the urinary bladder.  Correlate with instrumentation. Electronically Signed   By: Abigail Miyamoto M.D.   On: 05/14/2016 20:40   Chest Portable 1 View  Result Date: 05/15/2016 CLINICAL DATA:  Hip right fx, pt has been NPO since midnight. No chest complaints this am. Non smoker.  Diabetic. Htn. Hx bypass sx in 1979 per pt. EXAM: PORTABLE CHEST 1 VIEW COMPARISON:  10/28/2015 FINDINGS: Status post CABG surgery. Cardiac silhouette is mildly enlarged. There is a moderate size hiatal hernia, appearing smaller than on the prior exam. Mediastinal contours otherwise unremarkable. No hilar masses or adenopathy. Clear lungs.  No pleural effusion.  No pneumothorax. Bony thorax is demineralized but grossly intact. IMPRESSION: 1. No acute cardiopulmonary disease. Electronically Signed   By: Lajean Manes M.D.   On: 05/15/2016 07:57   Dg Hip Unilat With Pelvis 2-3 Views Right  Result Date: 05/14/2016 CLINICAL DATA:  Initial encounter for Fall from a bench onto  concrete today. Pain in the right hip. Hx right hip replacment EXAM: DG HIP (WITH OR WITHOUT PELVIS) 2-3V RIGHT COMPARISON:  None. FINDINGS: Right hip arthroplasty. Vascular calcifications. Left femoral head located. Degenerative disc disease at L4-5 and L5-S1. Extensive surgical clips throughout the pelvis. No acute fracture. Cortical irregularity about the greater trochanter right femur is favored to be postoperative. IMPRESSION: No acute osseous abnormality. Electronically Signed   By: Abigail Miyamoto M.D.   On: 05/14/2016 17:49     CBC  Recent Labs Lab 05/14/16 1658 05/15/16 0428  WBC  --  7.0  HGB 11.9* 9.8*  HCT 35.0* 30.3*  PLT  --  138*  MCV  --  91.3  MCH  --  29.5  MCHC  --  32.3  RDW  --  15.2    Chemistries   Recent Labs Lab 05/14/16 1658 05/15/16 0428  NA 138 138  K 4.2 3.9  CL 101 106  CO2  --  26  GLUCOSE 285* 119*  BUN 31* 24*  CREATININE 1.60* 1.28*  CALCIUM  --  8.9   ------------------------------------------------------------------------------------------------------------------ No results for input(s): CHOL, HDL, LDLCALC, TRIG, CHOLHDL, LDLDIRECT in the last 72 hours.  Lab Results  Component Value Date   HGBA1C 6.4 04/28/2016    ------------------------------------------------------------------------------------------------------------------ No results for input(s): TSH, T4TOTAL, T3FREE, THYROIDAB in the last 72 hours.  Invalid input(s): FREET3 ------------------------------------------------------------------------------------------------------------------ No results for input(s): VITAMINB12, FOLATE, FERRITIN, TIBC, IRON, RETICCTPCT in the last 72 hours.  Coagulation profile No results for input(s): INR, PROTIME in the last 168 hours.  No results for input(s): DDIMER in the last 72 hours.  Cardiac Enzymes No results for input(s): CKMB, TROPONINI, MYOGLOBIN in the last 168 hours.  Invalid input(s): CK ------------------------------------------------------------------------------------------------------------------    Component Value Date/Time   BNP 572.6 (H) 09/16/2015 1830     Leoncio Hansen M.D on 05/15/2016 at 6:38 PM  Between 7am to 7pm - Pager - [phone]  After 7pm go to [email or web address] - password [email or web address]  [facility] -  Office  [phone]  Dragon dictation system was used to create this note, attempts have been made to correct errors, however presence of uncorrected errors is not a reflection quality of care provided

## 2016-05-15 NOTE — Progress Notes (Addendum)
   Subjective:  Patient reports pain as moderate.   Objective:   VITALS:   Vitals:   05/14/16 2230 05/14/16 2300 05/14/16 2342 05/15/16 0406  BP: 184/66 176/59 (!) 184/57 (!) 166/92  Pulse: 61 (!) 58 (!) 52 (!) 57  Resp: 20 15 16 16   Temp:   97.5 F (36.4 C) 98 F (36.7 C)  TempSrc:   Oral Oral  SpO2: 100% 100% 100% 98%  Weight:   84.6 kg (186 lb 9.6 oz)   Height:   5\' 3"  (1.6 m)     ABD soft Sensation intact distally Intact pulses distally Dorsiflexion/Plantar flexion intact TTP greater trochanter   Lab Results  Component Value Date   WBC 7.0 05/15/2016   HGB 9.8 (L) 05/15/2016   HCT 30.3 (L) 05/15/2016   MCV 91.3 05/15/2016   PLT 138 (L) 05/15/2016   BMET    Component Value Date/Time   NA 138 05/15/2016 0428   NA 142 10/02/2015 1432   K 3.9 05/15/2016 0428   K 4.8 10/02/2015 1432   CL 106 05/15/2016 0428   CO2 26 05/15/2016 0428   CO2 23 10/02/2015 1432   GLUCOSE 119 (H) 05/15/2016 0428   GLUCOSE 204 (H) 10/02/2015 1432   BUN 24 (H) 05/15/2016 0428   BUN 40.7 (H) 10/02/2015 1432   CREATININE 1.28 (H) 05/15/2016 0428   CREATININE 1.9 (H) 10/02/2015 1432   CALCIUM 8.9 05/15/2016 0428   CALCIUM 9.0 10/02/2015 1432   GFRNONAA 38 (L) 05/15/2016 0428   GFRAA 44 (L) 05/15/2016 0428     Assessment/Plan:     Principal Problem:   Hip fracture (HCC) Active Problems:   Diabetes mellitus with neurological manifestation (HCC)   Hypothyroidism   Acute kidney injury superimposed on chronic kidney disease ([place])   Hyperlipidemia LDL goal <70   Anemia in chronic kidney disease   Chronic kidney disease (CKD), stage III (moderate)   WBAT RLE with walker No active abduction for 6 weeks PT/OT   [name], [name] 05/15/2016, 8:37 AM   Rod Can, MD Cell [phone]

## 2016-05-15 NOTE — Progress Notes (Signed)
Initial Nutrition Assessment  DOCUMENTATION CODES:   Obesity unspecified  INTERVENTION:  Provide Glucerna Shake po BID, each supplement provides 220 kcal and 10 grams of protein.  Encourage adequate PO intake.   NUTRITION DIAGNOSIS:   Increased nutrient needs related to  (acute injury) as evidenced by estimated needs.  GOAL:   Patient will meet greater than or equal to 90% of their needs  MONITOR:   PO intake, Supplement acceptance, Labs, Weight trends, Skin, I & O's  REASON FOR ASSESSMENT:   Consult Hip fracture protocol  ASSESSMENT:   82 y.o. female with medical history significant of breast/uterine cancer, chronic anemia, HTN, and HLD presenting following a mechanical fall.  CT done and did show periprosthetic fracture  Pt reports appetite is fine currently and PTA with usual consumption of at least 3 meals a day with an Ensure/Boost shake on occasion. No percent meal completion recorded, however pt reports 50% intake at breakfast this AM. Weight has been stable per weight records. RD to order Glucerna Shake to aid in caloric and protein needs.   Pt with no observed significant fat or muscle mass loss.   Labs and medications reviewed.   Diet Order:  Diet Carb Modified Fluid consistency: Thin; Room service appropriate? Yes  Skin:  Reviewed, no issues  Last BM:  8/18  Height:   Ht Readings from Last 1 Encounters:  05/14/16 5\' 3"  (1.6 m)    Weight:   Wt Readings from Last 1 Encounters:  05/14/16 186 lb 9.6 oz (84.6 kg)    Ideal Body Weight:  52.27 kg  BMI:  Body mass index is 33.05 kg/m.  Estimated Nutritional Needs:   Kcal:  1650-1800  Protein:  75-85 grams  Fluid:  1.6 - 1.8 L/day  EDUCATION NEEDS:   No education needs identified at this time  Corrin Parker, MS, RD, LDN Pager # [phone] After hours/ weekend pager # [phone]

## 2016-05-16 DIAGNOSIS — Z96641 Presence of right artificial hip joint: Secondary | ICD-10-CM | POA: Diagnosis not present

## 2016-05-16 DIAGNOSIS — S72001A Fracture of unspecified part of neck of right femur, initial encounter for closed fracture: Secondary | ICD-10-CM | POA: Diagnosis not present

## 2016-05-16 DIAGNOSIS — E785 Hyperlipidemia, unspecified: Secondary | ICD-10-CM | POA: Diagnosis not present

## 2016-05-16 LAB — GLUCOSE, CAPILLARY
GLUCOSE-CAPILLARY: 166 mg/dL — AB (ref 65–99)
Glucose-Capillary: 106 mg/dL — ABNORMAL HIGH (ref 65–99)
Glucose-Capillary: 191 mg/dL — ABNORMAL HIGH (ref 65–99)

## 2016-05-16 MED ORDER — ACETAMINOPHEN 325 MG PO TABS
650.0000 mg | ORAL_TABLET | Freq: Four times a day (QID) | ORAL | Status: DC | PRN
Start: 2016-05-16 — End: 2016-05-17
  Administered 2016-05-16: 650 mg via ORAL
  Filled 2016-05-16: qty 2

## 2016-05-16 MED ORDER — HYDRALAZINE HCL 20 MG/ML IJ SOLN: 5.0000 mg | Freq: Four times a day (QID) | INTRAMUSCULAR | Status: DC | PRN

## 2016-05-16 NOTE — Evaluation (Signed)
Occupational Therapy Evaluation Patient Details Name: [name] MRN: [id number] DOB: [birth year] Today's Date: 05/16/2016    History of Present Illness [name] is a 82 y.o. female with medical history significant of breast/uterine cancer, chronic anemia, HTN, and HLD presenting following a mechanical fall with resultant periprosthetic fracture right side (current decision is to treat conservatively without sx with WBAT and no active abduction)    Clinical Impression   This 82 yo female admitted and underwent above presents to acute OT with deficits below (see OT problem list) thus affecting her PLOF of independent for basic and IADLs at home. She will benefit from acute OT with follow up OT at SNF to get back to PLOF to return home by herself.    Follow Up Recommendations  SNF    Equipment Recommendations  Other (comment) (TBD next venue)       Precautions / Restrictions Precautions Precautions: Fall Precaution Comments: No active abduction Restrictions Weight Bearing Restrictions: No RLE Weight Bearing: Weight bearing as tolerated      Mobility Bed Mobility Overal bed mobility: Needs Assistance Bed Mobility: Supine to Sit     Supine to sit: Min guard        Transfers Overall transfer level: Needs assistance Equipment used: Rolling walker (2 wheeled) Transfers: Sit to/from Stand Sit to Stand: Min assist         General transfer comment: VCs for safe hand placement    Balance Overall balance assessment: Needs assistance Sitting-balance support: No upper extremity supported;Feet supported Sitting balance-Leahy Scale: Fair     Standing balance support: During functional activity;Single extremity supported Standing balance-Leahy Scale: Poor Standing balance comment: for the most part reliant on at least one UE on a supporting surface while in standing--intermittently she had both arms of a supported surface while she applied toothpaste to  toothbrush                            ADL Overall ADL's : Needs assistance/impaired Eating/Feeding: Independent;Sitting   Grooming: Min guard;Standing;Oral care   Upper Body Bathing: Set up;Sitting   Lower Body Bathing: Moderate assistance (min A sit<>stand)   Upper Body Dressing : Set up;Sitting   Lower Body Dressing: Moderate assistance (min A sit<>stand)   Toilet Transfer: Minimal assistance;Ambulation;RW Toilet Transfer Details (indicate cue type and reason): bed>sink>sit in recliner in room Toileting- Clothing Manipulation and Hygiene: Minimal assistance;Sit to/from stand                         Pertinent Vitals/Pain Pain Assessment: 0-10 Pain Score:  (varied from 2-10) Pain Location: R hip area Pain Descriptors / Indicators: Guarding;Grimacing Pain Intervention(s): Limited activity within patient's tolerance;Monitored during session;Repositioned        Extremity/Trunk Assessment Upper Extremity Assessment Upper Extremity Assessment: Overall WFL for tasks assessed   Lower Extremity Assessment Lower Extremity Assessment: Defer to PT evaluation          Cognition Arousal/Alertness: Awake/alert Behavior During Therapy: WFL for tasks assessed/performed Overall Cognitive Status: Within Functional Limits for tasks assessed                                Home Living Family/patient expects to be discharged to:: Skilled nursing facility   Available Help at Discharge:  (no one available due to everyone that has helped her in the past has started  back to school)                                         OT Diagnosis: Generalized weakness;Acute pain   OT Problem List: Decreased range of motion;Impaired balance (sitting and/or standing);Decreased knowledge of precautions;Pain   OT Treatment/Interventions: Self-care/ADL training;Balance training;Therapeutic activities;Patient/family education;DME and/or AE instruction     OT Goals(Current goals can be found in the care plan section) Acute Rehab OT Goals Patient Stated Goal: to be able to go home OT Goal Formulation: With patient Time For Goal Achievement: 05/23/16 Potential to Achieve Goals: Good  OT Frequency: Min 2X/week   Barriers to D/C: Decreased caregiver support          Co-evaluation PT/OT/SLP Co-Evaluation/Treatment: Yes Reason for Co-Treatment: For patient/therapist safety   OT goals addressed during session: ADL's and self-care;Strengthening/ROM      End of Session Equipment Utilized During Treatment: Gait belt;Rolling walker Nurse Communication: Mobility status (WBAT and no active abduction, needs to come out on left side of bed and in on right side of bed, when turning around needs to turn left)  Activity Tolerance: Patient tolerated treatment well Patient left: in chair;with call bell/phone within reach;with chair alarm set   Time: [phone] OT Time Calculation (min): 34 min Charges:  OT General Charges $OT Visit: 1 Procedure OT Evaluation $OT Eval Moderate Complexity: 1 Procedure G-Codes: OT G-codes **NOT FOR INPATIENT CLASS** Functional Assessment Tool Used: clinical observation Functional Limitation: Self care Self Care Current Status [id number]): At least 40 percent but less than 60 percent impaired, limited or restricted Self Care Goal Status [id number]): At least 20 percent but less than 40 percent impaired, limited or restricted  [name] [id number] 05/16/2016, 11:39 AM

## 2016-05-16 NOTE — Progress Notes (Signed)
Patient ID: [name], female   DOB: [birth year], 82 y.o.   MRN: [id number]                                                                PROGRESS NOTE                                                                                                                                                                                                             Patient Demographics:    [name], is a 82 y.o. female, DOB - 1933/05/28, [id number]  Admit date - 05/14/2016   Admitting Physician Karmen Bongo, MD  Outpatient Primary MD for the patient is Eliezer Lofts, MD  LOS - 0  Outpatient Specialists: Hector Shade (orthopedic)  Chief Complaint  Patient presents with  . Loss of Consciousness  . Fall       Brief Narrative  82 y.o.femalewith medical history significant of breast/uterine cancer, chronic anemia, HTN, and HLD presenting following a mechanical fall. The ED patient is found to have right greater trochanter fracture that is not displaced it is around her previous hip prosthesis   Subjective:    [name] today has, No headache, No chest pain, No abdominal pain - No Nausea, No new weakness tingling or numbness, No Cough - SOB. Pt willing to go to rehab.    Assessment  & Plan :    Principal Problem:   Hip fracture ([place]) Active Problems:   Diabetes mellitus with neurological manifestation (HCC)   Hypothyroidism   Acute kidney injury superimposed on chronic kidney disease (HCC)   Hyperlipidemia LDL goal <70   Anemia in chronic kidney disease   Chronic kidney disease (CKD), stage III (moderate)   1)Rt Greater Trochanteric Fracture-without displacement. PeriProsthetic,  surgeon advised weightbearing as tolerated.  Rolling walker without active abduction for 6 wks. Phys therapy evaluation pending Social work consulted for rehab    2)Anemia- stable,  Cont erythropoietin replacing agent by Dr Alvy Bimler. Continue iron sulfate  3)CAD- prior 5 vessel CABG,  asymptomatic at this time, continue aspirin and metoprolol . LDL is not at goal, (LDL 235), some problems with compliance, admitting physician increase Lipitor to 80 mg daily, watch for myalgias and other signs or symptoms of rhabdo.  4)DM- good control, recent A1c 6.4, Allow  some permissive Hyperglycemia rather than risk life-threatening hypoglycemia in a patient with unreliable oral intake. Use Novolog/Humalog Sliding scale insulin with Accu-Cheks/Fingersticks as ordered Glucotrol and Actos on hold   Code Status : FULL CODE  Family Communication  :  w patient  Disposition Plan  : SNF  Barriers For Discharge :   Consults  :  orthopedics  Procedures  :   DVT Prophylaxis  :  Lovenox - SCDs  Lab Results  Component Value Date   PLT 138 (L) 05/15/2016    Antibiotics  :    Anti-infectives    None        Objective:   Vitals:   05/15/16 0406 05/15/16 1420 05/15/16 1931 05/16/16 0645  BP: (!) 166/92 (!) 152/55 (!) 173/50 (!) 146/47  Pulse: (!) 57 63 67 (!) 46  Resp: 16     Temp: 98 F (36.7 C) 98.1 F (36.7 C) 97.4 F (36.3 C) 97.7 F (36.5 C)  TempSrc: Oral Oral Oral Axillary  SpO2: 98% 96% 95% 95%  Weight:      Height:        Wt Readings from Last 3 Encounters:  05/14/16 84.6 kg (186 lb 9.6 oz)  05/12/16 84.4 kg (186 lb)  05/06/16 83.9 kg (185 lb)     Intake/Output Summary (Last 24 hours) at 05/16/16 1023 Last data filed at 05/16/16 0700  Gross per 24 hour  Intake              120 ml  Output              650 ml  Net             -530 ml     Physical Exam  Awake Alert, Oriented X 3, No new F.N deficits, Normal affect [place].AT,PERRAL Supple Neck,No JVD, No cervical lymphadenopathy appriciated.  Symmetrical Chest wall movement, Good air movement bilaterally, CTAB RRR,No Gallops,Rubs or new Murmurs, No Parasternal Heave +ve B.Sounds, Abd Soft, No tenderness, No organomegaly appriciated, No rebound - guarding or rigidity. No Cyanosis, Clubbing or edema, No  new Rash or bruise      Data Review:    CBC  Recent Labs Lab 05/14/16 1658 05/15/16 0428  WBC  --  7.0  HGB 11.9* 9.8*  HCT 35.0* 30.3*  PLT  --  138*  MCV  --  91.3  MCH  --  29.5  MCHC  --  32.3  RDW  --  15.2    Chemistries   Recent Labs Lab 05/14/16 1658 05/15/16 0428  NA 138 138  K 4.2 3.9  CL 101 106  CO2  --  26  GLUCOSE 285* 119*  BUN 31* 24*  CREATININE 1.60* 1.28*  CALCIUM  --  8.9   ------------------------------------------------------------------------------------------------------------------ No results for input(s): CHOL, HDL, LDLCALC, TRIG, CHOLHDL, LDLDIRECT in the last 72 hours.  Lab Results  Component Value Date   HGBA1C 6.4 04/28/2016   ------------------------------------------------------------------------------------------------------------------ No results for input(s): TSH, T4TOTAL, T3FREE, THYROIDAB in the last 72 hours.  Invalid input(s): FREET3 ------------------------------------------------------------------------------------------------------------------ No results for input(s): VITAMINB12, FOLATE, FERRITIN, TIBC, IRON, RETICCTPCT in the last 72 hours.  Coagulation profile No results for input(s): INR, PROTIME in the last 168 hours.  No results for input(s): DDIMER in the last 72 hours.  Cardiac Enzymes No results for input(s): CKMB, TROPONINI, MYOGLOBIN in the last 168 hours.  Invalid input(s): CK ------------------------------------------------------------------------------------------------------------------    Component Value Date/Time   BNP 572.6 (H) 09/16/2015 1830  Inpatient Medications  Scheduled Meds: . aspirin  325 mg Oral Daily  . atorvastatin  80 mg Oral q1800  . enoxaparin (LOVENOX) injection  30 mg Subcutaneous Daily  . feeding supplement (GLUCERNA SHAKE)  237 mL Oral BID BM  . ferrous sulfate  325 mg Oral BID WC  . gabapentin  100 mg Oral TID  . insulin aspart  0-15 Units Subcutaneous TID WC  .  levothyroxine  50 mcg Oral QAC breakfast  . metoprolol succinate  25 mg Oral BID  . sertraline  25 mg Oral Daily   Continuous Infusions:  PRN Meds:.HYDROcodone-acetaminophen, HYDROmorphone (DILAUDID) injection, senna-docusate  Micro Results No results found for this or any previous visit (from the past 240 hour(s)).  Radiology Reports Ct Pelvis Wo Contrast  Result Date: 05/14/2016 CLINICAL DATA:  Status post fall with right hip pain. EXAM: CT PELVIS WITHOUT CONTRAST TECHNIQUE: Multidetector CT imaging of the pelvis was performed following the standard protocol without intravenous contrast. COMPARISON:  Plain films of earlier today and back to 09/03/2009. FINDINGS: Soft tissues: Abdominal aortic and branch vessel atherosclerosis. Status post pelvic node dissection. No sidewall hematoma. Normal caliber of pelvic bowel loops. Hysterectomy. No free pelvic fluid or extraluminal air. There is air within the urinary bladder. No subcutaneous hematoma. There is subcutaneous edema posterior to the right hip which is of indeterminate acuity. Example image 54/series 3 Bones: Mild osteopenia. Bilateral L5 pars defects with grade 1-2 L5-S1 anterolisthesis. Advanced degenerative disc disease at L3-4 through L5-S1. Status post long-stem right hip arthroplasty. Cortical irregularity is identified about the greater trochanter of the right femur, including on image 46/series 4. No callus deposition at this site. IMPRESSION: 1. Status post right hip arthroplasty. Cortical irregularity about the greater trochanter of the right femur. Given absence of callus deposition, this is favored to represent an acute minimally displaced fracture. Of note, this area is not well evaluated on the initial postoperative exam of 2010. 2. Advanced lumbosacral spondylosis with grade 1-2 L5-S1 anterolisthesis and bilateral L5 pars defects. 3.  Aortic atherosclerosis. 4. Air within the urinary bladder.  Correlate with instrumentation.  Electronically Signed   By: Abigail Miyamoto M.D.   On: 05/14/2016 20:40   Chest Portable 1 View  Result Date: 05/15/2016 CLINICAL DATA:  Hip right fx, pt has been NPO since midnight. No chest complaints this am. Non smoker. Diabetic. Htn. Hx bypass sx in 1979 per pt. EXAM: PORTABLE CHEST 1 VIEW COMPARISON:  10/28/2015 FINDINGS: Status post CABG surgery. Cardiac silhouette is mildly enlarged. There is a moderate size hiatal hernia, appearing smaller than on the prior exam. Mediastinal contours otherwise unremarkable. No hilar masses or adenopathy. Clear lungs.  No pleural effusion.  No pneumothorax. Bony thorax is demineralized but grossly intact. IMPRESSION: 1. No acute cardiopulmonary disease. Electronically Signed   By: Lajean Manes M.D.   On: 05/15/2016 07:57   Dg Hip Unilat With Pelvis 2-3 Views Right  Result Date: 05/14/2016 CLINICAL DATA:  Initial encounter for Fall from a bench onto concrete today. Pain in the right hip. Hx right hip replacment EXAM: DG HIP (WITH OR WITHOUT PELVIS) 2-3V RIGHT COMPARISON:  None. FINDINGS: Right hip arthroplasty. Vascular calcifications. Left femoral head located. Degenerative disc disease at L4-5 and L5-S1. Extensive surgical clips throughout the pelvis. No acute fracture. Cortical irregularity about the greater trochanter right femur is favored to be postoperative. IMPRESSION: No acute osseous abnormality. Electronically Signed   By: Abigail Miyamoto M.D.   On: 05/14/2016 17:49  Time Spent in minutes  30   Jani Gravel M.D on 05/16/2016 at 10:23 AM  Between 7am to 7pm - Pager - [phone]  After 7pm go to [email or web address] - password [facility]  [facility] -  Office  [phone]

## 2016-05-16 NOTE — Progress Notes (Signed)
Subjective: Pt is admitted after a fall and has a right peri prosthetic hip fracture.  Per Dr. Sid Falcon note yesterday, this is a stable fracture.  She can bear weight as tolerated.  She is being seen by PT this morning.  She c/o soreness of the right hip today that is worse with movement and better with rest.  Denies numbness, tingling or weakness of the R LE.  Objective: Vital signs in last 24 hours: Temp:  [97.4 F (36.3 C)-98.1 F (36.7 C)] 97.7 F (36.5 C) (08/20 0645) Pulse Rate:  [46-67] 46 (08/20 0645) BP: (146-173)/(47-55) 146/47 (08/20 0645) SpO2:  [95 %-96 %] 95 % (08/20 0645)  Intake/Output from previous day: 08/19 0701 - 08/20 0700 In: 120 [P.O.:120] Out: 650 [Urine:650] Intake/Output this shift: No intake/output data recorded.   Recent Labs  05/14/16 1658 05/15/16 0428  HGB 11.9* 9.8*    Recent Labs  05/14/16 1658 05/15/16 0428  WBC  --  7.0  RBC  --  3.32*  HCT 35.0* 30.3*  PLT  --  138*    Recent Labs  05/14/16 1658 05/15/16 0428  NA 138 138  K 4.2 3.9  CL 101 106  CO2  --  26  BUN 31* 24*  CREATININE 1.60* 1.28*  GLUCOSE 285* 119*  CALCIUM  --  8.9   No results for input(s): LABPT, INR in the last 72 hours.  PE:  wn wd woman in nad.  resp unlabored.  EOMI.  R LE 5/5 strength in PF and DF of the ankel and toes.  Sens to LT intact at the foot.  Pain with motion in IR and ER.     Assessment/Plan: R hip periprosthetic fracture - continue PT.  WBAT on R LE.  We'll continue to follow.   Wylene Simmer 05/16/2016, 9:25 AM

## 2016-05-16 NOTE — Evaluation (Signed)
Physical Therapy Evaluation Patient Details Name: [name] MRN: [id number] DOB: 09-20-1933 Today's Date: 05/16/2016   History of Present Illness  [name] is a 82 y.o. female with medical history significant of breast/uterine cancer, chronic anemia, HTN, and HLD presenting following a mechanical fall with resultant periprosthetic fracture right side (current decision is to treat conservatively without sx with WBAT and no active abduction)   Clinical Impression  Pt very motivated and eager for mobility, but limited by pain and fatigue.  Pt indicates she lives alone and her family members are teachers who are just returning back to work.  Feel pt would benefit from SNF level of care and therapies to maximize independence prior to returning home alone.  Will continue to follow.      Follow Up Recommendations SNF    Equipment Recommendations       Recommendations for Other Services       Precautions / Restrictions Precautions Precautions: Fall Precaution Comments: No active abduction Restrictions Weight Bearing Restrictions: Yes RLE Weight Bearing: Weight bearing as tolerated      Mobility  Bed Mobility Overal bed mobility: Needs Assistance Bed Mobility: Supine to Sit     Supine to sit: Min guard     General bed mobility comments: With [facility] near flat pt able to bring herself to EOB with increased time.    Transfers Overall transfer level: Needs assistance Equipment used: Rolling walker (2 wheeled) Transfers: Sit to/from Stand Sit to Stand: Min assist         General transfer comment: VCs for safe hand placement  Ambulation/Gait Ambulation/Gait assistance: Min assist Ambulation Distance (Feet): 15 Feet Assistive device: Rolling walker (2 wheeled) Gait Pattern/deviations: Step-through pattern;Decreased stride length;Antalgic     General Gait Details: pt able to ambulate in room, but limited by pain and fatigue.    Stairs             Wheelchair Mobility    Modified Rankin (Stroke Patients Only)       Balance Overall balance assessment: Needs assistance Sitting-balance support: No upper extremity supported;Feet supported Sitting balance-Leahy Scale: Fair     Standing balance support: Single extremity supported;Bilateral upper extremity supported;During functional activity Standing balance-Leahy Scale: Poor Standing balance comment: for the most part reliant on at least one UE on a supporting surface while in standing--intermittently she had both arms of a supported surface while she applied toothpaste to toothbrush                             Pertinent Vitals/Pain Pain Assessment: 0-10 Pain Score:  (2-10 throughout eval.) Pain Location: R Hip Pain Descriptors / Indicators: Aching;Grimacing;Guarding Pain Intervention(s): Monitored during session;Premedicated before session;Repositioned    Home Living Family/patient expects to be discharged to:: Skilled nursing facility   Available Help at Discharge: Family;Available PRN/intermittently             Additional Comments: pt indicates family members work as Tour manager and are returning back to work.      Prior Function Level of Independence: Independent         Comments: drives     Hand Dominance   Dominant Hand: Right    Extremity/Trunk Assessment   Upper Extremity Assessment: Defer to OT evaluation           Lower Extremity Assessment: RLE deficits/detail RLE Deficits / Details: AROM WFL, but strength limited by pain.  Sensation intact.    Cervical /  Trunk Assessment: Normal  Communication   Communication: No difficulties  Cognition Arousal/Alertness: Awake/alert Behavior During Therapy: WFL for tasks assessed/performed Overall Cognitive Status: Within Functional Limits for tasks assessed                      General Comments      Exercises        Assessment/Plan    PT Assessment Patient needs  continued PT services  PT Diagnosis Difficulty walking;Acute pain   PT Problem List Decreased strength;Decreased activity tolerance;Decreased balance;Decreased mobility;Decreased knowledge of use of DME;Pain  PT Treatment Interventions DME instruction;Gait training;Stair training;Functional mobility training;Therapeutic activities;Therapeutic exercise;Balance training;Patient/family education   PT Goals (Current goals can be found in the Care Plan section) Acute Rehab PT Goals Patient Stated Goal: to be able to go home PT Goal Formulation: With patient Time For Goal Achievement: 05/30/16 Potential to Achieve Goals: Good    Frequency Min 3X/week   Barriers to discharge Decreased caregiver support      Co-evaluation PT/OT/SLP Co-Evaluation/Treatment: Yes Reason for Co-Treatment: For patient/therapist safety PT goals addressed during session: Mobility/safety with mobility;Balance;Proper use of DME OT goals addressed during session: ADL's and self-care;Strengthening/ROM       End of Session Equipment Utilized During Treatment: Gait belt Activity Tolerance: Patient limited by fatigue;Patient limited by pain Patient left: in chair;with call bell/phone within reach;with chair alarm set Nurse Communication: Mobility status    Functional Assessment Tool Used: Clinical Judgement Functional Limitation: Mobility: Walking and moving around Mobility: Walking and Moving Around Current Status [phone]): At least 20 percent but less than 40 percent impaired, limited or restricted Mobility: Walking and Moving Around Goal Status [phone]): 0 percent impaired, limited or restricted    Time: 0852-0923 PT Time Calculation (min) (ACUTE ONLY): 31 min   Charges:   PT Evaluation $PT Eval Moderate Complexity: 1 Procedure     PT G Codes:   PT G-Codes **NOT FOR INPATIENT CLASS** Functional Assessment Tool Used: Clinical Judgement Functional Limitation: Mobility: Walking and moving around Mobility:  Walking and Moving Around Current Status [phone]): At least 20 percent but less than 40 percent impaired, limited or restricted Mobility: Walking and Moving Around Goal Status [phone]): 0 percent impaired, limited or restricted    Catarina Hartshorn, [place] [phone] 05/16/2016, 1:32 PM

## 2016-05-17 DIAGNOSIS — D649 Anemia, unspecified: Secondary | ICD-10-CM | POA: Diagnosis not present

## 2016-05-17 DIAGNOSIS — D631 Anemia in chronic kidney disease: Secondary | ICD-10-CM | POA: Diagnosis not present

## 2016-05-17 DIAGNOSIS — S72114D Nondisplaced fracture of greater trochanter of right femur, subsequent encounter for closed fracture with routine healing: Secondary | ICD-10-CM | POA: Diagnosis not present

## 2016-05-17 DIAGNOSIS — N184 Chronic kidney disease, stage 4 (severe): Secondary | ICD-10-CM | POA: Diagnosis not present

## 2016-05-17 DIAGNOSIS — M25559 Pain in unspecified hip: Secondary | ICD-10-CM | POA: Diagnosis not present

## 2016-05-17 DIAGNOSIS — I2510 Atherosclerotic heart disease of native coronary artery without angina pectoris: Secondary | ICD-10-CM | POA: Diagnosis not present

## 2016-05-17 DIAGNOSIS — Z853 Personal history of malignant neoplasm of breast: Secondary | ICD-10-CM | POA: Diagnosis not present

## 2016-05-17 DIAGNOSIS — S72009A Fracture of unspecified part of neck of unspecified femur, initial encounter for closed fracture: Secondary | ICD-10-CM | POA: Diagnosis not present

## 2016-05-17 DIAGNOSIS — R300 Dysuria: Secondary | ICD-10-CM | POA: Diagnosis not present

## 2016-05-17 DIAGNOSIS — E039 Hypothyroidism, unspecified: Secondary | ICD-10-CM | POA: Diagnosis not present

## 2016-05-17 DIAGNOSIS — Z9181 History of falling: Secondary | ICD-10-CM | POA: Diagnosis not present

## 2016-05-17 DIAGNOSIS — M6281 Muscle weakness (generalized): Secondary | ICD-10-CM | POA: Diagnosis not present

## 2016-05-17 DIAGNOSIS — N179 Acute kidney failure, unspecified: Secondary | ICD-10-CM | POA: Diagnosis not present

## 2016-05-17 DIAGNOSIS — N189 Chronic kidney disease, unspecified: Secondary | ICD-10-CM

## 2016-05-17 DIAGNOSIS — Z9011 Acquired absence of right breast and nipple: Secondary | ICD-10-CM | POA: Diagnosis not present

## 2016-05-17 DIAGNOSIS — Z79899 Other long term (current) drug therapy: Secondary | ICD-10-CM | POA: Diagnosis not present

## 2016-05-17 DIAGNOSIS — W010XXA Fall on same level from slipping, tripping and stumbling without subsequent striking against object, initial encounter: Secondary | ICD-10-CM | POA: Diagnosis not present

## 2016-05-17 DIAGNOSIS — S32302D Unspecified fracture of left ilium, subsequent encounter for fracture with routine healing: Secondary | ICD-10-CM | POA: Diagnosis not present

## 2016-05-17 DIAGNOSIS — S72001D Fracture of unspecified part of neck of right femur, subsequent encounter for closed fracture with routine healing: Secondary | ICD-10-CM

## 2016-05-17 DIAGNOSIS — N183 Chronic kidney disease, stage 3 (moderate): Secondary | ICD-10-CM | POA: Diagnosis not present

## 2016-05-17 DIAGNOSIS — E1149 Type 2 diabetes mellitus with other diabetic neurological complication: Secondary | ICD-10-CM | POA: Diagnosis not present

## 2016-05-17 DIAGNOSIS — Z96641 Presence of right artificial hip joint: Secondary | ICD-10-CM | POA: Diagnosis not present

## 2016-05-17 DIAGNOSIS — E785 Hyperlipidemia, unspecified: Secondary | ICD-10-CM | POA: Diagnosis not present

## 2016-05-17 DIAGNOSIS — Z794 Long term (current) use of insulin: Secondary | ICD-10-CM | POA: Diagnosis not present

## 2016-05-17 DIAGNOSIS — E1143 Type 2 diabetes mellitus with diabetic autonomic (poly)neuropathy: Secondary | ICD-10-CM | POA: Diagnosis not present

## 2016-05-17 DIAGNOSIS — M9701XA Periprosthetic fracture around internal prosthetic right hip joint, initial encounter: Secondary | ICD-10-CM | POA: Diagnosis not present

## 2016-05-17 DIAGNOSIS — E1122 Type 2 diabetes mellitus with diabetic chronic kidney disease: Secondary | ICD-10-CM | POA: Diagnosis not present

## 2016-05-17 DIAGNOSIS — I252 Old myocardial infarction: Secondary | ICD-10-CM | POA: Diagnosis not present

## 2016-05-17 DIAGNOSIS — R531 Weakness: Secondary | ICD-10-CM | POA: Diagnosis not present

## 2016-05-17 DIAGNOSIS — Z7982 Long term (current) use of aspirin: Secondary | ICD-10-CM | POA: Diagnosis not present

## 2016-05-17 DIAGNOSIS — Z951 Presence of aortocoronary bypass graft: Secondary | ICD-10-CM | POA: Diagnosis not present

## 2016-05-17 DIAGNOSIS — E119 Type 2 diabetes mellitus without complications: Secondary | ICD-10-CM | POA: Diagnosis not present

## 2016-05-17 DIAGNOSIS — R262 Difficulty in walking, not elsewhere classified: Secondary | ICD-10-CM | POA: Diagnosis not present

## 2016-05-17 DIAGNOSIS — I129 Hypertensive chronic kidney disease with stage 1 through stage 4 chronic kidney disease, or unspecified chronic kidney disease: Secondary | ICD-10-CM | POA: Diagnosis not present

## 2016-05-17 LAB — COMPREHENSIVE METABOLIC PANEL
ALK PHOS: 72 U/L (ref 38–126)
ALT: 10 U/L — ABNORMAL LOW (ref 14–54)
ANION GAP: 10 (ref 5–15)
AST: 11 U/L — ABNORMAL LOW (ref 15–41)
Albumin: 2.7 g/dL — ABNORMAL LOW (ref 3.5–5.0)
BILIRUBIN TOTAL: 0.5 mg/dL (ref 0.3–1.2)
BUN: 33 mg/dL — ABNORMAL HIGH (ref 6–20)
CALCIUM: 8.9 mg/dL (ref 8.9–10.3)
CO2: 24 mmol/L (ref 22–32)
Chloride: 101 mmol/L (ref 101–111)
Creatinine, Ser: 1.5 mg/dL — ABNORMAL HIGH (ref 0.44–1.00)
GFR, EST AFRICAN AMERICAN: 36 mL/min — AB (ref >60)
GFR, EST NON AFRICAN AMERICAN: 31 mL/min — AB (ref >60)
Glucose, Bld: 189 mg/dL — ABNORMAL HIGH (ref 65–99)
Potassium: 4.5 mmol/L (ref 3.5–5.1)
SODIUM: 135 mmol/L (ref 135–145)
TOTAL PROTEIN: 5.6 g/dL — AB (ref 6.5–8.1)

## 2016-05-17 LAB — CBC
HCT: 31 % — ABNORMAL LOW (ref 36.0–46.0)
HEMOGLOBIN: 9.9 g/dL — AB (ref 12.0–15.0)
MCH: 29.6 pg (ref 26.0–34.0)
MCHC: 31.9 g/dL (ref 30.0–36.0)
MCV: 92.5 fL (ref 78.0–100.0)
Platelets: 140 10*3/uL — ABNORMAL LOW (ref 150–400)
RBC: 3.35 MIL/uL — ABNORMAL LOW (ref 3.87–5.11)
RDW: 15.4 % (ref 11.5–15.5)
WBC: 7.1 10*3/uL (ref 4.0–10.5)

## 2016-05-17 LAB — GLUCOSE, CAPILLARY
GLUCOSE-CAPILLARY: 221 mg/dL — AB (ref 65–99)
GLUCOSE-CAPILLARY: 189 mg/dL — AB (ref 65–99)
GLUCOSE-CAPILLARY: 192 mg/dL — AB (ref 65–99)
Glucose-Capillary: 185 mg/dL — ABNORMAL HIGH (ref 65–99)

## 2016-05-17 MED ORDER — INSULIN ASPART 100 UNIT/ML ~~LOC~~ SOLN: SUBCUTANEOUS | 11 refills | Status: DC

## 2016-05-17 MED ORDER — HYDROCODONE-ACETAMINOPHEN 5-325 MG PO TABS: 1.0000 | ORAL_TABLET | ORAL | 0 refills | Status: DC | PRN

## 2016-05-17 NOTE — NC FL2 (Signed)
[place] LEVEL OF CARE SCREENING TOOL     IDENTIFICATION  Patient Name: [name] Birthdate: [birth year] Sex: female Admission Date (Current Location): 05/14/2016  [facility] and [place] Number:  Herbalist and Address:  [facility], [place] [address], [place], [place] [address]      Provider Number: [id number]  Attending Physician Name and Address:  Hosie Poisson, MD  Relative Name and Phone Number:       Current Level of Care: Hospital Recommended Level of Care: [place] Prior Approval Number:    Date Approved/Denied:   PASRR Number: [id number]  Discharge Plan: SNF    Current Diagnoses: Patient Active Problem List   Diagnosis Date Noted  . Chronic kidney disease (CKD), stage III (moderate) 05/15/2016  . Hip fracture ([place]) 05/14/2016  . Right bundle branch block 11/19/2015  . History of breast cancer 10/02/2015  . History of uterine cancer 10/02/2015  . Anemia in chronic kidney disease 10/02/2015  . Carotid stenosis 09/26/2015  . Acute kidney injury superimposed on chronic kidney disease ([place])   . Hyperlipidemia LDL goal <70   . Depression, major, single episode, mild ([place]) 10/22/2014  . Diabetic peripheral autonomic neuropathy ([place]) 12/04/2013  . Vitamin D deficiency 08/03/2012  . Diabetes mellitus with neurological manifestation ([place]) 01/26/2011  . Hypothyroidism 01/26/2011  . Essential hypertension 01/26/2011  . CAD (coronary artery disease) 01/26/2011    Orientation RESPIRATION BLADDER Height & Weight     Self, Time, Situation, Place  Normal Continent Weight: 186 lb 9.6 oz (84.6 kg) Height:  5\' 3"  (160 cm)  BEHAVIORAL SYMPTOMS/MOOD NEUROLOGICAL BOWEL NUTRITION STATUS      Continent Diet (Carb modified)  AMBULATORY STATUS COMMUNICATION OF NEEDS Skin   Extensive Assist Verbally Normal                       Personal Care Assistance Level of Assistance  Bathing, Dressing Bathing  Assistance: Limited assistance   Dressing Assistance: Limited assistance     Functional Limitations Info             SPECIAL CARE FACTORS FREQUENCY  PT (By licensed PT), OT (By licensed OT)     PT Frequency: daily OT Frequency: daily            Contractures Contractures Info: Not present    Additional Factors Info  Allergies   Allergies Info: Levofloxacin, Morphine And Related, Tape           Current Medications (05/17/2016):  This is the current hospital active medication list Current Facility-Administered Medications  Medication Dose Route Frequency Provider Last Rate Last Dose  . acetaminophen (TYLENOL) tablet 650 mg  650 mg Oral Q6H PRN Jani Gravel, MD   650 mg at 05/16/16 1532  . aspirin tablet 325 mg  325 mg Oral Daily Karmen Bongo, MD   325 mg at 05/16/16 2138  . atorvastatin (LIPITOR) tablet 80 mg  80 mg Oral q1800 Karmen Bongo, MD   80 mg at 05/16/16 1723  . enoxaparin (LOVENOX) injection 30 mg  30 mg Subcutaneous Daily Karmen Bongo, MD   30 mg at 05/16/16 1031  . feeding supplement (GLUCERNA SHAKE) (GLUCERNA SHAKE) liquid 237 mL  237 mL Oral BID BM Roxan Hockey, MD   237 mL at 05/16/16 1717  . ferrous sulfate tablet 325 mg  325 mg Oral BID WC Karmen Bongo, MD   325 mg at 05/16/16 1723  . gabapentin (  NEURONTIN) capsule 100 mg  100 mg Oral TID Karmen Bongo, MD   100 mg at 05/16/16 2138  . hydrALAZINE (APRESOLINE) injection 5 mg  5 mg Intravenous Q6H PRN Jani Gravel, MD      . HYDROcodone-acetaminophen (NORCO/VICODIN) 5-325 MG per tablet 1-2 tablet  1-2 tablet Oral Q6H PRN Karmen Bongo, MD   2 tablet at 05/16/16 1901  . HYDROmorphone (DILAUDID) injection 0.5 mg  0.5 mg Intravenous Q2H PRN Karmen Bongo, MD      . insulin aspart (novoLOG) injection 0-15 Units  0-15 Units Subcutaneous TID WC Karmen Bongo, MD   5 Units at 05/16/16 1718  . levothyroxine (SYNTHROID, LEVOTHROID) tablet 50 mcg  50 mcg Oral QAC breakfast Karmen Bongo, MD   50 mcg at  05/16/16 0813  . metoprolol succinate (TOPROL-XL) 24 hr tablet 25 mg  25 mg Oral BID Karmen Bongo, MD   25 mg at 05/16/16 2138  . senna-docusate (Senokot-S) tablet 1 tablet  1 tablet Oral QHS PRN Karmen Bongo, MD      . sertraline (ZOLOFT) tablet 25 mg  25 mg Oral Daily Karmen Bongo, MD   25 mg at 05/16/16 1031     Discharge Medications: Please see discharge summary for a list of discharge medications.  Relevant Imaging Results:  Relevant Lab Results:   Additional Information SSN: [id number]  Dulcy Fanny, LCSW

## 2016-05-17 NOTE — Clinical Social Work Placement (Signed)
   CLINICAL SOCIAL WORK PLACEMENT  NOTE  Date:  05/17/2016  Patient Details  Name: [name] MRN: [id number] Date of Birth: 26-Jul-1933  Clinical Social Work is seeking post-discharge placement for this patient at the [place] level of care (*CSW will initial, date and re-position this form in  chart as items are completed):  Yes   Patient/family provided with [place] Work Department's list of facilities offering this level of care within the geographic area requested by the patient (or if unable, by the patient's family).  Yes   Patient/family informed of their freedom to choose among providers that offer the needed level of care, that participate in Medicare, Medicaid or managed care program needed by the patient, have an available bed and are willing to accept the patient.  Yes   Patient/family informed of [facility]'s ownership interest in [facility] and [facility], as well as of the fact that they are under no obligation to receive care at these facilities.  PASRR submitted to EDS on 05/17/16     PASRR number received on 05/17/16     Existing PASRR number confirmed on       FL2 transmitted to all facilities in geographic area requested by pt/family on 05/17/16     FL2 transmitted to all facilities within larger geographic area on       Patient informed that his/her managed care company has contracts with or will negotiate with certain facilities, including the following:        Yes   Patient/family informed of bed offers received.  Patient chooses bed at [facility]     Physician recommends and patient chooses bed at      Patient to be transferred to [facility] on 05/17/16.  Patient to be transferred to facility by PTAR     Patient family notified on 05/17/16 of transfer.  Name of family member notified:  daugther Santiago Glad at bedside     PHYSICIAN Please prepare prescriptions, Please sign FL2     Additional  Comment:    _______________________________________________ Dulcy Fanny, LCSW 05/17/2016, 3:03 PM

## 2016-05-17 NOTE — Progress Notes (Signed)
Pt was discharged to [facility] SNF. RN gave report to [facility] staff nurse. Maryn Freelove O Neida Ellegood  9:33 PM 05/17/2016

## 2016-05-17 NOTE — Clinical Social Work Placement (Signed)
   CLINICAL SOCIAL WORK PLACEMENT  NOTE  Date:  05/17/2016  Patient Details  Name: [name] MRN: [id number] Date of Birth: [birth year]  Clinical Social Work is seeking post-discharge placement for this patient at the [place] level of care (*CSW will initial, date and re-position this form in  chart as items are completed):  Yes   Patient/family provided with [place] Work Department's list of facilities offering this level of care within the geographic area requested by the patient (or if unable, by the patient's family).  Yes   Patient/family informed of their freedom to choose among providers that offer the needed level of care, that participate in Medicare, Medicaid or managed care program needed by the patient, have an available bed and are willing to accept the patient.  Yes   Patient/family informed of [facility]'s ownership interest in [facility] and [facility], as well as of the fact that they are under no obligation to receive care at these facilities.  PASRR submitted to EDS on 05/17/16     PASRR number received on 05/17/16     Existing PASRR number confirmed on       FL2 transmitted to all facilities in geographic area requested by pt/family on 05/17/16     FL2 transmitted to all facilities within larger geographic area on       Patient informed that his/her managed care company has contracts with or will negotiate with certain facilities, including the following:        Yes   Patient/family informed of bed offers received.  Patient chooses bed at [place], [place]     Physician recommends and patient chooses bed at      Patient to be transferred to [facility] on 05/17/16.  Patient to be transferred to facility by PTAR     Patient family notified on 05/17/16 of transfer.  Name of family member notified:  daugther Santiago Glad at bedside     PHYSICIAN Please prepare prescriptions, Please sign FL2      Additional Comment:    _______________________________________________ Dulcy Fanny, LCSW 05/17/2016, 10:33 AM

## 2016-05-17 NOTE — Progress Notes (Signed)
Physical Therapy Treatment Patient Details Name: [name] MRN: [id number] DOB: 08-[name]-1933 Today's Date: 05/17/2016    History of Present Illness [name] is a 82 y.o. female with medical history significant of breast/uterine cancer, chronic anemia, HTN, and HLD presenting following a mechanical fall with resultant periprosthetic fracture right side (current decision is to treat conservatively without sx with WBAT and no active abduction)     PT Comments    Pt able to increase her ambulation to 30 feet with rw. Pt with reports of increasing pain and fatigue during session. Recommending SNF for further rehabilitation following acute stay.   Follow Up Recommendations  SNF     Equipment Recommendations   (to be addressed at next venue)    Recommendations for Other Services       Precautions / Restrictions Precautions Precautions: Fall Restrictions Weight Bearing Restrictions: Yes RLE Weight Bearing: Weight bearing as tolerated    Mobility  Bed Mobility Overal bed mobility: Needs Assistance Bed Mobility: Supine to Sit     Supine to sit: Supervision     General bed mobility comments: using rail to assist to sitting EOB  Transfers Overall transfer level: Needs assistance Equipment used: Rolling walker (2 wheeled) Transfers: Sit to/from Stand Sit to Stand: Min assist         General transfer comment: cues for hand placement and full turn prior to sitting.   Ambulation/Gait Ambulation/Gait assistance: Min guard Ambulation Distance (Feet): 30 Feet Assistive device: Rolling walker (2 wheeled) Gait Pattern/deviations: Step-through pattern;Decreased weight shift to right Gait velocity: decreased   General Gait Details: increasing pain toward end of ambulation.    Stairs            Wheelchair Mobility    Modified Rankin (Stroke Patients Only)       Balance Overall balance assessment: Needs assistance Sitting-balance support: No upper  extremity supported Sitting balance-Leahy Scale: Good     Standing balance support: Bilateral upper extremity supported Standing balance-Leahy Scale: Poor Standing balance comment: using rw                    Cognition Arousal/Alertness: Awake/alert Behavior During Therapy: WFL for tasks assessed/performed Overall Cognitive Status: Within Functional Limits for tasks assessed                      Exercises      General Comments        Pertinent Vitals/Pain Pain Assessment: Faces Faces Pain Scale: Hurts little more Pain Location: Rt hip Pain Descriptors / Indicators: Sore Pain Intervention(s): Limited activity within patient's tolerance;Monitored during session    Home Living                      Prior Function            PT Goals (current goals can now be found in the care plan section) Acute Rehab PT Goals Patient Stated Goal: get hip to heal PT Goal Formulation: With patient Time For Goal Achievement: 05/30/16 Potential to Achieve Goals: Good Progress towards PT goals: Progressing toward goals    Frequency  Min 3X/week    PT Plan Current plan remains appropriate    Co-evaluation             End of Session Equipment Utilized During Treatment: Gait belt Activity Tolerance: Patient limited by fatigue;Patient limited by pain Patient left: in chair;with call bell/phone within reach;with family/visitor present  Time: 1008-1030 PT Time Calculation (min) (ACUTE ONLY): 22 min  Charges:  $Gait Training: 8-22 mins                    G Codes:      [name], PT, CSCS Pager [phone] Office [phone]  05/17/2016, 10:35 AM

## 2016-05-17 NOTE — Clinical Social Work Note (Signed)
Clinical Social Work Assessment  Patient Details  Name: [name] MRN: [id number] Date of Birth: 1933/01/08  Date of referral:  05/17/16               Reason for consult:  Facility Placement                Permission sought to share information with:  Facility Sport and exercise psychologist, Family Supports Permission granted to share information::  Yes, Verbal Permission Granted  Name::      Santiago Glad, daughter)  Agency::   [facility] SNFs with preference for [facility] in [place])  Relationship::     Contact Information:     Housing/Transportation Living arrangements for the past 2 months:  [place] of Information:  Patient Patient Interpreter Needed:  None Criminal Activity/Legal Involvement Pertinent to Current Situation/Hospitalization:  No - Comment as needed Significant Relationships:  Adult Children Lives with:  Self Do you feel safe going back to the place where you live?  No Need for family participation in patient care:  Yes (Comment) (patient request)  Care giving concerns:  Daughter is concerned for proximity to home from [facility]   Social Worker assessment / plan:  CSW met with patient and patient's daughter at bedside to review disposition plans.  Daughter, Santiago Glad, states she resides in [place] and will be the main caretaker for patient at time of discharge.  Patient and daughter, Santiago Glad wish for CSW to seek placement in [place] area with first preference given to [facility] in [place].  CSW made appropriate referrals and is awaiting bed offers which will be presented at time of receipt.  Patient was a non-operative hip fracture and will need PTAR transportation at time of discharge.  Patient and family aware and agreeable to MD plan for dc.  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    Patient/Family's Understanding of and Emotional Response to Diagnosis, Current Treatment, and Prognosis:  Patient and daughter are both very realistic regarding needed therapies at time of discharge and needs post STR.  Both offered realistic questions and were receptive to the recovery process.  Emotional Assessment Appearance:  Appears stated age Attitude/Demeanor/Rapport:    Affect (typically observed):  Accepting, Adaptable Orientation:  Oriented to Self, Oriented to Place, Oriented to  Time, Oriented to Situation Alcohol / Substance use:    Psych involvement (Current and /or in the community):     Discharge Needs  Concerns to be addressed:  No discharge needs identified Readmission within the last 30 days:  No Current discharge risk:  None Barriers to Discharge:  Continued Medical Work up   [employer], LCSW 05/17/2016, 10:34 AM

## 2016-05-17 NOTE — Care Management Obs Status (Signed)
[place] NOTIFICATION   Patient Details  Name: [name] MRN: [id number] Date of Birth: 07-22-1933   Medicare Observation Status Notification Given:  Yes    Ninfa Meeker, RN 05/17/2016, 1:40 PM

## 2016-05-17 NOTE — Discharge Instructions (Signed)
Weight Bearing as Tolerated Right Leg with a Walker NO ACTIVE ABDUCTION for 6 weeks

## 2016-05-17 NOTE — Discharge Summary (Signed)
Physician Discharge Summary  [name] [id number] DOB: 1933/01/06 DOA: 05/14/2016  PCP: Eliezer Lofts, MD  Admit date: 05/14/2016 Discharge date: 05/17/2016  Admitted From: [facility]. Disposition:  SNF  Recommendations for Outpatient Follow-up:  Follow up with PCP in 1-2 weeks Please obtain BMP/CBC in one week  Please follow up with orthopedics as recommended.  We have stopped cozaar and triamterene hctz because of renal parameters.  Please re check renal function in one week before resuming your medications. You will need a referral for geriatric specialist.     Discharge [place].  CODE STATUS: full code.  Diet recommendation: Heart Healthy   Brief/Interim Summary: 82 y.o.femalewith medical history significant of breast/uterine cancer, chronic anemia, HTN, and HLD presenting following a mechanical fall. The ED patient is found to have right greater trochanter fracture that is not displaced it is around herprevious hip prosthesis  Discharge Diagnoses:  Principal Problem:   Hip fracture ([place]) Active Problems:   Diabetes mellitus with neurological manifestation ([place])   Hypothyroidism   Acute kidney injury superimposed on chronic kidney disease ([place])   Hyperlipidemia LDL goal <70   Anemia in chronic kidney disease   Chronic kidney disease (CKD), stage III (moderate)  Rt Greater Trochanteric Fracture-without displacement. PeriProsthetic,  surgeon advised weightbearing as tolerated.  Rolling walker without activeabduction for 6 wks. Physicaltherapy evaluation recommending SNF. Social work consulted for rehab    2)Anemia- stable,  Cont erythropoietin replacing agent by Dr Alvy Bimler. Continue iron sulfate.  3)CAD- prior 5 vessel CABG, asymptomatic at this time, continue aspirin and metoprolol . LDL is not at goal, (LDL 235), some problems with compliance, admitting physician increase Lipitor to 80 mg daily, watch for myalgias and other signs or symptoms of  rhabdo.  4)DM- good control, recent A1c 6.4, Allow some permissive Hyperglycemia rather than risk life-threatening hypoglycemia in a patient with unreliable oral intake.  ssi  5. STAGE 3 CKD: Holding the cozaar and diuretic, check renal parameters in one week and if stable can resume them.   Discharge Instructions  Discharge Instructions    Diet - low sodium heart healthy    Complete by:  As directed   Discharge instructions    Complete by:  As directed   Follow up with PCP with in one week.  Please follow up with orthopedics as recommended.  We have stopped cozaar and triamterene hctz because of renal parameters.  Please re check renal function in one week before resuming your medications.   Weight bearing as tolerated    Complete by:  As directed   No active abduction right hip   Laterality:  right   Extremity:  Lower       Medication List    STOP taking these medications   glipiZIDE 5 MG 24 hr tablet Commonly known as:  GLUCOTROL XL   losartan 50 MG tablet Commonly known as:  COZAAR   pioglitazone 30 MG tablet Commonly known as:  ACTOS   triamterene-hydrochlorothiazide 37.5-25 MG capsule Commonly known as:  DYAZIDE     TAKE these medications   ACCU-CHEK AVIVA device Use to check blood sugar once daily.  Dx: E11.43   accu-chek soft touch lancets Use to check blood sugar once daily.  Dx: E11.43   accu-chek softclix lancets Use to check blood sugar once daily.  Dx: E11.43   aspirin 81 MG tablet Take 81 mg by mouth daily.   atorvastatin 40 MG tablet Commonly known as:  LIPITOR Take 1 tablet (40 mg total) by  mouth daily.   cyanocobalamin 1000 MCG tablet Take 1,000 mcg by mouth daily. Reported on 09/11/2015   gabapentin 100 MG capsule Commonly known as:  NEURONTIN TAKE ONE CAPSULE 3 TIMES A DAY What changed:  See the new instructions.   glucose blood test strip Commonly known as:  ACCU-CHEK AVIVA Use to check blood sugar once daily.  Dx: E11.43    HYDROcodone-acetaminophen 5-325 MG tablet Commonly known as:  NORCO/VICODIN Take 1 tablet by mouth every 4 (four) hours as needed for moderate pain.   insulin aspart 100 UNIT/ML injection Commonly known as:  novoLOG CBG 70 - 120: 0 units CBG 121 - 150: 2 units CBG 151 - 200: 3 units CBG 201 - 250: 5 units CBG 251 - 300: 8 units CBG 301 - 350: 11 units CBG 351 - 400: 15 units   Iron 325 (65 Fe) MG Tabs Take 1 tablet by mouth 2 (two) times daily. Reported on 09/11/2015   levothyroxine 50 MCG tablet Commonly known as:  SYNTHROID, LEVOTHROID TAKE 1 TABLET BY MOUTH EVERY DAY   metoprolol succinate 25 MG 24 hr tablet Commonly known as:  TOPROL-XL Take 25 mg by mouth 2 (two) times daily.   nitroGLYCERIN 0.4 MG SL tablet Commonly known as:  NITROSTAT Place 1 tablet (0.4 mg total) under the tongue every 5 (five) minutes as needed for chest pain (x 3 tabs daily).   ondansetron 4 MG tablet Commonly known as:  ZOFRAN Take 1 tablet (4 mg total) by mouth every 8 (eight) hours as needed.   sertraline 25 MG tablet Commonly known as:  ZOLOFT TAKE 1 TABLET (25 MG TOTAL) BY MOUTH DAILY.   UNABLE TO FIND Darbepoetin Alfa- once every other month at [facility] (Ergocalciferol) 50000 units Caps capsule Commonly known as:  DRISDOL TAKE 1 CAPSULE BY MOUTH EVERY 7 DAYS.      Follow-up Information    Schedule an appointment as soon as possible for a visit today with Eliezer Lofts, MD.   Specialty:  Family Medicine Why:  To have your kidney labs checked again in a week. Contact information: [place] [address] [phone]        Swinteck, Horald Pollen, MD. Schedule an appointment as soon as possible for a visit in 2 week(s).   Specialty:  Orthopedic Surgery Contact information: [address]. [address] [place] [address] [id number]        Tobi Bastos, MD Follow up in 2 week(s).   Specialty:  Orthopedic Surgery Contact  information: [address] [place] [address] [phone]          Allergies  Allergen Reactions  . Levofloxacin Nausea And Vomiting  . Morphine And Related Other (See Comments)    BAD HEADACHE  . Tape Rash    Consultations: Orthopedics.   Procedures/Studies: Ct Pelvis Wo Contrast  Result Date: 05/14/2016 CLINICAL DATA:  Status post fall with right hip pain. EXAM: CT PELVIS WITHOUT CONTRAST TECHNIQUE: Multidetector CT imaging of the pelvis was performed following the standard protocol without intravenous contrast. COMPARISON:  Plain films of earlier today and back to 09/03/2009. FINDINGS: Soft tissues: Abdominal aortic and branch vessel atherosclerosis. Status post pelvic node dissection. No sidewall hematoma. Normal caliber of pelvic bowel loops. Hysterectomy. No free pelvic fluid or extraluminal air. There is air within the urinary bladder. No subcutaneous hematoma. There is subcutaneous edema posterior to the right hip which is of indeterminate acuity. Example image  54/series 3 Bones: Mild osteopenia. Bilateral L5 pars defects with grade 1-2 L5-S1 anterolisthesis. Advanced degenerative disc disease at L3-4 through L5-S1. Status post long-stem right hip arthroplasty. Cortical irregularity is identified about the greater trochanter of the right femur, including on image 46/series 4. No callus deposition at this site. IMPRESSION: 1. Status post right hip arthroplasty. Cortical irregularity about the greater trochanter of the right femur. Given absence of callus deposition, this is favored to represent an acute minimally displaced fracture. Of note, this area is not well evaluated on the initial postoperative exam of 2010. 2. Advanced lumbosacral spondylosis with grade 1-2 L5-S1 anterolisthesis and bilateral L5 pars defects. 3.  Aortic atherosclerosis. 4. Air within the urinary bladder.  Correlate with instrumentation. Electronically Signed   By: Abigail Miyamoto M.D.   On:  05/14/2016 20:40   Chest Portable 1 View  Result Date: 05/15/2016 CLINICAL DATA:  Hip right fx, pt has been NPO since midnight. No chest complaints this am. Non smoker. Diabetic. Htn. Hx bypass sx in 1979 per pt. EXAM: PORTABLE CHEST 1 VIEW COMPARISON:  10/28/2015 FINDINGS: Status post CABG surgery. Cardiac silhouette is mildly enlarged. There is a moderate size hiatal hernia, appearing smaller than on the prior exam. Mediastinal contours otherwise unremarkable. No hilar masses or adenopathy. Clear lungs.  No pleural effusion.  No pneumothorax. Bony thorax is demineralized but grossly intact. IMPRESSION: 1. No acute cardiopulmonary disease. Electronically Signed   By: Lajean Manes M.D.   On: 05/15/2016 07:57   Dg Hip Unilat With Pelvis 2-3 Views Right  Result Date: 05/14/2016 CLINICAL DATA:  Initial encounter for Fall from a bench onto concrete today. Pain in the right hip. Hx right hip replacment EXAM: DG HIP (WITH OR WITHOUT PELVIS) 2-3V RIGHT COMPARISON:  None. FINDINGS: Right hip arthroplasty. Vascular calcifications. Left femoral head located. Degenerative disc disease at L4-5 and L5-S1. Extensive surgical clips throughout the pelvis. No acute fracture. Cortical irregularity about the greater trochanter right femur is favored to be postoperative. IMPRESSION: No acute osseous abnormality. Electronically Signed   By: Abigail Miyamoto M.D.   On: 05/14/2016 17:49       Subjective: Pain controlled.   Discharge Exam: Vitals:   05/16/16 2125 05/17/16 0537  BP: (!) 128/35 (!) 160/49  Pulse: 62 (!) 52  Resp:    Temp: 97.8 F (36.6 C) 97.8 F (36.6 C)   Vitals:   05/16/16 0645 05/16/16 1430 05/16/16 2125 05/17/16 0537  BP: (!) 146/47 (!) 169/47 (!) 128/35 (!) 160/49  Pulse: (!) 46 (!) 56 62 (!) 52  Resp:  17    Temp: 97.7 F (36.5 C) 97.5 F (36.4 C) 97.8 F (36.6 C) 97.8 F (36.6 C)  TempSrc: Axillary Oral Oral Oral  SpO2: 95% 97% 95% 97%  Weight:      Height:        General: Pt  is alert, awake, not in acute distress Cardiovascular: RRR, S1/S2 +, no rubs, no gallops Respiratory: CTA bilaterally, no wheezing, no rhonchi Abdominal: Soft, NT, ND, bowel sounds + Extremities: no edema, no cyanosis    The results of significant diagnostics from this hospitalization (including imaging, microbiology, ancillary and laboratory) are listed below for reference.     Microbiology: No results found for this or any previous visit (from the past 240 hour(s)).   Labs: BNP (last 3 results)  Recent Labs  09/16/15 1830  BNP 123456*   Basic Metabolic Panel:  Recent Labs Lab 05/14/16 1658 05/15/16 0428 05/17/16 0534  NA 138 138 135  K 4.2 3.9 4.5  CL 101 106 101  CO2  --  26 24  GLUCOSE 285* 119* 189*  BUN 31* 24* 33*  CREATININE 1.60* 1.28* 1.50*  CALCIUM  --  8.9 8.9   Liver Function Tests:  Recent Labs Lab 05/17/16 0534  AST 11*  ALT 10*  ALKPHOS 72  BILITOT 0.5  PROT 5.6*  ALBUMIN 2.7*   No results for input(s): LIPASE, AMYLASE in the last 168 hours. No results for input(s): AMMONIA in the last 168 hours. CBC:  Recent Labs Lab 05/14/16 1658 05/15/16 0428 05/17/16 0534  WBC  --  7.0 7.1  HGB 11.9* 9.8* 9.9*  HCT 35.0* 30.3* 31.0*  MCV  --  91.3 92.5  PLT  --  138* 140*   Cardiac Enzymes: No results for input(s): CKTOTAL, CKMB, CKMBINDEX, TROPONINI in the last 168 hours. BNP: Invalid input(s): POCBNP CBG:  Recent Labs Lab 05/16/16 1202 05/16/16 1700 05/16/16 2128 05/17/16 0542 05/17/16 1055  GLUCAP 166* 221* 191* 189* 192*   D-Dimer No results for input(s): DDIMER in the last 72 hours. Hgb A1c No results for input(s): HGBA1C in the last 72 hours. Lipid Profile No results for input(s): CHOL, HDL, LDLCALC, TRIG, CHOLHDL, LDLDIRECT in the last 72 hours. Thyroid function studies No results for input(s): TSH, T4TOTAL, T3FREE, THYROIDAB in the last 72 hours.  Invalid input(s): FREET3 Anemia work up No results for input(s):  VITAMINB12, FOLATE, FERRITIN, TIBC, IRON, RETICCTPCT in the last 72 hours. Urinalysis    Component Value Date/Time   COLORURINE YELLOW 09/15/2015 1432   APPEARANCEUR HAZY (A) 09/15/2015 1432   LABSPEC 1.022 09/15/2015 1432   PHURINE 5.5 09/15/2015 1432   GLUCOSEU 100 (A) 09/15/2015 1432   HGBUR LARGE (A) 09/15/2015 1432   BILIRUBINUR NEGATIVE 09/15/2015 1432   BILIRUBINUR negative 10/22/2014 1220   KETONESUR NEGATIVE 09/15/2015 1432   PROTEINUR >300 (A) 09/15/2015 1432   UROBILINOGEN 0.2 10/22/2014 1220   UROBILINOGEN 0.2 10/11/2014 1233   NITRITE NEGATIVE 09/15/2015 1432   LEUKOCYTESUR NEGATIVE 09/15/2015 1432   Sepsis Labs Invalid input(s): PROCALCITONIN,  WBC,  LACTICIDVEN Microbiology No results found for this or any previous visit (from the past 240 hour(s)).   Time coordinating discharge: Over 30 minutes  SIGNED:   Hosie Poisson, MD  [facility] 05/17/2016, 1:28 PM Pager   If 7PM-7AM, please contact night-coverage [email or web address] Password [email or web address]

## 2016-05-17 NOTE — Progress Notes (Signed)
Subjective:    Patient reports pain as 3 on 0-10 scale.  Ready for DC from Ortho standpoint. She desires SNF.  Objective: Vital signs in last 24 hours: Temp:  [97.5 F (36.4 C)-97.8 F (36.6 C)] 97.8 F (36.6 C) (08/21 0537) Pulse Rate:  [52-62] 52 (08/21 0537) Resp:  [17] 17 (08/20 1430) BP: (128-169)/(35-49) 160/49 (08/21 0537) SpO2:  [95 %-97 %] 97 % (08/21 0537)  Intake/Output from previous day: No intake/output data recorded. Intake/Output this shift: No intake/output data recorded.   Recent Labs  05/14/16 1658 05/15/16 0428 05/17/16 0534  HGB 11.9* 9.8* 9.9*    Recent Labs  05/15/16 0428 05/17/16 0534  WBC 7.0 7.1  RBC 3.32* 3.35*  HCT 30.3* 31.0*  PLT 138* 140*    Recent Labs  05/14/16 1658 05/15/16 0428  NA 138 138  K 4.2 3.9  CL 101 106  CO2  --  26  BUN 31* 24*  CREATININE 1.60* 1.28*  GLUCOSE 285* 119*  CALCIUM  --  8.9   No results for input(s): LABPT, INR in the last 72 hours.  Dorsiflexion/Plantar flexion intact  Assessment/Plan:    Up with therapy Discharge to SNF. DC PAIN meds written. She should be DC on Aspirin 325mg m two tmes a day. See me in the office in one week. She may put 50% weight on right leg.  [name] A 05/17/2016, 7:41 AM

## 2016-05-17 NOTE — Clinical Social Work Note (Signed)
Patient will discharge today per MD order. Patient will discharge to: [facility] RN to call report prior to transportation to: [phone] Transportation: [place] sent discharge summary to SNF for review. RN, patient and family aware of discharge plans.  Nonnie Done, LCSW ([id number]) [id number]  Licensed Clinical Social Worker

## 2016-05-19 DIAGNOSIS — M6281 Muscle weakness (generalized): Secondary | ICD-10-CM | POA: Diagnosis not present

## 2016-05-19 DIAGNOSIS — S32302D Unspecified fracture of left ilium, subsequent encounter for fracture with routine healing: Secondary | ICD-10-CM | POA: Diagnosis not present

## 2016-05-19 DIAGNOSIS — E785 Hyperlipidemia, unspecified: Secondary | ICD-10-CM | POA: Diagnosis not present

## 2016-05-19 DIAGNOSIS — E119 Type 2 diabetes mellitus without complications: Secondary | ICD-10-CM | POA: Diagnosis not present

## 2016-05-24 ENCOUNTER — Other Ambulatory Visit: Payer: Self-pay | Admitting: Family Medicine

## 2016-06-02 ENCOUNTER — Ambulatory Visit (HOSPITAL_BASED_OUTPATIENT_CLINIC_OR_DEPARTMENT_OTHER): Payer: Medicare Other

## 2016-06-02 ENCOUNTER — Telehealth: Payer: Self-pay | Admitting: *Deleted

## 2016-06-02 ENCOUNTER — Encounter: Payer: Self-pay | Admitting: Hematology and Oncology

## 2016-06-02 ENCOUNTER — Ambulatory Visit (HOSPITAL_BASED_OUTPATIENT_CLINIC_OR_DEPARTMENT_OTHER): Payer: Medicare Other | Admitting: Hematology and Oncology

## 2016-06-02 ENCOUNTER — Other Ambulatory Visit (HOSPITAL_BASED_OUTPATIENT_CLINIC_OR_DEPARTMENT_OTHER): Payer: Medicare Other

## 2016-06-02 ENCOUNTER — Telehealth: Payer: Self-pay | Admitting: Hematology and Oncology

## 2016-06-02 VITALS — BP 153/57 | HR 59 | Temp 98.4°F | Resp 18 | Wt 193.1 lb

## 2016-06-02 DIAGNOSIS — D509 Iron deficiency anemia, unspecified: Secondary | ICD-10-CM

## 2016-06-02 DIAGNOSIS — D539 Nutritional anemia, unspecified: Secondary | ICD-10-CM | POA: Insufficient documentation

## 2016-06-02 DIAGNOSIS — D631 Anemia in chronic kidney disease: Secondary | ICD-10-CM | POA: Diagnosis not present

## 2016-06-02 DIAGNOSIS — N179 Acute kidney failure, unspecified: Secondary | ICD-10-CM

## 2016-06-02 DIAGNOSIS — R0989 Other specified symptoms and signs involving the circulatory and respiratory systems: Secondary | ICD-10-CM

## 2016-06-02 DIAGNOSIS — R300 Dysuria: Secondary | ICD-10-CM

## 2016-06-02 DIAGNOSIS — N189 Chronic kidney disease, unspecified: Secondary | ICD-10-CM

## 2016-06-02 DIAGNOSIS — E8770 Fluid overload, unspecified: Secondary | ICD-10-CM | POA: Insufficient documentation

## 2016-06-02 DIAGNOSIS — S72001D Fracture of unspecified part of neck of right femur, subsequent encounter for closed fracture with routine healing: Secondary | ICD-10-CM

## 2016-06-02 DIAGNOSIS — I10 Essential (primary) hypertension: Secondary | ICD-10-CM

## 2016-06-02 DIAGNOSIS — E8779 Other fluid overload: Secondary | ICD-10-CM

## 2016-06-02 LAB — URINALYSIS, MICROSCOPIC - CHCC
Bilirubin (Urine): NEGATIVE
GLUCOSE UR CHCC: NEGATIVE mg/dL
KETONES: NEGATIVE mg/dL
Nitrite: POSITIVE
PROTEIN: 300 mg/dL
SPECIFIC GRAVITY, URINE: 1.02 (ref 1.003–1.035)
Urobilinogen, UR: 0.2 mg/dL (ref 0.2–1)
pH: 6 (ref 4.6–8.0)

## 2016-06-02 LAB — CBC & DIFF AND RETIC
BASO%: 0.5 % (ref 0.0–2.0)
BASOS ABS: 0 10*3/uL (ref 0.0–0.1)
EOS ABS: 0.3 10*3/uL (ref 0.0–0.5)
EOS%: 5.2 % (ref 0.0–7.0)
HEMATOCRIT: 26.3 % — AB (ref 34.8–46.6)
HEMOGLOBIN: 8.5 g/dL — AB (ref 11.6–15.9)
IMMATURE RETIC FRACT: 14.3 % — AB (ref 1.60–10.00)
LYMPH%: 17.5 % (ref 14.0–49.7)
MCH: 29 pg (ref 25.1–34.0)
MCHC: 32.3 g/dL (ref 31.5–36.0)
MCV: 89.8 fL (ref 79.5–101.0)
MONO#: 0.6 10*3/uL (ref 0.1–0.9)
MONO%: 9.3 % (ref 0.0–14.0)
NEUT#: 4.3 10*3/uL (ref 1.5–6.5)
NEUT%: 67.5 % (ref 38.4–76.8)
PLATELETS: 187 10*3/uL (ref 145–400)
RBC: 2.93 10*6/uL — AB (ref 3.70–5.45)
RDW: 16.2 % — ABNORMAL HIGH (ref 11.2–14.5)
RETIC CT ABS: 128.63 10*3/uL — AB (ref 33.70–90.70)
Retic %: 4.39 % — ABNORMAL HIGH (ref 0.70–2.10)
WBC: 6.3 10*3/uL (ref 3.9–10.3)
lymph#: 1.1 10*3/uL (ref 0.9–3.3)
nRBC: 0 % (ref 0–0)

## 2016-06-02 LAB — COMPREHENSIVE METABOLIC PANEL
ALBUMIN: 2.8 g/dL — AB (ref 3.5–5.0)
ALK PHOS: 126 U/L (ref 40–150)
ALT: 7 U/L (ref 0–55)
ANION GAP: 11 meq/L (ref 3–11)
AST: 12 U/L (ref 5–34)
BILIRUBIN TOTAL: 0.85 mg/dL (ref 0.20–1.20)
BUN: 17.4 mg/dL (ref 7.0–26.0)
CALCIUM: 9.2 mg/dL (ref 8.4–10.4)
CO2: 24 meq/L (ref 22–29)
CREATININE: 1.2 mg/dL — AB (ref 0.6–1.1)
Chloride: 107 mEq/L (ref 98–109)
EGFR: 44 mL/min/{1.73_m2} — ABNORMAL LOW (ref >90)
Glucose: 147 mg/dl — ABNORMAL HIGH (ref 70–140)
Potassium: 3.9 mEq/L (ref 3.5–5.1)
Sodium: 142 mEq/L (ref 136–145)
TOTAL PROTEIN: 6.3 g/dL — AB (ref 6.4–8.3)

## 2016-06-02 MED ORDER — HYDROCHLOROTHIAZIDE 12.5 MG PO CAPS: 12.5000 mg | ORAL_CAPSULE | Freq: Every day | ORAL | 0 refills | Status: DC

## 2016-06-02 MED ORDER — AMOXICILLIN 500 MG PO CAPS: 500.0000 mg | ORAL_CAPSULE | Freq: Two times a day (BID) | ORAL | 0 refills | Status: DC

## 2016-06-02 MED ORDER — DARBEPOETIN ALFA 200 MCG/0.4ML IJ SOSY
200.0000 ug | PREFILLED_SYRINGE | Freq: Once | INTRAMUSCULAR | Status: AC
  Administered 2016-06-02: 200 ug via SUBCUTANEOUS
  Filled 2016-06-02: qty 0.4

## 2016-06-02 NOTE — Assessment & Plan Note (Signed)
The patient has multifactorial anemia, likely due to anemia of chronic renal disease and recent hip injury. She may also have untreated urinary tract infection as a cause of her anemia. The patient denies any recent signs or symptoms of bleeding such as spontaneous epistaxis, hematuria or hematochezia. She is not symptomatic from anemia. I will proceed with darbepoetin today and recheck her blood work again in 2 weeks. If she starts to respond to treatment, I will space out her future appointment again

## 2016-06-02 NOTE — Telephone Encounter (Signed)
-----   Message from Heath Lark, MD sent at 06/02/2016  2:10 PM EDT ----- Regarding: labs Pls call daughter. Renal function is improved. I recommend she starts HCTZ as prescribed (I gave her the prescription to take) daily.  Prelim UA is suspicious for UTI. I recommend amoxicillin 500 mg BID PO X 7 days (please call this in) ----- Message ----- From: Interface, Lab In Three Zero One Sent: 06/02/2016   1:13 PM To: Heath Lark, MD

## 2016-06-02 NOTE — Assessment & Plan Note (Addendum)
She has worsening hypertension and fluid retention since discontinuation of 2 different blood pressure medications in the hospital. Repeat kidney function tests today show recovery of her kidney function. I recommend low-dose diuretic therapy with hydrochlorothiazide and I will recheck her blood work again in 2 weeks.

## 2016-06-02 NOTE — Progress Notes (Signed)
[place] OFFICE PROGRESS NOTE  Patient Care Team: Jinny Sanders, MD as PCP - General (Family Medicine)  SUMMARY OF ONCOLOGIC HISTORY:  [name] is here because of severe anemia. The patient have chronic anemia on and off for the past few years. She has undergone extensive evaluation in the past. Early 2015, she was found to have severe anemia with hemoglobin of 6.8 and underwent extensive evaluation and was found to have iron deficiency. She was placed on oral iron supplement which she takes 2 a day for a while. She also received blood transfusion in the past. She did not undergo repeat GI evaluation last year. Her last colonoscopy was in 2009. Recently, she was hospitalized for pneumonia between 09/15/2015 to 09/19/2015. She had acute mental status change and acute renal failure which improved with conservative management She had a low hemoglobin of 6.4 and received blood transfusion in the hospital. She received antibiotic for pneumonia. At present time, she complained of fatigue. She denies recent chest pain on exertion, shortness of breath on minimal exertion, pre-syncopal episodes, or palpitations. She had not noticed any recent bleeding such as epistaxis, hematuria or hematochezia The patient denies over the counter NSAID ingestion. She is on antiplatelets agents. Her last colonoscopy was in 2009. She does not recall having EGD She denies any pica and eats a variety of diet. She never donated blood She also have interesting history of right breast cancer, discovered when she was 89 and premenopausal. From her collection, she may have stage II disease due to regional lymph nodes involvement. She had mastectomy and complete lymph node dissection on the right axilla. She recalled receiving some form of chemotherapy but never received tamoxifen or radiation. In the 90s, she was diagnosed with uterine cancer due to abnormal postmenopausal bleeding. She had  complete hysterectomy but never received adjuvant treatment. On 10/02/2015, she received blood transfusion for severe anemia On 10/09/2015, she started on Aranesp injection The patient has nondisplaced right hip fracture in August and was hospitalized. That was managed conservatively  INTERVAL HISTORY: Please see below for problem oriented charting. She is here today with her daughter. She is sitting on the wheelchair. She is still on the skilled nursing facility, to be discharged within the next 72 hours after rehabilitation. She is still quite immobile due to right hip pain. The patient denies any recent signs or symptoms of bleeding such as spontaneous epistaxis, hematuria or hematochezia. She complained of fatigue. Denies chest pain or shortness breath. No recent cough. Her daughter noted she may have intermittent change in mental status. She has significant recent weight gain and bilateral lower extremity edema  REVIEW OF SYSTEMS:   Constitutional: Denies fevers, chills or abnormal weight loss Eyes: Denies blurriness of vision Ears, nose, mouth, throat, and face: Denies mucositis or sore throat Respiratory: Denies cough, dyspnea or wheezes Cardiovascular: Denies palpitation, chest discomfort  Gastrointestinal:  Denies nausea, heartburn or change in bowel habits Skin: Denies abnormal skin rashes Lymphatics: Denies new lymphadenopathy or easy bruising Neurological:Denies numbness, tingling or new weaknesses All other systems were reviewed with the patient and are negative.  I have reviewed the past medical history, past surgical history, social history and family history with the patient and they are unchanged from previous note.  ALLERGIES:  is allergic to levofloxacin; morphine and related; and tape.  MEDICATIONS:  Current Outpatient Prescriptions  Medication Sig Dispense Refill  . aspirin 81 MG tablet Take 81 mg by mouth daily.      Marland Kitchen  atorvastatin (LIPITOR) 40 MG tablet Take 1  tablet (40 mg total) by mouth daily. 90 tablet 3  . Blood Glucose Monitoring Suppl (ACCU-CHEK AVIVA) device Use to check blood sugar once daily.  Dx: E11.43 1 each 0  . cyanocobalamin 1000 MCG tablet Take 1,000 mcg by mouth daily. Reported on 09/11/2015    . Ferrous Sulfate (IRON) 325 (65 FE) MG TABS Take 1 tablet by mouth 2 (two) times daily. Reported on 09/11/2015    . gabapentin (NEURONTIN) 100 MG capsule TAKE ONE CAPSULE 3 TIMES A DAY (Patient taking differently: Take 100 mg by mouth three times a day) 90 capsule 3  . glucose blood (ACCU-CHEK AVIVA) test strip Use to check blood sugar once daily.  Dx: E11.43 100 each 3  . HYDROcodone-acetaminophen (NORCO/VICODIN) 5-325 MG tablet Take 1 tablet by mouth every 4 (four) hours as needed for moderate pain. 40 tablet 0  . insulin aspart (NOVOLOG) 100 UNIT/ML injection CBG 70 - 120: 0 units CBG 121 - 150: 2 units CBG 151 - 200: 3 units CBG 201 - 250: 5 units CBG 251 - 300: 8 units CBG 301 - 350: 11 units CBG 351 - 400: 15 units 10 mL 11  . Lancet Devices (ACCU-CHEK SOFTCLIX) lancets Use to check blood sugar once daily.  Dx: E11.43 1 each 0  . Lancets (ACCU-CHEK SOFT TOUCH) lancets Use to check blood sugar once daily.  Dx: E11.43 100 each 3  . levothyroxine (SYNTHROID, LEVOTHROID) 50 MCG tablet TAKE 1 TABLET BY MOUTH EVERY DAY 30 tablet 5  . metoprolol succinate (TOPROL-XL) 25 MG 24 hr tablet Take 25 mg by mouth 2 (two) times daily.    . nitroGLYCERIN (NITROSTAT) 0.4 MG SL tablet Place 1 tablet (0.4 mg total) under the tongue every 5 (five) minutes as needed for chest pain (x 3 tabs daily). 25 tablet 3  . ondansetron (ZOFRAN) 4 MG tablet Take 1 tablet (4 mg total) by mouth every 8 (eight) hours as needed. 20 tablet 0  . sertraline (ZOLOFT) 25 MG tablet TAKE 1 TABLET (25 MG TOTAL) BY MOUTH DAILY. 30 tablet 5  . UNABLE TO FIND Darbepoetin Alfa- once every other month at [facility]    . Vitamin D, Ergocalciferol, (DRISDOL) 50000 units CAPS capsule  TAKE 1 CAPSULE BY MOUTH EVERY 7 DAYS. 4 capsule 2  . amoxicillin (AMOXIL) 500 MG capsule Take 1 capsule (500 mg total) by mouth 2 (two) times daily. 14 capsule 0  . hydrochlorothiazide (MICROZIDE) 12.5 MG capsule Take 1 capsule (12.5 mg total) by mouth daily. 30 capsule 0   No current facility-administered medications for this visit.     PHYSICAL EXAMINATION: ECOG PERFORMANCE STATUS: 2 - Symptomatic, <50% confined to bed  Vitals:   06/02/16 1135  BP: (!) 153/57  Pulse: (!) 59  Resp: 18  Temp: 98.4 F (36.9 C)   Filed Weights   06/02/16 1135  Weight: 193 lb 1.6 oz (87.6 kg)    GENERAL:alert, no distress and comfortable SKIN: skin color Is pale, texture, turgor are normal, no rashes or significant lesions EYES: normal, Conjunctiva are pale and non-injected, sclera clear OROPHARYNX:no exudate, no erythema and lips, buccal mucosa, and tongue normal  NECK: supple, thyroid normal size, non-tender, without nodularity LYMPH:  no palpable lymphadenopathy in the cervical, axillary or inguinal LUNGS: She has normal work of breathing. Left lung crackles is noted HEART: regular rate & rhythm and no murmurs with moderate bilateral lower extremity edema ABDOMEN:abdomen soft, non-tender and normal bowel  sounds Musculoskeletal:no cyanosis of digits and no clubbing  NEURO: alert & oriented x 3 with fluent speech, no focal motor/sensory deficits  LABORATORY DATA:  I have reviewed the data as listed    Component Value Date/Time   NA 142 06/02/2016 1215   K 3.9 06/02/2016 1215   CL 101 05/17/2016 0534   CO2 24 06/02/2016 1215   GLUCOSE 147 (H) 06/02/2016 1215   BUN 17.4 06/02/2016 1215   CREATININE 1.2 (H) 06/02/2016 1215   CALCIUM 9.2 06/02/2016 1215   PROT 6.3 (L) 06/02/2016 1215   ALBUMIN 2.8 (L) 06/02/2016 1215   AST 12 06/02/2016 1215   ALT 7 06/02/2016 1215   ALKPHOS 126 06/02/2016 1215   BILITOT 0.85 06/02/2016 1215   GFRNONAA 31 (L) 05/17/2016 0534   GFRAA 36 (L) 05/17/2016  0534    No results found for: SPEP, UPEP  Lab Results  Component Value Date   WBC 6.3 06/02/2016   NEUTROABS 4.3 06/02/2016   HGB 8.5 (L) 06/02/2016   HCT 26.3 (L) 06/02/2016   MCV 89.8 06/02/2016   PLT 187 06/02/2016      Chemistry      Component Value Date/Time   NA 142 06/02/2016 1215   K 3.9 06/02/2016 1215   CL 101 05/17/2016 0534   CO2 24 06/02/2016 1215   BUN 17.4 06/02/2016 1215   CREATININE 1.2 (H) 06/02/2016 1215      Component Value Date/Time   CALCIUM 9.2 06/02/2016 1215   ALKPHOS 126 06/02/2016 1215   AST 12 06/02/2016 1215   ALT 7 06/02/2016 1215   BILITOT 0.85 06/02/2016 1215     ASSESSMENT & PLAN:  Anemia in chronic kidney disease The patient has multifactorial anemia, likely due to anemia of chronic renal disease and recent hip injury. She may also have untreated urinary tract infection as a cause of her anemia. The patient denies any recent signs or symptoms of bleeding such as spontaneous epistaxis, hematuria or hematochezia. She is not symptomatic from anemia. I will proceed with darbepoetin today and recheck her blood work again in 2 weeks. If she starts to respond to treatment, I will space out her future appointment again  Acute kidney injury superimposed on chronic kidney disease [facility]) The patient has recent renal failure. Repeat blood work today show mild improvement since discontinuation of her blood pressure medications. However, she is noted to have worsening hypertension and diffuse weight gain from edema. I will recheck her blood work again in 2 weeks for further assessment but I would like to proceed to restart her back on a low-dose diuretic therapy today  Dysuria The patient is noted to have mild mental status change. She denies major suprapubic discomfort but was noted to have mild dysuria. Preliminary urinalysis showed hematuria and large amount of bacteria, suspicious for urinary tract infection. I recommend a trial of oral  antibiotic therapy and will call her daughter once we have final urine culture results  Essential hypertension She has worsening hypertension and fluid retention since discontinuation of 2 different blood pressure medications in the hospital. Repeat kidney function tests today show recovery of her kidney function. I recommend low-dose diuretic therapy with hydrochlorothiazide and I will recheck her blood work again in 2 weeks.  Fluid overload She has clinical signs of fluid overload with significant weight gain since discontinuation of her recent blood pressure medications. She denies significant shortness of breath but examination revealed mild crackles on the left lung base. I recommend low-dose diuretic  therapy and she agreed to proceed  Hip fracture [facility]) She has recent nondisplaced hip fracture being managed conservatively without surgery. She is participating in physical rehabilitation. I reduce immobility is a contributing factor to her leg edema. She will continue vitamin D supplement and physical therapy as tolerated  Lung crackles She has audible left lower base crackles on exam. She has 7 pound weight gain within the last 2-1/2 weeks. I suspect it could be related to fluid retention/ atelectasis. Blood work showed no evidence of leukocytosis. I recommend diuretic therapy and close observation   Orders Placed This Encounter  Procedures  . Urine culture    Standing Status:   Future    Number of Occurrences:   1    Standing Expiration Date:   07/07/2017  . Comprehensive metabolic panel    Standing Status:   Future    Number of Occurrences:   1    Standing Expiration Date:   07/07/2017  . Urinalysis, Microscopic - [facility]    Standing Status:   Future    Number of Occurrences:   1    Standing Expiration Date:   07/07/2017  . Comprehensive metabolic panel    Standing Status:   Future    Standing Expiration Date:   07/07/2017  . Hold Tube, Blood Bank    Standing Status:    Future    Standing Expiration Date:   07/07/2017   All questions were answered. The patient knows to call the clinic with any problems, questions or concerns. No barriers to learning was detected. I spent 25 minutes counseling the patient face to face. The total time spent in the appointment was 40 minutes and more than 50% was on counseling and review of test results     [facility], Devanta Daniel, MD 06/02/2016 4:36 PM

## 2016-06-02 NOTE — Assessment & Plan Note (Signed)
She has clinical signs of fluid overload with significant weight gain since discontinuation of her recent blood pressure medications. She denies significant shortness of breath but examination revealed mild crackles on the left lung base. I recommend low-dose diuretic therapy and she agreed to proceed

## 2016-06-02 NOTE — Assessment & Plan Note (Signed)
The patient is noted to have mild mental status change. She denies major suprapubic discomfort but was noted to have mild dysuria. Preliminary urinalysis showed hematuria and large amount of bacteria, suspicious for urinary tract infection. I recommend a trial of oral antibiotic therapy and will call her daughter once we have final urine culture results

## 2016-06-02 NOTE — Telephone Encounter (Signed)
Gave patient relative avs report and appointments for September.  °

## 2016-06-02 NOTE — Assessment & Plan Note (Signed)
She has audible left lower base crackles on exam. She has 7 pound weight gain within the last 2-1/2 weeks. I suspect it could be related to fluid retention/ atelectasis. Blood work showed no evidence of leukocytosis. I recommend diuretic therapy and close observation

## 2016-06-02 NOTE — Patient Instructions (Signed)
Darbepoetin Alfa injection What is this medicine? DARBEPOETIN ALFA (dar be POE e tin AL fa) helps your body make more red blood cells. It is used to treat anemia caused by chronic kidney failure and chemotherapy. This medicine may be used for other purposes; ask your health care provider or pharmacist if you have questions. What should I tell my health care provider before I take this medicine? They need to know if you have any of these conditions: -blood clotting disorders or history of blood clots -cancer patient not on chemotherapy -cystic fibrosis -heart disease, such as angina, heart failure, or a history of a heart attack -hemoglobin level of 12 g/dL or greater -high blood pressure -low levels of folate, iron, or vitamin B12 -seizures -an unusual or allergic reaction to darbepoetin, erythropoietin, albumin, hamster proteins, latex, other medicines, foods, dyes, or preservatives -pregnant or trying to get pregnant -breast-feeding How should I use this medicine? This medicine is for injection into a vein or under the skin. It is usually given by a health care professional in a hospital or clinic setting. If you get this medicine at home, you will be taught how to prepare and give this medicine. Do not shake the solution before you withdraw a dose. Use exactly as directed. Take your medicine at regular intervals. Do not take your medicine more often than directed. It is important that you put your used needles and syringes in a special sharps container. Do not put them in a trash can. If you do not have a sharps container, call your pharmacist or healthcare provider to get one. Talk to your pediatrician regarding the use of this medicine in children. While this medicine may be used in children as young as 1 year for selected conditions, precautions do apply. Overdosage: If you think you have taken too much of this medicine contact a poison control center or emergency room at once. NOTE:  This medicine is only for you. Do not share this medicine with others. What if I miss a dose? If you miss a dose, take it as soon as you can. If it is almost time for your next dose, take only that dose. Do not take double or extra doses. What may interact with this medicine? Do not take this medicine with any of the following medications: -epoetin alfa This list may not describe all possible interactions. Give your health care provider a list of all the medicines, herbs, non-prescription drugs, or dietary supplements you use. Also tell them if you smoke, drink alcohol, or use illegal drugs. Some items may interact with your medicine. What should I watch for while using this medicine? Visit your prescriber or health care professional for regular checks on your progress and for the needed blood tests and blood pressure measurements. It is especially important for the doctor to make sure your hemoglobin level is in the desired range, to limit the risk of potential side effects and to give you the best benefit. Keep all appointments for any recommended tests. Check your blood pressure as directed. Ask your doctor what your blood pressure should be and when you should contact him or her. As your body makes more red blood cells, you may need to take iron, folic acid, or vitamin B supplements. Ask your doctor or health care provider which products are right for you. If you have kidney disease continue dietary restrictions, even though this medication can make you feel better. Talk with your doctor or health care professional about the   foods you eat and the vitamins that you take. What side effects may I notice from receiving this medicine? Side effects that you should report to your doctor or health care professional as soon as possible: -allergic reactions like skin rash, itching or hives, swelling of the face, lips, or tongue -breathing problems -changes in vision -chest pain -confusion, trouble speaking  or understanding -feeling faint or lightheaded, falls -high blood pressure -muscle aches or pains -pain, swelling, warmth in the leg -rapid weight gain -severe headaches -sudden numbness or weakness of the face, arm or leg -trouble walking, dizziness, loss of balance or coordination -seizures (convulsions) -swelling of the ankles, feet, hands -unusually weak or tired Side effects that usually do not require medical attention (report to your doctor or health care professional if they continue or are bothersome): -diarrhea -fever, chills (flu-like symptoms) -headaches -nausea, vomiting -redness, stinging, or swelling at site where injected This list may not describe all possible side effects. Call your doctor for medical advice about side effects. You may report side effects to FDA at 1-800-FDA-1088. Where should I keep my medicine? Keep out of the reach of children. Store in a refrigerator between 2 and 8 degrees C (36 and 46 degrees F). Do not freeze. Do not shake. Throw away any unused portion if using a single-dose vial. Throw away any unused medicine after the expiration date. NOTE: This sheet is a summary. It may not cover all possible information. If you have questions about this medicine, talk to your doctor, pharmacist, or health care provider.    2016, Elsevier/[employer] Standard. (2008-08-27 10:23:57)  

## 2016-06-02 NOTE — Assessment & Plan Note (Signed)
The patient has recent renal failure. Repeat blood work today show mild improvement since discontinuation of her blood pressure medications. However, she is noted to have worsening hypertension and diffuse weight gain from edema. I will recheck her blood work again in 2 weeks for further assessment but I would like to proceed to restart her back on a low-dose diuretic therapy today

## 2016-06-02 NOTE — Telephone Encounter (Signed)
Informed daughter, Santiago Glad, of lab results and Dr. Calton Dach message.  She verbalized understanding.  Faxed Rxs for Amoxicillin and HCTZ to [facility] at fax [phone].  Spoke w/ Roselyn Reef who says she will take Rx to their pharmacy.

## 2016-06-02 NOTE — Assessment & Plan Note (Signed)
She has recent nondisplaced hip fracture being managed conservatively without surgery. She is participating in physical rehabilitation. I reduce immobility is a contributing factor to her leg edema. She will continue vitamin D supplement and physical therapy as tolerated

## 2016-06-03 ENCOUNTER — Telehealth: Payer: Self-pay | Admitting: *Deleted

## 2016-06-03 NOTE — Telephone Encounter (Signed)
VM from daughter states pt has not been started on Antibiotic yet as the SNF told her they are still waiting on order from [facility].   I faxed over RX yesterday.  Called SNF- [facility],  and s/w Aisha who says they did receive order (she doesn't know when) but she did give pt dose this morning.    Notified daughter they did start antibiotic this morning and I do not know why it was not started last night.  Daughter will need to check w/ SNF, but told her I did fax it yesterday afternoon.    Daughter also reports pt had nausea w/ some dry heaves last night and asks if this is side effect of the injection (aranesp) she got yesterday?  Informed her it is not side effect of Aranesp and more likely a symptom of pt's UTI.  Instructed her to contact pt's PCP if nausea continues and also if UTI symptoms do not improve or any other medical problems.  She verbalized understanding.

## 2016-06-04 LAB — URINE CULTURE

## 2016-06-07 ENCOUNTER — Telehealth: Payer: Self-pay

## 2016-06-07 DIAGNOSIS — E0840 Diabetes mellitus due to underlying condition with diabetic neuropathy, unspecified: Secondary | ICD-10-CM

## 2016-06-07 NOTE — Telephone Encounter (Signed)
Pt is staying at [place] her daughter in [place] since coming home from rehab. Pt will be there until 06/09/16; pt came home on insulin and pt does not want to take insulin; pt wants to go back on oral diabetic medication; Christy and her mom do not know the names of the meds since her med bottles are at pts home. Alyse Low also request an endocrinology referral for pt. Pt advised Alyse Low that her diabetic meter at home is broken, spoke with [facility] in [place] and pt got new meter 09/2015 and was advised by pharmacy to contact the meter company and get a replacement meter. Christy voiced understanding. Christy request cb. Pt annual exam on 05/06/16. [facility] [place].

## 2016-06-08 ENCOUNTER — Telehealth: Payer: Self-pay

## 2016-06-08 NOTE — Telephone Encounter (Signed)
Given to [employer]

## 2016-06-08 NOTE — Telephone Encounter (Signed)
Alvis Lemmings ask that we fax orders to [phone].

## 2016-06-08 NOTE — Telephone Encounter (Signed)
Spoke with Sondra Barges (daughter) to get Christy's phone number.  Tami states Ms. Stride does not have a glucose meter with her in [place] to check her blood sugars.  She states that her mom's blood sugars were fluctuating in rehab and that is why the put her on insulin.  She has always been on oral medications in the past and does not want to do insulin.  She will be returning from [place] tomorrow evening but can not come in on Friday for an appointment due to a family wedding.  Discharge Summary requested from [facility] in [place].  Christy's phone number [phone].

## 2016-06-08 NOTE — Telephone Encounter (Signed)
No return number given for [facility].  Left message on Ms. Antillon cell phone to return my call.

## 2016-06-08 NOTE — Telephone Encounter (Signed)
[name] with [facility] left v/m; [name] did receive orders faxed to her; Jacqlyn Larsen also needs confirmation that it is OK for delay of care until 06/14/16.

## 2016-06-08 NOTE — Telephone Encounter (Signed)
Left message for Alyse Low to return my call.

## 2016-06-08 NOTE — Telephone Encounter (Signed)
I will review the discharge summary when it arrive.  Let go ahead and stop the insulin and put her back on her previous oral diabetes meds.  Have her follow CBGs at home and make a follow up appt 52min when she can.

## 2016-06-08 NOTE — Telephone Encounter (Signed)
Becky with [facility] left v/m requesting home health starter care on 06/14/16 when pt returns home; will Dr Diona Browner  be attending physician for pt with home health. Becky request cb.

## 2016-06-08 NOTE — Telephone Encounter (Signed)
Orders faxed to [facility] at [place].

## 2016-06-08 NOTE — Telephone Encounter (Signed)
yes

## 2016-06-08 NOTE — Telephone Encounter (Signed)
Have pt follow CBGs at home , record then bring into an appt 30 min to discuss changing back to oral meds. This probably will be fine but I would like to see her, review the numbers and her discharge info before making the change.  Lab Results  Component Value Date   HGBA1C 6.4 04/28/2016

## 2016-06-08 NOTE — Telephone Encounter (Signed)
Christy notified as instructed by telephone.  She states they started giving her mom insulin when she was in the hospital and because that is what was on her discharge order, they just continued giving Ms. Balles insulin at rehab.  She states Ms. Brignoni was not happy about this because no one really ask or discussed this with her.   She states they went ahead and gave her insulin last night.  She will be home tomorrow evening so they will wait to restart oral medication then.  Advised Ms. Trumbull oral medication should be Glipizide XL 5 mg daily and Actos 30 mg, 1/2 tablet daily.  Alyse Low states her mom has the oral medications at home in [place] , so she does not need any refills at this time.  Alyse Low still would like a referral to Endocrinology.  She will also have one of her sisters to call and schedule Ms. Prosch an office visit with Dr. Diona Browner in the next week or so.  Medication list updated.

## 2016-06-08 NOTE — Telephone Encounter (Signed)
Agree with plan. Referral sent.

## 2016-06-09 NOTE — Telephone Encounter (Signed)
Orders refaxed to [facility] at [place] with note that it is okay to delay care until 06/14/2016.

## 2016-06-14 ENCOUNTER — Telehealth: Payer: Self-pay

## 2016-06-14 DIAGNOSIS — R262 Difficulty in walking, not elsewhere classified: Secondary | ICD-10-CM | POA: Diagnosis not present

## 2016-06-14 DIAGNOSIS — E139 Other specified diabetes mellitus without complications: Secondary | ICD-10-CM | POA: Diagnosis not present

## 2016-06-14 DIAGNOSIS — R488 Other symbolic dysfunctions: Secondary | ICD-10-CM | POA: Diagnosis not present

## 2016-06-14 NOTE — Telephone Encounter (Signed)
[name] with [facility] called back; losartan K was not taken while in rehab and pt wants to know if should be taking. Pt is not taking the losartan K at this time.Please advise. Pt was seen 05/06/16.

## 2016-06-14 NOTE — Telephone Encounter (Signed)
Christine nurse with [facility] left v/m wanting to discuss pts meds.left v/m requesting cb.

## 2016-06-15 ENCOUNTER — Telehealth: Payer: Self-pay | Admitting: Cardiovascular Disease

## 2016-06-15 DIAGNOSIS — M25551 Pain in right hip: Secondary | ICD-10-CM | POA: Diagnosis not present

## 2016-06-15 NOTE — Telephone Encounter (Signed)
Left message for Christine/[name] to call me back with some blood pressure reading for Ms. Boyett to determine if she needs to be taking the Losartan.

## 2016-06-15 NOTE — Telephone Encounter (Signed)
[name],[name],called.  Patient's blood pressure reading from 06/14/16 is 132/70. [name] can be reached at [phone].

## 2016-06-15 NOTE — Telephone Encounter (Signed)
Rings w no answer or VM pickup.

## 2016-06-15 NOTE — Telephone Encounter (Signed)
Please call,needs clarification on her Metoprolol.

## 2016-06-15 NOTE — Telephone Encounter (Signed)
Where are her BPs running?

## 2016-06-15 NOTE — Telephone Encounter (Signed)
- 

## 2016-06-16 DIAGNOSIS — E139 Other specified diabetes mellitus without complications: Secondary | ICD-10-CM | POA: Diagnosis not present

## 2016-06-16 DIAGNOSIS — R262 Difficulty in walking, not elsewhere classified: Secondary | ICD-10-CM | POA: Diagnosis not present

## 2016-06-16 DIAGNOSIS — R488 Other symbolic dysfunctions: Secondary | ICD-10-CM | POA: Diagnosis not present

## 2016-06-16 NOTE — Telephone Encounter (Signed)
Returned call. This actually was [place], patient's home health nurse, who needed clarification on dose and form of patient's metoprolol. Gave advisement on patient's documented regimen, which she reports conforms with the way patient is currently taking. Advised to call if any further needs.

## 2016-06-16 NOTE — Telephone Encounter (Signed)
Left message for [name] to hold [name]'s Losartan for now per Dr. Diona Browner.

## 2016-06-17 ENCOUNTER — Telehealth: Payer: Self-pay | Admitting: Hematology and Oncology

## 2016-06-17 ENCOUNTER — Ambulatory Visit (HOSPITAL_BASED_OUTPATIENT_CLINIC_OR_DEPARTMENT_OTHER): Payer: Medicare Other | Admitting: Hematology and Oncology

## 2016-06-17 ENCOUNTER — Other Ambulatory Visit: Payer: Self-pay | Admitting: Family Medicine

## 2016-06-17 ENCOUNTER — Other Ambulatory Visit (HOSPITAL_BASED_OUTPATIENT_CLINIC_OR_DEPARTMENT_OTHER): Payer: Medicare Other

## 2016-06-17 ENCOUNTER — Encounter: Payer: Self-pay | Admitting: Hematology and Oncology

## 2016-06-17 ENCOUNTER — Ambulatory Visit (HOSPITAL_BASED_OUTPATIENT_CLINIC_OR_DEPARTMENT_OTHER): Payer: Medicare Other

## 2016-06-17 VITALS — BP 148/45

## 2016-06-17 DIAGNOSIS — Z23 Encounter for immunization: Secondary | ICD-10-CM

## 2016-06-17 DIAGNOSIS — N189 Chronic kidney disease, unspecified: Secondary | ICD-10-CM

## 2016-06-17 DIAGNOSIS — R5381 Other malaise: Secondary | ICD-10-CM | POA: Diagnosis not present

## 2016-06-17 DIAGNOSIS — I10 Essential (primary) hypertension: Secondary | ICD-10-CM | POA: Diagnosis not present

## 2016-06-17 DIAGNOSIS — D509 Iron deficiency anemia, unspecified: Secondary | ICD-10-CM

## 2016-06-17 DIAGNOSIS — D631 Anemia in chronic kidney disease: Secondary | ICD-10-CM

## 2016-06-17 DIAGNOSIS — N179 Acute kidney failure, unspecified: Secondary | ICD-10-CM

## 2016-06-17 DIAGNOSIS — D539 Nutritional anemia, unspecified: Secondary | ICD-10-CM

## 2016-06-17 LAB — CBC & DIFF AND RETIC
BASO%: 0.9 % (ref 0.0–2.0)
BASOS ABS: 0.1 10*3/uL (ref 0.0–0.1)
EOS ABS: 0.4 10*3/uL (ref 0.0–0.5)
EOS%: 7.8 % — AB (ref 0.0–7.0)
HEMATOCRIT: 28.3 % — AB (ref 34.8–46.6)
HGB: 8.9 g/dL — ABNORMAL LOW (ref 11.6–15.9)
Immature Retic Fract: 11.5 % — ABNORMAL HIGH (ref 1.60–10.00)
LYMPH#: 1.3 10*3/uL (ref 0.9–3.3)
LYMPH%: 24.7 % (ref 14.0–49.7)
MCH: 29 pg (ref 25.1–34.0)
MCHC: 31.4 g/dL — AB (ref 31.5–36.0)
MCV: 92.2 fL (ref 79.5–101.0)
MONO#: 0.3 10*3/uL (ref 0.1–0.9)
MONO%: 5.7 % (ref 0.0–14.0)
NEUT%: 60.9 % (ref 38.4–76.8)
NEUTROS ABS: 3.2 10*3/uL (ref 1.5–6.5)
Platelets: 189 10*3/uL (ref 145–400)
RBC: 3.07 10*6/uL — ABNORMAL LOW (ref 3.70–5.45)
RDW: 17 % — AB (ref 11.2–14.5)
RETIC %: 3.77 % — AB (ref 0.70–2.10)
RETIC CT ABS: 115.74 10*3/uL — AB (ref 33.70–90.70)
WBC: 5.3 10*3/uL (ref 3.9–10.3)

## 2016-06-17 LAB — COMPREHENSIVE METABOLIC PANEL
ALK PHOS: 119 U/L (ref 40–150)
ALT: 9 U/L (ref 0–55)
AST: 13 U/L (ref 5–34)
Albumin: 2.9 g/dL — ABNORMAL LOW (ref 3.5–5.0)
Anion Gap: 11 mEq/L (ref 3–11)
BUN: 25.8 mg/dL (ref 7.0–26.0)
CALCIUM: 9 mg/dL (ref 8.4–10.4)
CHLORIDE: 105 meq/L (ref 98–109)
CO2: 25 mEq/L (ref 22–29)
Creatinine: 1.3 mg/dL — ABNORMAL HIGH (ref 0.6–1.1)
EGFR: 40 mL/min/{1.73_m2} — ABNORMAL LOW (ref >90)
Glucose: 163 mg/dl — ABNORMAL HIGH (ref 70–140)
Potassium: 4 mEq/L (ref 3.5–5.1)
SODIUM: 141 meq/L (ref 136–145)
Total Bilirubin: 0.7 mg/dL (ref 0.20–1.20)
Total Protein: 6.2 g/dL — ABNORMAL LOW (ref 6.4–8.3)

## 2016-06-17 MED ORDER — INFLUENZA VAC SPLIT QUAD 0.5 ML IM SUSY
0.5000 mL | PREFILLED_SYRINGE | Freq: Once | INTRAMUSCULAR | Status: AC
  Administered 2016-06-17: 0.5 mL via INTRAMUSCULAR
  Filled 2016-06-17: qty 0.5

## 2016-06-17 MED ORDER — DARBEPOETIN ALFA 200 MCG/0.4ML IJ SOSY
200.0000 ug | PREFILLED_SYRINGE | Freq: Once | INTRAMUSCULAR | Status: AC
  Administered 2016-06-17: 200 ug via SUBCUTANEOUS
  Filled 2016-06-17: qty 0.4

## 2016-06-17 NOTE — Progress Notes (Signed)
[place], MD SUMMARY OF HEMATOLOGIC HISTORY:  [name] is here because of severe anemia. The patient have chronic anemia on and off for the past few years. She has undergone extensive evaluation in the past. Early 2015, she was found to have severe anemia with hemoglobin of 6.8 and underwent extensive evaluation and was found to have iron deficiency. She was placed on oral iron supplement which she takes 2 a day for a while. She also received blood transfusion in the past. She did not undergo repeat GI evaluation last year. Her last colonoscopy was in 2009. Recently, she was hospitalized for pneumonia between 09/15/2015 to 09/19/2015. She had acute mental status change and acute renal failure which improved with conservative management She had a low hemoglobin of 6.4 and received blood transfusion in the hospital. She received antibiotic for pneumonia. At present time, she complained of fatigue. She denies recent chest pain on exertion, shortness of breath on minimal exertion, pre-syncopal episodes, or palpitations. She had not noticed any recent bleeding such as epistaxis, hematuria or hematochezia The patient denies over the counter NSAID ingestion. She is on antiplatelets agents. Her last colonoscopy was in 2009. She does not recall having EGD She denies any pica and eats a variety of diet. She never donated blood She also have interesting history of right breast cancer, discovered when she was 35 and premenopausal. From her collection, she may have stage II disease due to regional lymph nodes involvement. She had mastectomy and complete lymph node dissection on the right axilla. She recalled receiving some form of chemotherapy but never received tamoxifen or radiation. In the 90s, she was diagnosed with uterine cancer due to abnormal postmenopausal bleeding. She had complete hysterectomy but never received adjuvant  treatment. On 10/02/2015, she received blood transfusion for severe anemia On 10/09/2015, she started on Aranesp injection The patient has nondisplaced right hip fracture in August and was hospitalized. That was managed conservatively  INTERVAL HISTORY: [name] 82 y.o. female returns for follow-up. She is doing well and has returned home. She has lost some weight. She is participating in physical therapy. Her energy level is fair. The patient denies any recent signs or symptoms of bleeding such as spontaneous epistaxis, hematuria or hematochezia.   I have reviewed the past medical history, past surgical history, social history and family history with the patient and they are unchanged from previous note.  ALLERGIES:  is allergic to levofloxacin; morphine and related; and tape.  MEDICATIONS:  Current Outpatient Prescriptions  Medication Sig Dispense Refill  . amoxicillin (AMOXIL) 500 MG capsule Take 1 capsule (500 mg total) by mouth 2 (two) times daily. 14 capsule 0  . aspirin 81 MG tablet Take 81 mg by mouth daily.      Marland Kitchen atorvastatin (LIPITOR) 40 MG tablet Take 1 tablet (40 mg total) by mouth daily. 90 tablet 3  . Blood Glucose Monitoring Suppl (ACCU-CHEK AVIVA) device Use to check blood sugar once daily.  Dx: E11.43 1 each 0  . cyanocobalamin 1000 MCG tablet Take 1,000 mcg by mouth daily. Reported on 09/11/2015    . Ferrous Sulfate (IRON) 325 (65 FE) MG TABS Take 1 tablet by mouth 2 (two) times daily. Reported on 09/11/2015    . gabapentin (NEURONTIN) 100 MG capsule TAKE ONE CAPSULE 3 TIMES A DAY (Patient taking differently: Take 100 mg by mouth three times a day) 90 capsule 3  . glipiZIDE (GLIPIZIDE XL) 5 MG  24 hr tablet Take 5 mg by mouth daily with breakfast.    . glucose blood (ACCU-CHEK AVIVA) test strip Use to check blood sugar once daily.  Dx: E11.43 100 each 3  . hydrochlorothiazide (MICROZIDE) 12.5 MG capsule Take 1 capsule (12.5 mg total) by mouth daily. 30 capsule  0  . HYDROcodone-acetaminophen (NORCO/VICODIN) 5-325 MG tablet Take 1 tablet by mouth every 4 (four) hours as needed for moderate pain. 40 tablet 0  . Lancet Devices (ACCU-CHEK SOFTCLIX) lancets Use to check blood sugar once daily.  Dx: E11.43 1 each 0  . Lancets (ACCU-CHEK SOFT TOUCH) lancets Use to check blood sugar once daily.  Dx: E11.43 100 each 3  . levothyroxine (SYNTHROID, LEVOTHROID) 50 MCG tablet TAKE 1 TABLET BY MOUTH EVERY DAY 30 tablet 5  . metoprolol succinate (TOPROL-XL) 25 MG 24 hr tablet Take 25 mg by mouth 2 (two) times daily.    . nitroGLYCERIN (NITROSTAT) 0.4 MG SL tablet Place 1 tablet (0.4 mg total) under the tongue every 5 (five) minutes as needed for chest pain (x 3 tabs daily). 25 tablet 3  . ondansetron (ZOFRAN) 4 MG tablet Take 1 tablet (4 mg total) by mouth every 8 (eight) hours as needed. 20 tablet 0  . pioglitazone (ACTOS) 30 MG tablet Take 15 mg by mouth daily.    . sertraline (ZOLOFT) 25 MG tablet TAKE 1 TABLET (25 MG TOTAL) BY MOUTH DAILY. 30 tablet 5  . UNABLE TO FIND Darbepoetin Alfa- once every other month at [facility]    . Vitamin D, Ergocalciferol, (DRISDOL) 50000 units CAPS capsule TAKE 1 CAPSULE BY MOUTH EVERY 7 DAYS. 4 capsule 2   No current facility-administered medications for this visit.    Facility-Administered Medications Ordered in Other Visits  Medication Dose Route Frequency Provider Last Rate Last Dose  . Darbepoetin Alfa (ARANESP) injection 200 mcg  200 mcg Subcutaneous Once Heath Lark, MD         REVIEW OF SYSTEMS:   Constitutional: Denies fevers, chills or night sweats Eyes: Denies blurriness of vision Ears, nose, mouth, throat, and face: Denies mucositis or sore throat Respiratory: Denies cough, dyspnea or wheezes Cardiovascular: Denies palpitation, chest discomfort or lower extremity swelling Gastrointestinal:  Denies nausea, heartburn or change in bowel habits Skin: Denies abnormal skin rashes Lymphatics: Denies new lymphadenopathy  or easy bruising Neurological:Denies numbness, tingling or new weaknesses Behavioral/Psych: Mood is stable, no new changes  All other systems were reviewed with the patient and are negative.  PHYSICAL EXAMINATION: ECOG PERFORMANCE STATUS: 2 - Symptomatic, <50% confined to bed  Vitals:   06/17/16 1209  BP: (!) 168/44  Pulse: (!) 58  Resp: 18  Temp: 97.3 F (36.3 C)   Filed Weights   06/17/16 1209  Weight: 184 lb 12.8 oz (83.8 kg)    GENERAL:alert, no distress and comfortable SKIN: skin color, texture, turgor are normal, no rashes or significant lesions EYES: normal, Conjunctiva are pink and non-injected, sclera clear OROPHARYNX:no exudate, no erythema and lips, buccal mucosa, and tongue normal  NECK: supple, thyroid normal size, non-tender, without nodularity LYMPH:  no palpable lymphadenopathy in the cervical, axillary or inguinal LUNGS: clear to auscultation and percussion with normal breathing effort HEART: regular rate & rhythm and no murmurs and no lower extremity edema ABDOMEN:abdomen soft, non-tender and normal bowel sounds Musculoskeletal:no cyanosis of digits and no clubbing  NEURO: alert & oriented x 3 with fluent speech, no focal motor/sensory deficits  LABORATORY DATA:  I have reviewed the data  as listed     Component Value Date/Time   NA 141 06/17/2016 1147   K 4.0 06/17/2016 1147   CL 101 05/17/2016 0534   CO2 25 06/17/2016 1147   GLUCOSE 163 (H) 06/17/2016 1147   BUN 25.8 06/17/2016 1147   CREATININE 1.3 (H) 06/17/2016 1147   CALCIUM 9.0 06/17/2016 1147   PROT 6.2 (L) 06/17/2016 1147   ALBUMIN 2.9 (L) 06/17/2016 1147   AST 13 06/17/2016 1147   ALT 9 06/17/2016 1147   ALKPHOS 119 06/17/2016 1147   BILITOT 0.70 06/17/2016 1147   GFRNONAA 31 (L) 05/17/2016 0534   GFRAA 36 (L) 05/17/2016 0534    No results found for: SPEP, UPEP  Lab Results  Component Value Date   WBC 5.3 06/17/2016   NEUTROABS 3.2 06/17/2016   HGB 8.9 (L) 06/17/2016   HCT 28.3  (L) 06/17/2016   MCV 92.2 06/17/2016   PLT 189 06/17/2016      Chemistry      Component Value Date/Time   NA 141 06/17/2016 1147   K 4.0 06/17/2016 1147   CL 101 05/17/2016 0534   CO2 25 06/17/2016 1147   BUN 25.8 06/17/2016 1147   CREATININE 1.3 (H) 06/17/2016 1147      Component Value Date/Time   CALCIUM 9.0 06/17/2016 1147   ALKPHOS 119 06/17/2016 1147   AST 13 06/17/2016 1147   ALT 9 06/17/2016 1147   BILITOT 0.70 06/17/2016 1147     ASSESSMENT & PLAN:  Anemia in chronic kidney disease She has mild worsening acute on chronic anemia related to recent hip fracture. I recommend increasing the frequency of injection every 3 weeks and I will see her back again in 6 weeks. There are signs of reticulocytosis and I am hopeful that we can resolve the anemia soon  Essential hypertension She has poorly controlled hypertension recently due to mild acute on chronic renal failure while hospitalized. Due to poorly controlled hypertension, I recommend dose adjustment of her diuretic. I recommend she makes an appointment to see her primary care doctor soon for medication adjustment I warned her that she may not get her next dose of darbepoetin in the future if her systolic blood pressure is consistently over 160  Physical debility She has significant physical debility due to recent hip fracture. She is currently at home and I encouraged her to work closely with physical therapist   All questions were answered. The patient knows to call the clinic with any problems, questions or concerns. No barriers to learning was detected.  I spent 15 minutes counseling the patient face to face. The total time spent in the appointment was 20 minutes and more than 50% was on counseling.     [facility], Oakleigh Hesketh, MD 9/21/201712:48 PM

## 2016-06-17 NOTE — Telephone Encounter (Signed)
Avs report and appointment schedule given to patient, per 06/17/16 los. °

## 2016-06-17 NOTE — Assessment & Plan Note (Signed)
She has poorly controlled hypertension recently due to mild acute on chronic renal failure while hospitalized. Due to poorly controlled hypertension, I recommend dose adjustment of her diuretic. I recommend she makes an appointment to see her primary care doctor soon for medication adjustment I warned her that she may not get her next dose of darbepoetin in the future if her systolic blood pressure is consistently over 160

## 2016-06-17 NOTE — Addendum Note (Signed)
Addended by: Ricarda Frame C on: 06/17/2016 01:22 PM   Modules accepted: Orders

## 2016-06-17 NOTE — Assessment & Plan Note (Signed)
She has mild worsening acute on chronic anemia related to recent hip fracture. I recommend increasing the frequency of injection every 3 weeks and I will see her back again in 6 weeks. There are signs of reticulocytosis and I am hopeful that we can resolve the anemia soon

## 2016-06-17 NOTE — Assessment & Plan Note (Signed)
She has significant physical debility due to recent hip fracture. She is currently at home and I encouraged her to work closely with physical therapist

## 2016-06-18 DIAGNOSIS — R262 Difficulty in walking, not elsewhere classified: Secondary | ICD-10-CM | POA: Diagnosis not present

## 2016-06-18 DIAGNOSIS — R488 Other symbolic dysfunctions: Secondary | ICD-10-CM | POA: Diagnosis not present

## 2016-06-18 DIAGNOSIS — E139 Other specified diabetes mellitus without complications: Secondary | ICD-10-CM | POA: Diagnosis not present

## 2016-06-21 ENCOUNTER — Other Ambulatory Visit: Payer: Self-pay

## 2016-06-21 ENCOUNTER — Encounter: Payer: Self-pay | Admitting: Hematology and Oncology

## 2016-06-21 ENCOUNTER — Ambulatory Visit: Payer: Self-pay | Admitting: Hematology and Oncology

## 2016-06-21 ENCOUNTER — Ambulatory Visit: Payer: Self-pay

## 2016-06-21 ENCOUNTER — Other Ambulatory Visit: Payer: Self-pay | Admitting: Hematology and Oncology

## 2016-06-22 ENCOUNTER — Ambulatory Visit (INDEPENDENT_AMBULATORY_CARE_PROVIDER_SITE_OTHER): Payer: Medicare Other | Admitting: Family Medicine

## 2016-06-22 ENCOUNTER — Encounter: Payer: Self-pay | Admitting: Family Medicine

## 2016-06-22 VITALS — BP 135/86 | HR 62 | Temp 97.5°F | Ht 63.0 in | Wt 181.0 lb

## 2016-06-22 DIAGNOSIS — Z8781 Personal history of (healed) traumatic fracture: Secondary | ICD-10-CM | POA: Diagnosis not present

## 2016-06-22 DIAGNOSIS — I2583 Coronary atherosclerosis due to lipid rich plaque: Secondary | ICD-10-CM

## 2016-06-22 DIAGNOSIS — E0840 Diabetes mellitus due to underlying condition with diabetic neuropathy, unspecified: Secondary | ICD-10-CM | POA: Diagnosis not present

## 2016-06-22 DIAGNOSIS — I2510 Atherosclerotic heart disease of native coronary artery without angina pectoris: Secondary | ICD-10-CM | POA: Diagnosis not present

## 2016-06-22 DIAGNOSIS — E0843 Diabetes mellitus due to underlying condition with diabetic autonomic (poly)neuropathy: Secondary | ICD-10-CM

## 2016-06-22 DIAGNOSIS — I10 Essential (primary) hypertension: Secondary | ICD-10-CM

## 2016-06-22 DIAGNOSIS — E785 Hyperlipidemia, unspecified: Secondary | ICD-10-CM

## 2016-06-22 LAB — HM DIABETES FOOT EXAM

## 2016-06-22 NOTE — Assessment & Plan Note (Signed)
Make sure taking the higher dose atorvastatin. Recheck in 3 months.

## 2016-06-22 NOTE — Assessment & Plan Note (Signed)
Try to decrease daytime fatigue by moving gabapentin to bedtime, especially since pain is mainly in feet at night. Start at 200 mg , increase to 300 mg if needed.

## 2016-06-22 NOTE — Progress Notes (Signed)
Pre visit review using our clinic review tool, if applicable. No additional management support is needed unless otherwise documented below in the visit note. 

## 2016-06-22 NOTE — Progress Notes (Signed)
Subjective:    Patient ID: [name], female    DOB: 1933-05-28, 82 y.o.   MRN: [id number]  HPI  82 year old female presents for follow up after  Fall on 8/18 /2017 resulting in hospitalization for  nondisplaced right greater trochanter fracture. On side of previous hip prosthesis.  Followed by [facility],  Referred to SNF on discharge on 05/17/2016 for PT. During hospitalization anemia, CAD, DM was stable. Cholesterol was not at goal LD 235.Marland Kitchen So admitting physician increase lipitor to 80 mg daily. With CKD stage 3 .. Cozaar and diuretic held.  In rehab DM was controlled with insulin which she did not like.  At D/C by phone we changed her back to her oral DM meds. Since D/C from rehab on  06/06/2015.  Was in  [place] staying with daughter.   She is using tylenol for pain control of right hip.  Diabetes:  On actos and glipizide. Using medications without difficulties: Hypoglycemic episodes:None Hyperglycemic episodes:None Feet problems:None Blood Sugars averaging: FBS 81-125. eye exam within last year:  Hypertension:   Back on cozaar, metoprolol and HCTZ  Using medication without problems or lightheadedness:  Chest pain with exertion: None Edema: YES Short of breath: None Average home BPs: 135-154/59-65 Other issues: BP Readings from Last 3 Encounters:  06/22/16 135/86  06/17/16 (!) 148/45  06/17/16 (!) 168/44   Recently saw nephrologist Dr. Alvy Bimler:  For Darbopoetin  injections.  Elevated Cholesterol: Due for re-eval in November as now on higher dose atorvastatin. Using medications without problems: Muscle aches:  Diet compliance: Moderate Exercise: PT Other complaints:  Poor control of neuropathy at night  Fatigued during the day. Not sure if gabapentin is helping.  Review of Systems  Constitutional: Negative for fatigue and fever.  HENT: Negative for ear pain.   Eyes: Negative for pain.  Respiratory: Negative for chest tightness and shortness of breath.     Cardiovascular: Negative for chest pain, palpitations and leg swelling.  Gastrointestinal: Negative for abdominal pain.  Genitourinary: Negative for dysuria.       Objective:   Physical Exam  Constitutional: Vital signs are normal. She appears well-developed and well-nourished. She is cooperative.  Non-toxic appearance. She does not appear ill. No distress.  obese  HENT:  Head: Normocephalic.  Right Ear: Hearing, tympanic membrane, external ear and ear canal normal. Tympanic membrane is not erythematous, not retracted and not bulging.  Left Ear: Hearing, tympanic membrane, external ear and ear canal normal. Tympanic membrane is not erythematous, not retracted and not bulging.  Nose: No mucosal edema or rhinorrhea. Right sinus exhibits no maxillary sinus tenderness and no frontal sinus tenderness. Left sinus exhibits no maxillary sinus tenderness and no frontal sinus tenderness.  Mouth/Throat: Uvula is midline, oropharynx is clear and moist and mucous membranes are normal.  Eyes: Conjunctivae, EOM and lids are normal. Pupils are equal, round, and reactive to light. Lids are everted and swept, no foreign bodies found.  Neck: Trachea normal and normal range of motion. Neck supple. Carotid bruit is not present. No thyroid mass and no thyromegaly present.  Cardiovascular: Normal rate, regular rhythm, S1 normal, S2 normal, normal heart sounds, intact distal pulses and normal pulses.  Exam reveals no gallop and no friction rub.   No murmur heard. Left lower leg edema, 1 plus pitting  Pulmonary/Chest: Effort normal and breath sounds normal. No tachypnea. No respiratory distress. She has no decreased breath sounds. She has no wheezes. She has no rhonchi. She has no rales.  Abdominal: Soft. Normal appearance and bowel sounds are normal. There is no tenderness.  Neurological: She is alert.  Skin: Skin is warm, dry and intact. No rash noted.  Psychiatric: Her speech is normal and behavior is normal.  Judgment and thought content normal. Her mood appears not anxious. Cognition and memory are normal. She does not exhibit a depressed mood.    Diabetic foot exam: Normal inspection No skin breakdown No calluses  Normal DP pulses Decrased sensation to light touch and monofilament Nails normal       Assessment & Plan:

## 2016-06-22 NOTE — Patient Instructions (Addendum)
Increase HCTZ to 25 mg daily. Follow BP at home after 1 week.. Call if persistently > 140/90.  Continue current diabetes medications, work on low carb diet.  Make sure using atorvastatin  80 mg daily ( if you have atorvastatin 40 mg leftover you can use it up by taking 2)  Change gabapentin to 200 mg at bedtime. IF still pain in feet increase to 300 mg at bedtime.

## 2016-06-22 NOTE — Assessment & Plan Note (Signed)
Elevated at home and other MD. Increase HCTZ to 25 mg daily.

## 2016-06-22 NOTE — Assessment & Plan Note (Signed)
Needs tighter cholesterol control given CAD.

## 2016-06-22 NOTE — Assessment & Plan Note (Signed)
Good control on oral medications. Encouraged exercise, weight loss, healthy eating habits.

## 2016-06-23 DIAGNOSIS — E139 Other specified diabetes mellitus without complications: Secondary | ICD-10-CM | POA: Diagnosis not present

## 2016-06-23 DIAGNOSIS — R488 Other symbolic dysfunctions: Secondary | ICD-10-CM | POA: Diagnosis not present

## 2016-06-23 DIAGNOSIS — R262 Difficulty in walking, not elsewhere classified: Secondary | ICD-10-CM | POA: Diagnosis not present

## 2016-06-25 ENCOUNTER — Encounter: Payer: Self-pay | Admitting: Endocrinology

## 2016-06-25 DIAGNOSIS — E139 Other specified diabetes mellitus without complications: Secondary | ICD-10-CM | POA: Diagnosis not present

## 2016-06-25 DIAGNOSIS — R488 Other symbolic dysfunctions: Secondary | ICD-10-CM | POA: Diagnosis not present

## 2016-06-25 DIAGNOSIS — R262 Difficulty in walking, not elsewhere classified: Secondary | ICD-10-CM | POA: Diagnosis not present

## 2016-06-28 DIAGNOSIS — R488 Other symbolic dysfunctions: Secondary | ICD-10-CM | POA: Diagnosis not present

## 2016-06-28 DIAGNOSIS — R262 Difficulty in walking, not elsewhere classified: Secondary | ICD-10-CM | POA: Diagnosis not present

## 2016-06-28 DIAGNOSIS — E139 Other specified diabetes mellitus without complications: Secondary | ICD-10-CM | POA: Diagnosis not present

## 2016-06-30 DIAGNOSIS — R488 Other symbolic dysfunctions: Secondary | ICD-10-CM | POA: Diagnosis not present

## 2016-06-30 DIAGNOSIS — R262 Difficulty in walking, not elsewhere classified: Secondary | ICD-10-CM | POA: Diagnosis not present

## 2016-06-30 DIAGNOSIS — E139 Other specified diabetes mellitus without complications: Secondary | ICD-10-CM | POA: Diagnosis not present

## 2016-07-01 DIAGNOSIS — S72114D Nondisplaced fracture of greater trochanter of right femur, subsequent encounter for closed fracture with routine healing: Secondary | ICD-10-CM | POA: Diagnosis not present

## 2016-07-01 DIAGNOSIS — M25551 Pain in right hip: Secondary | ICD-10-CM | POA: Diagnosis not present

## 2016-07-06 ENCOUNTER — Telehealth: Payer: Self-pay | Admitting: Hematology and Oncology

## 2016-07-06 NOTE — Telephone Encounter (Signed)
10/11 Appointment rescheduled to 10/16 per patient request.

## 2016-07-07 ENCOUNTER — Ambulatory Visit: Payer: Self-pay

## 2016-07-07 ENCOUNTER — Other Ambulatory Visit: Payer: Self-pay

## 2016-07-12 ENCOUNTER — Encounter: Payer: Self-pay | Admitting: Family Medicine

## 2016-07-12 ENCOUNTER — Other Ambulatory Visit (HOSPITAL_BASED_OUTPATIENT_CLINIC_OR_DEPARTMENT_OTHER): Payer: Medicare Other

## 2016-07-12 ENCOUNTER — Ambulatory Visit: Payer: Medicare Other

## 2016-07-12 DIAGNOSIS — D509 Iron deficiency anemia, unspecified: Secondary | ICD-10-CM

## 2016-07-12 LAB — CBC & DIFF AND RETIC
BASO%: 0.8 % (ref 0.0–2.0)
BASOS ABS: 0 10*3/uL (ref 0.0–0.1)
EOS%: 6.9 % (ref 0.0–7.0)
Eosinophils Absolute: 0.3 10*3/uL (ref 0.0–0.5)
HEMATOCRIT: 33.6 % — AB (ref 34.8–46.6)
HEMOGLOBIN: 10.9 g/dL — AB (ref 11.6–15.9)
Immature Retic Fract: 5.8 % (ref 1.60–10.00)
LYMPH#: 1.3 10*3/uL (ref 0.9–3.3)
LYMPH%: 26.8 % (ref 14.0–49.7)
MCH: 29.4 pg (ref 25.1–34.0)
MCHC: 32.4 g/dL (ref 31.5–36.0)
MCV: 90.6 fL (ref 79.5–101.0)
MONO#: 0.3 10*3/uL (ref 0.1–0.9)
MONO%: 5.5 % (ref 0.0–14.0)
NEUT#: 3 10*3/uL (ref 1.5–6.5)
NEUT%: 60 % (ref 38.4–76.8)
PLATELETS: 148 10*3/uL (ref 145–400)
RBC: 3.71 10*6/uL (ref 3.70–5.45)
RDW: 15 % — ABNORMAL HIGH (ref 11.2–14.5)
RETIC CT ABS: 38.96 10*3/uL (ref 33.70–90.70)
Retic %: 1.05 % (ref 0.70–2.10)
WBC: 4.9 10*3/uL (ref 3.9–10.3)

## 2016-07-12 NOTE — Progress Notes (Signed)
Labs today, Hgb at 10.9,  Injection held per Dr. Alvy Bimler

## 2016-07-13 ENCOUNTER — Telehealth: Payer: Self-pay

## 2016-07-13 NOTE — Telephone Encounter (Signed)
Left message for [name] with [facility] giving verbal order to continue [facility] PT 2 x a week for one week to make up previous missed time.

## 2016-07-13 NOTE — Telephone Encounter (Signed)
Eddie PT with [facility] left v/m requesting verbal order to extend PT [facility] for 2 x a week for 1 week to make up time missed previously.

## 2016-07-15 DIAGNOSIS — R262 Difficulty in walking, not elsewhere classified: Secondary | ICD-10-CM | POA: Diagnosis not present

## 2016-07-15 DIAGNOSIS — R488 Other symbolic dysfunctions: Secondary | ICD-10-CM | POA: Diagnosis not present

## 2016-07-15 DIAGNOSIS — E139 Other specified diabetes mellitus without complications: Secondary | ICD-10-CM | POA: Diagnosis not present

## 2016-07-16 DIAGNOSIS — E139 Other specified diabetes mellitus without complications: Secondary | ICD-10-CM | POA: Diagnosis not present

## 2016-07-16 DIAGNOSIS — R488 Other symbolic dysfunctions: Secondary | ICD-10-CM | POA: Diagnosis not present

## 2016-07-16 DIAGNOSIS — R262 Difficulty in walking, not elsewhere classified: Secondary | ICD-10-CM | POA: Diagnosis not present

## 2016-07-22 DIAGNOSIS — S72114D Nondisplaced fracture of greater trochanter of right femur, subsequent encounter for closed fracture with routine healing: Secondary | ICD-10-CM | POA: Diagnosis not present

## 2016-07-22 DIAGNOSIS — M25551 Pain in right hip: Secondary | ICD-10-CM | POA: Diagnosis not present

## 2016-07-28 ENCOUNTER — Other Ambulatory Visit (HOSPITAL_BASED_OUTPATIENT_CLINIC_OR_DEPARTMENT_OTHER): Payer: Medicare Other

## 2016-07-28 ENCOUNTER — Ambulatory Visit (HOSPITAL_BASED_OUTPATIENT_CLINIC_OR_DEPARTMENT_OTHER): Payer: Medicare Other | Admitting: Hematology and Oncology

## 2016-07-28 ENCOUNTER — Encounter: Payer: Self-pay | Admitting: Hematology and Oncology

## 2016-07-28 ENCOUNTER — Ambulatory Visit: Payer: Medicare Other

## 2016-07-28 ENCOUNTER — Telehealth: Payer: Self-pay | Admitting: Hematology and Oncology

## 2016-07-28 VITALS — BP 141/42 | HR 47 | Temp 98.0°F | Resp 16 | Ht 63.0 in | Wt 182.9 lb

## 2016-07-28 DIAGNOSIS — N189 Chronic kidney disease, unspecified: Secondary | ICD-10-CM

## 2016-07-28 DIAGNOSIS — D631 Anemia in chronic kidney disease: Secondary | ICD-10-CM | POA: Diagnosis not present

## 2016-07-28 DIAGNOSIS — D509 Iron deficiency anemia, unspecified: Secondary | ICD-10-CM

## 2016-07-28 DIAGNOSIS — I10 Essential (primary) hypertension: Secondary | ICD-10-CM

## 2016-07-28 DIAGNOSIS — N184 Chronic kidney disease, stage 4 (severe): Principal | ICD-10-CM

## 2016-07-28 LAB — CBC & DIFF AND RETIC
BASO%: 0.6 % (ref 0.0–2.0)
BASOS ABS: 0 10*3/uL (ref 0.0–0.1)
EOS ABS: 0.3 10*3/uL (ref 0.0–0.5)
EOS%: 6.4 % (ref 0.0–7.0)
HEMATOCRIT: 33.7 % — AB (ref 34.8–46.6)
HGB: 10.6 g/dL — ABNORMAL LOW (ref 11.6–15.9)
IMMATURE RETIC FRACT: 4.3 % (ref 1.60–10.00)
LYMPH#: 1.3 10*3/uL (ref 0.9–3.3)
LYMPH%: 25.2 % (ref 14.0–49.7)
MCH: 28.8 pg (ref 25.1–34.0)
MCHC: 31.5 g/dL (ref 31.5–36.0)
MCV: 91.6 fL (ref 79.5–101.0)
MONO#: 0.3 10*3/uL (ref 0.1–0.9)
MONO%: 5.5 % (ref 0.0–14.0)
NEUT#: 3.3 10*3/uL (ref 1.5–6.5)
NEUT%: 62.3 % (ref 38.4–76.8)
PLATELETS: 149 10*3/uL (ref 145–400)
RBC: 3.68 10*6/uL — AB (ref 3.70–5.45)
RDW: 14.5 % (ref 11.2–14.5)
RETIC CT ABS: 29.44 10*3/uL — AB (ref 33.70–90.70)
Retic %: 0.8 % (ref 0.70–2.10)
WBC: 5.3 10*3/uL (ref 3.9–10.3)

## 2016-07-28 NOTE — Telephone Encounter (Signed)
Appointments scheduled per 11/1 LOS. Patient given AVS report and calendars with future scheduled appointments.  °

## 2016-07-28 NOTE — Assessment & Plan Note (Signed)
she will continue current medical management. Her blood pressure is suboptimally controlled. I recommend close follow-up with primary care doctor for medication adjustment.  

## 2016-07-28 NOTE — Progress Notes (Signed)
[place], [place] SUMMARY OF HEMATOLOGIC HISTORY:  [name] is here because of severe anemia. The patient have chronic anemia on and off for the past few years. She has undergone extensive evaluation in the past. Early 2015, she was found to have severe anemia with hemoglobin of 6.8 and underwent extensive evaluation and was found to have iron deficiency. She was placed on oral iron supplement which she takes 2 a day for a while. She also received blood transfusion in the past. She did not undergo repeat GI evaluation last year. Her last colonoscopy was in 2009. Recently, she was hospitalized for pneumonia between 09/15/2015 to 09/19/2015. She had acute mental status change and acute renal failure which improved with conservative management She had a low hemoglobin of 6.4 and received blood transfusion in the hospital. She received antibiotic for pneumonia. At present time, she complained of fatigue. She denies recent chest pain on exertion, shortness of breath on minimal exertion, pre-syncopal episodes, or palpitations. She had not noticed any recent bleeding such as epistaxis, hematuria or hematochezia The patient denies over the counter NSAID ingestion. She is on antiplatelets agents. Her last colonoscopy was in 2009. She does not recall having EGD She denies any pica and eats a variety of diet. She never donated blood She also have interesting history of right breast cancer, discovered when she was 32 and premenopausal. From her collection, she may have stage II disease due to regional lymph nodes involvement. She had mastectomy and complete lymph node dissection on the right axilla. She recalled receiving some form of chemotherapy but never received tamoxifen or radiation. In the 90s, she was diagnosed with uterine cancer due to abnormal postmenopausal bleeding. She had complete hysterectomy but never received adjuvant  treatment. On 10/02/2015, she received blood transfusion for severe anemia On 10/09/2015, she started on Aranesp injection The patient has nondisplaced right hip fracture in August and was hospitalized. That was managed conservatively  INTERVAL HISTORY: [name] 82 y.o. female returns for follow-up. She has completely healed from recent hip fracture. She has excellent energy level. The patient denies any recent signs or symptoms of bleeding such as spontaneous epistaxis, hematuria or hematochezia.   I have reviewed the past medical history, past surgical history, social history and family history with the patient and they are unchanged from previous note.  ALLERGIES:  is allergic to levofloxacin; morphine and related; and tape.  MEDICATIONS:  Current Outpatient Prescriptions  Medication Sig Dispense Refill  . aspirin 81 MG tablet Take 81 mg by mouth daily.      Marland Kitchen atorvastatin (LIPITOR) 80 MG tablet TAKE 1 TABLET BY MOUTH EVERY DAY 30 tablet 4  . Blood Glucose Monitoring Suppl (ACCU-CHEK AVIVA) device Use to check blood sugar once daily.  Dx: E11.43 1 each 0  . gabapentin (NEURONTIN) 100 MG capsule Take 200 mg by mouth at bedtime. Can increase to 300 mg at bedtime if still having neuropathy.    Marland Kitchen glipiZIDE (GLIPIZIDE XL) 5 MG 24 hr tablet Take 5 mg by mouth daily with breakfast.    . glucose blood (ACCU-CHEK AVIVA) test strip Use to check blood sugar once daily.  Dx: E11.43 100 each 3  . hydrochlorothiazide (MICROZIDE) 12.5 MG capsule Take 1 capsule (12.5 mg total) by mouth daily. 30 capsule 0  . HYDROcodone-acetaminophen (NORCO/VICODIN) 5-325 MG tablet Take 1 tablet by mouth every 4 (four) hours as needed for moderate pain. 40 tablet 0  .  Lancet Devices (ACCU-CHEK SOFTCLIX) lancets Use to check blood sugar once daily.  Dx: E11.43 1 each 0  . levothyroxine (SYNTHROID, LEVOTHROID) 50 MCG tablet TAKE 1 TABLET BY MOUTH EVERY DAY 30 tablet 5  . metoprolol succinate (TOPROL-XL) 25 MG  24 hr tablet Take 25 mg by mouth 2 (two) times daily.    . nitroGLYCERIN (NITROSTAT) 0.4 MG SL tablet Place 1 tablet (0.4 mg total) under the tongue every 5 (five) minutes as needed for chest pain (x 3 tabs daily). 25 tablet 3  . pioglitazone (ACTOS) 30 MG tablet Take 15 mg by mouth daily.    . sertraline (ZOLOFT) 25 MG tablet TAKE 1 TABLET (25 MG TOTAL) BY MOUTH DAILY. 30 tablet 5   No current facility-administered medications for this visit.      REVIEW OF SYSTEMS:   Constitutional: Denies fevers, chills or night sweats Eyes: Denies blurriness of vision Ears, nose, mouth, throat, and face: Denies mucositis or sore throat Respiratory: Denies cough, dyspnea or wheezes Cardiovascular: Denies palpitation, chest discomfort or lower extremity swelling Gastrointestinal:  Denies nausea, heartburn or change in bowel habits Skin: Denies abnormal skin rashes Lymphatics: Denies new lymphadenopathy or easy bruising Neurological:Denies numbness, tingling or new weaknesses Behavioral/Psych: Mood is stable, no new changes  All other systems were reviewed with the patient and are negative.  PHYSICAL EXAMINATION: ECOG PERFORMANCE STATUS: 0 - Asymptomatic  Vitals:   07/28/16 1130  BP: (!) 141/42  Pulse: (!) 47  Resp: 16  Temp: 98 F (36.7 C)   Filed Weights   07/28/16 1130  Weight: 182 lb 14.4 oz (83 kg)    GENERAL:alert, no distress and comfortable SKIN: skin color, texture, turgor are normal, no rashes or significant lesions EYES: normal, Conjunctiva are pink and non-injected, sclera clear Musculoskeletal:no cyanosis of digits and no clubbing  NEURO: alert & oriented x 3 with fluent speech, no focal motor/sensory deficits  LABORATORY DATA:  I have reviewed the data as listed     Component Value Date/Time   NA 141 06/17/2016 1147   K 4.0 06/17/2016 1147   CL 101 05/17/2016 0534   CO2 25 06/17/2016 1147   GLUCOSE 163 (H) 06/17/2016 1147   BUN 25.8 06/17/2016 1147   CREATININE 1.3  (H) 06/17/2016 1147   CALCIUM 9.0 06/17/2016 1147   PROT 6.2 (L) 06/17/2016 1147   ALBUMIN 2.9 (L) 06/17/2016 1147   AST 13 06/17/2016 1147   ALT 9 06/17/2016 1147   ALKPHOS 119 06/17/2016 1147   BILITOT 0.70 06/17/2016 1147   GFRNONAA 31 (L) 05/17/2016 0534   GFRAA 36 (L) 05/17/2016 0534    No results found for: SPEP, UPEP  Lab Results  Component Value Date   WBC 5.3 07/28/2016   NEUTROABS 3.3 07/28/2016   HGB 10.6 (L) 07/28/2016   HCT 33.7 (L) 07/28/2016   MCV 91.6 07/28/2016   PLT 149 07/28/2016      Chemistry      Component Value Date/Time   NA 141 06/17/2016 1147   K 4.0 06/17/2016 1147   CL 101 05/17/2016 0534   CO2 25 06/17/2016 1147   BUN 25.8 06/17/2016 1147   CREATININE 1.3 (H) 06/17/2016 1147      Component Value Date/Time   CALCIUM 9.0 06/17/2016 1147   ALKPHOS 119 06/17/2016 1147   AST 13 06/17/2016 1147   ALT 9 06/17/2016 1147   BILITOT 0.70 06/17/2016 1147      ASSESSMENT & PLAN:  Anemia due to chronic renal failure  treated with erythropoietin She is doing very well with reduced requirement for Aranesp injection I  will proceed to modify her treatment so that she will only come every other month. I will reassess again in 6 months   Essential hypertension she will continue current medical management. Her blood pressure is suboptimally controlled. I recommend close follow-up with primary care doctor for medication adjustment.    Orders Placed This Encounter  Procedures  . Ferritin    Standing Status:   Future    Standing Expiration Date:   09/01/2017  . Iron and TIBC    Standing Status:   Future    Standing Expiration Date:   09/01/2017  . Erythropoietin    Standing Status:   Future    Standing Expiration Date:   09/01/2017  . Vitamin B12    Standing Status:   Future    Standing Expiration Date:   09/01/2017  . CBC with Differential/Platelet    Standing Status:   Standing    Number of Occurrences:   9    Standing Expiration Date:    07/28/2017    All questions were answered. The patient knows to call the clinic with any problems, questions or concerns. No barriers to learning was detected.  I spent 10 minutes counseling the patient face to face. The total time spent in the appointment was 15 minutes and more than 50% was on counseling.     Heath Lark, MD 11/1/20176:27 PM

## 2016-07-28 NOTE — Assessment & Plan Note (Signed)
She is doing very well with reduced requirement for Aranesp injection I  will proceed to modify her treatment so that she will only come every other month. I will reassess again in 6 months

## 2016-08-12 ENCOUNTER — Telehealth: Payer: Self-pay | Admitting: *Deleted

## 2016-08-12 ENCOUNTER — Other Ambulatory Visit: Payer: Self-pay | Admitting: *Deleted

## 2016-08-12 ENCOUNTER — Ambulatory Visit: Payer: Medicare Other

## 2016-08-12 ENCOUNTER — Other Ambulatory Visit (HOSPITAL_BASED_OUTPATIENT_CLINIC_OR_DEPARTMENT_OTHER): Payer: Medicare Other

## 2016-08-12 DIAGNOSIS — R300 Dysuria: Secondary | ICD-10-CM

## 2016-08-12 DIAGNOSIS — D631 Anemia in chronic kidney disease: Secondary | ICD-10-CM

## 2016-08-12 DIAGNOSIS — N189 Chronic kidney disease, unspecified: Secondary | ICD-10-CM

## 2016-08-12 DIAGNOSIS — N184 Chronic kidney disease, stage 4 (severe): Principal | ICD-10-CM

## 2016-08-12 LAB — CBC WITH DIFFERENTIAL/PLATELET
BASO%: 1 % (ref 0.0–2.0)
Basophils Absolute: 0.1 10*3/uL (ref 0.0–0.1)
EOS ABS: 0.3 10*3/uL (ref 0.0–0.5)
EOS%: 4.3 % (ref 0.0–7.0)
HEMATOCRIT: 38 % (ref 34.8–46.6)
HEMOGLOBIN: 12.5 g/dL (ref 11.6–15.9)
LYMPH#: 1.3 10*3/uL (ref 0.9–3.3)
LYMPH%: 19.8 % (ref 14.0–49.7)
MCH: 29.5 pg (ref 25.1–34.0)
MCHC: 32.9 g/dL (ref 31.5–36.0)
MCV: 89.6 fL (ref 79.5–101.0)
MONO#: 0.4 10*3/uL (ref 0.1–0.9)
MONO%: 6.6 % (ref 0.0–14.0)
NEUT#: 4.6 10*3/uL (ref 1.5–6.5)
NEUT%: 68.3 % (ref 38.4–76.8)
Platelets: 153 10*3/uL (ref 145–400)
RBC: 4.24 10*6/uL (ref 3.70–5.45)
RDW: 14.2 % (ref 11.2–14.5)
WBC: 6.7 10*3/uL (ref 3.9–10.3)
nRBC: 0 % (ref 0–0)

## 2016-08-12 LAB — URINALYSIS, MICROSCOPIC - CHCC
Bilirubin (Urine): NEGATIVE
Glucose: NEGATIVE mg/dL
Ketones: NEGATIVE mg/dL
NITRITE: POSITIVE
Protein: 300 mg/dL
SPECIFIC GRAVITY, URINE: 1.015 (ref 1.003–1.035)
UROBILINOGEN UR: 0.2 mg/dL (ref 0.2–1)
pH: 6 (ref 4.6–8.0)

## 2016-08-12 MED ORDER — AMOXICILLIN-POT CLAVULANATE 875-125 MG PO TABS: 1.0000 | ORAL_TABLET | Freq: Two times a day (BID) | ORAL | 0 refills | Status: DC

## 2016-08-12 NOTE — Telephone Encounter (Signed)
-----   Message from Heath Lark, MD sent at 08/12/2016 11:53 AM EST ----- Regarding: UTI Urinalysis looks abnormal. Likely another UTI Please call in prescription Augmentin 875 mg BID PO x 7 days Will call her if sensitivity stated otherwise ----- Message ----- From: Interface, Lab In Three Zero One Sent: 08/12/2016  11:29 AM To: Heath Lark, MD

## 2016-08-12 NOTE — Telephone Encounter (Signed)
Informed pt of lab results.  No need for aranesp today.  Pt had c/o feeling tired and asked for urinalysis to be done when she checked in for her appt today.  Informed pt of U/A results and Dr. Calton Dach note. Instructed to start on antibiotic today and drink plenty of fluids. Call her PCP if her symptoms worsen or condition does not improve.  Pt verbalized understanding.

## 2016-08-14 LAB — URINE CULTURE

## 2016-08-16 ENCOUNTER — Telehealth: Payer: Self-pay | Admitting: *Deleted

## 2016-08-16 NOTE — Telephone Encounter (Signed)
LM with note below. To call if has questions 

## 2016-08-16 NOTE — Telephone Encounter (Signed)
-----   Message from Heath Lark, MD sent at 08/16/2016  9:24 AM EST ----- Regarding: urine culture pls let her know urine culture is sensitive to augmentin Hope she feels better ----- Message ----- From: Interface, Lab In Three Zero One Sent: 08/12/2016  11:29 AM To: Heath Lark, MD

## 2016-08-24 ENCOUNTER — Ambulatory Visit: Payer: Medicare Other | Admitting: Family Medicine

## 2016-08-26 ENCOUNTER — Encounter: Payer: Self-pay | Admitting: Family Medicine

## 2016-08-26 ENCOUNTER — Ambulatory Visit (INDEPENDENT_AMBULATORY_CARE_PROVIDER_SITE_OTHER): Payer: Medicare Other | Admitting: Family Medicine

## 2016-08-26 VITALS — BP 148/58 | HR 49 | Temp 97.5°F | Ht 63.0 in | Wt 176.5 lb

## 2016-08-26 DIAGNOSIS — E0843 Diabetes mellitus due to underlying condition with diabetic autonomic (poly)neuropathy: Secondary | ICD-10-CM

## 2016-08-26 DIAGNOSIS — E785 Hyperlipidemia, unspecified: Secondary | ICD-10-CM

## 2016-08-26 DIAGNOSIS — I10 Essential (primary) hypertension: Secondary | ICD-10-CM | POA: Diagnosis not present

## 2016-08-26 DIAGNOSIS — E0840 Diabetes mellitus due to underlying condition with diabetic neuropathy, unspecified: Secondary | ICD-10-CM | POA: Diagnosis not present

## 2016-08-26 LAB — COMPREHENSIVE METABOLIC PANEL
ALT: 15 U/L (ref 0–35)
AST: 14 U/L (ref 0–37)
Albumin: 3.6 g/dL (ref 3.5–5.2)
Alkaline Phosphatase: 84 U/L (ref 39–117)
BUN: 40 mg/dL — ABNORMAL HIGH (ref 6–23)
CALCIUM: 9.5 mg/dL (ref 8.4–10.5)
CHLORIDE: 104 meq/L (ref 96–112)
CO2: 26 meq/L (ref 19–32)
Creatinine, Ser: 1.44 mg/dL — ABNORMAL HIGH (ref 0.40–1.20)
GFR: 37.1 mL/min — AB (ref >60.00)
GLUCOSE: 114 mg/dL — AB (ref 70–99)
POTASSIUM: 4 meq/L (ref 3.5–5.1)
Sodium: 140 mEq/L (ref 135–145)
Total Bilirubin: 0.5 mg/dL (ref 0.2–1.2)
Total Protein: 6.8 g/dL (ref 6.0–8.3)

## 2016-08-26 LAB — LIPID PANEL
CHOL/HDL RATIO: 5
CHOLESTEROL: 217 mg/dL — AB (ref 0–200)
HDL: 43 mg/dL (ref >39.00)
NONHDL: 173.58
TRIGLYCERIDES: 218 mg/dL — AB (ref 0.0–149.0)
VLDL: 43.6 mg/dL — AB (ref 0.0–40.0)

## 2016-08-26 LAB — HEMOGLOBIN A1C: Hgb A1c MFr Bld: 6.8 % — ABNORMAL HIGH (ref 4.6–6.5)

## 2016-08-26 LAB — HM DIABETES FOOT EXAM

## 2016-08-26 LAB — LDL CHOLESTEROL, DIRECT: Direct LDL: 120 mg/dL

## 2016-08-26 NOTE — Patient Instructions (Addendum)
Set up yearly eye exam after 10/21/2016. Stop at lab on way out.. for chol and DM tests.  Follow BP at home.. Call if regualrly

## 2016-08-26 NOTE — Assessment & Plan Note (Signed)
Tolerate higher dos given age but elevated at office today. Pt reports < 140/90 at home.

## 2016-08-26 NOTE — Assessment & Plan Note (Signed)
Appears well controlled. Due for a1c.

## 2016-08-26 NOTE — Progress Notes (Signed)
Subjective:    Patient ID: [name], female    DOB: 1933-04-03, 82 y.o.   MRN: [id number]  HPI   82 year old female presents for follow up 3 months, DM, cholesterol   Summary of last OV: Fall on 8/18 /2017 resulting in hospitalization for  nondisplaced right greater trochanter fracture. On side of previous hip prosthesis.  Followed by [facility],  Referred to SNF on discharge on 05/17/2016 for PT. During hospitalization anemia, CAD, DM was stable. Cholesterol was not at goal LDl 235.Marland Kitchen So admitting physician increase lipitor to 80 mg daily. With CKD stage 3 .. Cozaar and diuretic held.  In rehab DM was controlled with insulin which she did not like.  At D/C by phone we changed her back to her oral DM meds. Since D/C from rehab on  06/06/2015.  Was in  [place] staying with daughter.    Diabetes:  Due for re-eval of A1C. Using medications without difficulties: Hypoglycemic episodes: none Hyperglycemic episodes: none Feet problems: none Blood Sugars averaging: FBS 93-95 eye exam within last year: yes  Elevated Cholesterol: Due for re-eval on higher dose atorvastatin. Using medications without problems: None Muscle aches:  none Diet compliance: good  Exercise:walking some Other complaints:  Hypertension:   Inadequate control  on cozaar, metoprolol and HCTZ BP Readings from Last 3 Encounters:  08/26/16 (!) 161/61  07/28/16 (!) 141/42  06/22/16 135/86  Using medication without problems or lightheadedness:  None Chest pain with exertion: none Edema:None Short of breath:  ssable Average home BPs:132/60 at home. Other issues:   Wt Readings from Last 3 Encounters:  08/26/16 176 lb 8 oz (80.1 kg)  07/28/16 182 lb 14.4 oz (83 kg)  06/22/16 181 lb (82.1 kg)     Fatigue: at last OV tried to decrease daytime fatigue by moving gabapentin to PM  Increased gradually to 200  Mg  for better neuropathy control at bedtime.. Now pain is improved. Sleeping better at night.  Now  energy was better in last 3 days.   Recent UTI symptoms resolved.. No mental status changes.   Review of Systems  Constitutional: Negative for fatigue and fever.  HENT: Negative for ear pain.   Eyes: Negative for pain.  Respiratory: Negative for chest tightness and shortness of breath.   Cardiovascular: Negative for chest pain, palpitations and leg swelling.  Gastrointestinal: Negative for abdominal pain.  Genitourinary: Negative for dysuria.       Objective:   Physical Exam  Constitutional: Vital signs are normal. She appears well-developed and well-nourished. She is cooperative.  Non-toxic appearance. She does not appear ill. No distress.  Elderly female in NAD  HENT:  Head: Normocephalic.  Right Ear: Hearing, tympanic membrane, external ear and ear canal normal. Tympanic membrane is not erythematous, not retracted and not bulging.  Left Ear: Hearing, tympanic membrane, external ear and ear canal normal. Tympanic membrane is not erythematous, not retracted and not bulging.  Nose: No mucosal edema or rhinorrhea. Right sinus exhibits no maxillary sinus tenderness and no frontal sinus tenderness. Left sinus exhibits no maxillary sinus tenderness and no frontal sinus tenderness.  Mouth/Throat: Uvula is midline, oropharynx is clear and moist and mucous membranes are normal.  Eyes: Conjunctivae, EOM and lids are normal. Pupils are equal, round, and reactive to light. Lids are everted and swept, no foreign bodies found.  Neck: Trachea normal and normal range of motion. Neck supple. Carotid bruit is not present. No thyroid mass and no thyromegaly present.  Cardiovascular:  Normal rate, regular rhythm, S1 normal, S2 normal, normal heart sounds, intact distal pulses and normal pulses.  Exam reveals no gallop and no friction rub.   No murmur heard. Pulmonary/Chest: Effort normal and breath sounds normal. No tachypnea. No respiratory distress. She has no decreased breath sounds. She has no  wheezes. She has no rhonchi. She has no rales.  Abdominal: Soft. Normal appearance and bowel sounds are normal. There is no tenderness.  Neurological: She is alert.  Skin: Skin is warm, dry and intact. No rash noted.  Psychiatric: Her speech is normal and behavior is normal. Judgment and thought content normal. Her mood appears not anxious. Cognition and memory are normal. She does not exhibit a depressed mood.    Diabetic foot exam: Normal inspection No skin breakdown No calluses  Normal DP pulses Normal sensation to light touch and monofilament Nails normal       Assessment & Plan:

## 2016-08-26 NOTE — Assessment & Plan Note (Signed)
Due for re-eval on higher dose atorvastatin.

## 2016-08-26 NOTE — Assessment & Plan Note (Signed)
Improved control on higher dose of gabapentin at bedtime. Less fatigue during the day.

## 2016-08-26 NOTE — Progress Notes (Signed)
Pre visit review using our clinic review tool, if applicable. No additional management support is needed unless otherwise documented below in the visit note. 

## 2016-08-27 ENCOUNTER — Encounter: Payer: Self-pay | Admitting: *Deleted

## 2016-09-09 ENCOUNTER — Other Ambulatory Visit: Payer: Self-pay | Admitting: Family Medicine

## 2016-09-09 NOTE — Telephone Encounter (Signed)
Last office visit 08/26/2016.  Not on current medication list.  Refill?

## 2016-09-25 ENCOUNTER — Other Ambulatory Visit: Payer: Self-pay | Admitting: Cardiology

## 2016-09-28 NOTE — Telephone Encounter (Signed)
Rx has been sent to the pharmacy electronically. ° °

## 2016-10-11 ENCOUNTER — Other Ambulatory Visit (HOSPITAL_BASED_OUTPATIENT_CLINIC_OR_DEPARTMENT_OTHER): Payer: Medicare Other

## 2016-10-11 ENCOUNTER — Ambulatory Visit (HOSPITAL_BASED_OUTPATIENT_CLINIC_OR_DEPARTMENT_OTHER): Payer: Medicare Other

## 2016-10-11 ENCOUNTER — Other Ambulatory Visit: Payer: Self-pay | Admitting: *Deleted

## 2016-10-11 VITALS — BP 150/57 | HR 60 | Temp 97.8°F | Resp 18

## 2016-10-11 DIAGNOSIS — D631 Anemia in chronic kidney disease: Secondary | ICD-10-CM

## 2016-10-11 DIAGNOSIS — N184 Chronic kidney disease, stage 4 (severe): Secondary | ICD-10-CM | POA: Diagnosis not present

## 2016-10-11 DIAGNOSIS — N183 Chronic kidney disease, stage 3 unspecified: Secondary | ICD-10-CM

## 2016-10-11 DIAGNOSIS — N189 Chronic kidney disease, unspecified: Secondary | ICD-10-CM

## 2016-10-11 LAB — CBC WITH DIFFERENTIAL/PLATELET
BASO%: 0.8 % (ref 0.0–2.0)
Basophils Absolute: 0.1 10*3/uL (ref 0.0–0.1)
EOS%: 7.5 % — AB (ref 0.0–7.0)
Eosinophils Absolute: 0.5 10*3/uL (ref 0.0–0.5)
HEMATOCRIT: 30.3 % — AB (ref 34.8–46.6)
HEMOGLOBIN: 10 g/dL — AB (ref 11.6–15.9)
LYMPH#: 1.3 10*3/uL (ref 0.9–3.3)
LYMPH%: 22.3 % (ref 14.0–49.7)
MCH: 30.1 pg (ref 25.1–34.0)
MCHC: 33 g/dL (ref 31.5–36.0)
MCV: 91.3 fL (ref 79.5–101.0)
MONO#: 0.3 10*3/uL (ref 0.1–0.9)
MONO%: 5.5 % (ref 0.0–14.0)
NEUT#: 3.8 10*3/uL (ref 1.5–6.5)
NEUT%: 63.9 % (ref 38.4–76.8)
Platelets: 189 10*3/uL (ref 145–400)
RBC: 3.32 10*6/uL — ABNORMAL LOW (ref 3.70–5.45)
RDW: 16.7 % — AB (ref 11.2–14.5)
WBC: 6 10*3/uL (ref 3.9–10.3)

## 2016-10-11 LAB — IRON AND TIBC
%SAT: 55 % (ref 21–57)
Iron: 190 ug/dL — ABNORMAL HIGH (ref 41–142)
TIBC: 348 ug/dL (ref 236–444)
UIBC: 158 ug/dL (ref 120–384)

## 2016-10-11 LAB — FERRITIN: FERRITIN: 66 ng/mL (ref 9–269)

## 2016-10-11 MED ORDER — DARBEPOETIN ALFA 200 MCG/0.4ML IJ SOSY
200.0000 ug | PREFILLED_SYRINGE | Freq: Once | INTRAMUSCULAR | Status: AC
  Administered 2016-10-11: 200 ug via SUBCUTANEOUS
  Filled 2016-10-11: qty 0.4

## 2016-10-11 MED ORDER — ATORVASTATIN CALCIUM 80 MG PO TABS: 80.0000 mg | ORAL_TABLET | Freq: Every day | ORAL | 3 refills | Status: AC

## 2016-10-11 NOTE — Telephone Encounter (Signed)
Last office visit 08/26/2016.  Last refilled 05/10/2016 for #90 with 3 refills. Pharmacy is requesting a 90 day supply.  Ok to refill and increase quantity to #270?

## 2016-10-11 NOTE — Patient Instructions (Signed)
Darbepoetin Alfa injection What is this medicine? DARBEPOETIN ALFA (dar be POE e tin AL fa) helps your body make more red blood cells. It is used to treat anemia caused by chronic kidney failure and chemotherapy. This medicine may be used for other purposes; ask your health care provider or pharmacist if you have questions. What should I tell my health care provider before I take this medicine? They need to know if you have any of these conditions: -blood clotting disorders or history of blood clots -cancer patient not on chemotherapy -cystic fibrosis -heart disease, such as angina, heart failure, or a history of a heart attack -hemoglobin level of 12 g/dL or greater -high blood pressure -low levels of folate, iron, or vitamin B12 -seizures -an unusual or allergic reaction to darbepoetin, erythropoietin, albumin, hamster proteins, latex, other medicines, foods, dyes, or preservatives -pregnant or trying to get pregnant -breast-feeding How should I use this medicine? This medicine is for injection into a vein or under the skin. It is usually given by a health care professional in a hospital or clinic setting. If you get this medicine at home, you will be taught how to prepare and give this medicine. Do not shake the solution before you withdraw a dose. Use exactly as directed. Take your medicine at regular intervals. Do not take your medicine more often than directed. It is important that you put your used needles and syringes in a special sharps container. Do not put them in a trash can. If you do not have a sharps container, call your pharmacist or healthcare provider to get one. Talk to your pediatrician regarding the use of this medicine in children. While this medicine may be used in children as young as 1 year for selected conditions, precautions do apply. Overdosage: If you think you have taken too much of this medicine contact a poison control center or emergency room at once. NOTE:  This medicine is only for you. Do not share this medicine with others. What if I miss a dose? If you miss a dose, take it as soon as you can. If it is almost time for your next dose, take only that dose. Do not take double or extra doses. What may interact with this medicine? Do not take this medicine with any of the following medications: -epoetin alfa This list may not describe all possible interactions. Give your health care provider a list of all the medicines, herbs, non-prescription drugs, or dietary supplements you use. Also tell them if you smoke, drink alcohol, or use illegal drugs. Some items may interact with your medicine. What should I watch for while using this medicine? Visit your prescriber or health care professional for regular checks on your progress and for the needed blood tests and blood pressure measurements. It is especially important for the doctor to make sure your hemoglobin level is in the desired range, to limit the risk of potential side effects and to give you the best benefit. Keep all appointments for any recommended tests. Check your blood pressure as directed. Ask your doctor what your blood pressure should be and when you should contact him or her. As your body makes more red blood cells, you may need to take iron, folic acid, or vitamin B supplements. Ask your doctor or health care provider which products are right for you. If you have kidney disease continue dietary restrictions, even though this medication can make you feel better. Talk with your doctor or health care professional about the   foods you eat and the vitamins that you take. What side effects may I notice from receiving this medicine? Side effects that you should report to your doctor or health care professional as soon as possible: -allergic reactions like skin rash, itching or hives, swelling of the face, lips, or tongue -breathing problems -changes in vision -chest pain -confusion, trouble speaking  or understanding -feeling faint or lightheaded, falls -high blood pressure -muscle aches or pains -pain, swelling, warmth in the leg -rapid weight gain -severe headaches -sudden numbness or weakness of the face, arm or leg -trouble walking, dizziness, loss of balance or coordination -seizures (convulsions) -swelling of the ankles, feet, hands -unusually weak or tired Side effects that usually do not require medical attention (report to your doctor or health care professional if they continue or are bothersome): -diarrhea -fever, chills (flu-like symptoms) -headaches -nausea, vomiting -redness, stinging, or swelling at site where injected This list may not describe all possible side effects. Call your doctor for medical advice about side effects. You may report side effects to FDA at 1-800-FDA-1088. Where should I keep my medicine? Keep out of the reach of children. Store in a refrigerator between 2 and 8 degrees C (36 and 46 degrees F). Do not freeze. Do not shake. Throw away any unused portion if using a single-dose vial. Throw away any unused medicine after the expiration date. NOTE: This sheet is a summary. It may not cover all possible information. If you have questions about this medicine, talk to your doctor, pharmacist, or health care provider.    2016, Elsevier/[employer] Standard. (2008-08-27 10:23:57)  

## 2016-10-12 LAB — VITAMIN B12

## 2016-10-12 LAB — ERYTHROPOIETIN: Erythropoietin: 35.8 m[IU]/mL — ABNORMAL HIGH (ref 2.6–18.5)

## 2016-10-12 MED ORDER — GABAPENTIN 100 MG PO CAPS: 200.0000 mg | ORAL_CAPSULE | Freq: Every day | ORAL | 1 refills | Status: AC

## 2016-11-06 ENCOUNTER — Other Ambulatory Visit: Payer: Self-pay | Admitting: Family Medicine

## 2016-11-08 ENCOUNTER — Other Ambulatory Visit: Payer: Self-pay

## 2016-11-12 ENCOUNTER — Ambulatory Visit: Payer: Self-pay | Admitting: Family Medicine

## 2016-11-16 ENCOUNTER — Other Ambulatory Visit: Payer: Self-pay | Admitting: Family Medicine

## 2016-11-20 ENCOUNTER — Other Ambulatory Visit: Payer: Self-pay | Admitting: Cardiology

## 2016-11-26 DIAGNOSIS — S335XXA Sprain of ligaments of lumbar spine, initial encounter: Secondary | ICD-10-CM | POA: Diagnosis not present

## 2016-11-29 ENCOUNTER — Telehealth: Payer: Self-pay | Admitting: Hematology and Oncology

## 2016-11-29 NOTE — Telephone Encounter (Signed)
Patient called to r/s lab and inj. Appt.  3/12 to 3/16 .Patient aware of new date and time .

## 2016-12-05 IMAGING — CR DG CHEST 2V
2 series · 2 of 2 positions shown · non-contrast
Comparison: Radiograph 01/28/2011

CLINICAL DATA: Cough, weakness, hyperglycemia

EXAM:
CHEST  2 VIEW

[w chest pa]
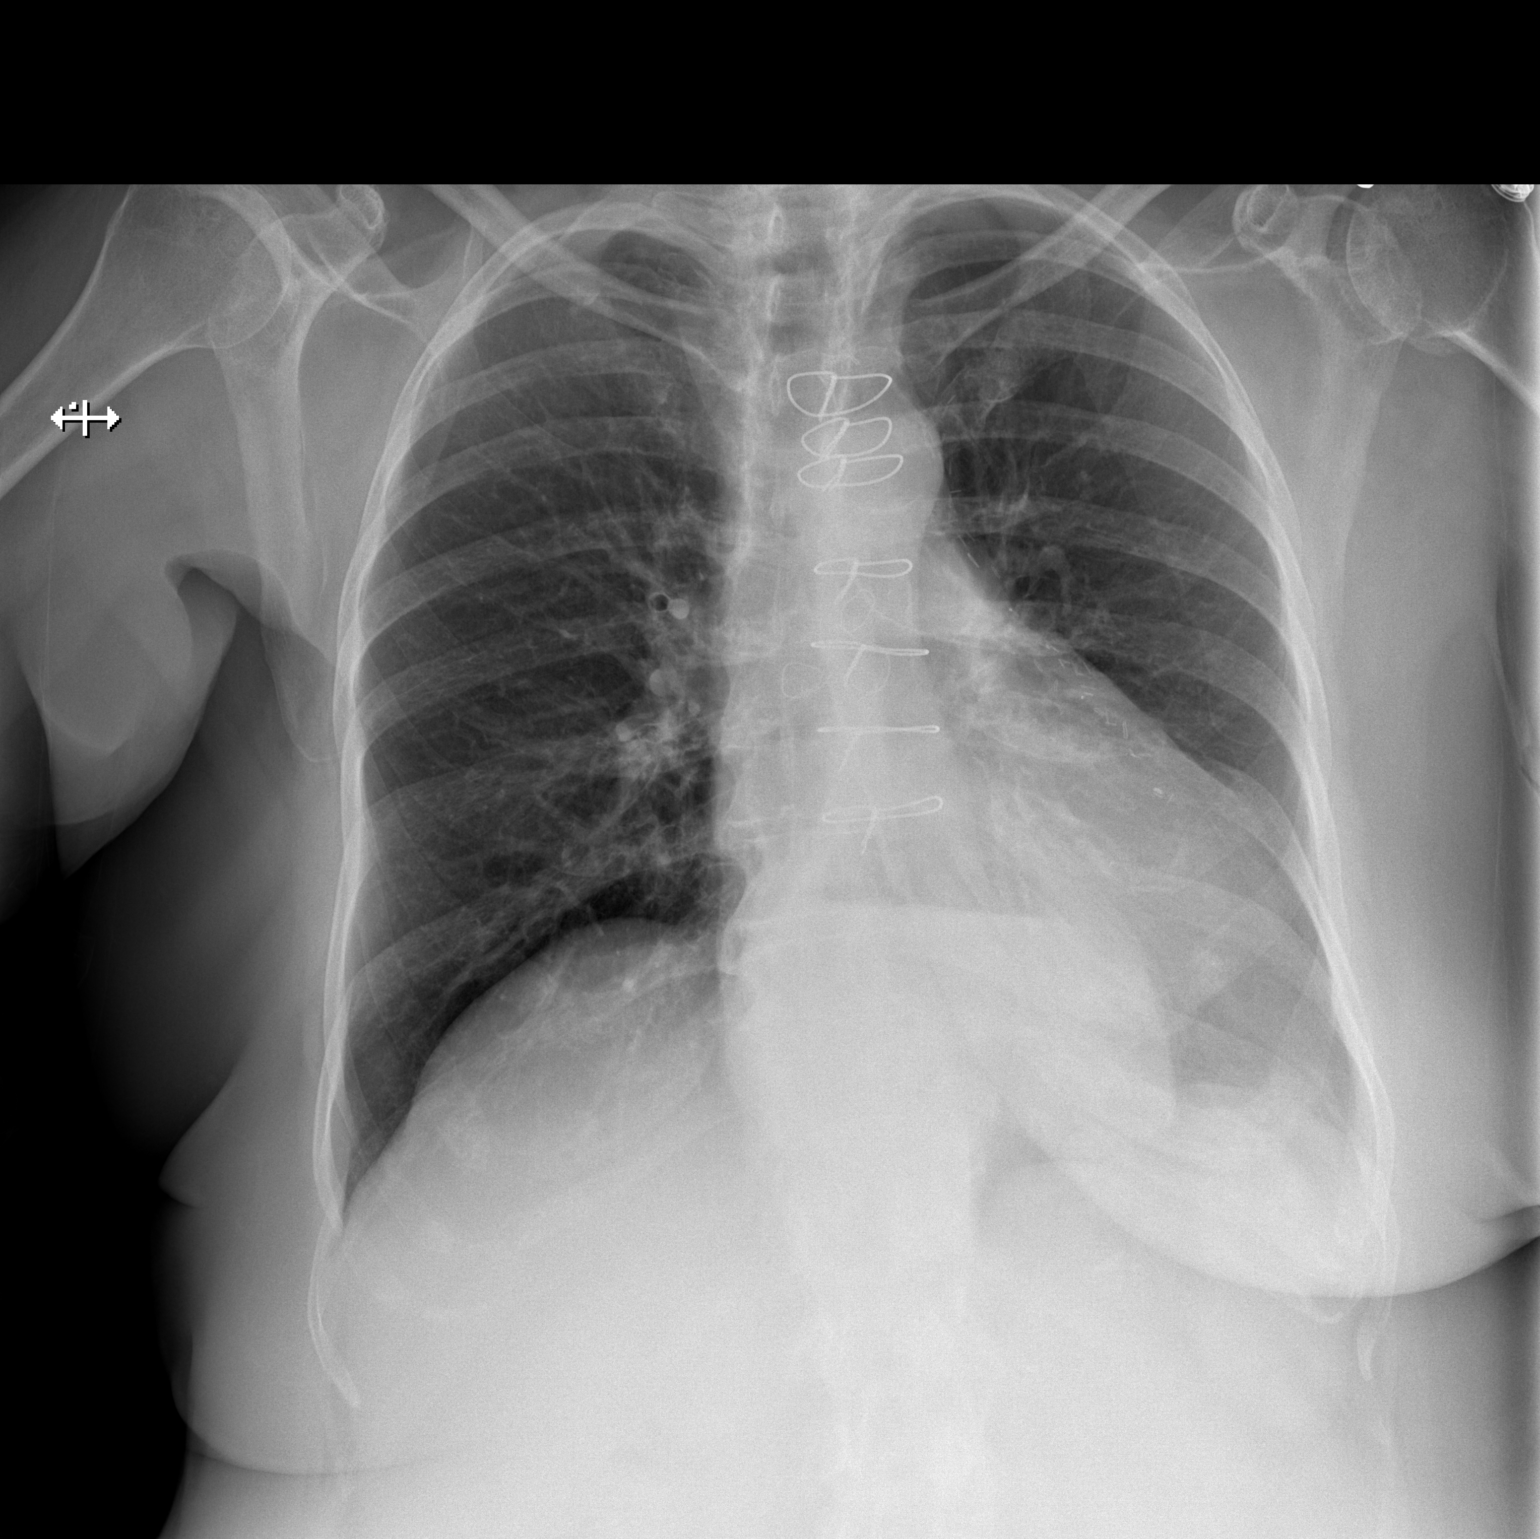

[w chest lat]
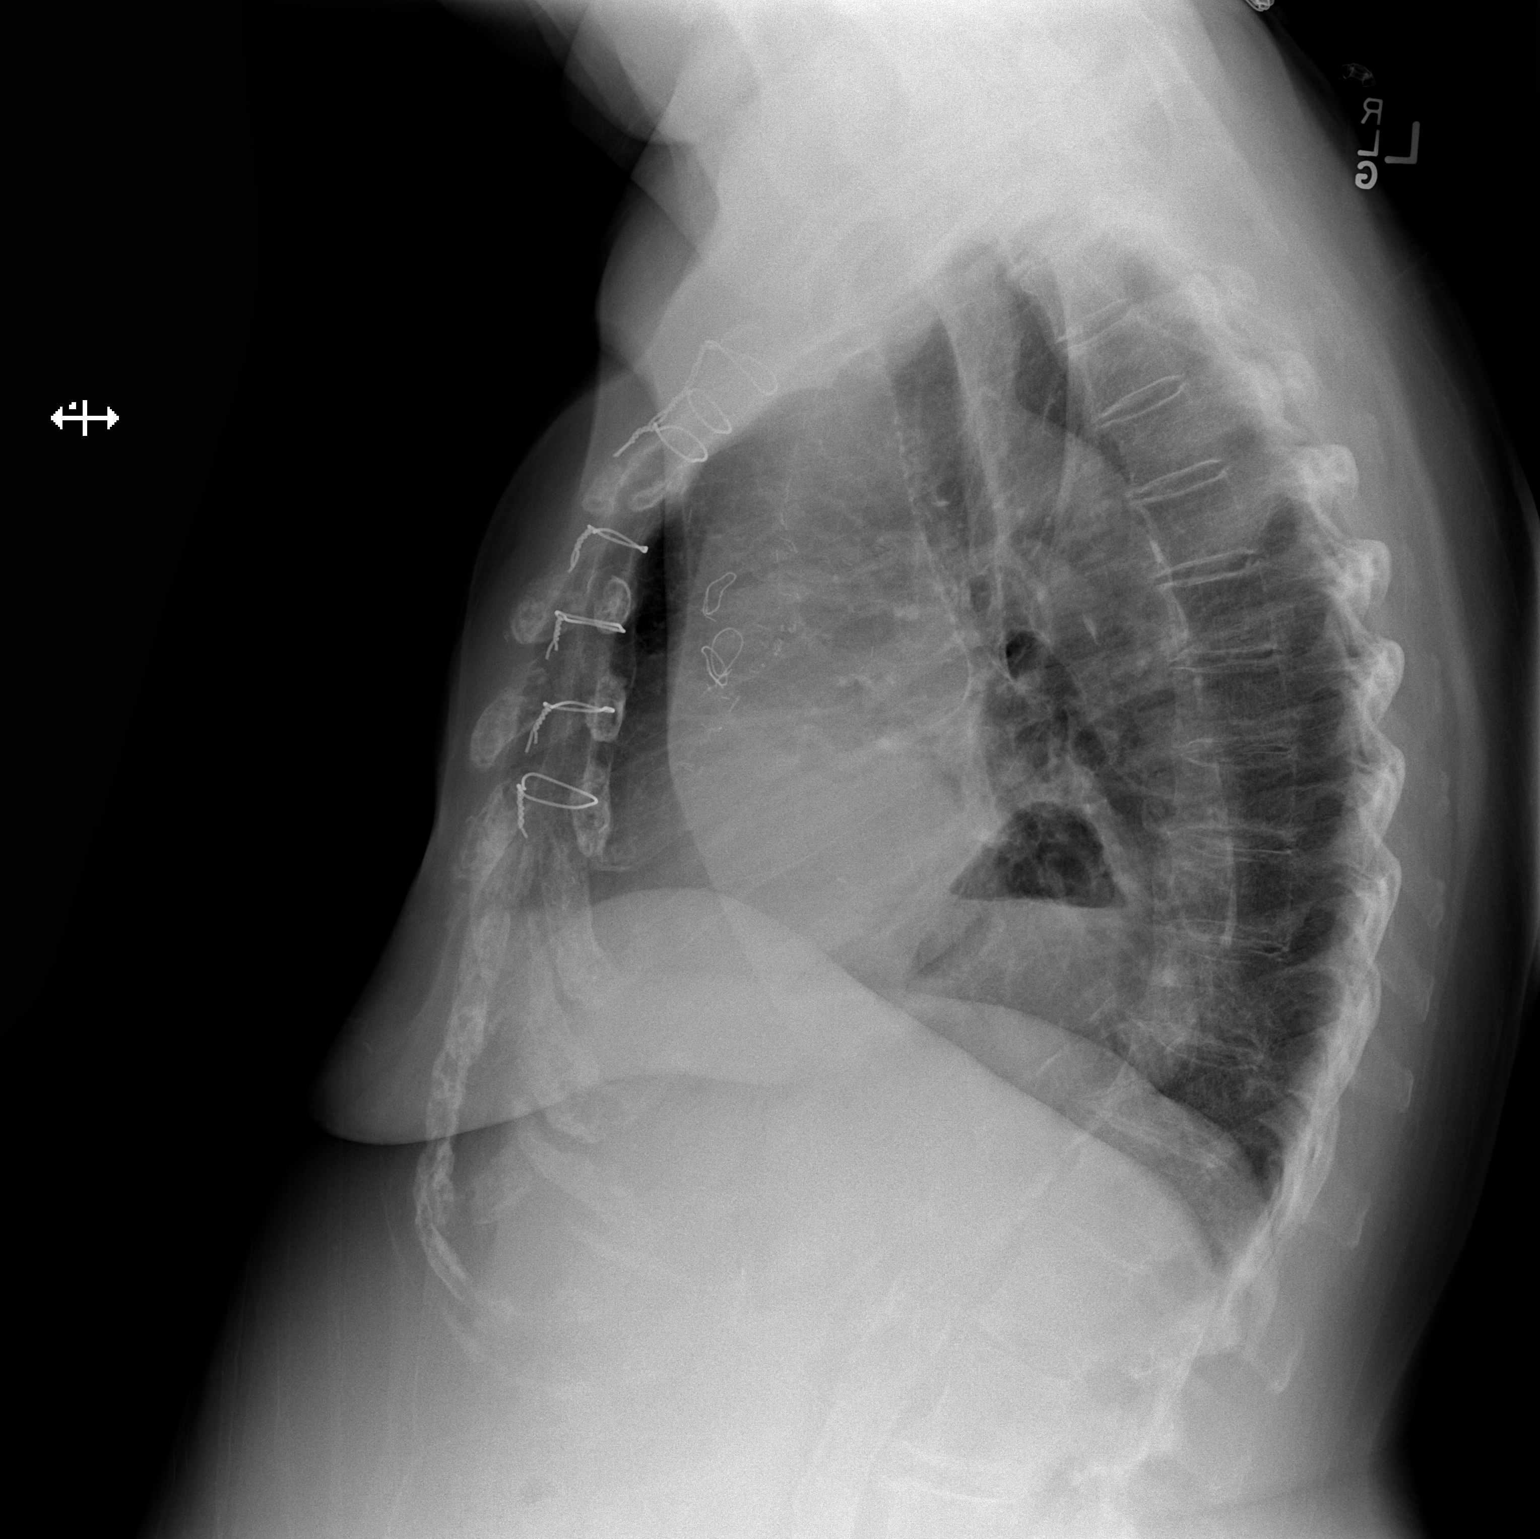

[2 of 2 positions shown; findings below may reference images not displayed]

FINDINGS: Sternotomy wires overlie stable enlarged cardiac silhouette. No
effusion, infiltrate, pneumothorax. Large hiatal hernia noted.
Calcification aorta noted.
IMPRESSION: 1. No acute findings.
2. Large hiatal hernia.
3. Atherosclerotic aortic calcification.

## 2016-12-06 ENCOUNTER — Ambulatory Visit: Payer: Medicare Other

## 2016-12-06 ENCOUNTER — Other Ambulatory Visit: Payer: Medicare Other

## 2016-12-10 ENCOUNTER — Other Ambulatory Visit: Payer: Medicare Other

## 2016-12-10 ENCOUNTER — Telehealth: Payer: Self-pay | Admitting: Hematology and Oncology

## 2016-12-10 ENCOUNTER — Ambulatory Visit: Payer: Medicare Other

## 2016-12-10 NOTE — Telephone Encounter (Signed)
Patient's daughter called and said that there has been a family emgerency and she needs to reschedule her mom's appoinmtment to the end of next week.  Please call daughter Orlinda Blalock [phone]

## 2016-12-14 ENCOUNTER — Telehealth: Payer: Self-pay | Admitting: Hematology and Oncology

## 2016-12-14 NOTE — Telephone Encounter (Signed)
Pt daughter called to r/s cxl lab/inj appt to 3/28 at 10 am

## 2016-12-22 ENCOUNTER — Ambulatory Visit (HOSPITAL_BASED_OUTPATIENT_CLINIC_OR_DEPARTMENT_OTHER): Payer: Medicare Other

## 2016-12-22 ENCOUNTER — Other Ambulatory Visit (HOSPITAL_BASED_OUTPATIENT_CLINIC_OR_DEPARTMENT_OTHER): Payer: Medicare Other

## 2016-12-22 VITALS — BP 166/38 | HR 56 | Temp 97.2°F

## 2016-12-22 DIAGNOSIS — D631 Anemia in chronic kidney disease: Secondary | ICD-10-CM

## 2016-12-22 DIAGNOSIS — N189 Chronic kidney disease, unspecified: Secondary | ICD-10-CM | POA: Diagnosis not present

## 2016-12-22 DIAGNOSIS — N184 Chronic kidney disease, stage 4 (severe): Principal | ICD-10-CM

## 2016-12-22 LAB — CBC WITH DIFFERENTIAL/PLATELET
BASO%: 0.8 % (ref 0.0–2.0)
Basophils Absolute: 0.1 10*3/uL (ref 0.0–0.1)
EOS%: 6.7 % (ref 0.0–7.0)
Eosinophils Absolute: 0.5 10*3/uL (ref 0.0–0.5)
HCT: 25.1 % — ABNORMAL LOW (ref 34.8–46.6)
HEMOGLOBIN: 8.2 g/dL — AB (ref 11.6–15.9)
LYMPH%: 17.5 % (ref 14.0–49.7)
MCH: 31 pg (ref 25.1–34.0)
MCHC: 32.6 g/dL (ref 31.5–36.0)
MCV: 95.3 fL (ref 79.5–101.0)
MONO#: 0.4 10*3/uL (ref 0.1–0.9)
MONO%: 5.8 % (ref 0.0–14.0)
NEUT%: 69.2 % (ref 38.4–76.8)
NEUTROS ABS: 4.8 10*3/uL (ref 1.5–6.5)
Platelets: 147 10*3/uL (ref 145–400)
RBC: 2.63 10*6/uL — AB (ref 3.70–5.45)
RDW: 16.8 % — AB (ref 11.2–14.5)
WBC: 6.9 10*3/uL (ref 3.9–10.3)
lymph#: 1.2 10*3/uL (ref 0.9–3.3)

## 2016-12-22 MED ORDER — DARBEPOETIN ALFA 200 MCG/0.4ML IJ SOSY
200.0000 ug | PREFILLED_SYRINGE | Freq: Once | INTRAMUSCULAR | Status: AC
  Administered 2016-12-22: 200 ug via SUBCUTANEOUS
  Filled 2016-12-22: qty 0.4

## 2016-12-22 NOTE — Progress Notes (Signed)
Pt here for Aranesp injection.  BP readings   166/38   P  54   Left Arm ;      166/39     P  54     Right Arm.   Stated she takes  Metoprolol 25 mg BID ;  Had taken am dose this morning.   Dr. Alvy Bimler notified.   MD authorized injection this time only.  Pt to follow up with her PCP or provider who prescribed BP meds for pt.   Per Dr. Alvy Bimler,  If BP continues to remain elevated, pt will not be able to receive Aranesp next time. Explanations given to pt.  Written and verbal instructions given to pt.   Pt voiced understanding, and stated she would contact her PCP for blood pressure evaluation.

## 2016-12-22 NOTE — Patient Instructions (Signed)

## 2017-01-03 ENCOUNTER — Other Ambulatory Visit: Payer: Self-pay | Admitting: Family Medicine

## 2017-01-28 ENCOUNTER — Telehealth: Payer: Self-pay

## 2017-01-28 DIAGNOSIS — R3129 Other microscopic hematuria: Secondary | ICD-10-CM | POA: Diagnosis not present

## 2017-01-28 DIAGNOSIS — N390 Urinary tract infection, site not specified: Secondary | ICD-10-CM | POA: Diagnosis not present

## 2017-01-28 NOTE — Telephone Encounter (Signed)
Heather at [facility] request pts allergies and last wt. Allergies given per allergies listed in pts chart and last recorded wt 176.5 on 08/26/16. Nothing further needed.

## 2017-01-29 ENCOUNTER — Other Ambulatory Visit: Payer: Self-pay | Admitting: Family Medicine

## 2017-01-29 NOTE — Telephone Encounter (Signed)
Last office 08/26/16.  Last refilled 09/09/16 for #4 with 2 refills.  Last Vit D level 04/28/16 which was normal at 34.1 ng/ml.  Ok to refill?

## 2017-01-30 ENCOUNTER — Inpatient Hospital Stay (HOSPITAL_BASED_OUTPATIENT_CLINIC_OR_DEPARTMENT_OTHER)
Admission: EM | Admit: 2017-01-30 | Discharge: 2017-02-03 | DRG: 689 | Disposition: A | Discharge status: Skilled Nursing Facility | Payer: Medicare Other | Attending: Internal Medicine | Admitting: Internal Medicine

## 2017-01-30 ENCOUNTER — Encounter (HOSPITAL_BASED_OUTPATIENT_CLINIC_OR_DEPARTMENT_OTHER): Payer: Self-pay | Admitting: *Deleted

## 2017-01-30 ENCOUNTER — Emergency Department (HOSPITAL_BASED_OUTPATIENT_CLINIC_OR_DEPARTMENT_OTHER): Payer: Medicare Other

## 2017-01-30 DIAGNOSIS — E039 Hypothyroidism, unspecified: Secondary | ICD-10-CM | POA: Diagnosis not present

## 2017-01-30 DIAGNOSIS — R110 Nausea: Secondary | ICD-10-CM

## 2017-01-30 DIAGNOSIS — Z885 Allergy status to narcotic agent status: Secondary | ICD-10-CM

## 2017-01-30 DIAGNOSIS — Z8249 Family history of ischemic heart disease and other diseases of the circulatory system: Secondary | ICD-10-CM

## 2017-01-30 DIAGNOSIS — I130 Hypertensive heart and chronic kidney disease with heart failure and stage 1 through stage 4 chronic kidney disease, or unspecified chronic kidney disease: Secondary | ICD-10-CM | POA: Diagnosis present

## 2017-01-30 DIAGNOSIS — K562 Volvulus: Secondary | ICD-10-CM | POA: Diagnosis not present

## 2017-01-30 DIAGNOSIS — R627 Adult failure to thrive: Secondary | ICD-10-CM | POA: Diagnosis not present

## 2017-01-30 DIAGNOSIS — I509 Heart failure, unspecified: Secondary | ICD-10-CM | POA: Diagnosis not present

## 2017-01-30 DIAGNOSIS — K449 Diaphragmatic hernia without obstruction or gangrene: Secondary | ICD-10-CM | POA: Diagnosis not present

## 2017-01-30 DIAGNOSIS — I2510 Atherosclerotic heart disease of native coronary artery without angina pectoris: Secondary | ICD-10-CM | POA: Diagnosis not present

## 2017-01-30 DIAGNOSIS — D539 Nutritional anemia, unspecified: Secondary | ICD-10-CM | POA: Diagnosis not present

## 2017-01-30 DIAGNOSIS — R300 Dysuria: Secondary | ICD-10-CM | POA: Diagnosis not present

## 2017-01-30 DIAGNOSIS — Z7984 Long term (current) use of oral hypoglycemic drugs: Secondary | ICD-10-CM

## 2017-01-30 DIAGNOSIS — N184 Chronic kidney disease, stage 4 (severe): Secondary | ICD-10-CM | POA: Diagnosis not present

## 2017-01-30 DIAGNOSIS — D696 Thrombocytopenia, unspecified: Secondary | ICD-10-CM | POA: Diagnosis not present

## 2017-01-30 DIAGNOSIS — D631 Anemia in chronic kidney disease: Secondary | ICD-10-CM | POA: Diagnosis present

## 2017-01-30 DIAGNOSIS — N390 Urinary tract infection, site not specified: Principal | ICD-10-CM

## 2017-01-30 DIAGNOSIS — Z7982 Long term (current) use of aspirin: Secondary | ICD-10-CM

## 2017-01-30 DIAGNOSIS — Z9221 Personal history of antineoplastic chemotherapy: Secondary | ICD-10-CM

## 2017-01-30 DIAGNOSIS — E1143 Type 2 diabetes mellitus with diabetic autonomic (poly)neuropathy: Secondary | ICD-10-CM | POA: Diagnosis not present

## 2017-01-30 DIAGNOSIS — J90 Pleural effusion, not elsewhere classified: Secondary | ICD-10-CM

## 2017-01-30 DIAGNOSIS — E1140 Type 2 diabetes mellitus with diabetic neuropathy, unspecified: Secondary | ICD-10-CM | POA: Diagnosis not present

## 2017-01-30 DIAGNOSIS — E785 Hyperlipidemia, unspecified: Secondary | ICD-10-CM | POA: Diagnosis not present

## 2017-01-30 DIAGNOSIS — E0840 Diabetes mellitus due to underlying condition with diabetic neuropathy, unspecified: Secondary | ICD-10-CM | POA: Diagnosis not present

## 2017-01-30 DIAGNOSIS — Z90710 Acquired absence of both cervix and uterus: Secondary | ICD-10-CM

## 2017-01-30 DIAGNOSIS — R630 Anorexia: Secondary | ICD-10-CM | POA: Diagnosis not present

## 2017-01-30 DIAGNOSIS — I5033 Acute on chronic diastolic (congestive) heart failure: Secondary | ICD-10-CM | POA: Diagnosis not present

## 2017-01-30 DIAGNOSIS — E876 Hypokalemia: Secondary | ICD-10-CM | POA: Diagnosis not present

## 2017-01-30 DIAGNOSIS — Z951 Presence of aortocoronary bypass graft: Secondary | ICD-10-CM

## 2017-01-30 DIAGNOSIS — Z853 Personal history of malignant neoplasm of breast: Secondary | ICD-10-CM

## 2017-01-30 DIAGNOSIS — Z91048 Other nonmedicinal substance allergy status: Secondary | ICD-10-CM

## 2017-01-30 DIAGNOSIS — I10 Essential (primary) hypertension: Secondary | ICD-10-CM

## 2017-01-30 DIAGNOSIS — I2583 Coronary atherosclerosis due to lipid rich plaque: Secondary | ICD-10-CM | POA: Diagnosis not present

## 2017-01-30 DIAGNOSIS — F329 Major depressive disorder, single episode, unspecified: Secondary | ICD-10-CM | POA: Diagnosis present

## 2017-01-30 DIAGNOSIS — N183 Chronic kidney disease, stage 3 unspecified: Secondary | ICD-10-CM | POA: Diagnosis present

## 2017-01-30 DIAGNOSIS — Z9010 Acquired absence of unspecified breast and nipple: Secondary | ICD-10-CM

## 2017-01-30 DIAGNOSIS — D638 Anemia in other chronic diseases classified elsewhere: Secondary | ICD-10-CM | POA: Diagnosis not present

## 2017-01-30 DIAGNOSIS — R05 Cough: Secondary | ICD-10-CM | POA: Diagnosis not present

## 2017-01-30 DIAGNOSIS — E0843 Diabetes mellitus due to underlying condition with diabetic autonomic (poly)neuropathy: Secondary | ICD-10-CM

## 2017-01-30 DIAGNOSIS — I4510 Unspecified right bundle-branch block: Secondary | ICD-10-CM | POA: Diagnosis not present

## 2017-01-30 DIAGNOSIS — R112 Nausea with vomiting, unspecified: Secondary | ICD-10-CM

## 2017-01-30 DIAGNOSIS — K219 Gastro-esophageal reflux disease without esophagitis: Secondary | ICD-10-CM | POA: Diagnosis not present

## 2017-01-30 DIAGNOSIS — Z8542 Personal history of malignant neoplasm of other parts of uterus: Secondary | ICD-10-CM

## 2017-01-30 DIAGNOSIS — Z79899 Other long term (current) drug therapy: Secondary | ICD-10-CM

## 2017-01-30 DIAGNOSIS — Z881 Allergy status to other antibiotic agents status: Secondary | ICD-10-CM | POA: Diagnosis not present

## 2017-01-30 DIAGNOSIS — R7989 Other specified abnormal findings of blood chemistry: Secondary | ICD-10-CM | POA: Diagnosis not present

## 2017-01-30 DIAGNOSIS — K3189 Other diseases of stomach and duodenum: Secondary | ICD-10-CM | POA: Diagnosis not present

## 2017-01-30 DIAGNOSIS — Z833 Family history of diabetes mellitus: Secondary | ICD-10-CM

## 2017-01-30 DIAGNOSIS — E1149 Type 2 diabetes mellitus with other diabetic neurological complication: Secondary | ICD-10-CM | POA: Diagnosis present

## 2017-01-30 DIAGNOSIS — T370X5A Adverse effect of sulfonamides, initial encounter: Secondary | ICD-10-CM | POA: Diagnosis present

## 2017-01-30 DIAGNOSIS — E119 Type 2 diabetes mellitus without complications: Secondary | ICD-10-CM | POA: Diagnosis not present

## 2017-01-30 LAB — COMPREHENSIVE METABOLIC PANEL
ALBUMIN: 3.4 g/dL — AB (ref 3.5–5.0)
ALT: 12 U/L — ABNORMAL LOW (ref 14–54)
ANION GAP: 12 (ref 5–15)
AST: 21 U/L (ref 15–41)
Alkaline Phosphatase: 88 U/L (ref 38–126)
BUN: 14 mg/dL (ref 6–20)
CALCIUM: 9 mg/dL (ref 8.9–10.3)
CO2: 25 mmol/L (ref 22–32)
Chloride: 102 mmol/L (ref 101–111)
Creatinine, Ser: 1.3 mg/dL — ABNORMAL HIGH (ref 0.44–1.00)
GFR calc Af Amer: 43 mL/min — ABNORMAL LOW (ref >60)
GFR, EST NON AFRICAN AMERICAN: 37 mL/min — AB (ref >60)
Glucose, Bld: 180 mg/dL — ABNORMAL HIGH (ref 65–99)
POTASSIUM: 3.2 mmol/L — AB (ref 3.5–5.1)
Sodium: 139 mmol/L (ref 135–145)
TOTAL PROTEIN: 6.6 g/dL (ref 6.5–8.1)
Total Bilirubin: 0.6 mg/dL (ref 0.3–1.2)

## 2017-01-30 LAB — CBC WITH DIFFERENTIAL/PLATELET
Basophils Absolute: 0 10*3/uL (ref 0.0–0.1)
Basophils Relative: 0 %
Eosinophils Absolute: 0 10*3/uL (ref 0.0–0.7)
Eosinophils Relative: 0 %
HEMATOCRIT: 28.7 % — AB (ref 36.0–46.0)
HEMOGLOBIN: 9.3 g/dL — AB (ref 12.0–15.0)
LYMPHS ABS: 0.8 10*3/uL (ref 0.7–4.0)
Lymphocytes Relative: 9 %
MCH: 30.4 pg (ref 26.0–34.0)
MCHC: 32.4 g/dL (ref 30.0–36.0)
MCV: 93.8 fL (ref 78.0–100.0)
MONO ABS: 0.4 10*3/uL (ref 0.1–1.0)
Monocytes Relative: 5 %
NEUTROS ABS: 7.3 10*3/uL (ref 1.7–7.7)
NEUTROS PCT: 86 %
Platelets: 169 10*3/uL (ref 150–400)
RBC: 3.06 MIL/uL — ABNORMAL LOW (ref 3.87–5.11)
RDW: 14.2 % (ref 11.5–15.5)
WBC: 8.5 10*3/uL (ref 4.0–10.5)

## 2017-01-30 LAB — I-STAT CG4 LACTIC ACID, ED: LACTIC ACID, VENOUS: 1.31 mmol/L (ref 0.5–1.9)

## 2017-01-30 LAB — LIPASE, BLOOD: Lipase: 19 U/L (ref 11–51)

## 2017-01-30 MED ORDER — ONDANSETRON HCL 4 MG/2ML IJ SOLN
4.0000 mg | Freq: Once | INTRAMUSCULAR | Status: AC
  Administered 2017-01-30: 4 mg via INTRAVENOUS
  Filled 2017-01-30: qty 2

## 2017-01-30 MED ORDER — METOCLOPRAMIDE HCL 5 MG/ML IJ SOLN
10.0000 mg | INTRAMUSCULAR | Status: AC
  Administered 2017-01-30: 10 mg via INTRAVENOUS
  Filled 2017-01-30: qty 2

## 2017-01-30 MED ORDER — PROMETHAZINE HCL 25 MG/ML IJ SOLN: 12.5000 mg | Freq: Once | INTRAMUSCULAR | Status: DC

## 2017-01-30 MED ORDER — ONDANSETRON 4 MG PO TBDP
4.0000 mg | ORAL_TABLET | Freq: Once | ORAL | Status: AC
  Administered 2017-01-30: 4 mg via ORAL
  Filled 2017-01-30: qty 1

## 2017-01-30 MED ORDER — METOPROLOL TARTRATE 25 MG PO TABS
25.0000 mg | ORAL_TABLET | Freq: Once | ORAL | Status: DC
  Filled 2017-01-30 (×2): qty 1

## 2017-01-30 MED ORDER — SODIUM CHLORIDE 0.9 % IV BOLUS (SEPSIS)
1000.0000 mL | Freq: Once | INTRAVENOUS | Status: AC
  Administered 2017-01-30: 1000 mL via INTRAVENOUS

## 2017-01-30 NOTE — ED Notes (Signed)
Pt states she is still nauseated but is no longer vomiting.

## 2017-01-30 NOTE — ED Notes (Signed)
EMT's attempted to obtain urine sample again. Pt stated "I think I already used my brief." Pt did use brief and was unable to urinate in bedpan. RN notified.

## 2017-01-30 NOTE — ED Triage Notes (Signed)
Pt reports dx of UTI this past Friday and was placed on Bactrim. Reports n/v since taking antibiotic. Denies fever, diarrhea, rash.

## 2017-01-30 NOTE — ED Provider Notes (Signed)
[facility] Provider Note   CSN: [id number] Arrival date & time: 01/30/17  1610 By signing my name below, I, Dyke Brackett, attest that this documentation has been prepared under the direction and in the presence of non-physician practitioner, Carmon Sails, PA-C. Electronically Signed: Dyke Brackett, Scribe. 01/30/2017. 7:19 PM.   History   Chief Complaint Chief Complaint  Patient presents with  . Emesis   HPI [name] is a 83 y.o. female with a history of CAD s/p CABG, RBBB, first degree AV block, DM with neuropathy, CKD, anemia, HTN/HLD, and uterine cancer who presents to the Emergency Department complaining of persistent, moderate to severe nausea onset two days ago. She notes associated NBNB vomiting, abdominal pain right vomiting, and frequent urination. Per pt, she has been unable to keep food or fluids down for 3 days. Daughter states that pt has had approximately 30 oz of water in the past three days. No alleviating factors noted. Pt was seen at Urgent Care two days ago for increased urinary frequency and where she was dx with a UTI and started on Bactrim. Per pt, the nausea and vomiting starts about 30 minutes after taking her abx and lasts for the entire day. Pt's daughter states she typically experiences nausea and vomiting with UTIs. No personal history of ulcers or recent hx of GERD. No h/o heart failure. No h/o gastroparesis.  She reports SHx of hysterectomy, but denies any other abdominal SHx. Pt denies any CP, SOB, palpitations, orthopnea, suprapubic pain, constipation, diarrhea.   The history is provided by the patient and a relative. No language interpreter was used.   Past Medical History:  Diagnosis Date  . Cellulitis    R ARM  . Coronary artery disease 2008   MI, s/p 5v CABG  . Diabetes mellitus ([place])    10 years ago ~age 36  . History of breast cancer 1979  . Hyperlipidemia   . Hypertension   . Hypothyroidism   . Uterine cancer [facility]) 1991     Patient Active Problem List   Diagnosis Date Noted  . Physical debility 06/17/2016  . Deficiency anemia 06/02/2016  . Dysuria 06/02/2016  . Fluid overload 06/02/2016  . Lung crackles 06/02/2016  . Chronic kidney disease (CKD), stage III (moderate) 05/15/2016  . History of hip fracture 05/14/2016  . Right bundle branch block 11/19/2015  . History of breast cancer 10/02/2015  . History of uterine cancer 10/02/2015  . Anemia due to chronic renal failure treated with erythropoietin 10/02/2015  . Carotid stenosis 09/26/2015  . Acute kidney injury superimposed on chronic kidney disease ([place])   . Hyperlipidemia LDL goal <70   . Depression, major, single episode, mild ([place]) 10/22/2014  . Diabetic peripheral autonomic neuropathy ([place]) 12/04/2013  . Vitamin D deficiency 08/03/2012  . Diabetes mellitus with neurological manifestation ([place]) 01/26/2011  . Hypothyroidism 01/26/2011  . Essential hypertension 01/26/2011  . CAD (coronary artery disease) 01/26/2011    Past Surgical History:  Procedure Laterality Date  . APPENDECTOMY    . CARDIAC CATHETERIZATION    . CORONARY ARTERY BYPASS GRAFT  2008  . HIP FRACTURE SURGERY Right 2017  . MASTECTOMY  1979   right, followed by 2 years chemo  . NUCLEAR STRESS TEST  07/2009   EF 69%,  MILD ISCHEMIA IN INFEROLATERAL WALL  . TOTAL ABDOMINAL HYSTERECTOMY  1993  . TOTAL HIP ARTHROPLASTY  08/2009   right  . US ECHOCARDIOGRAPHY  05/2009   EF 55-60%,MILD-MOD LVH,, MILD-MOD A STENOSIS,  OB History    No data available     Home Medications    Prior to Admission medications   Medication Sig Start Date End Date Taking? Authorizing Provider  aspirin 81 MG tablet Take 81 mg by mouth daily.     Yes [provider]  atorvastatin (LIPITOR) 80 MG tablet Take 1 tablet (80 mg total) by mouth daily. 10/11/16  Yes Bedsole, Amy E, MD  Blood Glucose Monitoring Suppl (ACCU-CHEK AVIVA) device Use to check blood sugar once daily.  Dx: E11.43  10/21/15  Yes Bedsole, Amy E, MD  gabapentin (NEURONTIN) 100 MG capsule Take 2 capsules (200 mg total) by mouth at bedtime. Can increase to 300 mg at bedtime if still having neuropathy. 10/12/16  Yes Bedsole, Amy E, MD  glipiZIDE (GLUCOTROL XL) 5 MG 24 hr tablet TAKE 1 TABLET (5 MG TOTAL) BY MOUTH DAILY WITH BREAKFAST. 11/16/16  Yes Bedsole, Amy E, MD  glucose blood (ACCU-CHEK AVIVA) test strip Use to check blood sugar once daily.  Dx: E11.43 10/21/15  Yes Excell Seltzer, MD  Lancet Devices [facility]) lancets Use to check blood sugar once daily.  Dx: E11.43 10/21/15  Yes Bedsole, Amy E, MD  levothyroxine (SYNTHROID, LEVOTHROID) 50 MCG tablet TAKE 1 TABLET BY MOUTH EVERY DAY 11/06/16  Yes Bedsole, Amy E, MD  metoprolol succinate (TOPROL-XL) 25 MG 24 hr tablet Take 25 mg by mouth 2 (two) times daily.   Yes [provider]  metoprolol succinate (TOPROL-XL) 25 MG 24 hr tablet TAKE 1 TABLET BY MOUTH TWICE A DAY 11/22/16  Yes Croitoru, Mihai, MD  nitroGLYCERIN (NITROSTAT) 0.4 MG SL tablet Place 1 tablet (0.4 mg total) under the tongue every 5 (five) minutes as needed for chest pain (x 3 tabs daily). 08/28/15  Yes Simmons, Brittainy M, PA-C  pioglitazone (ACTOS) 30 MG tablet TAKE 0.5 TABLETS (15 MG TOTAL) BY MOUTH DAILY. 01/04/17  Yes Bedsole, Amy E, MD  sertraline (ZOLOFT) 25 MG tablet TAKE 1 TABLET (25 MG TOTAL) BY MOUTH DAILY. 05/24/16  Yes Bedsole, Amy E, MD  triamterene-hydrochlorothiazide (MAXZIDE-25) 37.5-25 MG tablet Take 1 tablet by mouth daily.   Yes [provider]  Vitamin D, Ergocalciferol, (DRISDOL) 50000 units CAPS capsule TAKE 1 CAPSULE BY MOUTH EVERY 7 DAYS. 09/09/16  Yes Bedsole, Amy E, MD  hydrochlorothiazide (MICROZIDE) 12.5 MG capsule Take 1 capsule (12.5 mg total) by mouth daily. 06/02/16   Artis Delay, MD  HYDROcodone-acetaminophen (NORCO/VICODIN) 5-325 MG tablet Take 1 tablet by mouth every 4 (four) hours as needed for moderate pain. 05/17/16   Ranee Gosselin, MD   triamterene-hydrochlorothiazide (DYAZIDE) 37.5-25 MG capsule TAKE 1 EACH (1 CAPSULE TOTAL) BY MOUTH DAILY. 09/28/16   Croitoru, Rachelle Hora, MD    Family History Family History  Problem Relation Age of Onset  . Cancer Mother     ? stomach cancer  . Diabetes Mother   . Heart disease Father 25  . Diabetes Brother   . Throat cancer Brother     Social History Social History  Substance Use Topics  . Smoking status: Never Smoker  . Smokeless tobacco: Never Used  . Alcohol use No     Allergies   Levofloxacin; Morphine and related; and Tape  Review of Systems Review of Systems  Constitutional: Negative for chills and fever.  HENT: Negative for congestion and sore throat.   Respiratory: Negative for cough, chest tightness and shortness of breath.   Cardiovascular: Negative for chest pain, palpitations and leg swelling.  Gastrointestinal:  Positive for abdominal pain, nausea and vomiting. Negative for constipation and diarrhea.  Genitourinary: Positive for frequency. Negative for difficulty urinating and hematuria.  Musculoskeletal: Negative for back pain.  Skin: Negative for rash.  Neurological: Negative for dizziness, seizures, syncope, weakness, light-headedness, numbness and headaches.  Hematological: Does not bruise/bleed easily.  Psychiatric/Behavioral: Negative.    Physical Exam Updated Vital Signs BP (!) 180/71 (BP Location: Left Arm)   Pulse 80   Temp 98.5 F (36.9 C) (Oral)   Resp 15   Ht '5\' 3"'$  (1.6 m)   Wt 90.7 kg   SpO2 95%   BMI 35.43 kg/m   Physical Exam  Constitutional: She is oriented to person, place, and time. She appears well-developed and well-nourished.  Non toxic appearing. In NAD.  HENT:  Head: Normocephalic and atraumatic.  Nose: Nose normal.  Mouth/Throat: Oropharynx is clear and moist. No oropharyngeal exudate.  Dry lips, but moist mucus membranes.   Eyes: Conjunctivae and EOM are normal. Pupils are equal, round, and reactive to light.  Neck:  Normal range of motion. Neck supple.  Cardiovascular: Normal rate, regular rhythm, normal heart sounds and intact distal pulses.   No murmur heard. Persistently elevated SBP 180-200. HR normal. No JVD with head of bed at 30 degrees. Good S1 and S2. No extra sounds or murmurs. 2+ and symmetric radial, dorsalis pedis and posterior tibial pulses bilaterally.   Capillary refill brisk in upper extremities.  No varicosities seen. No lower extremity edema.  No orthopnea.  Pulmonary/Chest: She has decreased breath sounds.  Slightly decreased breath sounds at bilateral lower bases No crackles Effort normal  Abdominal: There is no rebound.  No surgical abdominal scars noted.  No pulsating masses.  Active bowel sounds throughout.  Abdomen is soft, non tender without distention, rigidity, guarding or rebound.  No suprapubic tenderness. No CVAT.  Negative Murphy's. Negative McBurney's. Negative Psoas sign.  Non palpable kidneys. No hepatosplenomegaly.   Musculoskeletal: Normal range of motion. She exhibits no edema or deformity.  No swelling to BLE  Lymphadenopathy:    She has no cervical adenopathy.  Neurological: She is alert and oriented to person, place, and time. No sensory deficit.  Pt is alert and oriented.   Speech and phonation normal.   Thought process coherent.   Strength 5/5 in upper and lower extremities.   Sensation to light touch intact in upper and lower extremities.  CN I not tested CN II full visual fields  CN III, IV, VI PEERL and EOM intact CN V light touch intact in all 3 divisions of trigeminal nerve CN VII facial nerve movements intact, symmetric CN VIII hearing intact to finger rub CN IX, X no uvula deviation, symmetric soft palate rise CN XI 5/5 SCM and trapezius strength  CN XII Tongue midline with symmetric L/R movement  Skin: Skin is warm and dry. Capillary refill takes less than 2 seconds. Rash noted.  Erythematous, moist rash to bilateral inguinal regions  under pannus folds  Psychiatric: She has a normal mood and affect. Her behavior is normal. Judgment and thought content normal.  Nursing note and vitals reviewed.  ED Treatments / Results  DIAGNOSTIC STUDIES:  Oxygen Saturation is 99% on RA, normal by my interpretation.    COORDINATION OF CARE:  7:15 PM Will order UA and lab work. Discussed treatment plan with pt at bedside and pt agreed to plan.   Labs (all labs ordered are listed, but only abnormal results are displayed) Labs Reviewed  CBC WITH DIFFERENTIAL/PLATELET -  Abnormal; Notable for the following:       Result Value   RBC 3.06 (*)    Hemoglobin 9.3 (*)    HCT 28.7 (*)    All other components within normal limits  COMPREHENSIVE METABOLIC PANEL - Abnormal; Notable for the following:    Potassium 3.2 (*)    Glucose, Bld 180 (*)    Creatinine, Ser 1.30 (*)    Albumin 3.4 (*)    ALT 12 (*)    GFR calc non Af Amer 37 (*)    GFR calc Af Amer 43 (*)    All other components within normal limits  URINALYSIS, ROUTINE W REFLEX MICROSCOPIC - Abnormal; Notable for the following:    APPearance CLOUDY (*)    Glucose, UA 100 (*)    Hgb urine dipstick MODERATE (*)    Ketones, ur 15 (*)    Protein, ur >300 (*)    All other components within normal limits  URINALYSIS, MICROSCOPIC (REFLEX) - Abnormal; Notable for the following:    Bacteria, UA MANY (*)    Squamous Epithelial / LPF 6-30 (*)    All other components within normal limits  URINE CULTURE  LIPASE, BLOOD  BRAIN NATRIURETIC PEPTIDE  TROPONIN I  I-STAT CG4 LACTIC ACID, ED    EKG  EKG Interpretation  Date/Time:  Sunday [name] 30 2017 20:08:07 EDT Ventricular Rate:  79 PR Interval:    QRS Duration: 139 QT Interval:  467 QTC Calculation: 536 R Axis:   98 Text Interpretation:  Sinus rhythm Atrial premature complex Prolonged PR interval Right bundle branch block Nonspecific T abnormalities, lateral leads No significant change since last tracing Confirmed by LITTLE MD,  RACHEL [phone]) on 01/30/2017 9:20:51 PM       Radiology Dg Chest 2 View  Result Date: 01/30/2017 CLINICAL DATA:  Cough and recurrent vomiting. Patient is taking antibiotics for urinary tract infection. EXAM: CHEST  2 VIEW COMPARISON:  05/15/2016 FINDINGS: Small bilateral pleural effusions, greater on the left. Mild cardiac enlargement. No vascular congestion. No pneumothorax. Postoperative changes in the mediastinum. Degenerative changes in the spine and shoulders. Calcified and tortuous aorta. IMPRESSION: Bilateral pleural effusions, greater on the left. Mild cardiac enlargement. No focal consolidation in the lungs. Electronically Signed   By: Lucienne Capers M.D.   On: 01/30/2017 23:34   Dg Abd 1 View  Result Date: 01/30/2017 CLINICAL DATA:  Urinary tract infection this past Friday, placed on Bactrim. Nausea and vomiting since antibiotics. EXAM: ABDOMEN - 1 VIEW COMPARISON:  CT pelvis 05/14/2016.  Abdomen 09/18/2015 FINDINGS: Scattered gas and stool throughout the colon. No small or large bowel distention. No radiopaque stones. Surgical clips in the mid abdomen and pelvis. Vascular calcifications. Postoperative changes in the right hip. Thoracolumbar scoliosis convex towards the left with degenerative changes in the spine and left hip. IMPRESSION: Nonobstructive bowel gas pattern. Electronically Signed   By: Lucienne Capers M.D.   On: 01/30/2017 23:33    Procedures Procedures (including critical care time)  Medications Ordered in ED Medications  metoprolol (LOPRESSOR) tablet 25 mg (not administered)  haloperidol lactate (HALDOL) injection 1 mg (not administered)  hydrALAZINE (APRESOLINE) injection 5 mg (not administered)  ondansetron (ZOFRAN-ODT) disintegrating tablet 4 mg (4 mg Oral Given 01/30/17 1745)  sodium chloride 0.9 % bolus 1,000 mL (1,000 mLs Intravenous New Bag/Given 01/30/17 2026)  ondansetron (ZOFRAN) injection 4 mg (4 mg Intravenous Given 01/30/17 2027)  sodium chloride 0.9 % bolus  1,000 mL (1,000 mLs Intravenous New Bag/Given 01/30/17  2226)  metoCLOPramide (REGLAN) injection 10 mg (10 mg Intravenous Given 01/30/17 2225)     Initial Impression / Assessment and Plan / ED Course  I have reviewed the triage vital signs and the nursing notes.  Pertinent labs & imaging results that were available during my care of the patient were reviewed by me and considered in my medical decision making (see chart for details).  Clinical Course as of Feb 01 151  Mon [name] 31, 2017  0027 IMPRESSION: Bilateral pleural effusions, greater on the left. Mild cardiac enlargement. No focal consolidation in the lungs. DG Chest 2 View [CG]  0028 IMPRESSION: Nonobstructive bowel gas pattern. DG Abd 1 View [CG]  [id number] Sinus rhythm Atrial premature complex Prolonged PR interval Right bundle branch block Nonspecific T abnormalities, lateral leads EKG 12-Lead [CG]  0029 WBC: 8.5 [CG]  0029 Hemoglobin: (!) 9.3 [CG]  0029 Sodium: 139 [CG]  0029 Potassium: (!) 3.2 [CG]  0029 Chloride: 102 [CG]  0029 At baseline, h/o CKD Creatinine: (!) 1.30 [CG]  0029 EGFR (Non-African Amer.): (!) 37 [CG]  0029 Lipase: 19 [CG]  0029 Lactic Acid, Venous: 1.31 [CG]  0110 Hgb urine dipstick: (!) MODERATE [CG]  0110 Nitrite: NEGATIVE [CG]  0110 Leukocytes, UA: NEGATIVE [CG]  0110 Bacteria, UA: (!) MANY [CG]    Clinical Course User Index [CG] Kinnie Feil, PA-C   83 yo female with history of CAD s/p CABG, RBBB, first degree AV block, DM with neuropathy, CKD, anemia, HTN/HLD, and uterine cancer who presents with persistent nausea, NBNB vomiting, abdominal pain and frequent urination x 2 days.  Patient seen at urgent care 2 days ago and was diagnosed with UTI, started on bactrim which she has been taking as prescribed.  Patient reports nausea and vomiting started 1 hour after first dose of bactrim.  Patient does continue to have nausea, dry heaving and vomiting even hours after bactrim.  Patient has not been able to  tolerate or take any of her other medications due to nausea, dry heaving and vomiting.  She has had 30 oz of fluids in the last 2 days. On exam patient is non toxic appearing but is dry heaving/coughing and complaining of nausea. RRR. Lung sounds decreased at bilateral bases, no crackles. No JVD or LE edema.  No obvious signs of fluid overload.  No orthopnea. Erythematous, moist rash under pannus. Abdomen benign. No fever, tachypnea, tachycardia.  Neuro exam normal. Patient has had persistently elevated BP in ED 283-662 systolic.  SpO2 fluctuating 90-96% while at rest.   CBC without leukocytosis, hgb at 9.3 (h/o anemia, baseline). CMP remarkable for elevated creatinine and low GFR (h/o CKD).  Lactic acid normal.  U/A without nitrites or leukocytes or WBCs.  EKG unchanged from baseline. CXR showed bilateral pleural effusion, moderate on L without consolidation. KUB negative for obstruction. Last echo in 2016 showed EF 60-65%.  BNP and troponin pending. Urine culture pending.  Patient has received 2L IVF, zofran, reglan, haldol for nausea in ED.  Upon re-evaluation nausea is not well controlled.  BP remains elevated, RN unable to give oral metoprolol due to nausea, given hydralazine.  Patient continues to deny CP, SOB, palpitations, headache, blurred vision or any other neurological symptoms.  Will request admission for uncontrolled nausea, vomiting, rehydration, BP management and new onset pleural effusion with no h/o heart failure. Patient lives alone. Discussed patient with supervision attending who also evaluated the patient and is agreeable with plan.    Final Clinical Impressions(s) / ED  Diagnoses   Final diagnoses:  Nausea and vomiting in adult  Pleural effusion    New Prescriptions New Prescriptions   No medications on file   I personally performed the services described in this documentation, which was scribed in my presence. The recorded information has been reviewed and is accurate.      Kinnie Feil, PA-C 01/31/17 0150    Kinnie Feil, PA-C 01/31/17 0152    Rex Kras Wenda Overland, MD 02/02/17 [phone]

## 2017-01-30 NOTE — ED Notes (Signed)
EMT's assisted pt onto bedpan to obtain urine sample. Sample obtained but was a mixed sample, so no sample sent to the lab. RN notified.

## 2017-01-31 DIAGNOSIS — K219 Gastro-esophageal reflux disease without esophagitis: Secondary | ICD-10-CM | POA: Diagnosis not present

## 2017-01-31 DIAGNOSIS — K449 Diaphragmatic hernia without obstruction or gangrene: Secondary | ICD-10-CM | POA: Diagnosis not present

## 2017-01-31 DIAGNOSIS — N390 Urinary tract infection, site not specified: Secondary | ICD-10-CM | POA: Diagnosis present

## 2017-01-31 DIAGNOSIS — R627 Adult failure to thrive: Secondary | ICD-10-CM | POA: Diagnosis present

## 2017-01-31 DIAGNOSIS — N184 Chronic kidney disease, stage 4 (severe): Secondary | ICD-10-CM | POA: Diagnosis not present

## 2017-01-31 DIAGNOSIS — R488 Other symbolic dysfunctions: Secondary | ICD-10-CM | POA: Diagnosis not present

## 2017-01-31 DIAGNOSIS — E1149 Type 2 diabetes mellitus with other diabetic neurological complication: Secondary | ICD-10-CM | POA: Diagnosis not present

## 2017-01-31 DIAGNOSIS — E039 Hypothyroidism, unspecified: Secondary | ICD-10-CM | POA: Diagnosis not present

## 2017-01-31 DIAGNOSIS — R110 Nausea: Secondary | ICD-10-CM | POA: Diagnosis not present

## 2017-01-31 DIAGNOSIS — I4510 Unspecified right bundle-branch block: Secondary | ICD-10-CM | POA: Diagnosis not present

## 2017-01-31 DIAGNOSIS — D696 Thrombocytopenia, unspecified: Secondary | ICD-10-CM | POA: Diagnosis not present

## 2017-01-31 DIAGNOSIS — Z951 Presence of aortocoronary bypass graft: Secondary | ICD-10-CM | POA: Diagnosis not present

## 2017-01-31 DIAGNOSIS — J90 Pleural effusion, not elsewhere classified: Secondary | ICD-10-CM | POA: Diagnosis not present

## 2017-01-31 DIAGNOSIS — D631 Anemia in chronic kidney disease: Secondary | ICD-10-CM | POA: Diagnosis not present

## 2017-01-31 DIAGNOSIS — I5033 Acute on chronic diastolic (congestive) heart failure: Secondary | ICD-10-CM | POA: Diagnosis not present

## 2017-01-31 DIAGNOSIS — R1111 Vomiting without nausea: Secondary | ICD-10-CM | POA: Diagnosis not present

## 2017-01-31 DIAGNOSIS — I509 Heart failure, unspecified: Secondary | ICD-10-CM | POA: Diagnosis not present

## 2017-01-31 DIAGNOSIS — I2583 Coronary atherosclerosis due to lipid rich plaque: Secondary | ICD-10-CM | POA: Diagnosis not present

## 2017-01-31 DIAGNOSIS — E0840 Diabetes mellitus due to underlying condition with diabetic neuropathy, unspecified: Secondary | ICD-10-CM | POA: Diagnosis not present

## 2017-01-31 DIAGNOSIS — R300 Dysuria: Secondary | ICD-10-CM | POA: Diagnosis not present

## 2017-01-31 DIAGNOSIS — R112 Nausea with vomiting, unspecified: Secondary | ICD-10-CM | POA: Diagnosis not present

## 2017-01-31 DIAGNOSIS — N183 Chronic kidney disease, stage 3 (moderate): Secondary | ICD-10-CM | POA: Diagnosis not present

## 2017-01-31 DIAGNOSIS — I2510 Atherosclerotic heart disease of native coronary artery without angina pectoris: Secondary | ICD-10-CM | POA: Diagnosis not present

## 2017-01-31 DIAGNOSIS — D638 Anemia in other chronic diseases classified elsewhere: Secondary | ICD-10-CM | POA: Diagnosis present

## 2017-01-31 DIAGNOSIS — E785 Hyperlipidemia, unspecified: Secondary | ICD-10-CM | POA: Diagnosis not present

## 2017-01-31 DIAGNOSIS — E876 Hypokalemia: Secondary | ICD-10-CM | POA: Diagnosis not present

## 2017-01-31 DIAGNOSIS — Z8542 Personal history of malignant neoplasm of other parts of uterus: Secondary | ICD-10-CM | POA: Diagnosis not present

## 2017-01-31 DIAGNOSIS — K3189 Other diseases of stomach and duodenum: Secondary | ICD-10-CM | POA: Diagnosis present

## 2017-01-31 DIAGNOSIS — E1140 Type 2 diabetes mellitus with diabetic neuropathy, unspecified: Secondary | ICD-10-CM | POA: Diagnosis present

## 2017-01-31 DIAGNOSIS — Z853 Personal history of malignant neoplasm of breast: Secondary | ICD-10-CM | POA: Diagnosis not present

## 2017-01-31 DIAGNOSIS — I130 Hypertensive heart and chronic kidney disease with heart failure and stage 1 through stage 4 chronic kidney disease, or unspecified chronic kidney disease: Secondary | ICD-10-CM | POA: Diagnosis present

## 2017-01-31 DIAGNOSIS — Z881 Allergy status to other antibiotic agents status: Secondary | ICD-10-CM | POA: Diagnosis not present

## 2017-01-31 DIAGNOSIS — E1143 Type 2 diabetes mellitus with diabetic autonomic (poly)neuropathy: Secondary | ICD-10-CM | POA: Diagnosis not present

## 2017-01-31 DIAGNOSIS — I10 Essential (primary) hypertension: Secondary | ICD-10-CM | POA: Diagnosis not present

## 2017-01-31 DIAGNOSIS — R7989 Other specified abnormal findings of blood chemistry: Secondary | ICD-10-CM | POA: Diagnosis not present

## 2017-01-31 DIAGNOSIS — E119 Type 2 diabetes mellitus without complications: Secondary | ICD-10-CM | POA: Diagnosis not present

## 2017-01-31 DIAGNOSIS — K562 Volvulus: Secondary | ICD-10-CM | POA: Diagnosis not present

## 2017-01-31 DIAGNOSIS — D539 Nutritional anemia, unspecified: Secondary | ICD-10-CM | POA: Diagnosis not present

## 2017-01-31 DIAGNOSIS — R630 Anorexia: Secondary | ICD-10-CM | POA: Diagnosis not present

## 2017-01-31 DIAGNOSIS — R2681 Unsteadiness on feet: Secondary | ICD-10-CM | POA: Diagnosis not present

## 2017-01-31 DIAGNOSIS — R079 Chest pain, unspecified: Secondary | ICD-10-CM | POA: Diagnosis not present

## 2017-01-31 DIAGNOSIS — F329 Major depressive disorder, single episode, unspecified: Secondary | ICD-10-CM | POA: Diagnosis present

## 2017-01-31 DIAGNOSIS — Z90710 Acquired absence of both cervix and uterus: Secondary | ICD-10-CM | POA: Diagnosis not present

## 2017-01-31 DIAGNOSIS — E0843 Diabetes mellitus due to underlying condition with diabetic autonomic (poly)neuropathy: Secondary | ICD-10-CM | POA: Diagnosis not present

## 2017-01-31 DIAGNOSIS — M6281 Muscle weakness (generalized): Secondary | ICD-10-CM | POA: Diagnosis not present

## 2017-01-31 LAB — URINALYSIS, ROUTINE W REFLEX MICROSCOPIC
Bilirubin Urine: NEGATIVE
GLUCOSE, UA: 100 mg/dL — AB
KETONES UR: 15 mg/dL — AB
LEUKOCYTES UA: NEGATIVE
NITRITE: NEGATIVE
Specific Gravity, Urine: 1.022 (ref 1.005–1.030)
pH: 6 (ref 5.0–8.0)

## 2017-01-31 LAB — GLUCOSE, CAPILLARY
GLUCOSE-CAPILLARY: 92 mg/dL (ref 65–99)
Glucose-Capillary: 157 mg/dL — ABNORMAL HIGH (ref 65–99)
Glucose-Capillary: 119 mg/dL — ABNORMAL HIGH (ref 65–99)
Glucose-Capillary: 129 mg/dL — ABNORMAL HIGH (ref 65–99)

## 2017-01-31 LAB — TROPONIN I
TROPONIN I: 0.05 ng/mL — AB (ref <0.03)
TROPONIN I: 0.09 ng/mL — AB (ref <0.03)
Troponin I: 0.03 ng/mL (ref <0.03)

## 2017-01-31 LAB — URINALYSIS, MICROSCOPIC (REFLEX)

## 2017-01-31 LAB — CBG MONITORING, ED: Glucose-Capillary: 172 mg/dL — ABNORMAL HIGH (ref 65–99)

## 2017-01-31 LAB — BRAIN NATRIURETIC PEPTIDE: B NATRIURETIC PEPTIDE 5: 588.7 pg/mL — AB (ref 0.0–100.0)

## 2017-01-31 MED ORDER — PROMETHAZINE HCL 25 MG/ML IJ SOLN
6.2500 mg | Freq: Four times a day (QID) | INTRAMUSCULAR | Status: DC | PRN
  Administered 2017-01-31 – 2017-02-02 (×5): 6.25 mg via INTRAVENOUS
  Filled 2017-01-31 (×6): qty 1

## 2017-01-31 MED ORDER — SODIUM CHLORIDE 0.9 % IV SOLN
INTRAVENOUS | Status: AC
  Administered 2017-01-31: 18:00:00 via INTRAVENOUS

## 2017-01-31 MED ORDER — ASPIRIN EC 81 MG PO TBEC
81.0000 mg | DELAYED_RELEASE_TABLET | Freq: Every day | ORAL | Status: DC
  Administered 2017-02-01 – 2017-02-03 (×2): 81 mg via ORAL
  Filled 2017-01-31 (×3): qty 1

## 2017-01-31 MED ORDER — HYDRALAZINE HCL 20 MG/ML IJ SOLN
10.0000 mg | Freq: Four times a day (QID) | INTRAMUSCULAR | Status: DC | PRN
  Administered 2017-01-31 – 2017-02-01 (×3): 10 mg via INTRAVENOUS
  Filled 2017-01-31 (×3): qty 1

## 2017-01-31 MED ORDER — SODIUM CHLORIDE 0.9 % IV SOLN: INTRAVENOUS | Status: DC

## 2017-01-31 MED ORDER — LEVOTHYROXINE SODIUM 50 MCG PO TABS
50.0000 ug | ORAL_TABLET | Freq: Every day | ORAL | Status: DC
  Administered 2017-02-01 – 2017-02-03 (×2): 50 ug via ORAL
  Filled 2017-01-31 (×3): qty 1

## 2017-01-31 MED ORDER — ACETAMINOPHEN 325 MG PO TABS: 650.0000 mg | ORAL_TABLET | Freq: Four times a day (QID) | ORAL | Status: DC | PRN

## 2017-01-31 MED ORDER — HYDRALAZINE HCL 20 MG/ML IJ SOLN
5.0000 mg | Freq: Once | INTRAMUSCULAR | Status: AC
  Administered 2017-01-31: 5 mg via INTRAVENOUS
  Filled 2017-01-31: qty 1

## 2017-01-31 MED ORDER — SODIUM CHLORIDE 0.9% FLUSH
3.0000 mL | Freq: Two times a day (BID) | INTRAVENOUS | Status: DC
  Administered 2017-01-31 – 2017-02-03 (×5): 3 mL via INTRAVENOUS

## 2017-01-31 MED ORDER — ENOXAPARIN SODIUM 40 MG/0.4ML ~~LOC~~ SOLN
40.0000 mg | SUBCUTANEOUS | Status: DC
  Administered 2017-01-31 – 2017-02-03 (×4): 40 mg via SUBCUTANEOUS
  Filled 2017-01-31 (×4): qty 0.4

## 2017-01-31 MED ORDER — SERTRALINE HCL 25 MG PO TABS
25.0000 mg | ORAL_TABLET | Freq: Every day | ORAL | Status: DC
  Administered 2017-02-01 – 2017-02-03 (×2): 25 mg via ORAL
  Filled 2017-01-31 (×3): qty 1

## 2017-01-31 MED ORDER — ONDANSETRON HCL 4 MG/2ML IJ SOLN
4.0000 mg | Freq: Four times a day (QID) | INTRAMUSCULAR | Status: DC | PRN
  Administered 2017-01-31 – 2017-02-02 (×2): 4 mg via INTRAVENOUS
  Filled 2017-01-31 (×2): qty 2

## 2017-01-31 MED ORDER — INSULIN ASPART 100 UNIT/ML ~~LOC~~ SOLN
0.0000 [IU] | Freq: Three times a day (TID) | SUBCUTANEOUS | Status: DC
  Administered 2017-01-31: 2 [IU] via SUBCUTANEOUS
  Administered 2017-01-31 – 2017-02-03 (×2): 1 [IU] via SUBCUTANEOUS
  Administered 2017-02-03 (×2): 3 [IU] via SUBCUTANEOUS

## 2017-01-31 MED ORDER — DEXTROSE 5 % IV SOLN
1.0000 g | INTRAVENOUS | Status: DC
  Administered 2017-01-31 – 2017-02-03 (×4): 1 g via INTRAVENOUS
  Filled 2017-01-31 (×4): qty 10

## 2017-01-31 MED ORDER — GABAPENTIN 100 MG PO CAPS
200.0000 mg | ORAL_CAPSULE | Freq: Every day | ORAL | Status: DC
  Administered 2017-02-02: 200 mg via ORAL
  Filled 2017-01-31 (×3): qty 2

## 2017-01-31 MED ORDER — ACETAMINOPHEN 650 MG RE SUPP: 650.0000 mg | Freq: Four times a day (QID) | RECTAL | Status: DC | PRN

## 2017-01-31 MED ORDER — ONDANSETRON HCL 4 MG PO TABS
4.0000 mg | ORAL_TABLET | Freq: Four times a day (QID) | ORAL | Status: DC | PRN
Start: 2017-01-31 — End: 2017-02-03

## 2017-01-31 MED ORDER — HALOPERIDOL LACTATE 5 MG/ML IJ SOLN
1.0000 mg | Freq: Once | INTRAMUSCULAR | Status: AC
  Administered 2017-01-31: 1 mg via INTRAVENOUS
  Filled 2017-01-31: qty 1

## 2017-01-31 MED ORDER — TRIAMTERENE-HCTZ 37.5-25 MG PO TABS
1.0000 | ORAL_TABLET | Freq: Every day | ORAL | Status: DC
  Administered 2017-02-01: 1 via ORAL
  Filled 2017-01-31 (×2): qty 1

## 2017-01-31 MED ORDER — METOPROLOL SUCCINATE ER 25 MG PO TB24
25.0000 mg | ORAL_TABLET | Freq: Two times a day (BID) | ORAL | Status: DC
Start: 2017-01-31 — End: 2017-02-03
  Administered 2017-02-01 – 2017-02-03 (×3): 25 mg via ORAL
  Filled 2017-01-31 (×6): qty 1

## 2017-01-31 MED ORDER — ATORVASTATIN CALCIUM 40 MG PO TABS
80.0000 mg | ORAL_TABLET | Freq: Every day | ORAL | Status: DC
  Administered 2017-02-01 – 2017-02-03 (×2): 80 mg via ORAL
  Filled 2017-01-31 (×3): qty 2

## 2017-01-31 NOTE — Care Management Note (Signed)
Case Management Note  Patient Details  Name: [name] MRN: [id number] Date of Birth: 1933-04-02  Subjective/Objective: 83 y/o f admitted w/n/v. From home.                   Action/Plan:d/c plan home.   Expected Discharge Date:                  Expected Discharge Plan:  Home/Self Care  In-House Referral:     Discharge planning Services  CM Consult  Post Acute Care Choice:    Choice offered to:     DME Arranged:    DME Agency:     HH Arranged:    HH Agency:     Status of Service:  In process, will continue to follow  If discussed at Long Length of Stay Meetings, dates discussed:    Additional Comments:  Dessa Phi, RN 01/31/2017, 11:35 AM

## 2017-01-31 NOTE — Plan of Care (Signed)
83 yo F with N/V since being started on bactrim for UTI a couple of days ago.  Poor PO intake due to vomiting everything up.  In ED, UA doesn't really show UTI but does show proteinuria.  N/V even hours after bactrim.  Unable to control nausea with meds in ED.  BP has been running 180-200 in ED.  Has pleural effusions on CXR.  Patient going to tele.

## 2017-01-31 NOTE — H&P (Addendum)
History and Physical    [name] HKV:[id number] DOB: 03/14/1933 DOA: 01/30/2017  I have briefly reviewed the patient's prior medical records in [place]  PCP: Jinny Sanders, MD  Patient coming from: home  Chief Complaint: Intractable nausea and vomiting  HPI: [name] is a 83 y.o. female with medical history significant of coronary artery disease status post CABG, type 2 diabetes mellitus, hypertension, hypothyroidism, hyperlipidemia, presents to the emergency room with a chief complaint of intractable nausea and vomiting for the past 3 days.  Patient tells me that she was diagnosed with a urinary tract infection at an urgent care 3 days ago when she was given Bactrim.  After think the first dose of Bactrim, she has had significant nausea and vomiting and inability for any p.o. intake that lasted the whole day.  She usually gets nausea vomiting urinary tract infections, however this time it was much more severe.  As far as her presenting symptom for UTI was increased frequency.  She denies any abdominal pain, she denies any chest pain or shortness of breath.  She is feeling quite crummy currently because of ongoing nausea.  She does not think she will be able to eat anything.  She denies any fever or chills.  She has no lightheadedness or dizziness.  Over the last 3 days, she was unable to take any of her p.o. medications at home.  At baseline she is highly independent, she is able to perform all her ADLs and is still driving.  ED Course: In the emergency room she is afebrile, she is on the hypertensive side, her blood work reveals a creatinine of 1.3, she ha normal white counts, BNP is slightly elevated at 588, and her troponin is 0.03.  Lactic acid is 1.3.  Urinalysis showed many bacteria, increased white counts.  TRH is asked for admission for intractable nausea and vomiting in the setting of urinary tract infection.  Review of Systems: As per HPI otherwise 10 point review  of systems negative.   Past Medical History:  Diagnosis Date  . Cellulitis    R ARM  . Coronary artery disease 2008   MI, s/p 5v CABG  . Diabetes mellitus ([place])    10 years ago ~age 77  . History of breast cancer 1979  . Hyperlipidemia   . Hypertension   . Hypothyroidism   . Uterine cancer ([place]) 1991    Past Surgical History:  Procedure Laterality Date  . APPENDECTOMY    . CARDIAC CATHETERIZATION    . CORONARY ARTERY BYPASS GRAFT  2008  . HIP FRACTURE SURGERY Right 2017  . MASTECTOMY  1979   right, followed by 2 years chemo  . NUCLEAR STRESS TEST  07/2009   EF 69%,  MILD ISCHEMIA IN INFEROLATERAL WALL  . TOTAL ABDOMINAL HYSTERECTOMY  1993  . TOTAL HIP ARTHROPLASTY  08/2009   right  . US ECHOCARDIOGRAPHY  05/2009   EF 55-60%,MILD-MOD LVH,, MILD-MOD A STENOSIS,     reports that she has never smoked. She has never used smokeless tobacco. She reports that she does not drink alcohol or use drugs.  Allergies  Allergen Reactions  . Levofloxacin Nausea And Vomiting  . Morphine And Related Other (See Comments)    BAD HEADACHE  . Tape Rash    Family History  Problem Relation Age of Onset  . Cancer Mother     ? stomach cancer  . Diabetes Mother   . Heart disease Father 13  .  Diabetes Brother   . Throat cancer Brother     Prior to Admission medications   Medication Sig Start Date End Date Taking? Authorizing Provider  aspirin 81 MG tablet Take 81 mg by mouth daily.     Yes [provider]  atorvastatin (LIPITOR) 80 MG tablet Take 1 tablet (80 mg total) by mouth daily. 10/11/16  Yes Bedsole, Amy E, MD  Blood Glucose Monitoring Suppl (ACCU-CHEK AVIVA) device Use to check blood sugar once daily.  Dx: E11.43 10/21/15  Yes Bedsole, Amy E, MD  gabapentin (NEURONTIN) 100 MG capsule Take 2 capsules (200 mg total) by mouth at bedtime. Can increase to 300 mg at bedtime if still having neuropathy. 10/12/16  Yes Bedsole, Amy E, MD  glipiZIDE (GLUCOTROL XL) 5 MG 24 hr tablet  TAKE 1 TABLET (5 MG TOTAL) BY MOUTH DAILY WITH BREAKFAST. 11/16/16  Yes Bedsole, Amy E, MD  glucose blood (ACCU-CHEK AVIVA) test strip Use to check blood sugar once daily.  Dx: E11.43 10/21/15  Yes Jinny Sanders, MD  Lancet Devices [facility]) lancets Use to check blood sugar once daily.  Dx: E11.43 10/21/15  Yes Bedsole, Amy E, MD  levothyroxine (SYNTHROID, LEVOTHROID) 50 MCG tablet TAKE 1 TABLET BY MOUTH EVERY DAY 11/06/16  Yes Bedsole, Amy E, MD  metoprolol succinate (TOPROL-XL) 25 MG 24 hr tablet Take 25 mg by mouth 2 (two) times daily.   Yes [provider]  metoprolol succinate (TOPROL-XL) 25 MG 24 hr tablet TAKE 1 TABLET BY MOUTH TWICE A DAY 11/22/16  Yes Croitoru, Mihai, MD  nitroGLYCERIN (NITROSTAT) 0.4 MG SL tablet Place 1 tablet (0.4 mg total) under the tongue every 5 (five) minutes as needed for chest pain (x 3 tabs daily). 08/28/15  Yes Simmons, Brittainy M, PA-C  pioglitazone (ACTOS) 30 MG tablet TAKE 0.5 TABLETS (15 MG TOTAL) BY MOUTH DAILY. 01/04/17  Yes Bedsole, Amy E, MD  sertraline (ZOLOFT) 25 MG tablet TAKE 1 TABLET (25 MG TOTAL) BY MOUTH DAILY. 05/24/16  Yes Bedsole, Amy E, MD  triamterene-hydrochlorothiazide (MAXZIDE-25) 37.5-25 MG tablet Take 1 tablet by mouth daily.   Yes [provider]  Vitamin D, Ergocalciferol, (DRISDOL) 50000 units CAPS capsule TAKE 1 CAPSULE BY MOUTH EVERY 7 DAYS. 09/09/16  Yes Bedsole, Amy E, MD  hydrochlorothiazide (MICROZIDE) 12.5 MG capsule Take 1 capsule (12.5 mg total) by mouth daily. 06/02/16   Heath Lark, MD  HYDROcodone-acetaminophen (NORCO/VICODIN) 5-325 MG tablet Take 1 tablet by mouth every 4 (four) hours as needed for moderate pain. 05/17/16   Latanya Maudlin, MD  triamterene-hydrochlorothiazide (DYAZIDE) 37.5-25 MG capsule TAKE 1 EACH (1 CAPSULE TOTAL) BY MOUTH DAILY. 09/28/16   Croitoru, Dani Gobble, MD    Physical Exam: Vitals:   01/31/17 0500 01/31/17 0530 01/31/17 0547 01/31/17 0630  BP: (!) 168/67 (!) 179/59  (!) 183/80    Pulse: 86 89  78  Resp: 20 20  20   Temp:   98.6 F (37 C) 98.7 F (37.1 C)  TempSrc:   Oral Oral  SpO2: 90% 92%  96%  Weight:    90.2 kg (198 lb 13.7 oz)  Height:    5\' 3"  (1.6 m)   Constitutional: NAD, calm, comfortable Eyes: PERRL, lids and conjunctivae normal ENMT: Mucous membranes are moist. Posterior pharynx clear of any exudate or lesions..  Neck: normal, supple Respiratory: No wheezing, bibasilar crackles, moves air well, no accessory muscle use Cardiovascular: Regular rate and rhythm, no murmurs / rubs / gallops. Trace extremity edema. 2+ pedal  pulses.  Abdomen: no tenderness, no masses palpated. Bowel sounds positive.  Musculoskeletal: no clubbing / cyanosis. Normal muscle tone.  Skin: no rashes, lesions, ulcers. No induration Neurologic: CN 2-12 grossly intact. Strength 5/5 in all 4.  Psychiatric: Normal judgment and insight. Alert and oriented x 3. Normal mood.   Labs on Admission: I have personally reviewed following labs and imaging studies  CBC:  Recent Labs Lab 01/30/17 1900  WBC 8.5  NEUTROABS 7.3  HGB 9.3*  HCT 28.7*  MCV 93.8  PLT 194   Basic Metabolic Panel:  Recent Labs Lab 01/30/17 1900  NA 139  K 3.2*  CL 102  CO2 25  GLUCOSE 180*  BUN 14  CREATININE 1.30*  CALCIUM 9.0   GFR: Estimated Creatinine Clearance: 36.2 mL/min (A) (by C-G formula based on SCr of 1.3 mg/dL (H)). Liver Function Tests:  Recent Labs Lab 01/30/17 1900  AST 21  ALT 12*  ALKPHOS 88  BILITOT 0.6  PROT 6.6  ALBUMIN 3.4*    Recent Labs Lab 01/30/17 1900  LIPASE 19   No results for input(s): AMMONIA in the last 168 hours. Coagulation Profile: No results for input(s): INR, PROTIME in the last 168 hours. Cardiac Enzymes:  Recent Labs Lab 01/31/17 0428  TROPONINI 0.03*   BNP (last 3 results) No results for input(s): PROBNP in the last 8760 hours. HbA1C: No results for input(s): HGBA1C in the last 72 hours. CBG:  Recent Labs Lab 01/31/17 0520   GLUCAP 172*   Lipid Profile: No results for input(s): CHOL, HDL, LDLCALC, TRIG, CHOLHDL, LDLDIRECT in the last 72 hours. Thyroid Function Tests: No results for input(s): TSH, T4TOTAL, FREET4, T3FREE, THYROIDAB in the last 72 hours. Anemia Panel: No results for input(s): VITAMINB12, FOLATE, FERRITIN, TIBC, IRON, RETICCTPCT in the last 72 hours. Urine analysis:    Component Value Date/Time   COLORURINE YELLOW 01/31/2017 0034   APPEARANCEUR CLOUDY (A) 01/31/2017 0034   LABSPEC 1.022 01/31/2017 0034   LABSPEC 1.015 08/12/2016 1115   PHURINE 6.0 01/31/2017 0034   GLUCOSEU 100 (A) 01/31/2017 0034   GLUCOSEU Negative 08/12/2016 1115   HGBUR MODERATE (A) 01/31/2017 0034   BILIRUBINUR NEGATIVE 01/31/2017 0034   BILIRUBINUR Negative 08/12/2016 1115   KETONESUR 15 (A) 01/31/2017 0034   PROTEINUR >300 (A) 01/31/2017 0034   UROBILINOGEN 0.2 08/12/2016 1115   NITRITE NEGATIVE 01/31/2017 0034   LEUKOCYTESUR NEGATIVE 01/31/2017 0034   LEUKOCYTESUR Moderate 08/12/2016 1115     Radiological Exams on Admission: Dg Chest 2 View  Result Date: 01/30/2017 CLINICAL DATA:  Cough and recurrent vomiting. Patient is taking antibiotics for urinary tract infection. EXAM: CHEST  2 VIEW COMPARISON:  05/15/2016 FINDINGS: Small bilateral pleural effusions, greater on the left. Mild cardiac enlargement. No vascular congestion. No pneumothorax. Postoperative changes in the mediastinum. Degenerative changes in the spine and shoulders. Calcified and tortuous aorta. IMPRESSION: Bilateral pleural effusions, greater on the left. Mild cardiac enlargement. No focal consolidation in the lungs. Electronically Signed   By: Lucienne Capers M.D.   On: 01/30/2017 23:34   Dg Abd 1 View  Result Date: 01/30/2017 CLINICAL DATA:  Urinary tract infection this past Friday, placed on Bactrim. Nausea and vomiting since antibiotics. EXAM: ABDOMEN - 1 VIEW COMPARISON:  CT pelvis 05/14/2016.  Abdomen 09/18/2015 FINDINGS: Scattered gas and  stool throughout the colon. No small or large bowel distention. No radiopaque stones. Surgical clips in the mid abdomen and pelvis. Vascular calcifications. Postoperative changes in the right hip. Thoracolumbar scoliosis convex towards  the left with degenerative changes in the spine and left hip. IMPRESSION: Nonobstructive bowel gas pattern. Electronically Signed   By: Lucienne Capers M.D.   On: 01/30/2017 23:33    EKG: Independently reviewed.  Sinus rhythm, right bundle branch block  Assessment/Plan Active Problems:   Diabetes mellitus with neurological manifestation (HCC)   Hypothyroidism   Essential hypertension   CAD (coronary artery disease)   Hyperlipidemia LDL goal <70   Right bundle branch block   Chronic kidney disease (CKD), stage III (moderate)   Deficiency anemia   Dysuria   Intractable nausea and vomiting   UTI (urinary tract infection)   Intractable nausea and vomiting in the setting of urinary tract infection and Bactrim side effect -Patient diagnosed with a UTI about 3 days ago, however had difficulties keeping her Bactrim down. -Admit to the hospital for supportive treatment, provide IV antibiotics ceftriaxone, hold Bactrim -Await urine culture, however may be negative due to the fact that she was on Bactrim  Hypertension -She is quite hypertensive in the ED, IV hydralazine as needed, resume home medications as tolerated p.o.  Coronary artery disease status post CABG -She denies any chest pain, she has slight troponin elevation 0.03 which is likely demand ischemia.  We will continue to cycle. -Continue aspirin  Acute on chronic diastolic CHF -Most recent 2D echo was in 2016 and showed normal ejection fraction of 60-65% and grade 2 diastolic dysfunction.  She has mild evidence of fluid overload with bibasilar pleural effusions and mild cardiac enlargement, however she is on room air.  Will avoid further IV fluids, placed back on Maxzide as tolerated.  Low threshold  for Lasix if she develops shortness of breath  Diabetes mellitus -Hold home oral agents, place patient on sliding scale insulin  Chronic kidney disease stage III -Her creatinine currently is at baseline  Depression -Resume home Zoloft  Hyperlipidemia -Resume home statin  Hypothyroidism -Resume home Synthroid   DVT prophylaxis: Lovenox Code Status: Full code Family Communication: No family at bedside Disposition Plan: Admit to telemetry, expect home already Consults called: None    Admission status: Observation  At the point of initial evaluation, it is my clinical opinion that admission for OBSERVATION is reasonable and necessary because the patient's presenting complaints in the context of their chronic conditions represent sufficient risk of deterioration or significant morbidity to constitute reasonable grounds for close observation in the hospital setting, but that the patient may be medically stable for discharge from the hospital within 24 to 48 hours.   Marzetta Board, MD [facility] Pager [phone]  If 7PM-7AM, please contact night-coverage [email or web address] Password [email or web address]  01/31/2017, 8:21 AM

## 2017-01-31 NOTE — ED Notes (Signed)
Lopressor not given yet due to pt actively dry heaving.

## 2017-01-31 NOTE — Care Management Obs Status (Signed)
[facility] NOTIFICATION   Patient Details  Name: [name] MRN: [id number] Date of Birth: 09-Feb-1933   Medicare Observation Status Notification Given:  Yes    MahabirJuliann Pulse, RN 01/31/2017, 11:34 AM

## 2017-01-31 NOTE — ED Notes (Signed)
Troponin level of 0.03 called by phyllis in the lab. Dr. Dina Rich aware and not further orders received.

## 2017-02-01 ENCOUNTER — Telehealth: Payer: Self-pay

## 2017-02-01 ENCOUNTER — Inpatient Hospital Stay (HOSPITAL_COMMUNITY): Payer: Medicare Other

## 2017-02-01 ENCOUNTER — Encounter (HOSPITAL_COMMUNITY): Payer: Self-pay | Admitting: Radiology

## 2017-02-01 DIAGNOSIS — N184 Chronic kidney disease, stage 4 (severe): Secondary | ICD-10-CM

## 2017-02-01 DIAGNOSIS — Z853 Personal history of malignant neoplasm of breast: Secondary | ICD-10-CM

## 2017-02-01 DIAGNOSIS — D631 Anemia in chronic kidney disease: Secondary | ICD-10-CM

## 2017-02-01 DIAGNOSIS — R112 Nausea with vomiting, unspecified: Secondary | ICD-10-CM

## 2017-02-01 DIAGNOSIS — R627 Adult failure to thrive: Secondary | ICD-10-CM

## 2017-02-01 DIAGNOSIS — R7989 Other specified abnormal findings of blood chemistry: Secondary | ICD-10-CM

## 2017-02-01 DIAGNOSIS — J90 Pleural effusion, not elsewhere classified: Secondary | ICD-10-CM

## 2017-02-01 DIAGNOSIS — K562 Volvulus: Secondary | ICD-10-CM

## 2017-02-01 LAB — CBC
HEMATOCRIT: 41.1 % (ref 36.0–46.0)
Hemoglobin: 13.2 g/dL (ref 12.0–15.0)
MCH: 29.7 pg (ref 26.0–34.0)
MCHC: 32.1 g/dL (ref 30.0–36.0)
MCV: 92.4 fL (ref 78.0–100.0)
Platelets: 114 10*3/uL — ABNORMAL LOW (ref 150–400)
RBC: 4.45 MIL/uL (ref 3.87–5.11)
RDW: 14.9 % (ref 11.5–15.5)
WBC: 6.4 10*3/uL (ref 4.0–10.5)

## 2017-02-01 LAB — BASIC METABOLIC PANEL
Anion gap: 10 (ref 5–15)
BUN: 16 mg/dL (ref 6–20)
CHLORIDE: 108 mmol/L (ref 101–111)
CO2: 23 mmol/L (ref 22–32)
Calcium: 8.5 mg/dL — ABNORMAL LOW (ref 8.9–10.3)
Creatinine, Ser: 1.3 mg/dL — ABNORMAL HIGH (ref 0.44–1.00)
GFR calc non Af Amer: 37 mL/min — ABNORMAL LOW (ref >60)
GFR, EST AFRICAN AMERICAN: 43 mL/min — AB (ref >60)
Glucose, Bld: 104 mg/dL — ABNORMAL HIGH (ref 65–99)
POTASSIUM: 3.3 mmol/L — AB (ref 3.5–5.1)
SODIUM: 141 mmol/L (ref 135–145)

## 2017-02-01 LAB — GLUCOSE, CAPILLARY
GLUCOSE-CAPILLARY: 109 mg/dL — AB (ref 65–99)
GLUCOSE-CAPILLARY: 107 mg/dL — AB (ref 65–99)
Glucose-Capillary: 104 mg/dL — ABNORMAL HIGH (ref 65–99)
Glucose-Capillary: 96 mg/dL (ref 65–99)

## 2017-02-01 LAB — URINE CULTURE: Culture: NO GROWTH

## 2017-02-01 MED ORDER — POTASSIUM CHLORIDE CRYS ER 20 MEQ PO TBCR
40.0000 meq | EXTENDED_RELEASE_TABLET | ORAL | Status: AC
  Administered 2017-02-01: 40 meq via ORAL
  Filled 2017-02-01 (×2): qty 2

## 2017-02-01 MED ORDER — FUROSEMIDE 10 MG/ML IJ SOLN
40.0000 mg | Freq: Every day | INTRAMUSCULAR | Status: DC
  Administered 2017-02-01 – 2017-02-02 (×2): 40 mg via INTRAVENOUS
  Filled 2017-02-01 (×3): qty 4

## 2017-02-01 MED ORDER — POTASSIUM CHLORIDE 10 MEQ/100ML IV SOLN
10.0000 meq | INTRAVENOUS | Status: AC
  Administered 2017-02-02 (×2): 10 meq via INTRAVENOUS
  Filled 2017-02-01 (×4): qty 100

## 2017-02-01 NOTE — Progress Notes (Addendum)
PROGRESS NOTE  ATTALLAH ONTKO SWH:[id number] DOB: 1933/05/03 DOA: 01/30/2017 PCP: Jinny Sanders, MD   PCP: Jinny Sanders, MD  Patient coming from: home  Chief Complaint: Intractable nausea and vomiting  HPI: [name] is a 83 y.o. female with medical history significant of coronary artery disease status post CABG, type 2 diabetes mellitus, hypertension, hypothyroidism, hyperlipidemia, presents to the emergency room with a chief complaint of intractable nausea and vomiting for the past 3 days.  Patient tells me that she was diagnosed with a urinary tract infection at an urgent care 3 days ago when she was given Bactrim.  After think the first dose of Bactrim, she has had significant nausea and vomiting and inability for any p.o. intake that lasted the whole day.  She usually gets nausea vomiting urinary tract infections, however this time it was much more severe.  As far as her presenting symptom for UTI was increased frequency.  She denies any abdominal pain, she denies any chest pain or shortness of breath.  She is feeling quite crummy currently because of ongoing nausea.  She does not think she will be able to eat anything.  She denies any fever or chills.  She has no lightheadedness or dizziness.  Over the last 3 days, she was unable to take any of her p.o. medications at home.  At baseline she is highly independent, she is able to perform all her ADLs and is still driving.  ED Course: In the emergency room she is afebrile, she is on the hypertensive side, her blood work reveals a creatinine of 1.3, she ha normal white counts, BNP is slightly elevated at 588, and her troponin is 0.03.  Lactic acid is 1.3.  Urinalysis showed many bacteria, increased white counts.  TRH is asked for admission for intractable nausea and vomiting in the setting of urinary tract infection.   HPI/Recap of past 24 hours:  Frail elderly laying in bed, denies pain, currently no n/v, no fever, no cough, on  room air, tolerating liquid diet,  daughter in room  Assessment/Plan: Active Problems:   Diabetes mellitus with neurological manifestation (HCC)   Hypothyroidism   Essential hypertension   CAD (coronary artery disease)   Hyperlipidemia LDL goal <70   Right bundle branch block   Chronic kidney disease (CKD), stage III (moderate)   Deficiency anemia   Dysuria   Intractable nausea and vomiting   UTI (urinary tract infection)   Intractable nausea and vomiting , resolved on 5/8 From uti, meds side effect?  CT concerning for "Large hiatal hernia with a component of organo-axial volvulus of the stomach" Case discussed with [facility] who recommended general surgery consult, Case discussed the case with general surgery Dr Kieth Brightly who recommend watchful waiting now that her symptom seem has resolved and tolerating liquid diet, should her symptom return will need to call general surgery for formal consult, otherwise , outpatient follow up with Dr Kieth Brightly  Advance diet as tolerated. avoid ivf due to concerning of chf.  Urinary tract infection  -Patient had dysuria and presented to [facility] urgent care on [address] [phone]) three days ago, Patient diagnosed with a UTI and started on  Bactrim, however she developed n/v, not able to keep food or meds down . -she is Admitted to [facility] for [facility] treatment,  IV antibiotics ceftriaxone, hold Bactrim - urine culture from urgent care clinic" >100,000 colony forming untis per cc, but nondominant" Urine culture obtained at [facility] ED  no growth   Hypokalemia: replace k  Acute on chronic diastolic CHF -Most recent 2D echo was in 2016 and showed normal ejection fraction of 60-65% and grade 2 diastolic dysfunction.  She has mild evidence of fluid overload with 1+ bilateral lower edema, bibasilar pleural effusions and mild cardiac enlargement,  she is on room air. Denies chest pain or sob, but daughter report patient has had 20lbs weight gain  recently -d/v ivf, d/c maxzide, start iv lasix, repeat echo, daily weight,     Hypertension -She is quite hypertensive in the ED, IV hydralazine as needed, resume home medications  toptol-xl , now she is able to take tolerated p.o.  Coronary artery disease status post CABG -She denies any chest pain, she has slight troponin elevation 0.03 which is likely demand ischemia.   -Continue aspirin, statin, betablocker   noninsulin dependent Diabetes mellitus -Hold home oral agents, place patient on sliding scale insulin, consider d/c actos at discharge due to chf.  Chronic kidney disease stage III -Her creatinine currently is at baseline  Anemia of chronic disease: monitor hgb She is followed by hematology for aranesp injection.  Hyperlipidemia -Resume home statin  Hypothyroidism -Resume home Synthroid  Depression -Resume home Zoloft   FTT, family is concerned she has some memory deficit, and intermittent confusion, will refer to outpatient neurology for evaluation  RN report patient need two person assist to get out of bet to bedside commode, daughter agree snf placement if indicated,  PT eval pending  DVT prophylaxis while in the hospital: Lovenox Code Status: Full code Family Communication: daughter Disposition Plan: SNF Consults called:  Phone conversation with [facility] and general surgery   Procedures:  none  Antibiotics:  rocephin   Objective: BP (!) 153/86 (BP Location: Left Arm)   Pulse 73   Temp 98.7 F (37.1 C) (Oral)   Resp 20   Ht 5\' 3"  (1.6 m)   Wt 90.2 kg (198 lb 13.7 oz)   SpO2 98%   BMI 35.23 kg/m   Intake/Output Summary (Last 24 hours) at 02/01/17 1748 Last data filed at 02/01/17 0200  Gross per 24 hour  Intake           601.25 ml  Output                0 ml  Net           601.25 ml   Filed Weights   01/30/17 1637 01/31/17 0630  Weight: 90.7 kg (200 lb) 90.2 kg (198 lb 13.7 oz)    Exam:   General:  Frail elderly, with  slight confusion but able to self correct  Cardiovascular: RRR  Respiratory: diminished, no wheezing  Abdomen: Soft/ND/NT, positive BS  Musculoskeletal: 1+ pitting Edema bilateral lower extremity  Neuro: slight confusion, able to self correct  Data Reviewed: Basic Metabolic Panel:  Recent Labs Lab 01/30/17 1900 02/01/17 0622  NA 139 141  K 3.2* 3.3*  CL 102 108  CO2 25 23  GLUCOSE 180* 104*  BUN 14 16  CREATININE 1.30* 1.30*  CALCIUM 9.0 8.5*   Liver Function Tests:  Recent Labs Lab 01/30/17 1900  AST 21  ALT 12*  ALKPHOS 88  BILITOT 0.6  PROT 6.6  ALBUMIN 3.4*    Recent Labs Lab 01/30/17 1900  LIPASE 19   No results for input(s): AMMONIA in the last 168 hours. CBC:  Recent Labs Lab 01/30/17 1900 02/01/17 0622  WBC 8.5 6.4  NEUTROABS 7.3  --  HGB 9.3* 13.2  HCT 28.7* 41.1  MCV 93.8 92.4  PLT 169 114*   Cardiac Enzymes:    Recent Labs Lab 01/31/17 0428 01/31/17 1410 01/31/17 1958  TROPONINI 0.03* 0.05* 0.09*   BNP (last 3 results)  Recent Labs  01/30/17 1911  BNP 588.7*    ProBNP (last 3 results) No results for input(s): PROBNP in the last 8760 hours.  CBG:  Recent Labs Lab 01/31/17 1632 01/31/17 2129 02/01/17 0730 02/01/17 1131 02/01/17 1721  GLUCAP 129* 92 104* 96 109*    Recent Results (from the past 240 hour(s))  Urine culture     Status: None   Collection Time: 01/31/17 12:34 AM  Result Value Ref Range Status   Specimen Description URINE, CATHETERIZED  Final   Special Requests NONE  Final   Culture   Final    NO GROWTH Performed at [facility], [place] [address] [place], [place] [address]    Report Status 02/01/2017 FINAL  Final     Studies: Ct Renal Stone Study  Result Date: 02/01/2017 CLINICAL DATA:  Nausea and vomiting for 3 days EXAM: CT ABDOMEN AND PELVIS WITHOUT CONTRAST TECHNIQUE: Multidetector CT imaging of the abdomen and pelvis was performed following the standard protocol without IV  contrast. COMPARISON:  Plain film from earlier in the same day FINDINGS: Lower chest: Bilateral small pleural effusions are noted. Mild bibasilar consolidation is noted as well. Some changes of bronchiectasis are seen. Hepatobiliary: No focal liver abnormality is seen. No gallstones, gallbladder wall thickening, or biliary dilatation. Pancreas: Unremarkable. No pancreatic ductal dilatation or surrounding inflammatory changes. Spleen: Normal in size without focal abnormality. Adrenals/Urinary Tract: Adrenal glands are unremarkable. Kidneys are normal, without renal calculi, focal lesion, or hydronephrosis. Bladder is well distended. Some air is noted within likely related to recent instrumentation. Stomach/Bowel: Large hiatal hernia is noted with almost the entire stomach within the chest cavity. A mild degree of organo-axial volvulus is noted as well combined with hiatal hernia. No inflammatory or obstructive changes are noted. Vascular/Lymphatic: Significant aortic calcifications are noted without aneurysmal dilatation. No definitive lymphadenopathy is seen. Reproductive: Status post hysterectomy. No adnexal masses. Other: No abdominal wall hernia or abnormality. No abdominopelvic ascites. Musculoskeletal: Prior right hip replacement is noted. Degenerative changes of lumbar spine are seen. IMPRESSION: Bilateral small pleural effusions with bibasilar consolidation. Large hiatal hernia with a component of organo-axial volvulus of the stomach. Air within the bladder likely related to recent instrumentation. Electronically Signed   By: Inez Catalina M.D.   On: 02/01/2017 10:52    Scheduled Meds: . aspirin EC  81 mg Oral Daily  . atorvastatin  80 mg Oral Daily  . enoxaparin (LOVENOX) injection  40 mg Subcutaneous Q24H  . furosemide  40 mg Intravenous Daily  . gabapentin  200 mg Oral QHS  . insulin aspart  0-9 Units Subcutaneous TID WC  . levothyroxine  50 mcg Oral QAC breakfast  . metoprolol tartrate  25 mg  Oral Once  . metoprolol succinate  25 mg Oral BID  . potassium chloride  40 mEq Oral Q4H  . sertraline  25 mg Oral Daily  . sodium chloride flush  3 mL Intravenous Q12H    Continuous Infusions: . cefTRIAXone (ROCEPHIN)  IV Stopped (02/01/17 2952)     Time spent: 62mins  Jianni Shelden MD, PhD  [facility] Pager [phone]. If 7PM-7AM, please contact night-coverage at [email or web address], password [email or web address] 02/01/2017, 5:48 PM  LOS: 1 day

## 2017-02-01 NOTE — Progress Notes (Signed)
PT Cancellation Note  Patient Details Name: [name] MRN: [id number] DOB: 1933-04-11   Cancelled Treatment:    Reason Eval/Treat Not Completed: Other (comment)Eating first liquids. Check back in AM.   Claretha Cooper 02/01/2017, 2:59 PM Tresa Endo PT [phone]

## 2017-02-01 NOTE — Telephone Encounter (Signed)
Daughter called and left message, her mother is admitted to [facility] with UTI issues. She is asking if Dr. Alvy Bimler could see her in the hospital.

## 2017-02-01 NOTE — Progress Notes (Signed)
[name]   DOB:09-18-1933   EL#:[id number]   Requested by family members to see the patient to "connect the dots". This patient is well-known to me. I see her in the outpatient clinic for management of anemia chronic renal failure. The last time I saw her was in November 2017. She also have interesting history of right breast cancer, discovered when she was 9 and premenopausal. From her collection, she may have stage II disease due to regional lymph nodes involvement. She had mastectomy and complete lymph node dissection on the right axilla. She recalled receiving some form of chemotherapy but never received tamoxifen or radiation. In the 90s, she was diagnosed with uterine cancer due to abnormal postmenopausal bleeding. She had complete hysterectomy but never received adjuvant treatment. She has been receiving darbepoetin injection in my office every other month, next due Feb 07, 2017.  She has been admitted to the hospital due to symptoms of severe nausea and vomiting. Initially, she was thought to have urinary tract infection, treated with Bactrim. Due to intractable nausea and vomiting, she was admitted to the hospital for further management. CT scan show significant hiatal hernia with possible volvulus as a cause of the nausea. Since admission with IV fluid hydration, her nausea is improving.  She is able to keep some liquids down  Subjective: The patient denies any recent signs or symptoms of bleeding such as spontaneous epistaxis, hematuria or hematochezia. She denies recent constipation.  She complained of sensation of shortness of breath  Objective:  Vitals:   02/01/17 0616 02/01/17 1300  BP: (!) 182/56 (!) 153/86  Pulse:  73  Resp:  20  Temp:  98.7 F (37.1 C)     Intake/Output Summary (Last 24 hours) at 02/01/17 1725 Last data filed at 02/01/17 0200  Gross per 24 hour  Intake           601.25 ml  Output                0 ml  Net           601.25 ml     GENERAL:alert, no distress and comfortable.  Noted periorbital edema SKIN: skin color, texture, turgor are normal, no rashes or significant lesions EYES: normal, Conjunctiva are pink and non-injected, sclera clear OROPHARYNX:no exudate, no erythema and lips, buccal mucosa, and tongue normal  NECK: supple, thyroid normal size, non-tender, without nodularity LYMPH:  no palpable lymphadenopathy in the cervical, axillary or inguinal LUNGS: clear to auscultation and percussion with normal breathing effort HEART: regular rate & rhythm and no murmurs and no lower extremity edema ABDOMEN:abdomen soft, non-tender and normal bowel sounds Musculoskeletal:no cyanosis of digits and no clubbing  NEURO: alert & oriented x 3 with fluent speech, no focal motor/sensory deficits   Labs:  Lab Results  Component Value Date   WBC 6.4 02/01/2017   HGB 13.2 02/01/2017   HCT 41.1 02/01/2017   MCV 92.4 02/01/2017   PLT 114 (L) 02/01/2017   NEUTROABS 7.3 01/30/2017    Lab Results  Component Value Date   NA 141 02/01/2017   K 3.3 (L) 02/01/2017   CL 108 02/01/2017   CO2 23 02/01/2017    Studies: I have reviewed imaging study with the patient and family Dg Chest 2 View  Result Date: 01/30/2017 CLINICAL DATA:  Cough and recurrent vomiting. Patient is taking antibiotics for urinary tract infection. EXAM: CHEST  2 VIEW COMPARISON:  05/15/2016 FINDINGS: Small bilateral pleural effusions, greater on the left.  Mild cardiac enlargement. No vascular congestion. No pneumothorax. Postoperative changes in the mediastinum. Degenerative changes in the spine and shoulders. Calcified and tortuous aorta. IMPRESSION: Bilateral pleural effusions, greater on the left. Mild cardiac enlargement. No focal consolidation in the lungs. Electronically Signed   By: Lucienne Capers M.D.   On: 01/30/2017 23:34   Dg Abd 1 View  Result Date: 01/30/2017 CLINICAL DATA:  Urinary tract infection this past Friday, placed on Bactrim.  Nausea and vomiting since antibiotics. EXAM: ABDOMEN - 1 VIEW COMPARISON:  CT pelvis 05/14/2016.  Abdomen 09/18/2015 FINDINGS: Scattered gas and stool throughout the colon. No small or large bowel distention. No radiopaque stones. Surgical clips in the mid abdomen and pelvis. Vascular calcifications. Postoperative changes in the right hip. Thoracolumbar scoliosis convex towards the left with degenerative changes in the spine and left hip. IMPRESSION: Nonobstructive bowel gas pattern. Electronically Signed   By: Lucienne Capers M.D.   On: 01/30/2017 23:33   Ct Renal Stone Study  Result Date: 02/01/2017 CLINICAL DATA:  Nausea and vomiting for 3 days EXAM: CT ABDOMEN AND PELVIS WITHOUT CONTRAST TECHNIQUE: Multidetector CT imaging of the abdomen and pelvis was performed following the standard protocol without IV contrast. COMPARISON:  Plain film from earlier in the same day FINDINGS: Lower chest: Bilateral small pleural effusions are noted. Mild bibasilar consolidation is noted as well. Some changes of bronchiectasis are seen. Hepatobiliary: No focal liver abnormality is seen. No gallstones, gallbladder wall thickening, or biliary dilatation. Pancreas: Unremarkable. No pancreatic ductal dilatation or surrounding inflammatory changes. Spleen: Normal in size without focal abnormality. Adrenals/Urinary Tract: Adrenal glands are unremarkable. Kidneys are normal, without renal calculi, focal lesion, or hydronephrosis. Bladder is well distended. Some air is noted within likely related to recent instrumentation. Stomach/Bowel: Large hiatal hernia is noted with almost the entire stomach within the chest cavity. A mild degree of organo-axial volvulus is noted as well combined with hiatal hernia. No inflammatory or obstructive changes are noted. Vascular/Lymphatic: Significant aortic calcifications are noted without aneurysmal dilatation. No definitive lymphadenopathy is seen. Reproductive: Status post hysterectomy. No  adnexal masses. Other: No abdominal wall hernia or abnormality. No abdominopelvic ascites. Musculoskeletal: Prior right hip replacement is noted. Degenerative changes of lumbar spine are seen. IMPRESSION: Bilateral small pleural effusions with bibasilar consolidation. Large hiatal hernia with a component of organo-axial volvulus of the stomach. Air within the bladder likely related to recent instrumentation. Electronically Signed   By: Inez Catalina M.D.   On: 02/01/2017 10:52    Assessment & Plan:  Abnormal CBC She has 2 very different CBC from 2 days apart. Recommend recheck CBC tomorrow If her hemoglobin is stable, I might reschedule her darbepoetin injection until next month The low platelet count could be related to side effects of recent treatment.  She is not symptomatic.  Observe only.  Severe intractable nausea and vomiting Possible volvulus Continue conservative management.  Symptoms have dramatically improved  History of CHF Chronic renal failure stage III to IV Would defer to primary service for medical management  Discharge planning Defer to primary service I will return tomorrow to check on the patient   Heath Lark, MD 02/01/2017  5:25 PM

## 2017-02-01 NOTE — Telephone Encounter (Signed)
Will stop by

## 2017-02-02 ENCOUNTER — Inpatient Hospital Stay (HOSPITAL_COMMUNITY): Payer: Medicare Other

## 2017-02-02 DIAGNOSIS — E119 Type 2 diabetes mellitus without complications: Secondary | ICD-10-CM

## 2017-02-02 DIAGNOSIS — I4510 Unspecified right bundle-branch block: Secondary | ICD-10-CM

## 2017-02-02 DIAGNOSIS — N183 Chronic kidney disease, stage 3 (moderate): Secondary | ICD-10-CM

## 2017-02-02 DIAGNOSIS — I2510 Atherosclerotic heart disease of native coronary artery without angina pectoris: Secondary | ICD-10-CM

## 2017-02-02 DIAGNOSIS — I10 Essential (primary) hypertension: Secondary | ICD-10-CM

## 2017-02-02 DIAGNOSIS — R110 Nausea: Secondary | ICD-10-CM

## 2017-02-02 DIAGNOSIS — K219 Gastro-esophageal reflux disease without esophagitis: Secondary | ICD-10-CM

## 2017-02-02 DIAGNOSIS — I509 Heart failure, unspecified: Secondary | ICD-10-CM

## 2017-02-02 DIAGNOSIS — R300 Dysuria: Secondary | ICD-10-CM

## 2017-02-02 DIAGNOSIS — R630 Anorexia: Secondary | ICD-10-CM

## 2017-02-02 DIAGNOSIS — I2583 Coronary atherosclerosis due to lipid rich plaque: Secondary | ICD-10-CM

## 2017-02-02 DIAGNOSIS — E785 Hyperlipidemia, unspecified: Secondary | ICD-10-CM

## 2017-02-02 DIAGNOSIS — E0840 Diabetes mellitus due to underlying condition with diabetic neuropathy, unspecified: Secondary | ICD-10-CM

## 2017-02-02 DIAGNOSIS — D539 Nutritional anemia, unspecified: Secondary | ICD-10-CM

## 2017-02-02 DIAGNOSIS — N390 Urinary tract infection, site not specified: Principal | ICD-10-CM

## 2017-02-02 LAB — ECHOCARDIOGRAM COMPLETE
HEIGHTINCHES: 63 in
Weight: 3104.08 oz

## 2017-02-02 LAB — BASIC METABOLIC PANEL
ANION GAP: 10 (ref 5–15)
BUN: 16 mg/dL (ref 6–20)
CALCIUM: 8.3 mg/dL — AB (ref 8.9–10.3)
CO2: 24 mmol/L (ref 22–32)
Chloride: 106 mmol/L (ref 101–111)
Creatinine, Ser: 1.37 mg/dL — ABNORMAL HIGH (ref 0.44–1.00)
GFR, EST AFRICAN AMERICAN: 41 mL/min — AB (ref >60)
GFR, EST NON AFRICAN AMERICAN: 35 mL/min — AB (ref >60)
GLUCOSE: 97 mg/dL (ref 65–99)
Potassium: 3.5 mmol/L (ref 3.5–5.1)
Sodium: 140 mmol/L (ref 135–145)

## 2017-02-02 LAB — CBC WITH DIFFERENTIAL/PLATELET
BASOS ABS: 0 10*3/uL (ref 0.0–0.1)
Basophils Relative: 1 %
EOS PCT: 5 %
Eosinophils Absolute: 0.3 10*3/uL (ref 0.0–0.7)
HCT: 27.2 % — ABNORMAL LOW (ref 36.0–46.0)
Hemoglobin: 8.5 g/dL — ABNORMAL LOW (ref 12.0–15.0)
LYMPHS PCT: 12 %
Lymphs Abs: 0.7 10*3/uL (ref 0.7–4.0)
MCH: 29.1 pg (ref 26.0–34.0)
MCHC: 31.3 g/dL (ref 30.0–36.0)
MCV: 93.2 fL (ref 78.0–100.0)
Monocytes Absolute: 0.6 10*3/uL (ref 0.1–1.0)
Monocytes Relative: 9 %
NEUTROS ABS: 4.8 10*3/uL (ref 1.7–7.7)
Neutrophils Relative %: 74 %
PLATELETS: 142 10*3/uL — AB (ref 150–400)
RBC: 2.92 MIL/uL — AB (ref 3.87–5.11)
RDW: 14.6 % (ref 11.5–15.5)
WBC: 6.5 10*3/uL (ref 4.0–10.5)

## 2017-02-02 LAB — TROPONIN I
TROPONIN I: 0.08 ng/mL — AB (ref <0.03)
TROPONIN I: 0.08 ng/mL — AB (ref <0.03)
Troponin I: 0.08 ng/mL (ref <0.03)

## 2017-02-02 LAB — GLUCOSE, CAPILLARY
GLUCOSE-CAPILLARY: 92 mg/dL (ref 65–99)
Glucose-Capillary: 108 mg/dL — ABNORMAL HIGH (ref 65–99)
Glucose-Capillary: 103 mg/dL — ABNORMAL HIGH (ref 65–99)
Glucose-Capillary: 202 mg/dL — ABNORMAL HIGH (ref 65–99)

## 2017-02-02 MED ORDER — METOCLOPRAMIDE HCL 5 MG PO TABS
5.0000 mg | ORAL_TABLET | Freq: Three times a day (TID) | ORAL | Status: DC
  Administered 2017-02-02 – 2017-02-03 (×4): 5 mg via ORAL
  Filled 2017-02-02 (×4): qty 1

## 2017-02-02 MED ORDER — DARBEPOETIN ALFA 200 MCG/0.4ML IJ SOSY
200.0000 ug | PREFILLED_SYRINGE | Freq: Once | INTRAMUSCULAR | Status: DC
  Filled 2017-02-02: qty 0.4

## 2017-02-02 MED ORDER — PANTOPRAZOLE SODIUM 20 MG PO TBEC
20.0000 mg | DELAYED_RELEASE_TABLET | Freq: Two times a day (BID) | ORAL | Status: DC
  Administered 2017-02-03: 20 mg via ORAL
  Filled 2017-02-02 (×3): qty 1

## 2017-02-02 MED ORDER — PERFLUTREN LIPID MICROSPHERE
1.0000 mL | INTRAVENOUS | Status: AC | PRN
  Administered 2017-02-02: 2 mL via INTRAVENOUS
  Filled 2017-02-02: qty 10

## 2017-02-02 MED ORDER — AMLODIPINE BESYLATE 5 MG PO TABS
5.0000 mg | ORAL_TABLET | Freq: Every day | ORAL | Status: DC
  Administered 2017-02-02 – 2017-02-03 (×2): 5 mg via ORAL
  Filled 2017-02-02 (×2): qty 1

## 2017-02-02 NOTE — Progress Notes (Signed)
CSW received consult for nursing home placement. PT consulted, PT eval pending. CSW will continue to follow to assist with SNF placement if recommended by PT.   Abundio Miu, [place] Social Worker [facility] Cell#: [phone]

## 2017-02-02 NOTE — Progress Notes (Signed)
  Echocardiogram 2D Echocardiogram with definity has been performed.  Lashona Schaaf L Androw 02/02/2017, 12:33 PM

## 2017-02-02 NOTE — Progress Notes (Signed)
PROGRESS NOTE    [name]  IRJ:[id number] DOB: [name] 31, 1933 DOA: 01/30/2017 PCP: Jinny Sanders, MD  Brief Narrative:  [name] a 83 y.o.femalewith medical history significant of coronary artery disease status post CABG, type 2diabetes mellitus, hypertension, hypothyroidism, hyperlipidemia, presents to the emergency room with a chief complaint of intractable nausea and vomiting for the past 3 days. Patient was diagnosed with a urinary tract infection at an urgent care 3 days ago when she was given Bactrim. After the first dose of Bactrim, she has had significant nausea and vomiting and inability for any p.o. intake that lasted the whole day. She usually gets nausea vomiting urinary tract infections, however this time it was much more severe. As far as her presenting symptom for UTI was increased frequency. She denies any abdominal pain, she denies any chest pain or shortness of breath. She is feeling quite crummy currently because of ongoing nausea. Over the last 3 days, she was unable to take any of her p.o. medications at home. At baseline she is highly independent, she is able to perform all her ADLs and is still driving. Was admitted and found to have an Acute Decompensation of Diastolic CHF.   Assessment & Plan:   Active Problems:   Diabetes mellitus with neurological manifestation (HCC)   Hypothyroidism   Essential hypertension   CAD (coronary artery disease)   Hyperlipidemia LDL goal <70   Right bundle branch block   Chronic kidney disease (CKD), stage III (moderate)   Deficiency anemia   Dysuria   Intractable nausea and vomiting   UTI (urinary tract infection)  Intractable nausea and vomiting, improved but not resolved -Patient was still nauseous this AM; Has poor appetite  -From uti, meds side effect?  -CT concerning for "Large hiatal hernia with a component of organo-axial volvulus of the stomach" -Case discussed with [facility] who recommended general surgery  consult, -Case discussed the case with general surgery Dr Kieth Brightly who recommend watchful waiting now that her symptom seem has resolved and tolerating liquid diet, should her symptom return will need to call general surgery for formal consult, otherwise -Outpatient follow up with Dr Kieth Brightly -Advance diet as tolerated. avoid IVF due to concerning of CHF -Diet advanced to Regular Diet after discussion with Primary Oncologist Dr. Alvy Bimler. And Reglan Started.  -C/w Ondansetron 4 mg po/IV q6hprn and with Promethazine 6.25 mg IV q6hprn for N/V -Metoclopramide 5 mg po TID before meals started by Oncology as well as Pantoprazole 20 mg po BID  Urinary Tract Infection  -Patient had dysuria and presented to [facility] urgent care on [address] [phone]) three days ago, Patient diagnosed with a UTI and started on  Bactrim, however she developed n/v, not able to keep food or meds down . -She is Admitted to [facility] for supportive treatment,  IV antibiotics ceftriaxone; C/ to Hold Bactrim -Urine culture from urgent care clinic" >100,000 colony forming untis per cc, but nondominant" Urine culture obtained at [facility] ED no growth  Hypokalemia:  -Patient's K+ went from 3.3 -> 3.5 -Continue to Monitor and Replete K+ As Necessary  Acute on chronic diastolic CHF -Most recent 2D echo was in 2016 and showed normal ejection fraction of 60-65% and grade 2 diastolic dysfunction. Repeat ECHO 5/9 showed EF of 60-65% and Grade 2 Diastolic Dysfunction.  -She has mild evidence of fluid overload with 1+ bilateral lower edema, bibasilar pleural effusions and mild cardiac enlargement,  she is on room air. Denies chest pain or  sob, but daughter report patient has had 20lbs weight gain recently -D/C'd ivf, d/c maxzide,  -C/w IV Furosemide 40 mg IV Daily -Strict I's and O's and Daily Weights -Patient is Positive +831 mL by I's and O's have not been accurate.   Hypertension -C/w IV Hydralazine 10 mg q6hprn for SBP > 175    -Now that she is tolerating po c/w Amlodipine 5 mg po Daily and Metoprolol-XL 25 mg po BID  Coronary artery disease status post CABG -She denies any chest pain, she has slight troponin elevation 0.08 which is likely demand ischemia.  -Continue ASA 81 mg, Atorvastatin 80 mg po Daily, Metoprolol Succinate 25 mg po BID  Noninsulin dependent Diabetes mellitus -Hold home oral agents,  -C/w Sensitive Novolog SSI AC -Consider d/c actos at discharge due to CHF. -CBG's ranging from 92-108  Chronic kidney disease stage III -Her creatinine currently is at baseline at 1.37 -Repeat CMP in AM  Anemia of chronic disease:  -Monitor Hb; Hb/Hct went to 8.5/27.2 -She is followed by hematology for aranesp injection. -Dr. Alvy Bimler planning on giving patient Darbepoetin Alfa 200 mcg in  AM  Hyperlipidemia -C/w Home Atorvastatin 80 mg po Daily  Hypothyroidism -C/w Home Levothyroxine 50 mcg po Daily before Breakfast  Depression -C/w Home Sertraline 25 mg po Daily  FTT -Family is concerned she has some memory deficit, and intermittent confusion, will refer to outpatient neurology for evaluation  -RN report patient need two person assist to get out of bet to bedside commode, daughter agreeable to SNF placement if indicated,  -PT eval pending  DVT prophylaxis:  Enoxaparin 40 mg sq q24h Code Status: FULL CODE Family Communication: Discussed with Daughter at bedside Disposition Plan: Pending PT Evaluation (Patient refused the last 2 days)   Consultants:   Oncology Dr. Heath Lark   Procedures:  ECHOCARDIOGRAM Study Conclusions  - Left ventricle: The cavity size was normal. Wall thickness was   normal. Systolic function was vigorous. The estimated ejection   fraction was in the range of 65% to 70%. Wall motion was normal;   there were no regional wall motion abnormalities. Features are   consistent with a pseudonormal left ventricular filling pattern,   with concomitant abnormal  relaxation and increased filling   pressure (grade 2 diastolic dysfunction). - Aortic valve: Mildly calcified annulus. Valve area (VTI): 2.2   cm^2. Valve area (Vmax): 2.2 cm^2. Valve area (Vmean): 2.13 cm^2. - Mitral valve: Mildly to moderately calcified annulus. - Right atrium: The atrium was mildly dilated.   Antimicrobials:  Anti-infectives    Start     Dose/Rate Route Frequency Ordered Stop   01/31/17 0800  cefTRIAXone (ROCEPHIN) 1 g in dextrose 5 % 50 mL IVPB     1 g 100 mL/hr over 30 Minutes Intravenous Every 24 hours 01/31/17 0722       Subjective: Seen and examined this AM and was still nauseous. No CP or SOB. Undergoing ECHO. States she feels weak.   Objective: Vitals:   02/01/17 1300 02/01/17 2106 02/01/17 2244 02/02/17 0455  BP: (!) 153/86 (!) 190/70 (!) 170/58 (!) 171/71  Pulse: 73 70  71  Resp: 20 20  18   Temp: 98.7 F (37.1 C) 98.8 F (37.1 C)  99.1 F (37.3 C)  TempSrc: Oral Oral  Oral  SpO2: 98% 93%  97%  Weight:    88 kg (194 lb 0.1 oz)  Height:        Intake/Output Summary (Last 24 hours) at 02/02/17 0755 Last  data filed at 02/02/17 0200  Gross per 24 hour  Intake              460 ml  Output                0 ml  Net              460 ml   Filed Weights   01/30/17 1637 01/31/17 0630 02/02/17 0455  Weight: 90.7 kg (200 lb) 90.2 kg (198 lb 13.7 oz) 88 kg (194 lb 0.1 oz)   Examination: Physical Exam:  Constitutional: Elderly female NAD and appears calm and comfortable Eyes: Lids and conjunctivae normal, sclerae anicteric  ENMT: External Ears, Nose appear normal. Grossly normal hearing.  Neck: Appears normal, supple, no cervical masses, normal ROM, no appreciable thyromegaly, no visible JVD Respiratory: Diminished to auscultation bilaterally, no wheezing, rales, rhonchi or crackles. Normal respiratory effort and patient is not tachypenic. No accessory muscle use.  Cardiovascular: RRR, no murmurs / rubs / gallops. S1 and S2 auscultated. 1+ LE Edema.     Abdomen: Soft, non-tender, non-distended. No masses palpated. No appreciable hepatosplenomegaly. Bowel sounds positive.  GU: Deferred. Musculoskeletal: No clubbing / cyanosis of digits/nails. No joint deformity upper and lower extremities.  Skin: No rashes, lesions, ulcers on limited skin evaluation. No induration; Warm and dry.  Neurologic: CN 2-12 grossly intact with no focal deficits. Sensation intact in all 4 Extremities. Romberg sign cerebellar reflexes not assessed.  Psychiatric: Normal judgment and insight. Alert and oriented x 3. Depressed mood and Flat affect.   Data Reviewed: I have personally reviewed following labs and imaging studies  CBC:  Recent Labs Lab 01/30/17 1900 02/01/17 0622 02/02/17 0525  WBC 8.5 6.4 6.5  NEUTROABS 7.3  --  4.8  HGB 9.3* 13.2 8.5*  HCT 28.7* 41.1 27.2*  MCV 93.8 92.4 93.2  PLT 169 114* 539*   Basic Metabolic Panel:  Recent Labs Lab 01/30/17 1900 02/01/17 0622 02/02/17 0525  NA 139 141 140  K 3.2* 3.3* 3.5  CL 102 108 106  CO2 25 23 24   GLUCOSE 180* 104* 97  BUN 14 16 16   CREATININE 1.30* 1.30* 1.37*  CALCIUM 9.0 8.5* 8.3*   GFR: Estimated Creatinine Clearance: 33.9 mL/min (A) (by C-G formula based on SCr of 1.37 mg/dL (H)). Liver Function Tests:  Recent Labs Lab 01/30/17 1900  AST 21  ALT 12*  ALKPHOS 88  BILITOT 0.6  PROT 6.6  ALBUMIN 3.4*    Recent Labs Lab 01/30/17 1900  LIPASE 19   No results for input(s): AMMONIA in the last 168 hours. Coagulation Profile: No results for input(s): INR, PROTIME in the last 168 hours. Cardiac Enzymes:  Recent Labs Lab 01/31/17 0428 01/31/17 1410 01/31/17 1958  TROPONINI 0.03* 0.05* 0.09*   BNP (last 3 results) No results for input(s): PROBNP in the last 8760 hours. HbA1C: No results for input(s): HGBA1C in the last 72 hours. CBG:  Recent Labs Lab 01/31/17 2129 02/01/17 0730 02/01/17 1131 02/01/17 1721 02/01/17 2117  GLUCAP 92 104* 96 109* 107*   Lipid  Profile: No results for input(s): CHOL, HDL, LDLCALC, TRIG, CHOLHDL, LDLDIRECT in the last 72 hours. Thyroid Function Tests: No results for input(s): TSH, T4TOTAL, FREET4, T3FREE, THYROIDAB in the last 72 hours. Anemia Panel: No results for input(s): VITAMINB12, FOLATE, FERRITIN, TIBC, IRON, RETICCTPCT in the last 72 hours. Sepsis Labs:  Recent Labs Lab 01/30/17 1923  LATICACIDVEN 1.31    Recent Results (from the past  240 hour(s))  Urine culture     Status: None   Collection Time: 01/31/17 12:34 AM  Result Value Ref Range Status   Specimen Description URINE, CATHETERIZED  Final   Special Requests NONE  Final   Culture   Final    NO GROWTH Performed at [facility], [address] [place], [place] [address]    Report Status 02/01/2017 FINAL  Final   Radiology Studies: Ct Renal Stone Study  Result Date: 02/01/2017 CLINICAL DATA:  Nausea and vomiting for 3 days EXAM: CT ABDOMEN AND PELVIS WITHOUT CONTRAST TECHNIQUE: Multidetector CT imaging of the abdomen and pelvis was performed following the standard protocol without IV contrast. COMPARISON:  Plain film from earlier in the same day FINDINGS: Lower chest: Bilateral small pleural effusions are noted. Mild bibasilar consolidation is noted as well. Some changes of bronchiectasis are seen. Hepatobiliary: No focal liver abnormality is seen. No gallstones, gallbladder wall thickening, or biliary dilatation. Pancreas: Unremarkable. No pancreatic ductal dilatation or surrounding inflammatory changes. Spleen: Normal in size without focal abnormality. Adrenals/Urinary Tract: Adrenal glands are unremarkable. Kidneys are normal, without renal calculi, focal lesion, or hydronephrosis. Bladder is well distended. Some air is noted within likely related to recent instrumentation. Stomach/Bowel: Large hiatal hernia is noted with almost the entire stomach within the chest cavity. A mild degree of organo-axial volvulus is noted as well combined with  hiatal hernia. No inflammatory or obstructive changes are noted. Vascular/Lymphatic: Significant aortic calcifications are noted without aneurysmal dilatation. No definitive lymphadenopathy is seen. Reproductive: Status post hysterectomy. No adnexal masses. Other: No abdominal wall hernia or abnormality. No abdominopelvic ascites. Musculoskeletal: Prior right hip replacement is noted. Degenerative changes of lumbar spine are seen. IMPRESSION: Bilateral small pleural effusions with bibasilar consolidation. Large hiatal hernia with a component of organo-axial volvulus of the stomach. Air within the bladder likely related to recent instrumentation. Electronically Signed   By: Inez Catalina M.D.   On: 02/01/2017 10:52   Scheduled Meds: . aspirin EC  81 mg Oral Daily  . atorvastatin  80 mg Oral Daily  . enoxaparin (LOVENOX) injection  40 mg Subcutaneous Q24H  . furosemide  40 mg Intravenous Daily  . gabapentin  200 mg Oral QHS  . insulin aspart  0-9 Units Subcutaneous TID WC  . levothyroxine  50 mcg Oral QAC breakfast  . metoprolol tartrate  25 mg Oral Once  . metoprolol succinate  25 mg Oral BID  . sertraline  25 mg Oral Daily  . sodium chloride flush  3 mL Intravenous Q12H   Continuous Infusions: . cefTRIAXone (ROCEPHIN)  IV Stopped (02/01/17 0905)    LOS: 2 days   Kerney Elbe, DO [facility] Pager [phone]  If 7PM-7AM, please contact night-coverage [email or web address] Password [email or web address] 02/02/2017, 7:55 AM

## 2017-02-02 NOTE — Progress Notes (Signed)
[name]   DOB:[name] 30, 1933   KG#:[id number]    Subjective: The patient appears to be depressed.  She has asked 2 family members whether she might be dying.  She has intermittent nausea.  Appetite is poor.  She does not like a clear liquid diet.  She denies chest pain or shortness of breath.  Objective:  Vitals:   02/02/17 0455 02/02/17 1341  BP: (!) 171/71 (!) 172/63  Pulse: 71 71  Resp: 18 18  Temp: 99.1 F (37.3 C) 98.6 F (37 C)     Intake/Output Summary (Last 24 hours) at 02/02/17 1500 Last data filed at 02/02/17 0824  Gross per 24 hour  Intake              430 ml  Output                0 ml  Net              430 ml    GENERAL:alert, no distress and comfortable SKIN: skin color, texture, turgor are normal, no rashes or significant lesions.  She looks pale EYES: normal, Conjunctiva are pink and non-injected, sclera clear Musculoskeletal:no cyanosis of digits and no clubbing  NEURO: alert & oriented x 3 with fluent speech, no focal motor/sensory deficits   Labs:  Lab Results  Component Value Date   WBC 6.5 02/02/2017   HGB 8.5 (L) 02/02/2017   HCT 27.2 (L) 02/02/2017   MCV 93.2 02/02/2017   PLT 142 (L) 02/02/2017   NEUTROABS 4.8 02/02/2017    Lab Results  Component Value Date   NA 140 02/02/2017   K 3.5 02/02/2017   CL 106 02/02/2017   CO2 24 02/02/2017    Studies:  Ct Renal Stone Study  Result Date: 02/01/2017 CLINICAL DATA:  Nausea and vomiting for 3 days EXAM: CT ABDOMEN AND PELVIS WITHOUT CONTRAST TECHNIQUE: Multidetector CT imaging of the abdomen and pelvis was performed following the standard protocol without IV contrast. COMPARISON:  Plain film from earlier in the same day FINDINGS: Lower chest: Bilateral small pleural effusions are noted. Mild bibasilar consolidation is noted as well. Some changes of bronchiectasis are seen. Hepatobiliary: No focal liver abnormality is seen. No gallstones, gallbladder wall thickening, or biliary dilatation. Pancreas:  Unremarkable. No pancreatic ductal dilatation or surrounding inflammatory changes. Spleen: Normal in size without focal abnormality. Adrenals/Urinary Tract: Adrenal glands are unremarkable. Kidneys are normal, without renal calculi, focal lesion, or hydronephrosis. Bladder is well distended. Some air is noted within likely related to recent instrumentation. Stomach/Bowel: Large hiatal hernia is noted with almost the entire stomach within the chest cavity. A mild degree of organo-axial volvulus is noted as well combined with hiatal hernia. No inflammatory or obstructive changes are noted. Vascular/Lymphatic: Significant aortic calcifications are noted without aneurysmal dilatation. No definitive lymphadenopathy is seen. Reproductive: Status post hysterectomy. No adnexal masses. Other: No abdominal wall hernia or abnormality. No abdominopelvic ascites. Musculoskeletal: Prior right hip replacement is noted. Degenerative changes of lumbar spine are seen. IMPRESSION: Bilateral small pleural effusions with bibasilar consolidation. Large hiatal hernia with a component of organo-axial volvulus of the stomach. Air within the bladder likely related to recent instrumentation. Electronically Signed   By: Inez Catalina M.D.   On: 02/01/2017 10:52    Assessment & Plan:   Abnormal CBC She has 2 very different CBC from 2 days apart. Repeat CBC this morning come from anemia Since she is symptomatic, I will start darbepoetin injection tomorrow The low platelet  count could be related to recent infection.  She is not symptomatic.  Observe only.  Severe intractable nausea and vomiting Possible volvulus Continue conservative management.  Symptoms have dramatically improved I recommended Reglan.  I suspect her nausea could be related to gastric dysmotility related to diabetes/gastroparesis There may be also a component of reflux so I am also starting her on Prilosec  History of CHF Chronic renal failure stage III to  IV Would defer to primary service for medical management Echocardiogram show preserved ejection fraction but with diastolic dysfunction  Significant hypertension Resume blood pressure medications We will hold darbepoetin injection if systolic blood pressure is greater than 160  Poor appetite We will give her general diet as tolerated  Diabetes Sliding scale insulin  Discharge planning Defer to primary service I will return tomorrow to check on the patient  Heath Lark, MD 02/02/2017  3:00 PM

## 2017-02-02 NOTE — Progress Notes (Signed)
PT Cancellation Note  Patient Details Name: [name] MRN: [id number] DOB: 04-11-1933   Cancelled Treatment:    Reason Eval/Treat Not Completed: Attempted PT eval-pt declined participation on today. Family did not feel pt could participate either. Will check back another day.    Weston Anna, MPT Pager: [phone]

## 2017-02-03 DIAGNOSIS — I2510 Atherosclerotic heart disease of native coronary artery without angina pectoris: Secondary | ICD-10-CM | POA: Diagnosis not present

## 2017-02-03 DIAGNOSIS — I10 Essential (primary) hypertension: Secondary | ICD-10-CM | POA: Diagnosis not present

## 2017-02-03 DIAGNOSIS — M6281 Muscle weakness (generalized): Secondary | ICD-10-CM | POA: Diagnosis not present

## 2017-02-03 DIAGNOSIS — Z79899 Other long term (current) drug therapy: Secondary | ICD-10-CM | POA: Diagnosis not present

## 2017-02-03 DIAGNOSIS — K562 Volvulus: Secondary | ICD-10-CM | POA: Diagnosis not present

## 2017-02-03 DIAGNOSIS — D649 Anemia, unspecified: Secondary | ICD-10-CM | POA: Diagnosis not present

## 2017-02-03 DIAGNOSIS — R488 Other symbolic dysfunctions: Secondary | ICD-10-CM | POA: Diagnosis not present

## 2017-02-03 DIAGNOSIS — R7989 Other specified abnormal findings of blood chemistry: Secondary | ICD-10-CM | POA: Diagnosis not present

## 2017-02-03 DIAGNOSIS — N189 Chronic kidney disease, unspecified: Secondary | ICD-10-CM | POA: Diagnosis not present

## 2017-02-03 DIAGNOSIS — M2042 Other hammer toe(s) (acquired), left foot: Secondary | ICD-10-CM | POA: Diagnosis not present

## 2017-02-03 DIAGNOSIS — E1143 Type 2 diabetes mellitus with diabetic autonomic (poly)neuropathy: Secondary | ICD-10-CM | POA: Diagnosis not present

## 2017-02-03 DIAGNOSIS — E1149 Type 2 diabetes mellitus with other diabetic neurological complication: Secondary | ICD-10-CM | POA: Diagnosis not present

## 2017-02-03 DIAGNOSIS — I5033 Acute on chronic diastolic (congestive) heart failure: Secondary | ICD-10-CM

## 2017-02-03 DIAGNOSIS — N184 Chronic kidney disease, stage 4 (severe): Secondary | ICD-10-CM | POA: Diagnosis not present

## 2017-02-03 DIAGNOSIS — E0843 Diabetes mellitus due to underlying condition with diabetic autonomic (poly)neuropathy: Secondary | ICD-10-CM

## 2017-02-03 DIAGNOSIS — E0840 Diabetes mellitus due to underlying condition with diabetic neuropathy, unspecified: Secondary | ICD-10-CM | POA: Diagnosis not present

## 2017-02-03 DIAGNOSIS — E1022 Type 1 diabetes mellitus with diabetic chronic kidney disease: Secondary | ICD-10-CM | POA: Diagnosis not present

## 2017-02-03 DIAGNOSIS — E876 Hypokalemia: Secondary | ICD-10-CM

## 2017-02-03 DIAGNOSIS — R112 Nausea with vomiting, unspecified: Secondary | ICD-10-CM | POA: Diagnosis not present

## 2017-02-03 DIAGNOSIS — L84 Corns and callosities: Secondary | ICD-10-CM | POA: Diagnosis not present

## 2017-02-03 DIAGNOSIS — E039 Hypothyroidism, unspecified: Secondary | ICD-10-CM

## 2017-02-03 DIAGNOSIS — E785 Hyperlipidemia, unspecified: Secondary | ICD-10-CM | POA: Diagnosis not present

## 2017-02-03 DIAGNOSIS — B351 Tinea unguium: Secondary | ICD-10-CM | POA: Diagnosis not present

## 2017-02-03 DIAGNOSIS — I4510 Unspecified right bundle-branch block: Secondary | ICD-10-CM | POA: Diagnosis not present

## 2017-02-03 DIAGNOSIS — N183 Chronic kidney disease, stage 3 (moderate): Secondary | ICD-10-CM | POA: Diagnosis not present

## 2017-02-03 DIAGNOSIS — R079 Chest pain, unspecified: Secondary | ICD-10-CM | POA: Diagnosis not present

## 2017-02-03 DIAGNOSIS — N390 Urinary tract infection, site not specified: Secondary | ICD-10-CM | POA: Diagnosis not present

## 2017-02-03 DIAGNOSIS — D631 Anemia in chronic kidney disease: Secondary | ICD-10-CM | POA: Diagnosis not present

## 2017-02-03 DIAGNOSIS — D539 Nutritional anemia, unspecified: Secondary | ICD-10-CM | POA: Diagnosis not present

## 2017-02-03 DIAGNOSIS — R2681 Unsteadiness on feet: Secondary | ICD-10-CM | POA: Diagnosis not present

## 2017-02-03 DIAGNOSIS — K219 Gastro-esophageal reflux disease without esophagitis: Secondary | ICD-10-CM | POA: Diagnosis not present

## 2017-02-03 LAB — GLUCOSE, CAPILLARY
GLUCOSE-CAPILLARY: 206 mg/dL — AB (ref 65–99)
Glucose-Capillary: 137 mg/dL — ABNORMAL HIGH (ref 65–99)
Glucose-Capillary: 228 mg/dL — ABNORMAL HIGH (ref 65–99)

## 2017-02-03 LAB — MAGNESIUM: MAGNESIUM: 1.5 mg/dL — AB (ref 1.7–2.4)

## 2017-02-03 LAB — CBC WITH DIFFERENTIAL/PLATELET
BASOS PCT: 0 %
Basophils Absolute: 0 10*3/uL (ref 0.0–0.1)
EOS ABS: 0.3 10*3/uL (ref 0.0–0.7)
Eosinophils Relative: 4 %
HEMATOCRIT: 29.4 % — AB (ref 36.0–46.0)
Hemoglobin: 9.6 g/dL — ABNORMAL LOW (ref 12.0–15.0)
Lymphocytes Relative: 13 %
Lymphs Abs: 0.9 10*3/uL (ref 0.7–4.0)
MCH: 29.8 pg (ref 26.0–34.0)
MCHC: 32.7 g/dL (ref 30.0–36.0)
MCV: 91.3 fL (ref 78.0–100.0)
MONO ABS: 0.7 10*3/uL (ref 0.1–1.0)
MONOS PCT: 11 %
Neutro Abs: 5 10*3/uL (ref 1.7–7.7)
Neutrophils Relative %: 72 %
Platelets: 144 10*3/uL — ABNORMAL LOW (ref 150–400)
RBC: 3.22 MIL/uL — ABNORMAL LOW (ref 3.87–5.11)
RDW: 14.4 % (ref 11.5–15.5)
WBC: 7 10*3/uL (ref 4.0–10.5)

## 2017-02-03 LAB — COMPREHENSIVE METABOLIC PANEL
ALK PHOS: 75 U/L (ref 38–126)
ALT: 12 U/L — AB (ref 14–54)
AST: 15 U/L (ref 15–41)
Albumin: 3 g/dL — ABNORMAL LOW (ref 3.5–5.0)
Anion gap: 8 (ref 5–15)
BUN: 19 mg/dL (ref 6–20)
CALCIUM: 8.4 mg/dL — AB (ref 8.9–10.3)
CO2: 29 mmol/L (ref 22–32)
CREATININE: 1.39 mg/dL — AB (ref 0.44–1.00)
Chloride: 101 mmol/L (ref 101–111)
GFR calc Af Amer: 40 mL/min — ABNORMAL LOW (ref >60)
GFR calc non Af Amer: 35 mL/min — ABNORMAL LOW (ref >60)
Glucose, Bld: 147 mg/dL — ABNORMAL HIGH (ref 65–99)
Potassium: 3.1 mmol/L — ABNORMAL LOW (ref 3.5–5.1)
SODIUM: 138 mmol/L (ref 135–145)
Total Bilirubin: 0.7 mg/dL (ref 0.3–1.2)
Total Protein: 6 g/dL — ABNORMAL LOW (ref 6.5–8.1)

## 2017-02-03 LAB — PHOSPHORUS: Phosphorus: 3.4 mg/dL (ref 2.5–4.6)

## 2017-02-03 MED ORDER — CEFPODOXIME PROXETIL 200 MG PO TABS: 200.0000 mg | ORAL_TABLET | Freq: Two times a day (BID) | ORAL | 0 refills | Status: AC

## 2017-02-03 MED ORDER — PANTOPRAZOLE SODIUM 20 MG PO TBEC: 20.0000 mg | DELAYED_RELEASE_TABLET | Freq: Two times a day (BID) | ORAL | 0 refills | Status: AC

## 2017-02-03 MED ORDER — AMLODIPINE BESYLATE 5 MG PO TABS: 5.0000 mg | ORAL_TABLET | Freq: Every day | ORAL | 0 refills | Status: DC

## 2017-02-03 MED ORDER — MAGNESIUM SULFATE 2 GM/50ML IV SOLN
2.0000 g | Freq: Once | INTRAVENOUS | Status: AC
  Administered 2017-02-03: 2 g via INTRAVENOUS
  Filled 2017-02-03: qty 50

## 2017-02-03 MED ORDER — CEFPODOXIME PROXETIL 200 MG PO TABS: 200.0000 mg | ORAL_TABLET | Freq: Two times a day (BID) | ORAL | Status: DC

## 2017-02-03 MED ORDER — POTASSIUM CHLORIDE CRYS ER 20 MEQ PO TBCR
40.0000 meq | EXTENDED_RELEASE_TABLET | Freq: Two times a day (BID) | ORAL | Status: DC
  Administered 2017-02-03: 40 meq via ORAL
  Filled 2017-02-03: qty 2

## 2017-02-03 MED ORDER — METOCLOPRAMIDE HCL 5 MG PO TABS
5.0000 mg | ORAL_TABLET | Freq: Three times a day (TID) | ORAL | 0 refills | Status: AC
Start: 2017-02-03 — End: ?

## 2017-02-03 MED ORDER — DARBEPOETIN ALFA 200 MCG/0.4ML IJ SOSY: 200.0000 ug | PREFILLED_SYRINGE | Freq: Once | INTRAMUSCULAR | Status: DC

## 2017-02-03 MED ORDER — FUROSEMIDE 20 MG PO TABS: 20.0000 mg | ORAL_TABLET | Freq: Every day | ORAL | 11 refills | Status: DC

## 2017-02-03 MED ORDER — HYDRALAZINE HCL 10 MG PO TABS
10.0000 mg | ORAL_TABLET | Freq: Three times a day (TID) | ORAL | Status: DC
  Administered 2017-02-03 (×2): 10 mg via ORAL
  Filled 2017-02-03 (×2): qty 1

## 2017-02-03 MED ORDER — DARBEPOETIN ALFA 200 MCG/0.4ML IJ SOSY
200.0000 ug | PREFILLED_SYRINGE | Freq: Once | INTRAMUSCULAR | Status: AC
  Administered 2017-02-03: 200 ug via SUBCUTANEOUS
  Filled 2017-02-03: qty 0.4

## 2017-02-03 NOTE — Progress Notes (Signed)
[name]   DOB:01/11/1933   JM#:[id number]    Subjective: She feels good today.  The best she has felt for a long time.  She denies nausea.  Objective:  Vitals:   02/02/17 2201 02/03/17 0600  BP: (!) 165/54 (!) 170/66  Pulse: 80 65  Resp: 20 18  Temp: 99.5 F (37.5 C) 98.2 F (36.8 C)     Intake/Output Summary (Last 24 hours) at 02/03/17 2229 Last data filed at 02/03/17 0602  Gross per 24 hour  Intake                0 ml  Output              525 ml  Net             -525 ml    GENERAL:alert, no distress and comfortable SKIN: skin color, texture, turgor are normal, no rashes or significant lesions EYES: normal, Conjunctiva are pink and non-injected, sclera clear Musculoskeletal:no cyanosis of digits and no clubbing  NEURO: alert & oriented x 3 with fluent speech, no focal motor/sensory deficits   Labs:  Lab Results  Component Value Date   WBC 7.0 02/03/2017   HGB 9.6 (L) 02/03/2017   HCT 29.4 (L) 02/03/2017   MCV 91.3 02/03/2017   PLT 144 (L) 02/03/2017   NEUTROABS 5.0 02/03/2017    Lab Results  Component Value Date   NA 138 02/03/2017   K 3.1 (L) 02/03/2017   CL 101 02/03/2017   CO2 29 02/03/2017    Studies:  Ct Renal Stone Study  Result Date: 02/01/2017 CLINICAL DATA:  Nausea and vomiting for 3 days EXAM: CT ABDOMEN AND PELVIS WITHOUT CONTRAST TECHNIQUE: Multidetector CT imaging of the abdomen and pelvis was performed following the standard protocol without IV contrast. COMPARISON:  Plain film from earlier in the same day FINDINGS: Lower chest: Bilateral small pleural effusions are noted. Mild bibasilar consolidation is noted as well. Some changes of bronchiectasis are seen. Hepatobiliary: No focal liver abnormality is seen. No gallstones, gallbladder wall thickening, or biliary dilatation. Pancreas: Unremarkable. No pancreatic ductal dilatation or surrounding inflammatory changes. Spleen: Normal in size without focal abnormality. Adrenals/Urinary Tract:  Adrenal glands are unremarkable. Kidneys are normal, without renal calculi, focal lesion, or hydronephrosis. Bladder is well distended. Some air is noted within likely related to recent instrumentation. Stomach/Bowel: Large hiatal hernia is noted with almost the entire stomach within the chest cavity. A mild degree of organo-axial volvulus is noted as well combined with hiatal hernia. No inflammatory or obstructive changes are noted. Vascular/Lymphatic: Significant aortic calcifications are noted without aneurysmal dilatation. No definitive lymphadenopathy is seen. Reproductive: Status post hysterectomy. No adnexal masses. Other: No abdominal wall hernia or abnormality. No abdominopelvic ascites. Musculoskeletal: Prior right hip replacement is noted. Degenerative changes of lumbar spine are seen. IMPRESSION: Bilateral small pleural effusions with bibasilar consolidation. Large hiatal hernia with a component of organo-axial volvulus of the stomach. Air within the bladder likely related to recent instrumentation. Electronically Signed   By: Inez Catalina M.D.   On: 02/01/2017 10:52    Assessment & Plan:   Abnormal CBC Repeat CBC showed anemia and mild thrombocytopenia She has anemia chronic renal failure Since she is symptomatic, I will start darbepoetin injection today, provided systolic blood pressure is under 160 The low platelet count could be related to recent infection. She is not symptomatic. Observe only.  Severe intractable nausea and vomiting, improved Possible volvulus Continue conservative management. Symptoms have dramatically  improved I recommended Reglan.  I suspect her nausea could be related to gastric dysmotility related to diabetes/gastroparesis There may be also a component of reflux so I am also starting her on Prilosec  History of CHF Chronic renal failure stage III to IV Would defer to primary service for medical management Echocardiogram show preserved ejection fraction  but with diastolic dysfunction  Significant hypertension Resume blood pressure medications, I added hydralazine We will hold darbepoetin injection if systolic blood pressure is greater than 160  Poor appetite and hypokalemia We will give her general diet as tolerated Replace electrolyte imbalance as needed  Diabetes Sliding scale insulin  Discharge planning Defer to primary service I will return tomorrow to check on the patient  Heath Lark, MD 02/03/2017  9:27 AM

## 2017-02-03 NOTE — Clinical Social Work Note (Signed)
Clinical Social Work Assessment  Patient Details  Name: [name] MRN: [id number] Date of Birth: Nov 12, 1932  Date of referral:  02/03/17               Reason for consult:  Facility Placement, Discharge Planning                Permission sought to share information with:  Facility Art therapist granted to share information::  Yes, Verbal Permission Granted  Name::        Agency::     Relationship::     Contact Information:     Housing/Transportation Living arrangements for the past 2 months:  Apartment Source of Information:  Patient, Adult Children Patient Interpreter Needed:  None Criminal Activity/Legal Involvement Pertinent to Current Situation/Hospitalization:  No - Comment as needed Significant Relationships:  Adult Children Lives with:  Self Do you feel safe going back to the place where you live?   (SNF recommended.) Need for family participation in patient care:  Yes (Comment)  Care giving concerns:  Pt requires more care than is available at home following hospital d/c.   Social Worker assessment / plan:  Pt hospitalized from home on 01/30/17 with Diabetes mellitus with neurological manifestation. PT has recommended SNF at d/c. CSW met with pt / daughter to review PT recommendations and assist with d/c planning. Pt / daughter are in agreement with plan for ST Rehab and have requested [facility]. SNF search has been initiated and bed offers provided. [facility] has been contacted and placement decision is pending. CSW will continue to follow to assist with d/c planning needs.  Employment status:  Retired Nurse, adult PT Recommendations:  [place] / Referral to community resources:  [place]  Patient/Family's Response to care:  Pt / daughter feel ST Rehab is needed.  Patient/Family's Understanding of and Emotional Response to Diagnosis, Current Treatment, and Prognosis:   Pt / daughter are aware of pt's medical status. Pt is motivated to work with therapy. Pt / daughter appreciate CSW 's assistance with d/c planning.  Emotional Assessment Appearance:  Appears stated age Attitude/Demeanor/Rapport:  Other (cooperative) Affect (typically observed):  Calm, Appropriate, Pleasant Orientation:  Oriented to Self, Oriented to Place, Oriented to  Time, Oriented to Situation Alcohol / Substance use:  Not Applicable Psych involvement (Current and /or in the community):  No (Comment)  Discharge Needs  Concerns to be addressed:  Discharge Planning Concerns Readmission within the last 30 days:  No Current discharge risk:  None Barriers to Discharge:  No Barriers Identified   Luretha Rued, [place] 02/03/2017, 1:43 PM

## 2017-02-03 NOTE — Progress Notes (Signed)
Report given to Clois Dupes, RN at [facility]. Pt will be transported via private car by pt daughter.

## 2017-02-03 NOTE — Discharge Summary (Signed)
Physician Discharge Summary  [name] GHW:[id number] DOB: Mar 20, 1933 DOA: 01/30/2017  PCP: Jinny Sanders, MD  Admit date: 01/30/2017 Discharge date: 02/03/2017  Admitted From: Home Disposition:  SNF  Recommendations for Outpatient Follow-up:  1. Follow up with PCP in 1-2 weeks 2. Follow up with Dr. Heath Lark in Hematology in 1 month 3. Follow up with Dr. Kieth Brightly in Surgery in 1 month 4. Follow up with Neurology as an outpatient within 3 weeks 5. Please obtain CMP/CBC in one week  Home Health: No Equipment/Devices: None  Discharge Condition: Stable and Improved CODE STATUS: FULL CODE Diet recommendation: Heart Healthy / Carb Modified  Brief/Interim Summary: [name] a 83 y.o.femalewith medical history significant of coronary artery disease status post CABG, type 2diabetes mellitus, hypertension, hypothyroidism, hyperlipidemia, presents to the emergency room with a chief complaint of intractable nausea and vomiting for the past 3 days. Patient was diagnosed with a urinary tract infection at an urgent care 3 days ago when she was given Bactrim. After the first dose of Bactrim, she has had significant nausea and vomiting and inability for any p.o. intake that lasted the whole day. She usually gets nausea vomiting urinary tract infections, however this time it was much more severe. As far as her presenting symptom for UTI was increased frequency. She denies any abdominal pain, she denies any chest pain or shortness of breath. She was feeling quite crummy because of ongoing nausea. Over the last 3 days, she was unable to take any of her p.o. medications at home. At baseline she is highly independent, she is able to perform all her ADLs and is still driving.   She was admitted and treated for her intractable Nausea and Vomiting and also found to have an Acute Decompensation of Diastolic CHF. Patient was diuresed with IV lasix and steadily improved. Nausea resolved and  she was evaluated by Hematology who will administer an Aranesp injection. Patient was evaluated by PT who recommended SNF. At this time patient was deemed medically stable and she will be D/C'd to SNF and be placed on po Lasix and continue Abx for her UTI that was poA.   Discharge Diagnoses:  Active Problems:   Diabetes mellitus with neurological manifestation (HCC)   Hypothyroidism   Essential hypertension   CAD (coronary artery disease)   Hyperlipidemia LDL goal <70   Right bundle branch block   Chronic kidney disease (CKD), stage III (moderate)   Deficiency anemia   Dysuria   Intractable nausea and vomiting   UTI (urinary tract infection)  Intractable nausea and vomiting, improved but not resolved -Patient was still nauseous this AM; Has poor appetite  -From uti, meds side effect?  -CT concerning for "Large hiatal hernia with a component of organo-axial volvulus of the stomach" -Case discussed with [facility] who recommended general surgery consult, -Case discussed the case with general surgery Dr Kieth Brightly who recommend watchful waiting now that her symptom seem has resolved and tolerating liquid diet, should her symptom return will need to call general surgery for formal consult, otherwise -Outpatient follow up with Dr Kieth Brightly -Advance diet as tolerated. avoidIVF due to concerning of CHF -Diet advanced to Regular Diet after discussion with Primary Oncologist Dr. Alvy Vaughan. And Reglan Started.  -C/w Ondansetron 4 mg po/IV q6hprn and with Promethazine 6.25 mg IV q6hprn for N/V -Metoclopramide 5 mg po TID before meals started by Oncology as well as Pantoprazole 20 mg po BID  Urinary Tract Infection, poA -Patient haddysuria and presented to [facility]  urgent care on [address] [phone]) three days ago, Patient diagnosed with a UTI and started on Bactrim, however she developed n/v, not able to keep food or meds down . -[facility]  -IV  antibiotics of Ceftriaxone changed to po Vantin; D/C'd Bactrim -Urine culture from urgent care clinic">100,000 colony forming untis per cc, but nondominant" [facility] EDno growth -Urine Cx from [facility] showed no Growth likely as she was on Abx  Hypokalemia -Patient's K+ was 3.1 this AM -Replete with Potassium Chloride 40 mEQ po BID -Continue to Monitor and Replete K+ As Necessary -Repeat CMP at SNF  Hypomagnesemia -Patient's Mag Level this AM was 1.5 -Replete with 2 grams of IV Mag Sulfate -Repeat Mag Level at SNF  Acute on chronic diastolic CHF -Most recent 2D echo was in 2016 and showed normal ejection fraction of 60-65% and grade 2 diastolic dysfunction. Repeat ECHO 5/9 showed EF of 60-65% and Grade 2 Diastolic Dysfunction.  -She has mild evidence of fluid overload with 1+ bilateral lower edema, bibasilar pleural effusions and mild cardiac enlargement on admission. - Denies chest pain or sob, but daughter report patient has had 20lbs weight gain recently -D/C'd ivf, d/c maxzide,   -Changed IV Furosemide 40 mg IV Daily to po Lasix 20 mg daily; Deferring to Physician at SNF to titrate up -Strict I's and O's and Daily Weights -Patient is Positive +666.3 mL by I's and O's have not been accurate. Patient has lost 9 pounds since admisssion  Hypertension -Now that she is tolerating po c/w Amlodipine 5 mg po Daily and Metoprolol-XL 25 mg po BID -D/C'd Triamterene-HCTZ; Started Lasix 20 mg po Daily  Coronary artery disease status post CABG -She denies any chest pain, she has slight troponin elevation 0.08 which is likely demand ischemia.  -Continue ASA 81 mg, Atorvastatin 80 mg po Daily, Metoprolol Succinate 25 mg po BID  Noninsulin dependent Diabetes mellitus -Resume home oral agents,  -Will D/C Actos at discharge due to CHF. -CBG's ranging from 103-206  Chronic kidney disease stage III -Her creatinine currently is at baseline at 1.39 -Repeat CMP  in AM  Anemia of chronic disease:  -Monitor Hb; Hb/Hct went from 8.5/27.2 -> 9.6./29.4 -She is followed by hematology for aranesp injection. -Dr. Alvy Vaughan planning on giving patient Darbepoetin Alfa 200 mcg today now that her BP is better controlled -Follow up with Dr. Alvy Vaughan in 1 month.  Hyperlipidemia -C/w Home Atorvastatin 80 mg po Daily  Hypothyroidism -C/w Home Levothyroxine 50 mcg po Daily before Breakfast  Depression -C/w Home Sertraline 25 mg po Daily  FTT -Family is concerned she has some memory deficit, and intermittent confusion,  -Outpatient neurology for evaluation  -RN report patient need two person assist to get out of bet to bedside commode, daughter agreeable to SNF placement if indicated,  -PT eval done and recommended SNF  Discharge Instructions  Discharge Instructions    (HEART FAILURE PATIENTS) Call MD:  Anytime you have any of the following symptoms: 1) 3 pound weight gain in 24 hours or 5 pounds in 1 week 2) shortness of breath, with or without a dry hacking cough 3) swelling in the hands, feet or stomach 4) if you have to sleep on extra pillows at night in order to breathe.    Complete by:  As directed    Call MD for:  difficulty breathing, headache or visual disturbances    Complete by:  As directed  Call MD for:  extreme fatigue    Complete by:  As directed    Call MD for:  hives    Complete by:  As directed    Call MD for:  persistant dizziness or light-headedness    Complete by:  As directed    Call MD for:  persistant nausea and vomiting    Complete by:  As directed    Call MD for:  redness, tenderness, or signs of infection (pain, swelling, redness, odor or green/yellow discharge around incision site)    Complete by:  As directed    Call MD for:  severe uncontrolled pain    Complete by:  As directed    Call MD for:  temperature >100.4    Complete by:  As directed    Diet - low sodium heart healthy    Complete by:  As directed     Diet Carb Modified    Complete by:  As directed    Discharge instructions    Complete by:  As directed    Follow up Care at SNF. Take all medications as prescribed.   Increase activity slowly    Complete by:  As directed      Allergies as of 02/03/2017      Reactions   Levofloxacin Nausea And Vomiting   Morphine And Related Other (See Comments)   BAD HEADACHE   Tape Rash      Medication List    STOP taking these medications   triamterene-hydrochlorothiazide 37.5-25 MG capsule Commonly known as:  DYAZIDE     TAKE these medications   ACCU-CHEK AVIVA device Use to check blood sugar once daily.  Dx: E11.43   accu-chek softclix lancets Use to check blood sugar once daily.  Dx: E11.43   amLODipine 5 MG tablet Commonly known as:  NORVASC Take 1 tablet (5 mg total) by mouth daily. Start taking on:  02/04/2017   aspirin 81 MG tablet Take 81 mg by mouth at bedtime.   atorvastatin 80 MG tablet Commonly known as:  LIPITOR Take 1 tablet (80 mg total) by mouth daily.   cefpodoxime 200 MG tablet Commonly known as:  VANTIN Take 1 tablet (200 mg total) by mouth every 12 (twelve) hours. Start taking on:  02/04/2017   furosemide 20 MG tablet Commonly known as:  LASIX Take 1 tablet (20 mg total) by mouth daily.   gabapentin 100 MG capsule Commonly known as:  NEURONTIN Take 2 capsules (200 mg total) by mouth at bedtime. Can increase to 300 mg at bedtime if still having neuropathy. What changed:  how much to take  additional instructions   glipiZIDE 5 MG 24 hr tablet Commonly known as:  GLUCOTROL XL TAKE 1 TABLET (5 MG TOTAL) BY MOUTH DAILY WITH BREAKFAST.   glucose blood test strip Commonly known as:  ACCU-CHEK AVIVA Use to check blood sugar once daily.  Dx: E11.43   IRON PO Take 1 tablet by mouth.   levothyroxine 50 MCG tablet Commonly known as:  SYNTHROID, LEVOTHROID TAKE 1 TABLET BY MOUTH EVERY DAY   metoCLOPramide 5 MG tablet Commonly known as:  REGLAN Take 1  tablet (5 mg total) by mouth 3 (three) times daily before meals.   metoprolol succinate 25 MG 24 hr tablet Commonly known as:  TOPROL-XL Take 25 mg by mouth 2 (two) times daily. What changed:  Another medication with the same name was removed. Continue taking this medication, and follow the directions you see here.  nitroGLYCERIN 0.4 MG SL tablet Commonly known as:  NITROSTAT Place 1 tablet (0.4 mg total) under the tongue every 5 (five) minutes as needed for chest pain (x 3 tabs daily).   pantoprazole 20 MG tablet Commonly known as:  PROTONIX Take 1 tablet (20 mg total) by mouth 2 (two) times daily.   pioglitazone 30 MG tablet Commonly known as:  ACTOS TAKE 0.5 TABLETS (15 MG TOTAL) BY MOUTH DAILY.   sertraline 25 MG tablet Commonly known as:  ZOLOFT TAKE 1 TABLET (25 MG TOTAL) BY MOUTH DAILY.   VITAMIN B-12 CR PO Take 1 tablet by mouth daily.   Vitamin D (Ergocalciferol) 50000 units Caps capsule Commonly known as:  DRISDOL TAKE 1 CAPSULE BY MOUTH EVERY 7 DAYS.      Follow-up Information    [facility] Follow up in 3 week(s).   Why:  for evaluation of possible memory loss Contact information: [address]     [place] [address] [phone]       Kinsinger, Arta Bruce, MD Follow up in 1 month(s).   Specialty:  General Surgery Why:  for larger hiatal hernia Contact information: [address] [place] [address] [phone]          Allergies  Allergen Reactions  . Levofloxacin Nausea And Vomiting  . Morphine And Related Other (See Comments)    BAD HEADACHE  . Tape Rash   Consultations:  Hematology Dr. Heath Lark  General Surgery Dr. Kieth Brightly via Phone Consultation  [facility] via Phone Consultation  Procedures/Studies: Dg Chest 2 View  Result Date: 01/30/2017 CLINICAL DATA:  Cough and recurrent vomiting. Patient is taking antibiotics for urinary tract infection. EXAM: CHEST  2 VIEW  COMPARISON:  05/15/2016 FINDINGS: Small bilateral pleural effusions, greater on the left. Mild cardiac enlargement. No vascular congestion. No pneumothorax. Postoperative changes in the mediastinum. Degenerative changes in the spine and shoulders. Calcified and tortuous aorta. IMPRESSION: Bilateral pleural effusions, greater on the left. Mild cardiac enlargement. No focal consolidation in the lungs. Electronically Signed   By: Lucienne Capers M.D.   On: 01/30/2017 23:34   Dg Abd 1 View  Result Date: 01/30/2017 CLINICAL DATA:  Urinary tract infection this past Friday, placed on Bactrim. Nausea and vomiting since antibiotics. EXAM: ABDOMEN - 1 VIEW COMPARISON:  CT pelvis 05/14/2016.  Abdomen 09/18/2015 FINDINGS: Scattered gas and stool throughout the colon. No small or large bowel distention. No radiopaque stones. Surgical clips in the mid abdomen and pelvis. Vascular calcifications. Postoperative changes in the right hip. Thoracolumbar scoliosis convex towards the left with degenerative changes in the spine and left hip. IMPRESSION: Nonobstructive bowel gas pattern. Electronically Signed   By: Lucienne Capers M.D.   On: 01/30/2017 23:33   Ct Renal Stone Study  Result Date: 02/01/2017 CLINICAL DATA:  Nausea and vomiting for 3 days EXAM: CT ABDOMEN AND PELVIS WITHOUT CONTRAST TECHNIQUE: Multidetector CT imaging of the abdomen and pelvis was performed following the standard protocol without IV contrast. COMPARISON:  Plain film from earlier in the same day FINDINGS: Lower chest: Bilateral small pleural effusions are noted. Mild bibasilar consolidation is noted as well. Some changes of bronchiectasis are seen. Hepatobiliary: No focal liver abnormality is seen. No gallstones, gallbladder wall thickening, or biliary dilatation. Pancreas: Unremarkable. No pancreatic ductal dilatation or surrounding inflammatory changes. Spleen: Normal in size without focal abnormality. Adrenals/Urinary Tract: Adrenal glands are  unremarkable. Kidneys are normal, without renal calculi, focal lesion, or hydronephrosis.  Bladder is well distended. Some air is noted within likely related to recent instrumentation. Stomach/Bowel: Large hiatal hernia is noted with almost the entire stomach within the chest cavity. A mild degree of organo-axial volvulus is noted as well combined with hiatal hernia. No inflammatory or obstructive changes are noted. Vascular/Lymphatic: Significant aortic calcifications are noted without aneurysmal dilatation. No definitive lymphadenopathy is seen. Reproductive: Status post hysterectomy. No adnexal masses. Other: No abdominal wall hernia or abnormality. No abdominopelvic ascites. Musculoskeletal: Prior right hip replacement is noted. Degenerative changes of lumbar spine are seen. IMPRESSION: Bilateral small pleural effusions with bibasilar consolidation. Large hiatal hernia with a component of organo-axial volvulus of the stomach. Air within the bladder likely related to recent instrumentation. Electronically Signed   By: Inez Catalina M.D.   On: 02/01/2017 10:52    ECHOCARDIOGRAM  - Left ventricle: The cavity size was normal. Wall thickness was   normal. Systolic function was vigorous. The estimated ejection   fraction was in the range of 65% to 70%. Wall motion was normal;   there were no regional wall motion abnormalities. Features are   consistent with a pseudonormal left ventricular filling pattern,   with concomitant abnormal relaxation and increased filling   pressure (grade 2 diastolic dysfunction). - Aortic valve: Mildly calcified annulus. Valve area (VTI): 2.2   cm^2. Valve area (Vmax): 2.2 cm^2. Valve area (Vmean): 2.13 cm^2. - Mitral valve: Mildly to moderately calcified annulus. - Right atrium: The atrium was mildly dilated.  Subjective: Seen and examined at bedside and was feeling tremendously better. Had no Nausea or Vomiting. No CP or SOB. Hopeful she can progress faster with physical  therapy. No other concerns or complaints and ready to be D/C'd to SNF.   Discharge Exam: Vitals:   02/03/17 0600 02/03/17 1238  BP: (!) 170/66 (!) 151/52  Pulse: 65 61  Resp: 18 20  Temp: 98.2 F (36.8 C) 97.6 F (36.4 C)   Vitals:   02/02/17 1341 02/02/17 2201 02/03/17 0600 02/03/17 1238  BP: (!) 172/63 (!) 165/54 (!) 170/66 (!) 151/52  Pulse: 71 80 65 61  Resp: 18 20 18 20   Temp: 98.6 F (37 C) 99.5 F (37.5 C) 98.2 F (36.8 C) 97.6 F (36.4 C)  TempSrc: Oral Oral Oral Oral  SpO2: 96% 95% 98% 97%  Weight:   86.7 kg (191 lb 2.2 oz)   Height:       General: Pt is alert, awake, not in acute distress Cardiovascular: RRR, S1/S2 +, no rubs, no gallops Respiratory: CTA bilaterally, no wheezing, no rhonchi; Patient not tachypenic or using any accessory muscles to breathe. Abdominal: Soft, NT, ND, bowel sounds + Extremities: Very mild leg edema, no cyanosis  The results of significant diagnostics from this hospitalization (including imaging, microbiology, ancillary and laboratory) are listed below for reference.    Microbiology: Recent Results (from the past 240 hour(s))  Urine culture     Status: None   Collection Time: 01/31/17 12:34 AM  Result Value Ref Range Status   Specimen Description URINE, CATHETERIZED  Final   Special Requests NONE  Final   Culture   Final    NO GROWTH Performed at [facility], [address], [place], [place] [address]    Report Status 02/01/2017 FINAL  Final    Labs: BNP (last 3 results)  Recent Labs  01/30/17 1911  BNP 053.9*   Basic Metabolic Panel:  Recent Labs Lab 01/30/17 1900 02/01/17 0622 02/02/17 0525 02/03/17 0532  NA 139 141 140 138  K 3.2* 3.3* 3.5 3.1*  CL 102 108 106 101  CO2 25 23 24 29   GLUCOSE 180* 104* 97 147*  BUN 14 16 16 19   CREATININE 1.30* 1.30* 1.37* 1.39*  CALCIUM 9.0 8.5* 8.3* 8.4*  MG  --   --   --  1.5*  PHOS  --   --   --  3.4   Liver Function Tests:  Recent Labs Lab 01/30/17 1900  02/03/17 0532  AST 21 15  ALT 12* 12*  ALKPHOS 88 75  BILITOT 0.6 0.7  PROT 6.6 6.0*  ALBUMIN 3.4* 3.0*    Recent Labs Lab 01/30/17 1900  LIPASE 19   No results for input(s): AMMONIA in the last 168 hours. CBC:  Recent Labs Lab 01/30/17 1900 02/01/17 0622 02/02/17 0525 02/03/17 0532  WBC 8.5 6.4 6.5 7.0  NEUTROABS 7.3  --  4.8 5.0  HGB 9.3* 13.2 8.5* 9.6*  HCT 28.7* 41.1 27.2* 29.4*  MCV 93.8 92.4 93.2 91.3  PLT 169 114* 142* 144*   Cardiac Enzymes:  Recent Labs Lab 01/31/17 1410 01/31/17 1958 02/02/17 0849 02/02/17 1508 02/02/17 1955  TROPONINI 0.05* 0.09* 0.08* 0.08* 0.08*   BNP: Invalid input(s): POCBNP CBG:  Recent Labs Lab 02/02/17 1140 02/02/17 1706 02/02/17 2151 02/03/17 0740 02/03/17 1155  GLUCAP 108* 103* 202* 137* 206*   D-Dimer No results for input(s): DDIMER in the last 72 hours. Hgb A1c No results for input(s): HGBA1C in the last 72 hours. Lipid Profile No results for input(s): CHOL, HDL, LDLCALC, TRIG, CHOLHDL, LDLDIRECT in the last 72 hours. Thyroid function studies No results for input(s): TSH, T4TOTAL, T3FREE, THYROIDAB in the last 72 hours.  Invalid input(s): FREET3 Anemia work up No results for input(s): VITAMINB12, FOLATE, FERRITIN, TIBC, IRON, RETICCTPCT in the last 72 hours. Urinalysis    Component Value Date/Time   COLORURINE YELLOW 01/31/2017 0034   APPEARANCEUR CLOUDY (A) 01/31/2017 0034   LABSPEC 1.022 01/31/2017 0034   LABSPEC 1.015 08/12/2016 1115   PHURINE 6.0 01/31/2017 0034   GLUCOSEU 100 (A) 01/31/2017 0034   GLUCOSEU Negative 08/12/2016 1115   HGBUR MODERATE (A) 01/31/2017 0034   BILIRUBINUR NEGATIVE 01/31/2017 0034   BILIRUBINUR Negative 08/12/2016 1115   KETONESUR 15 (A) 01/31/2017 0034   PROTEINUR >300 (A) 01/31/2017 0034   UROBILINOGEN 0.2 08/12/2016 1115   NITRITE NEGATIVE 01/31/2017 0034   LEUKOCYTESUR NEGATIVE 01/31/2017 0034   LEUKOCYTESUR Moderate 08/12/2016 1115   Sepsis Labs Invalid  input(s): PROCALCITONIN,  WBC,  LACTICIDVEN Microbiology Recent Results (from the past 240 hour(s))  Urine culture     Status: None   Collection Time: 01/31/17 12:34 AM  Result Value Ref Range Status   Specimen Description URINE, CATHETERIZED  Final   Special Requests NONE  Final   Culture   Final    NO GROWTH Performed at [facility], [place] [address], [place], [place] [address]    Report Status 02/01/2017 FINAL  Final   Time coordinating discharge: 35 minutes  SIGNED:  Kerney Elbe, DO [facility] 02/03/2017, 2:29 PM Pager [phone]  If 7PM-7AM, please contact night-coverage [email or web address] Password [email or web address]

## 2017-02-03 NOTE — Care Management Note (Signed)
Case Management Note  Patient Details  Name: [name] MRN: [id number] Date of Birth: April 21, 1933  Subjective/Objective: PT recc SNF-CSW already following.                   Action/Plan:d/c plan SNF.   Expected Discharge Date:                  Expected Discharge Plan:  Skilled Nursing Facility  In-House Referral:  Clinical Social Work  Discharge planning Services  CM Consult  Post Acute Care Choice:    Choice offered to:     DME Arranged:    DME Agency:     HH Arranged:    [place] Agency:     Status of Service:  Completed, signed off  If discussed at [employer], dates discussed:    Additional Comments:  Dessa Phi, RN 02/03/2017, 12:00 PM

## 2017-02-03 NOTE — NC FL2 (Signed)
[place] LEVEL OF CARE SCREENING TOOL     IDENTIFICATION  Patient Name: [name] Birthdate: 11/27/1932 Sex: female Admission Date (Current Location): 01/30/2017  [facility] [place] Number:  Herbalist and Address:  [facility],  [address], [place]      Provider Number: [id number]  Attending Physician Name and Address:  Kerney Elbe, DO  Relative Name and Phone Number:       Current Level of Care: Hospital Recommended Level of Care: [place] Prior Approval Number:    Date Approved/Denied:   PASRR Number: [id number]  Discharge Plan: SNF    Current Diagnoses: Patient Active Problem List   Diagnosis Date Noted  . Intractable nausea and vomiting 01/31/2017  . UTI (urinary tract infection) 01/31/2017  . Physical debility 06/17/2016  . Deficiency anemia 06/02/2016  . Dysuria 06/02/2016  . Fluid overload 06/02/2016  . Lung crackles 06/02/2016  . Chronic kidney disease (CKD), stage III (moderate) 05/15/2016  . History of hip fracture 05/14/2016  . Right bundle branch block 11/19/2015  . History of breast cancer 10/02/2015  . History of uterine cancer 10/02/2015  . Anemia due to chronic renal failure treated with erythropoietin 10/02/2015  . Carotid stenosis 09/26/2015  . Acute kidney injury superimposed on chronic kidney disease ([place])   . Hyperlipidemia LDL goal <70   . Depression, major, single episode, mild ([place]) 10/22/2014  . Diabetic peripheral autonomic neuropathy ([place]) 12/04/2013  . Vitamin D deficiency 08/03/2012  . Diabetes mellitus with neurological manifestation ([place]) 01/26/2011  . Hypothyroidism 01/26/2011  . Essential hypertension 01/26/2011  . CAD (coronary artery disease) 01/26/2011    Orientation RESPIRATION BLADDER Height & Weight     Self, Time, Situation, Place  Normal Continent Weight: 191 lb 2.2 oz (86.7 kg) Height:  5\' 3"  (160 cm)  BEHAVIORAL SYMPTOMS/MOOD  NEUROLOGICAL BOWEL NUTRITION STATUS  Other (Comment) (no behaviors)   Continent Diet  AMBULATORY STATUS COMMUNICATION OF NEEDS Skin   Limited Assist Verbally Normal                       Personal Care Assistance Level of Assistance  Bathing, Feeding, Dressing Bathing Assistance: Limited assistance Feeding assistance: Independent Dressing Assistance: Limited assistance     Functional Limitations Info  Sight, Hearing, Speech Sight Info: Adequate Hearing Info: Adequate Speech Info: Adequate    SPECIAL CARE FACTORS FREQUENCY  PT (By licensed PT), OT (By licensed OT)     PT Frequency: 5x wk OT Frequency: 5x wk            Contractures Contractures Info: Present    Additional Factors Info  Allergies   Allergies Info: LEVOFLOXACIN, MORPHINE AND RELATED           Current Medications (02/03/2017):  This is the current hospital active medication list Current Facility-Administered Medications  Medication Dose Route Frequency Provider Last Rate Last Dose  . acetaminophen (TYLENOL) tablet 650 mg  650 mg Oral Q6H PRN Caren Griffins, MD       Or  . acetaminophen (TYLENOL) suppository 650 mg  650 mg Rectal Q6H PRN Caren Griffins, MD      . amLODipine (NORVASC) tablet 5 mg  5 mg Oral Daily Alvy Bimler, Ni, MD   5 mg at 02/03/17 0801  . aspirin EC tablet 81 mg  81 mg Oral Daily Caren Griffins, MD   81 mg at 02/03/17 0801  . atorvastatin (LIPITOR)  tablet 80 mg  80 mg Oral Daily Caren Griffins, MD   80 mg at 02/03/17 0801  . cefTRIAXone (ROCEPHIN) 1 g in dextrose 5 % 50 mL IVPB  1 g Intravenous Q24H Caren Griffins, MD   Stopped at 02/03/17 0818  . Darbepoetin Alfa (ARANESP) injection 200 mcg  200 mcg Subcutaneous Once Heath Lark, MD   Stopped at 02/02/17 2213  . enoxaparin (LOVENOX) injection 40 mg  40 mg Subcutaneous Q24H Marzetta Board M, MD   40 mg at 02/03/17 0800  . furosemide (LASIX) injection 40 mg  40 mg Intravenous Daily Florencia Reasons, MD   40 mg at 02/02/17 1212   . gabapentin (NEURONTIN) capsule 200 mg  200 mg Oral QHS Caren Griffins, MD   200 mg at 02/02/17 2214  . hydrALAZINE (APRESOLINE) injection 10 mg  10 mg Intravenous Q6H PRN Caren Griffins, MD   10 mg at 02/01/17 2116  . hydrALAZINE (APRESOLINE) tablet 10 mg  10 mg Oral Q8H Gorsuch, Ni, MD   10 mg at 02/03/17 0752  . insulin aspart (novoLOG) injection 0-9 Units  0-9 Units Subcutaneous TID WC Caren Griffins, MD   1 Units at 02/03/17 0757  . levothyroxine (SYNTHROID, LEVOTHROID) tablet 50 mcg  50 mcg Oral QAC breakfast Caren Griffins, MD   50 mcg at 02/03/17 0749  . metoCLOPramide (REGLAN) tablet 5 mg  5 mg Oral TID AC Gorsuch, Ni, MD   5 mg at 02/03/17 0749  . metoprolol (LOPRESSOR) tablet 25 mg  25 mg Oral Once Gibbons, Claudia J, PA-C      . metoprolol succinate (TOPROL-XL) 24 hr tablet 25 mg  25 mg Oral BID Caren Griffins, MD   25 mg at 02/03/17 0801  . ondansetron (ZOFRAN) tablet 4 mg  4 mg Oral Q6H PRN Caren Griffins, MD       Or  . ondansetron (ZOFRAN) injection 4 mg  4 mg Intravenous Q6H PRN Caren Griffins, MD   4 mg at 02/02/17 0824  . pantoprazole (PROTONIX) EC tablet 20 mg  20 mg Oral BID Heath Lark, MD   20 mg at 02/03/17 0801  . potassium chloride SA (K-DUR,KLOR-CON) CR tablet 40 mEq  40 mEq Oral BID Raiford Noble Waggaman, DO   40 mEq at 02/03/17 0845  . promethazine (PHENERGAN) injection 6.25 mg  6.25 mg Intravenous Q6H PRN Caren Griffins, MD   6.25 mg at 02/02/17 1843  . sertraline (ZOLOFT) tablet 25 mg  25 mg Oral Daily Caren Griffins, MD   25 mg at 02/03/17 0800  . sodium chloride flush (NS) 0.9 % injection 3 mL  3 mL Intravenous Q12H Caren Griffins, MD   3 mL at 02/03/17 1000     Discharge Medications: Please see discharge summary for a list of discharge medications.  Relevant Imaging Results:  Relevant Lab Results:   Additional Information SSN: [id number]  Dena Esperanza, Randall An, LCSW

## 2017-02-03 NOTE — Clinical Social Work Placement (Signed)
   CLINICAL SOCIAL WORK PLACEMENT  NOTE  Date:  02/03/2017  Patient Details  Name: [name] MRN: [id number] Date of Birth: 09-12-1933  Clinical Social Work is seeking post-discharge placement for this patient at the [place] level of care (*CSW will initial, date and re-position this form in  chart as items are completed):  Yes   Patient/family provided with [place] Work Department's list of facilities offering this level of care within the geographic area requested by the patient (or if unable, by the patient's family).  Yes   Patient/family informed of their freedom to choose among providers that offer the needed level of care, that participate in Medicare, Medicaid or managed care program needed by the patient, have an available bed and are willing to accept the patient.  Yes   Patient/family informed of [facility]'s ownership interest in [facility] and [facility], as well as of the fact that they are under no obligation to receive care at these facilities.  PASRR submitted to EDS on 02/03/17     PASRR number received on 02/03/17     Existing PASRR number confirmed on       FL2 transmitted to all facilities in geographic area requested by pt/family on 02/03/17     FL2 transmitted to all facilities within larger geographic area on       Patient informed that his/her managed care company has contracts with or will negotiate with certain facilities, including the following:        Yes   Patient/family informed of bed offers received.  Patient chooses bed at [facility]     Physician recommends and patient chooses bed at      Patient to be transferred to [facility] on 02/03/17.  Patient to be transferred to facility by [facility]     Patient family notified on 02/03/17 of transfer.  Name of family member notified:  Daughter     PHYSICIAN       Additional Comment: Pt / daughter are in agreement with d/c to [facility]  today. Daughter requesting to transport pt. D/C Summary sent to SNF for review. No scripts printed. # for report provided to nsg.   _______________________________________________ Luretha Rued, [place] 02/03/2017, 3:44 PM

## 2017-02-03 NOTE — Evaluation (Signed)
Physical Therapy Evaluation Patient Details Name: [name] MRN: [id number] DOB: 02-06-1933 Today's Date: 02/03/2017   History of Present Illness  83 yo female admitted with N/V. Hx of chronic anemia, HTN, breast/uterine cancer, R periprosthetic fx 04/2016  Clinical Impression  On eval, pt required Mod assist for mobility. She walked ~30 feet with a RW. Pt presents with general weakness, decreased activity tolerance, and impaired gait and balance. She is at risk for falls currently. Recommend ST rehab at SNF. Will follow and progress activity as tolerated.     Follow Up Recommendations SNF    Equipment Recommendations  None recommended by PT    Recommendations for Other Services       Precautions / Restrictions Precautions Precautions: Fall Restrictions Weight Bearing Restrictions: No      Mobility  Bed Mobility Overal bed mobility: Needs Assistance Bed Mobility: Supine to Sit     Supine to sit: Mod assist;HOB elevated     General bed mobility comments: Assist for trunk and to scoot to EOB. Increased time.   Transfers Overall transfer level: Needs assistance Equipment used: Rolling walker (2 wheeled) Transfers: Sit to/from [facility] Sit to Stand: Mod assist Stand pivot transfers: Min assist       General transfer comment: Assist to rise, stabilize, control descent VCS safety, technique, hand placement. Stand pivot, bed>bsc, with RW  Ambulation/Gait Ambulation/Gait assistance: Min assist Ambulation Distance (Feet): 30 Feet Assistive device: Rolling walker (2 wheeled) Gait Pattern/deviations: Step-through pattern;Decreased stride length     General Gait Details: slow gait speed. Assist to stabilize.   Stairs            Wheelchair Mobility    Modified Rankin (Stroke Patients Only)       Balance Overall balance assessment: Needs assistance;History of Falls           Standing balance-Leahy Scale: Poor                               Pertinent Vitals/Pain Pain Assessment: 0-10 Pain Score: 7  Pain Location: L upper arm Pain Descriptors / Indicators: Aching;Grimacing Pain Intervention(s): Limited activity within patient's tolerance;Repositioned    Home Living Family/patient expects to be discharged to:: Skilled nursing facility Living Arrangements: Alone Available Help at Discharge: Family;Available PRN/intermittently                  Prior Function Level of Independence: Independent with assistive device(s)         Comments: uses RW     Hand Dominance        Extremity/Trunk Assessment   Upper Extremity Assessment Upper Extremity Assessment: Generalized weakness    Lower Extremity Assessment Lower Extremity Assessment: Generalized weakness (leg length discrepancy)    Cervical / Trunk Assessment Cervical / Trunk Assessment: Normal  Communication   Communication: No difficulties  Cognition Arousal/Alertness: Awake/alert Behavior During Therapy: WFL for tasks assessed/performed Overall Cognitive Status: Within Functional Limits for tasks assessed                                        General Comments      Exercises     Assessment/Plan    PT Assessment Patient needs continued PT services  PT Problem List Decreased strength;Decreased mobility;Decreased activity tolerance;Decreased balance;Decreased knowledge of use of DME;Pain  PT Treatment Interventions DME instruction;Gait training;Therapeutic activities;Therapeutic exercise;Patient/family education;Balance training;Functional mobility training    PT Goals (Current goals can be found in the Care Plan section)  Acute Rehab PT Goals Patient Stated Goal: none stated PT Goal Formulation: With patient Time For Goal Achievement: 02/17/17 Potential to Achieve Goals: Good    Frequency Min 3X/week   Barriers to discharge        Co-evaluation               AM-PAC PT "6  Clicks" Daily Activity  Outcome Measure Difficulty turning over in bed (including adjusting bedclothes, sheets and blankets)?: A Lot Difficulty moving from lying on back to sitting on the side of the bed? : A Lot Difficulty sitting down on and standing up from a chair with arms (e.g., wheelchair, bedside commode, etc,.)?: A Lot Help needed moving to and from a bed to chair (including a wheelchair)?: A Little Help needed walking in hospital room?: A Little Help needed climbing 3-5 steps with a railing? : A Lot 6 Click Score: 14    End of Session Equipment Utilized During Treatment: Gait belt Activity Tolerance: Patient tolerated treatment well Patient left: in chair;with call bell/phone within reach;with chair alarm set   PT Visit Diagnosis: Muscle weakness (generalized) (M62.81);Difficulty in walking, not elsewhere classified (R26.2)    Time: 7169-6789 PT Time Calculation (min) (ACUTE ONLY): 13 min   Charges:   PT Evaluation $PT Eval Low Complexity: 1 Procedure     PT G Codes:          Weston Anna, MPT Pager: [phone]

## 2017-02-04 DIAGNOSIS — N390 Urinary tract infection, site not specified: Secondary | ICD-10-CM | POA: Diagnosis not present

## 2017-02-04 DIAGNOSIS — E785 Hyperlipidemia, unspecified: Secondary | ICD-10-CM | POA: Diagnosis not present

## 2017-02-04 DIAGNOSIS — I10 Essential (primary) hypertension: Secondary | ICD-10-CM | POA: Diagnosis not present

## 2017-02-04 DIAGNOSIS — I2510 Atherosclerotic heart disease of native coronary artery without angina pectoris: Secondary | ICD-10-CM | POA: Diagnosis not present

## 2017-02-04 DIAGNOSIS — R112 Nausea with vomiting, unspecified: Secondary | ICD-10-CM | POA: Diagnosis not present

## 2017-02-07 ENCOUNTER — Other Ambulatory Visit: Payer: Medicare Other

## 2017-02-07 ENCOUNTER — Ambulatory Visit: Payer: Medicare Other | Admitting: Hematology and Oncology

## 2017-02-07 ENCOUNTER — Ambulatory Visit: Payer: Medicare Other

## 2017-02-08 ENCOUNTER — Telehealth: Payer: Self-pay | Admitting: Hematology and Oncology

## 2017-02-08 NOTE — Telephone Encounter (Signed)
lvm to inform pt of 6/11 appt at 2 pm per sch msg

## 2017-02-11 DIAGNOSIS — K219 Gastro-esophageal reflux disease without esophagitis: Secondary | ICD-10-CM | POA: Diagnosis not present

## 2017-02-11 DIAGNOSIS — I10 Essential (primary) hypertension: Secondary | ICD-10-CM | POA: Diagnosis not present

## 2017-02-11 DIAGNOSIS — I2510 Atherosclerotic heart disease of native coronary artery without angina pectoris: Secondary | ICD-10-CM | POA: Diagnosis not present

## 2017-02-11 DIAGNOSIS — E1149 Type 2 diabetes mellitus with other diabetic neurological complication: Secondary | ICD-10-CM | POA: Diagnosis not present

## 2017-02-14 DIAGNOSIS — N390 Urinary tract infection, site not specified: Secondary | ICD-10-CM | POA: Diagnosis not present

## 2017-02-14 DIAGNOSIS — D649 Anemia, unspecified: Secondary | ICD-10-CM | POA: Diagnosis not present

## 2017-02-14 DIAGNOSIS — R079 Chest pain, unspecified: Secondary | ICD-10-CM | POA: Diagnosis not present

## 2017-02-14 DIAGNOSIS — I10 Essential (primary) hypertension: Secondary | ICD-10-CM | POA: Diagnosis not present

## 2017-02-15 DIAGNOSIS — B351 Tinea unguium: Secondary | ICD-10-CM | POA: Diagnosis not present

## 2017-02-15 DIAGNOSIS — M2042 Other hammer toe(s) (acquired), left foot: Secondary | ICD-10-CM | POA: Diagnosis not present

## 2017-02-18 DIAGNOSIS — K219 Gastro-esophageal reflux disease without esophagitis: Secondary | ICD-10-CM | POA: Diagnosis not present

## 2017-02-18 DIAGNOSIS — I10 Essential (primary) hypertension: Secondary | ICD-10-CM | POA: Diagnosis not present

## 2017-02-18 DIAGNOSIS — E1149 Type 2 diabetes mellitus with other diabetic neurological complication: Secondary | ICD-10-CM | POA: Diagnosis not present

## 2017-02-18 DIAGNOSIS — N189 Chronic kidney disease, unspecified: Secondary | ICD-10-CM | POA: Diagnosis not present

## 2017-02-18 DIAGNOSIS — I5033 Acute on chronic diastolic (congestive) heart failure: Secondary | ICD-10-CM | POA: Diagnosis not present

## 2017-02-25 DIAGNOSIS — D649 Anemia, unspecified: Secondary | ICD-10-CM | POA: Diagnosis not present

## 2017-02-25 DIAGNOSIS — N189 Chronic kidney disease, unspecified: Secondary | ICD-10-CM | POA: Diagnosis not present

## 2017-02-25 DIAGNOSIS — I5033 Acute on chronic diastolic (congestive) heart failure: Secondary | ICD-10-CM | POA: Diagnosis not present

## 2017-02-28 ENCOUNTER — Other Ambulatory Visit: Payer: Self-pay | Admitting: Hematology and Oncology

## 2017-02-28 ENCOUNTER — Telehealth: Payer: Self-pay | Admitting: *Deleted

## 2017-02-28 ENCOUNTER — Other Ambulatory Visit (HOSPITAL_BASED_OUTPATIENT_CLINIC_OR_DEPARTMENT_OTHER): Payer: Medicare Other

## 2017-02-28 ENCOUNTER — Telehealth: Payer: Self-pay | Admitting: Hematology and Oncology

## 2017-02-28 ENCOUNTER — Encounter: Payer: Self-pay | Admitting: Hematology and Oncology

## 2017-02-28 ENCOUNTER — Ambulatory Visit (HOSPITAL_BASED_OUTPATIENT_CLINIC_OR_DEPARTMENT_OTHER): Payer: Medicare Other | Admitting: Hematology and Oncology

## 2017-02-28 VITALS — BP 166/68 | HR 64 | Temp 98.7°F | Resp 20 | Ht 63.0 in | Wt 194.2 lb

## 2017-02-28 DIAGNOSIS — I10 Essential (primary) hypertension: Secondary | ICD-10-CM

## 2017-02-28 DIAGNOSIS — N179 Acute kidney failure, unspecified: Secondary | ICD-10-CM

## 2017-02-28 DIAGNOSIS — N184 Chronic kidney disease, stage 4 (severe): Secondary | ICD-10-CM

## 2017-02-28 DIAGNOSIS — N189 Chronic kidney disease, unspecified: Principal | ICD-10-CM

## 2017-02-28 DIAGNOSIS — R5381 Other malaise: Secondary | ICD-10-CM | POA: Diagnosis not present

## 2017-02-28 DIAGNOSIS — D631 Anemia in chronic kidney disease: Secondary | ICD-10-CM

## 2017-02-28 DIAGNOSIS — R630 Anorexia: Secondary | ICD-10-CM | POA: Insufficient documentation

## 2017-02-28 DIAGNOSIS — R5383 Other fatigue: Secondary | ICD-10-CM | POA: Insufficient documentation

## 2017-02-28 LAB — CBC WITH DIFFERENTIAL/PLATELET
BASO%: 1.1 % (ref 0.0–2.0)
Basophils Absolute: 0.1 10*3/uL (ref 0.0–0.1)
EOS ABS: 0.4 10*3/uL (ref 0.0–0.5)
EOS%: 5.2 % (ref 0.0–7.0)
HCT: 28 % — ABNORMAL LOW (ref 34.8–46.6)
HGB: 9 g/dL — ABNORMAL LOW (ref 11.6–15.9)
LYMPH%: 14.3 % (ref 14.0–49.7)
MCH: 26.9 pg (ref 25.1–34.0)
MCHC: 32.2 g/dL (ref 31.5–36.0)
MCV: 83.3 fL (ref 79.5–101.0)
MONO#: 0.5 10*3/uL (ref 0.1–0.9)
MONO%: 6.9 % (ref 0.0–14.0)
NEUT%: 72.5 % (ref 38.4–76.8)
NEUTROS ABS: 5.2 10*3/uL (ref 1.5–6.5)
PLATELETS: 260 10*3/uL (ref 145–400)
RBC: 3.37 10*6/uL — AB (ref 3.70–5.45)
RDW: 17 % — ABNORMAL HIGH (ref 11.2–14.5)
WBC: 7.2 10*3/uL (ref 3.9–10.3)
lymph#: 1 10*3/uL (ref 0.9–3.3)

## 2017-02-28 MED ORDER — DARBEPOETIN ALFA 200 MCG/0.4ML IJ SOSY
200.0000 ug | PREFILLED_SYRINGE | Freq: Once | INTRAMUSCULAR | Status: AC
  Administered 2017-02-28: 200 ug via SUBCUTANEOUS
  Filled 2017-02-28: qty 0.4

## 2017-02-28 NOTE — Telephone Encounter (Signed)
LM for daughter to call

## 2017-02-28 NOTE — Telephone Encounter (Signed)
Scheduled appt per 6/4 sch message.

## 2017-02-28 NOTE — Assessment & Plan Note (Signed)
I reviewed test results with the patient and her daughter. Her hemoglobin is 9 and she does not need blood transfusion I plan to give her darbepoetin injection today rather than waiting to next week. I plan to see her back again in 3 weeks for repeat blood work and injection as needed.

## 2017-02-28 NOTE — Assessment & Plan Note (Signed)
She has significant anorexia. Her mobility is poor. I suspect her poor appetite could be due to lack of physical activity. We discussed appetite stimulant or increase activity as tolerated.  I encouraged the patient today and will reassess in a few weeks

## 2017-02-28 NOTE — Assessment & Plan Note (Signed)
The patient has significant generalized deconditioning. She has lacked motivation and is not participating in rehab She is very weak Her risk of fall is very high I recommend patient and family member to look for assisted living facility rather than discharge home from skilled facility.

## 2017-02-28 NOTE — Assessment & Plan Note (Signed)
she will continue current medical management. Her blood pressure is suboptimally controlled. I recommend close follow-up with primary care doctor for medication adjustment.

## 2017-02-28 NOTE — Telephone Encounter (Signed)
Spoke with daughter, will have [name] transported to [facility] this morning

## 2017-02-28 NOTE — Progress Notes (Signed)
[facility] OFFICE PROGRESS NOTE  Patient Care Team: Jinny Sanders, MD as PCP - General (Family Medicine)  SUMMARY OF ONCOLOGIC HISTORY:  [name] is here because of severe anemia. The patient have chronic anemia on and off for the past few years. She has undergone extensive evaluation in the past. Early 2015, she was found to have severe anemia with hemoglobin of 6.8 and underwent extensive evaluation and was found to have iron deficiency. She was placed on oral iron supplement which she takes 2 a day for a while. She also received blood transfusion in the past. She did not undergo repeat GI evaluation last year. Her last colonoscopy was in 2009. Recently, she was hospitalized for pneumonia between 09/15/2015 to 09/19/2015. She had acute mental status change and acute renal failure which improved with conservative management She had a low hemoglobin of 6.4 and received blood transfusion in the hospital. She received antibiotic for pneumonia. At present time, she complained of fatigue. She denies recent chest pain on exertion, shortness of breath on minimal exertion, pre-syncopal episodes, or palpitations. She had not noticed any recent bleeding such as epistaxis, hematuria or hematochezia The patient denies over the counter NSAID ingestion. She is on antiplatelets agents. Her last colonoscopy was in 2009. She does not recall having EGD She denies any pica and eats a variety of diet. She never donated blood She also have interesting history of right breast cancer, discovered when she was 10 and premenopausal. From her collection, she may have stage II disease due to regional lymph nodes involvement. She had mastectomy and complete lymph node dissection on the right axilla. She recalled receiving some form of chemotherapy but never received tamoxifen or radiation. In the 90s, she was diagnosed with uterine cancer due to abnormal postmenopausal bleeding. She had  complete hysterectomy but never received adjuvant treatment. On 10/02/2015, she received blood transfusion for severe anemia On 10/09/2015, she started on Aranesp injection The patient has nondisplaced right hip fracture in August and was hospitalized. That was managed conservatively   INTERVAL HISTORY: Please see below for problem oriented charting. The family is requesting urgent visit because repeat blood work done at the skilled facility came back low at 7.9 The patient has been sitting around or sleeping more than usual.  She has excessive fatigue She has lacked motivation to move or participate in rehab Her appetite is poor She denies chest pain or shortness of breath She had recurrent hospitalization recently for various different problems including most recent hospitalization for urinary tract infection. She denies dysuria, frequency or urgency  REVIEW OF SYSTEMS:   Constitutional: Denies fevers, chills or abnormal weight loss Eyes: Denies blurriness of vision Ears, nose, mouth, throat, and face: Denies mucositis or sore throat Respiratory: Denies cough, dyspnea or wheezes Cardiovascular: Denies palpitation, chest discomfort or lower extremity swelling Gastrointestinal:  Denies nausea, heartburn or change in bowel habits Skin: Denies abnormal skin rashes Lymphatics: Denies new lymphadenopathy or easy bruising Behavioral/Psych: Mood is stable, no new changes  All other systems were reviewed with the patient and are negative.  I have reviewed the past medical history, past surgical history, social history and family history with the patient and they are unchanged from previous note.  ALLERGIES:  is allergic to levofloxacin; morphine and related; and tape.  MEDICATIONS:  Current Outpatient Prescriptions  Medication Sig Dispense Refill  . amLODipine (NORVASC) 5 MG tablet Take 1 tablet (5 mg total) by mouth daily. 30 tablet 0  .  aspirin 81 MG tablet Take 81 mg by mouth at  bedtime.     Marland Kitchen atorvastatin (LIPITOR) 80 MG tablet Take 1 tablet (80 mg total) by mouth daily. 90 tablet 3  . Blood Glucose Monitoring Suppl (ACCU-CHEK AVIVA) device Use to check blood sugar once daily.  Dx: E11.43 1 each 0  . Cyanocobalamin (VITAMIN B-12 CR PO) Take 1 tablet by mouth daily.    . furosemide (LASIX) 20 MG tablet Take 1 tablet (20 mg total) by mouth daily. 30 tablet 11  . gabapentin (NEURONTIN) 100 MG capsule Take 2 capsules (200 mg total) by mouth at bedtime. Can increase to 300 mg at bedtime if still having neuropathy. (Patient taking differently: Take 200-300 mg by mouth at bedtime. Take  200mg  daily at bedtime and Can increase to 300 mg at bedtime if still having neuropathy.) 270 capsule 1  . glipiZIDE (GLUCOTROL XL) 5 MG 24 hr tablet TAKE 1 TABLET (5 MG TOTAL) BY MOUTH DAILY WITH BREAKFAST. 30 tablet 5  . glucose blood (ACCU-CHEK AVIVA) test strip Use to check blood sugar once daily.  Dx: E11.43 100 each 3  . IRON PO Take 1 tablet by mouth.    Elmore Guise Devices (ACCU-CHEK SOFTCLIX) lancets Use to check blood sugar once daily.  Dx: E11.43 1 each 0  . levothyroxine (SYNTHROID, LEVOTHROID) 50 MCG tablet TAKE 1 TABLET BY MOUTH EVERY DAY 90 tablet 1  . metoCLOPramide (REGLAN) 5 MG tablet Take 1 tablet (5 mg total) by mouth 3 (three) times daily before meals. 90 tablet 0  . metoprolol succinate (TOPROL-XL) 25 MG 24 hr tablet Take 25 mg by mouth 2 (two) times daily.    . nitroGLYCERIN (NITROSTAT) 0.4 MG SL tablet Place 1 tablet (0.4 mg total) under the tongue every 5 (five) minutes as needed for chest pain (x 3 tabs daily). 25 tablet 3  . pantoprazole (PROTONIX) 20 MG tablet Take 1 tablet (20 mg total) by mouth 2 (two) times daily. 60 tablet 0  . sertraline (ZOLOFT) 25 MG tablet TAKE 1 TABLET (25 MG TOTAL) BY MOUTH DAILY. 30 tablet 5  . Vitamin D, Ergocalciferol, (DRISDOL) 50000 units CAPS capsule TAKE 1 CAPSULE BY MOUTH EVERY 7 DAYS. 4 capsule 2   No current facility-administered  medications for this visit.     PHYSICAL EXAMINATION: ECOG PERFORMANCE STATUS: 3 - Symptomatic, >50% confined to bed  Vitals:   02/28/17 1125 02/28/17 1208  BP: (!) 169/53 (!) 166/68  Pulse: 64   Resp: 20   Temp: 98.7 F (37.1 C)    Filed Weights   02/28/17 1125  Weight: 194 lb 3.2 oz (88.1 kg)    GENERAL:alert, no distress and comfortable.  She looks debilitated SKIN: skin color, texture, turgor are normal, no rashes or significant lesions EYES: normal, Conjunctiva are pink and non-injected, sclera clear OROPHARYNX:no exudate, no erythema and lips, buccal mucosa, and tongue normal  NECK: supple, thyroid normal size, non-tender, without nodularity LYMPH:  no palpable lymphadenopathy in the cervical, axillary or inguinal LUNGS: clear to auscultation and percussion with normal breathing effort HEART: regular rate & rhythm and no murmurs and no lower extremity edema ABDOMEN:abdomen soft, non-tender and normal bowel sounds Musculoskeletal:no cyanosis of digits and no clubbing  NEURO: alert & oriented x 3 with fluent speech, no focal motor/sensory deficits  LABORATORY DATA:  I have reviewed the data as listed    Component Value Date/Time   NA 138 02/03/2017 0532   NA 141 06/17/2016 1147  K 3.1 (L) 02/03/2017 0532   K 4.0 06/17/2016 1147   CL 101 02/03/2017 0532   CO2 29 02/03/2017 0532   CO2 25 06/17/2016 1147   GLUCOSE 147 (H) 02/03/2017 0532   GLUCOSE 163 (H) 06/17/2016 1147   BUN 19 02/03/2017 0532   BUN 25.8 06/17/2016 1147   CREATININE 1.39 (H) 02/03/2017 0532   CREATININE 1.3 (H) 06/17/2016 1147   CALCIUM 8.4 (L) 02/03/2017 0532   CALCIUM 9.0 06/17/2016 1147   PROT 6.0 (L) 02/03/2017 0532   PROT 6.2 (L) 06/17/2016 1147   ALBUMIN 3.0 (L) 02/03/2017 0532   ALBUMIN 2.9 (L) 06/17/2016 1147   AST 15 02/03/2017 0532   AST 13 06/17/2016 1147   ALT 12 (L) 02/03/2017 0532   ALT 9 06/17/2016 1147   ALKPHOS 75 02/03/2017 0532   ALKPHOS 119 06/17/2016 1147   BILITOT  0.7 02/03/2017 0532   BILITOT 0.70 06/17/2016 1147   GFRNONAA 35 (L) 02/03/2017 0532   GFRAA 40 (L) 02/03/2017 0532    No results found for: SPEP, UPEP  Lab Results  Component Value Date   WBC 7.2 02/28/2017   NEUTROABS 5.2 02/28/2017   HGB 9.0 (L) 02/28/2017   HCT 28.0 (L) 02/28/2017   MCV 83.3 02/28/2017   PLT 260 02/28/2017      Chemistry      Component Value Date/Time   NA 138 02/03/2017 0532   NA 141 06/17/2016 1147   K 3.1 (L) 02/03/2017 0532   K 4.0 06/17/2016 1147   CL 101 02/03/2017 0532   CO2 29 02/03/2017 0532   CO2 25 06/17/2016 1147   BUN 19 02/03/2017 0532   BUN 25.8 06/17/2016 1147   CREATININE 1.39 (H) 02/03/2017 0532   CREATININE 1.3 (H) 06/17/2016 1147      Component Value Date/Time   CALCIUM 8.4 (L) 02/03/2017 0532   CALCIUM 9.0 06/17/2016 1147   ALKPHOS 75 02/03/2017 0532   ALKPHOS 119 06/17/2016 1147   AST 15 02/03/2017 0532   AST 13 06/17/2016 1147   ALT 12 (L) 02/03/2017 0532   ALT 9 06/17/2016 1147   BILITOT 0.7 02/03/2017 0532   BILITOT 0.70 06/17/2016 1147       RADIOGRAPHIC STUDIES: I have personally reviewed the radiological images as listed and agreed with the findings in the report. Dg Chest 2 View  Result Date: 01/30/2017 CLINICAL DATA:  Cough and recurrent vomiting. Patient is taking antibiotics for urinary tract infection. EXAM: CHEST  2 VIEW COMPARISON:  05/15/2016 FINDINGS: Small bilateral pleural effusions, greater on the left. Mild cardiac enlargement. No vascular congestion. No pneumothorax. Postoperative changes in the mediastinum. Degenerative changes in the spine and shoulders. Calcified and tortuous aorta. IMPRESSION: Bilateral pleural effusions, greater on the left. Mild cardiac enlargement. No focal consolidation in the lungs. Electronically Signed   By: Lucienne Capers M.D.   On: 01/30/2017 23:34   Dg Abd 1 View  Result Date: 01/30/2017 CLINICAL DATA:  Urinary tract infection this past Friday, placed on Bactrim.  Nausea and vomiting since antibiotics. EXAM: ABDOMEN - 1 VIEW COMPARISON:  CT pelvis 05/14/2016.  Abdomen 09/18/2015 FINDINGS: Scattered gas and stool throughout the colon. No small or large bowel distention. No radiopaque stones. Surgical clips in the mid abdomen and pelvis. Vascular calcifications. Postoperative changes in the right hip. Thoracolumbar scoliosis convex towards the left with degenerative changes in the spine and left hip. IMPRESSION: Nonobstructive bowel gas pattern. Electronically Signed   By: Oren Beckmann.D.  On: 01/30/2017 23:33   Ct Renal Stone Study  Result Date: 02/01/2017 CLINICAL DATA:  Nausea and vomiting for 3 days EXAM: CT ABDOMEN AND PELVIS WITHOUT CONTRAST TECHNIQUE: Multidetector CT imaging of the abdomen and pelvis was performed following the standard protocol without IV contrast. COMPARISON:  Plain film from earlier in the same day FINDINGS: Lower chest: Bilateral small pleural effusions are noted. Mild bibasilar consolidation is noted as well. Some changes of bronchiectasis are seen. Hepatobiliary: No focal liver abnormality is seen. No gallstones, gallbladder wall thickening, or biliary dilatation. Pancreas: Unremarkable. No pancreatic ductal dilatation or surrounding inflammatory changes. Spleen: Normal in size without focal abnormality. Adrenals/Urinary Tract: Adrenal glands are unremarkable. Kidneys are normal, without renal calculi, focal lesion, or hydronephrosis. Bladder is well distended. Some air is noted within likely related to recent instrumentation. Stomach/Bowel: Large hiatal hernia is noted with almost the entire stomach within the chest cavity. A mild degree of organo-axial volvulus is noted as well combined with hiatal hernia. No inflammatory or obstructive changes are noted. Vascular/Lymphatic: Significant aortic calcifications are noted without aneurysmal dilatation. No definitive lymphadenopathy is seen. Reproductive: Status post hysterectomy. No  adnexal masses. Other: No abdominal wall hernia or abnormality. No abdominopelvic ascites. Musculoskeletal: Prior right hip replacement is noted. Degenerative changes of lumbar spine are seen. IMPRESSION: Bilateral small pleural effusions with bibasilar consolidation. Large hiatal hernia with a component of organo-axial volvulus of the stomach. Air within the bladder likely related to recent instrumentation. Electronically Signed   By: Inez Catalina M.D.   On: 02/01/2017 10:52    ASSESSMENT & PLAN:  Anemia due to chronic renal failure treated with erythropoietin I reviewed test results with the patient and her daughter. Her hemoglobin is 9 and she does not need blood transfusion I plan to give her darbepoetin injection today rather than waiting to next week. I plan to see her back again in 3 weeks for repeat blood work and injection as needed.  Essential hypertension she will continue current medical management. Her blood pressure is suboptimally controlled. I recommend close follow-up with primary care doctor for medication adjustment.   Physical debility The patient has significant generalized deconditioning. She has lacked motivation and is not participating in rehab She is very weak Her risk of fall is very high I recommend patient and family member to look for assisted living facility rather than discharge home from skilled facility.  Anorexia She has significant anorexia. Her mobility is poor. I suspect her poor appetite could be due to lack of physical activity. We discussed appetite stimulant or increase activity as tolerated.  I encouraged the patient today and will reassess in a few weeks  Other fatigue Due to her complaints of excessive fatigue, I will order TSH in the next visit.   No orders of the defined types were placed in this encounter.  All questions were answered. The patient knows to call the clinic with any problems, questions or concerns. No barriers to  learning was detected. I spent 20 minutes counseling the patient face to face. The total time spent in the appointment was 25 minutes and more than 50% was on counseling and review of test results     Heath Lark, MD 02/28/2017 1:48 PM

## 2017-02-28 NOTE — Telephone Encounter (Signed)
-----   Message from Heath Lark, MD sent at 02/28/2017  6:52 AM EDT ----- Regarding: anemic Family called over the weekend. Someone checked her hemoglobin and daughter said it was 7.9. Can you call daughter to see if they can bring her in for labs, see me today? Needs morning in case we need to give her blood

## 2017-02-28 NOTE — Assessment & Plan Note (Signed)
Due to her complaints of excessive fatigue, I will order TSH in the next visit.

## 2017-03-01 ENCOUNTER — Telehealth: Payer: Self-pay | Admitting: Hematology and Oncology

## 2017-03-01 ENCOUNTER — Encounter: Payer: Medicare Other | Admitting: Family Medicine

## 2017-03-01 ENCOUNTER — Ambulatory Visit: Payer: Medicare Other | Admitting: Family Medicine

## 2017-03-01 NOTE — Telephone Encounter (Signed)
Spoke with dtr Santiago Glad re June appointments.

## 2017-03-04 DIAGNOSIS — D649 Anemia, unspecified: Secondary | ICD-10-CM | POA: Diagnosis not present

## 2017-03-04 DIAGNOSIS — L539 Erythematous condition, unspecified: Secondary | ICD-10-CM | POA: Diagnosis not present

## 2017-03-04 DIAGNOSIS — N189 Chronic kidney disease, unspecified: Secondary | ICD-10-CM | POA: Diagnosis not present

## 2017-03-04 DIAGNOSIS — R600 Localized edema: Secondary | ICD-10-CM | POA: Diagnosis not present

## 2017-03-07 ENCOUNTER — Ambulatory Visit: Payer: Medicare Other | Admitting: Hematology and Oncology

## 2017-03-07 ENCOUNTER — Ambulatory Visit: Payer: Medicare Other

## 2017-03-07 ENCOUNTER — Other Ambulatory Visit: Payer: Medicare Other

## 2017-03-07 ENCOUNTER — Ambulatory Visit: Payer: Self-pay | Admitting: Neurology

## 2017-03-08 ENCOUNTER — Ambulatory Visit: Payer: Self-pay | Admitting: Neurology

## 2017-03-12 DIAGNOSIS — Z79899 Other long term (current) drug therapy: Secondary | ICD-10-CM | POA: Diagnosis not present

## 2017-03-18 DIAGNOSIS — E039 Hypothyroidism, unspecified: Secondary | ICD-10-CM | POA: Diagnosis not present

## 2017-03-18 DIAGNOSIS — I10 Essential (primary) hypertension: Secondary | ICD-10-CM | POA: Diagnosis not present

## 2017-03-18 DIAGNOSIS — E119 Type 2 diabetes mellitus without complications: Secondary | ICD-10-CM | POA: Diagnosis not present

## 2017-03-21 ENCOUNTER — Ambulatory Visit (HOSPITAL_BASED_OUTPATIENT_CLINIC_OR_DEPARTMENT_OTHER): Payer: Medicare Other | Admitting: Hematology and Oncology

## 2017-03-21 ENCOUNTER — Ambulatory Visit (HOSPITAL_BASED_OUTPATIENT_CLINIC_OR_DEPARTMENT_OTHER): Payer: Medicare Other

## 2017-03-21 ENCOUNTER — Telehealth: Payer: Self-pay | Admitting: Hematology and Oncology

## 2017-03-21 ENCOUNTER — Encounter: Payer: Self-pay | Admitting: Hematology and Oncology

## 2017-03-21 ENCOUNTER — Other Ambulatory Visit (HOSPITAL_BASED_OUTPATIENT_CLINIC_OR_DEPARTMENT_OTHER): Payer: Medicare Other

## 2017-03-21 DIAGNOSIS — N189 Chronic kidney disease, unspecified: Secondary | ICD-10-CM

## 2017-03-21 DIAGNOSIS — N179 Acute kidney failure, unspecified: Secondary | ICD-10-CM

## 2017-03-21 DIAGNOSIS — R630 Anorexia: Secondary | ICD-10-CM

## 2017-03-21 DIAGNOSIS — N184 Chronic kidney disease, stage 4 (severe): Principal | ICD-10-CM

## 2017-03-21 DIAGNOSIS — I10 Essential (primary) hypertension: Secondary | ICD-10-CM

## 2017-03-21 DIAGNOSIS — D631 Anemia in chronic kidney disease: Secondary | ICD-10-CM

## 2017-03-21 DIAGNOSIS — N182 Chronic kidney disease, stage 2 (mild): Principal | ICD-10-CM

## 2017-03-21 DIAGNOSIS — R5383 Other fatigue: Secondary | ICD-10-CM

## 2017-03-21 LAB — CBC WITH DIFFERENTIAL/PLATELET
BASO%: 1.2 % (ref 0.0–2.0)
Basophils Absolute: 0.1 10*3/uL (ref 0.0–0.1)
EOS%: 4.6 % (ref 0.0–7.0)
Eosinophils Absolute: 0.4 10*3/uL (ref 0.0–0.5)
HCT: 31.7 % — ABNORMAL LOW (ref 34.8–46.6)
HGB: 10.4 g/dL — ABNORMAL LOW (ref 11.6–15.9)
LYMPH%: 14.5 % (ref 14.0–49.7)
MCH: 27.2 pg (ref 25.1–34.0)
MCHC: 32.7 g/dL (ref 31.5–36.0)
MCV: 83.3 fL (ref 79.5–101.0)
MONO#: 0.6 10*3/uL (ref 0.1–0.9)
MONO%: 7.2 % (ref 0.0–14.0)
NEUT#: 6 10*3/uL (ref 1.5–6.5)
NEUT%: 72.5 % (ref 38.4–76.8)
Platelets: 200 10*3/uL (ref 145–400)
RBC: 3.8 10*6/uL (ref 3.70–5.45)
RDW: 19.9 % — ABNORMAL HIGH (ref 11.2–14.5)
WBC: 8.3 10*3/uL (ref 3.9–10.3)
lymph#: 1.2 10*3/uL (ref 0.9–3.3)

## 2017-03-21 LAB — TSH: TSH: 0.696 m(IU)/L (ref 0.308–3.960)

## 2017-03-21 MED ORDER — DARBEPOETIN ALFA 200 MCG/0.4ML IJ SOSY
200.0000 ug | PREFILLED_SYRINGE | Freq: Once | INTRAMUSCULAR | Status: AC
  Administered 2017-03-21: 200 ug via SUBCUTANEOUS
  Filled 2017-03-21: qty 0.4

## 2017-03-21 NOTE — Telephone Encounter (Signed)
Scheduled appt per 6/25 los - Gave patient AVS and calender per los.  

## 2017-03-21 NOTE — Assessment & Plan Note (Signed)
I reviewed test results with the patient and her daughter. Her hemoglobin is improving nicely to Aranesp and she does not need blood transfusion I plan to give her darbepoetin injection today and then space out her appointment to every 4 weeks I plan to see her back again in 4 weeks for repeat blood work and injection as needed.

## 2017-03-21 NOTE — Assessment & Plan Note (Signed)
She has significant anorexia. Her mobility is poor. I suspect her poor appetite could be due to lack of physical activity. We discussed extensively about strategies to increase appetite I suspect her recent weight loss is due to fluid weight loss rather than body mass weight loss

## 2017-03-21 NOTE — Progress Notes (Signed)
[place] OFFICE PROGRESS NOTE  Patient Care Team: Jinny Sanders, MD as PCP - General (Family Medicine)  SUMMARY OF ONCOLOGIC HISTORY:  [name] is here because of severe anemia. The patient have chronic anemia on and off for the past few years. She has undergone extensive evaluation in the past. Early 2015, she was found to have severe anemia with hemoglobin of 6.8 and underwent extensive evaluation and was found to have iron deficiency. She was placed on oral iron supplement which she takes 2 a day for a while. She also received blood transfusion in the past. She did not undergo repeat GI evaluation last year. Her last colonoscopy was in 2009. Recently, she was hospitalized for pneumonia between 09/15/2015 to 09/19/2015. She had acute mental status change and acute renal failure which improved with conservative management She had a low hemoglobin of 6.4 and received blood transfusion in the hospital. She received antibiotic for pneumonia. At present time, she complained of fatigue. She denies recent chest pain on exertion, shortness of breath on minimal exertion, pre-syncopal episodes, or palpitations. She had not noticed any recent bleeding such as epistaxis, hematuria or hematochezia The patient denies over the counter NSAID ingestion. She is on antiplatelets agents. Her last colonoscopy was in 2009. She does not recall having EGD She denies any pica and eats a variety of diet. She never donated blood She also have interesting history of right breast cancer, discovered when she was 15 and premenopausal. From her collection, she may have stage II disease due to regional lymph nodes involvement. She had mastectomy and complete lymph node dissection on the right axilla. She recalled receiving some form of chemotherapy but never received tamoxifen or radiation. In the 90s, she was diagnosed with uterine cancer due to abnormal postmenopausal bleeding. She had  complete hysterectomy but never received adjuvant treatment. On 10/02/2015, she received blood transfusion for severe anemia On 10/09/2015, she started on Aranesp injection The patient has nondisplaced right hip fracture in August and was hospitalized. That was managed conservatively   INTERVAL HISTORY: Please see below for problem oriented charting. She feels well Denies recent chest pain or shortness of breath Her oral intake remained poor She has lost 15 pounds of weight in 3 weeks Leg swelling has improved The patient denies any recent signs or symptoms of bleeding such as spontaneous epistaxis, hematuria or hematochezia. She is not participating in physical rehabilitation efforts  REVIEW OF SYSTEMS:   Constitutional: Denies fevers, chills  Eyes: Denies blurriness of vision Ears, nose, mouth, throat, and face: Denies mucositis or sore throat Respiratory: Denies cough, dyspnea or wheezes Cardiovascular: Denies palpitation, chest discomfort or lower extremity swelling Gastrointestinal:  Denies nausea, heartburn or change in bowel habits Skin: Denies abnormal skin rashes Lymphatics: Denies new lymphadenopathy or easy bruising Neurological:Denies numbness, tingling or new weaknesses Behavioral/Psych: Mood is stable, no new changes  All other systems were reviewed with the patient and are negative.  I have reviewed the past medical history, past surgical history, social history and family history with the patient and they are unchanged from previous note.  ALLERGIES:  is allergic to levofloxacin; morphine and related; and tape.  MEDICATIONS:  Current Outpatient Prescriptions  Medication Sig Dispense Refill  . amLODipine (NORVASC) 5 MG tablet Take 1 tablet (5 mg total) by mouth daily. 30 tablet 0  . aspirin 81 MG tablet Take 81 mg by mouth at bedtime.     Marland Kitchen atorvastatin (LIPITOR) 80 MG tablet Take 1  tablet (80 mg total) by mouth daily. 90 tablet 3  . Blood Glucose Monitoring  Suppl (ACCU-CHEK AVIVA) device Use to check blood sugar once daily.  Dx: E11.43 1 each 0  . Cyanocobalamin (VITAMIN B-12 CR PO) Take 1 tablet by mouth daily.    . furosemide (LASIX) 20 MG tablet Take 1 tablet (20 mg total) by mouth daily. 30 tablet 11  . gabapentin (NEURONTIN) 100 MG capsule Take 2 capsules (200 mg total) by mouth at bedtime. Can increase to 300 mg at bedtime if still having neuropathy. (Patient taking differently: Take 200-300 mg by mouth at bedtime. Take  200mg  daily at bedtime and Can increase to 300 mg at bedtime if still having neuropathy.) 270 capsule 1  . glipiZIDE (GLUCOTROL XL) 5 MG 24 hr tablet TAKE 1 TABLET (5 MG TOTAL) BY MOUTH DAILY WITH BREAKFAST. 30 tablet 5  . glucose blood (ACCU-CHEK AVIVA) test strip Use to check blood sugar once daily.  Dx: E11.43 100 each 3  . IRON PO Take 1 tablet by mouth.    Elmore Guise Devices (ACCU-CHEK SOFTCLIX) lancets Use to check blood sugar once daily.  Dx: E11.43 1 each 0  . levothyroxine (SYNTHROID, LEVOTHROID) 50 MCG tablet TAKE 1 TABLET BY MOUTH EVERY DAY 90 tablet 1  . metoCLOPramide (REGLAN) 5 MG tablet Take 1 tablet (5 mg total) by mouth 3 (three) times daily before meals. 90 tablet 0  . metoprolol succinate (TOPROL-XL) 25 MG 24 hr tablet Take 25 mg by mouth 2 (two) times daily.    . nitroGLYCERIN (NITROSTAT) 0.4 MG SL tablet Place 1 tablet (0.4 mg total) under the tongue every 5 (five) minutes as needed for chest pain (x 3 tabs daily). 25 tablet 3  . pantoprazole (PROTONIX) 20 MG tablet Take 1 tablet (20 mg total) by mouth 2 (two) times daily. 60 tablet 0  . sertraline (ZOLOFT) 25 MG tablet TAKE 1 TABLET (25 MG TOTAL) BY MOUTH DAILY. 30 tablet 5  . Vitamin D, Ergocalciferol, (DRISDOL) 50000 units CAPS capsule TAKE 1 CAPSULE BY MOUTH EVERY 7 DAYS. 4 capsule 2   No current facility-administered medications for this visit.     PHYSICAL EXAMINATION: ECOG PERFORMANCE STATUS: 3 - Symptomatic, >50% confined to bed  Vitals:    03/21/17 1307  BP: (!) 157/56  Pulse: 62  Resp: 18  Temp: 98.3 F (36.8 C)   Filed Weights   03/21/17 1307  Weight: 180 lb 8 oz (81.9 kg)    GENERAL:alert, no distress and comfortable SKIN: skin color, texture, turgor are normal, no rashes or significant lesions EYES: normal, Conjunctiva are pale and non-injected, sclera clear OROPHARYNX:no exudate, no erythema and lips, buccal mucosa, and tongue normal  NECK: supple, thyroid normal size, non-tender, without nodularity LYMPH:  no palpable lymphadenopathy in the cervical, axillary or inguinal LUNGS: clear to auscultation and percussion with normal breathing effort HEART: regular rate & rhythm and no murmurs and mild lower extremity edema ABDOMEN:abdomen soft, non-tender and normal bowel sounds Musculoskeletal:no cyanosis of digits and no clubbing  NEURO: alert & oriented x 3 with fluent speech, no focal motor/sensory deficits  LABORATORY DATA:  I have reviewed the data as listed    Component Value Date/Time   NA 138 02/03/2017 0532   NA 141 06/17/2016 1147   K 3.1 (L) 02/03/2017 0532   K 4.0 06/17/2016 1147   CL 101 02/03/2017 0532   CO2 29 02/03/2017 0532   CO2 25 06/17/2016 1147   GLUCOSE 147 (H)  02/03/2017 0532   GLUCOSE 163 (H) 06/17/2016 1147   BUN 19 02/03/2017 0532   BUN 25.8 06/17/2016 1147   CREATININE 1.39 (H) 02/03/2017 0532   CREATININE 1.3 (H) 06/17/2016 1147   CALCIUM 8.4 (L) 02/03/2017 0532   CALCIUM 9.0 06/17/2016 1147   PROT 6.0 (L) 02/03/2017 0532   PROT 6.2 (L) 06/17/2016 1147   ALBUMIN 3.0 (L) 02/03/2017 0532   ALBUMIN 2.9 (L) 06/17/2016 1147   AST 15 02/03/2017 0532   AST 13 06/17/2016 1147   ALT 12 (L) 02/03/2017 0532   ALT 9 06/17/2016 1147   ALKPHOS 75 02/03/2017 0532   ALKPHOS 119 06/17/2016 1147   BILITOT 0.7 02/03/2017 0532   BILITOT 0.70 06/17/2016 1147   GFRNONAA 35 (L) 02/03/2017 0532   GFRAA 40 (L) 02/03/2017 0532    No results found for: SPEP, UPEP  Lab Results  Component  Value Date   WBC 8.3 03/21/2017   NEUTROABS 6.0 03/21/2017   HGB 10.4 (L) 03/21/2017   HCT 31.7 (L) 03/21/2017   MCV 83.3 03/21/2017   PLT 200 03/21/2017      Chemistry      Component Value Date/Time   NA 138 02/03/2017 0532   NA 141 06/17/2016 1147   K 3.1 (L) 02/03/2017 0532   K 4.0 06/17/2016 1147   CL 101 02/03/2017 0532   CO2 29 02/03/2017 0532   CO2 25 06/17/2016 1147   BUN 19 02/03/2017 0532   BUN 25.8 06/17/2016 1147   CREATININE 1.39 (H) 02/03/2017 0532   CREATININE 1.3 (H) 06/17/2016 1147      Component Value Date/Time   CALCIUM 8.4 (L) 02/03/2017 0532   CALCIUM 9.0 06/17/2016 1147   ALKPHOS 75 02/03/2017 0532   ALKPHOS 119 06/17/2016 1147   AST 15 02/03/2017 0532   AST 13 06/17/2016 1147   ALT 12 (L) 02/03/2017 0532   ALT 9 06/17/2016 1147   BILITOT 0.7 02/03/2017 0532   BILITOT 0.70 06/17/2016 1147      ASSESSMENT & PLAN:  Anemia due to chronic renal failure treated with erythropoietin I reviewed test results with the patient and her daughter. Her hemoglobin is improving nicely to Aranesp and she does not need blood transfusion I plan to give her darbepoetin injection today and then space out her appointment to every 4 weeks I plan to see her back again in 4 weeks for repeat blood work and injection as needed.  Anorexia She has significant anorexia. Her mobility is poor. I suspect her poor appetite could be due to lack of physical activity. We discussed extensively about strategies to increase appetite I suspect her recent weight loss is due to fluid weight loss rather than body mass weight loss  Essential hypertension she will continue current medical management. Her blood pressure is suboptimally controlled. I recommend close follow-up with primary care doctor for medication adjustment.    No orders of the defined types were placed in this encounter.  All questions were answered. The patient knows to call the clinic with any problems,  questions or concerns. No barriers to learning was detected. I spent 15 minutes counseling the patient face to face. The total time spent in the appointment was 20 minutes and more than 50% was on counseling and review of test results     Heath Lark, MD 03/21/2017 2:02 PM

## 2017-03-21 NOTE — Assessment & Plan Note (Signed)
she will continue current medical management. Her blood pressure is suboptimally controlled. I recommend close follow-up with primary care doctor for medication adjustment.

## 2017-04-06 DIAGNOSIS — H52203 Unspecified astigmatism, bilateral: Secondary | ICD-10-CM | POA: Diagnosis not present

## 2017-04-06 DIAGNOSIS — Z961 Presence of intraocular lens: Secondary | ICD-10-CM | POA: Diagnosis not present

## 2017-04-09 DIAGNOSIS — E119 Type 2 diabetes mellitus without complications: Secondary | ICD-10-CM | POA: Diagnosis not present

## 2017-04-09 DIAGNOSIS — I10 Essential (primary) hypertension: Secondary | ICD-10-CM | POA: Diagnosis not present

## 2017-04-09 DIAGNOSIS — E1022 Type 1 diabetes mellitus with diabetic chronic kidney disease: Secondary | ICD-10-CM | POA: Diagnosis not present

## 2017-04-09 DIAGNOSIS — Z79899 Other long term (current) drug therapy: Secondary | ICD-10-CM | POA: Diagnosis not present

## 2017-04-11 DIAGNOSIS — E1149 Type 2 diabetes mellitus with other diabetic neurological complication: Secondary | ICD-10-CM | POA: Diagnosis not present

## 2017-04-11 DIAGNOSIS — K449 Diaphragmatic hernia without obstruction or gangrene: Secondary | ICD-10-CM | POA: Diagnosis not present

## 2017-04-11 DIAGNOSIS — R600 Localized edema: Secondary | ICD-10-CM | POA: Diagnosis not present

## 2017-04-11 DIAGNOSIS — N189 Chronic kidney disease, unspecified: Secondary | ICD-10-CM | POA: Diagnosis not present

## 2017-04-18 ENCOUNTER — Ambulatory Visit (HOSPITAL_BASED_OUTPATIENT_CLINIC_OR_DEPARTMENT_OTHER): Payer: Medicare Other | Admitting: Hematology and Oncology

## 2017-04-18 ENCOUNTER — Telehealth: Payer: Self-pay | Admitting: Hematology and Oncology

## 2017-04-18 ENCOUNTER — Other Ambulatory Visit (HOSPITAL_BASED_OUTPATIENT_CLINIC_OR_DEPARTMENT_OTHER): Payer: Medicare Other

## 2017-04-18 ENCOUNTER — Ambulatory Visit: Payer: Medicare Other

## 2017-04-18 DIAGNOSIS — N189 Chronic kidney disease, unspecified: Secondary | ICD-10-CM

## 2017-04-18 DIAGNOSIS — N184 Chronic kidney disease, stage 4 (severe): Principal | ICD-10-CM

## 2017-04-18 DIAGNOSIS — D631 Anemia in chronic kidney disease: Secondary | ICD-10-CM

## 2017-04-18 DIAGNOSIS — R5381 Other malaise: Secondary | ICD-10-CM

## 2017-04-18 DIAGNOSIS — I10 Essential (primary) hypertension: Secondary | ICD-10-CM | POA: Diagnosis not present

## 2017-04-18 DIAGNOSIS — E119 Type 2 diabetes mellitus without complications: Secondary | ICD-10-CM | POA: Diagnosis not present

## 2017-04-18 DIAGNOSIS — N179 Acute kidney failure, unspecified: Secondary | ICD-10-CM

## 2017-04-18 LAB — CBC WITH DIFFERENTIAL/PLATELET
BASO%: 0.7 % (ref 0.0–2.0)
Basophils Absolute: 0.1 10*3/uL (ref 0.0–0.1)
EOS%: 2.6 % (ref 0.0–7.0)
Eosinophils Absolute: 0.3 10*3/uL (ref 0.0–0.5)
HEMATOCRIT: 33.4 % — AB (ref 34.8–46.6)
HEMOGLOBIN: 11.1 g/dL — AB (ref 11.6–15.9)
LYMPH#: 1.3 10*3/uL (ref 0.9–3.3)
LYMPH%: 10.1 % — ABNORMAL LOW (ref 14.0–49.7)
MCH: 27.8 pg (ref 25.1–34.0)
MCHC: 33.1 g/dL (ref 31.5–36.0)
MCV: 84 fL (ref 79.5–101.0)
MONO#: 0.7 10*3/uL (ref 0.1–0.9)
MONO%: 5.5 % (ref 0.0–14.0)
NEUT%: 81.1 % — AB (ref 38.4–76.8)
NEUTROS ABS: 10.5 10*3/uL — AB (ref 1.5–6.5)
PLATELETS: 150 10*3/uL (ref 145–400)
RBC: 3.98 10*6/uL (ref 3.70–5.45)
RDW: 16.6 % — AB (ref 11.2–14.5)
WBC: 12.9 10*3/uL — AB (ref 3.9–10.3)

## 2017-04-18 NOTE — Progress Notes (Signed)
Hemoglobin noted at 11.1 today. Injection Appt was canceled per Dr. Alvy Bimler

## 2017-04-18 NOTE — Telephone Encounter (Signed)
Scheduled appt per 7/23 los - Gave patient AVS and calender per los.  

## 2017-04-19 ENCOUNTER — Telehealth: Payer: Self-pay | Admitting: Cardiovascular Disease

## 2017-04-19 ENCOUNTER — Encounter: Payer: Self-pay | Admitting: Hematology and Oncology

## 2017-04-19 NOTE — Telephone Encounter (Signed)
Pt of Dr. Sallyanne Kuster [facility] pt.  Last seen for OV 04/2016  She has been doing fairly well, per daughter - notes she was hospitalized in May for UTI, was dc'ed to SNF at [facility] and is now there in assisted living as long term resident d/t dementia.  She was recently seen by Dr. Alvy Bimler, who communicated concerns to daughter for patient having shallow breathing, fluid on lungs.  Daughter does not identify acute symptoms, notes patient is generally deconditioned, so she wouldn't be sure if any acute SOB, but patient seems to be doing OK. She states usually [facility] RNs are very apt to call if they note any issues, (cited example of getting phone call if she gets a bruise on her arm)  Does note pt has stage IV kidney disease, other issues such as low Hgb which are being managed by Dr. Alvy Bimler.  We discussed, thought might be best to bring patient in for visit next week w PA to evaluate for any concerns. She will see Isaac Laud on 04/28/17 at 10:30am.

## 2017-04-19 NOTE — Progress Notes (Signed)
[name] OFF[facility] PROGRESS NOTE  Brenda Sanders, MD SUMMARY OF HEMATOLOGIC HISTORY:  [name] is here because of severe anemia. The patient have chronic anemia on and off for the past few years. She has undergone extensive evaluation in the past. Early 2015, she was found to have severe anemia with hemoglobin of 6.8 and underwent extensive evaluation and was found to have iron deficiency. She was placed on oral iron supplement which she takes 2 a day for a while. She also received blood transfusion in the past. She did not undergo repeat GI evaluation last year. Her last colonoscopy was in 2009. Recently, she was hospitalized for pneumonia between 09/15/2015 to 09/19/2015. She had acute mental status change and acute renal failure which improved with conservative management She had a low hemoglobin of 6.4 and received blood transfusion in the hospital. She received antibiotic for pneumonia. At present time, she complained of fatigue. She denies recent chest pain on exertion, shortness of breath on minimal exertion, pre-syncopal episodes, or palpitations. She had not noticed any recent bleeding such as epistaxis, hematuria or hematochezia The patient denies over the counter NSAID ingestion. She is on antiplatelets agents. Her last colonoscopy was in 2009. She does not recall having EGD She denies any pica and eats a variety of diet. She never donated blood She also have interesting history of right breast cancer, discovered when she was 68 and premenopausal. From her collection, she may have stage II disease due to regional lymph nodes involvement. She had mastectomy and complete lymph node dissection on the right axilla. She recalled receiving some form of chemotherapy but never received tamoxifen or radiation. In the 90s, she was diagnosed with uterine cancer due to abnormal postmenopausal bleeding. She had complete hysterectomy but never received adjuvant  treatment. On 10/02/2015, she received blood transfusion for severe anemia On 10/09/2015, she started on Aranesp injection The patient has nondisplaced right hip fracture in August and was hospitalized. That was managed conservatively INTERVAL HISTORY: [name] 83 y.o. female returns for further follow-up. According to her daughter, the patient continues to be unmotivated.  She is not participating in rehab.  She eats very little. There is no reported chest pain, shortness of breath or leg swelling. The patient denies any recent signs or symptoms of bleeding such as spontaneous epistaxis, hematuria or hematochezia.   I have reviewed the past medical history, past surgical history, social history and family history with the patient and they are unchanged from previous note.  ALLERGIES:  is allergic to levofloxacin; morphine and related; and tape.  MEDICATIONS:  Current Outpatient Prescriptions  Medication Sig Dispense Refill  . amLODipine (NORVASC) 5 MG tablet Take 1 tablet (5 mg total) by mouth daily. 30 tablet 0  . aspirin 81 MG tablet Take 81 mg by mouth at bedtime.     Marland Kitchen atorvastatin (LIPITOR) 80 MG tablet Take 1 tablet (80 mg total) by mouth daily. 90 tablet 3  . Blood Glucose Monitoring Suppl (ACCU-CHEK AVIVA) device Use to check blood sugar once daily.  Dx: E11.43 1 each 0  . Cyanocobalamin (VITAMIN B-12 CR PO) Take 1 tablet by mouth daily.    . furosemide (LASIX) 20 MG tablet Take 1 tablet (20 mg total) by mouth daily. 30 tablet 11  . gabapentin (NEURONTIN) 100 MG capsule Take 2 capsules (200 mg total) by mouth at bedtime. Can increase to 300 mg at bedtime if still having neuropathy. (Patient taking differently: Take 200-300 mg by  mouth at bedtime. Take  200mg  daily at bedtime and Can increase to 300 mg at bedtime if still having neuropathy.) 270 capsule 1  . glipiZIDE (GLUCOTROL XL) 5 MG 24 hr tablet TAKE 1 TABLET (5 MG TOTAL) BY MOUTH DAILY WITH BREAKFAST. 30 tablet 5  .  glucose blood (ACCU-CHEK AVIVA) test strip Use to check blood sugar once daily.  Dx: E11.43 100 each 3  . IRON PO Take 1 tablet by mouth.    Elmore Guise Devices (ACCU-CHEK SOFTCLIX) lancets Use to check blood sugar once daily.  Dx: E11.43 1 each 0  . levothyroxine (SYNTHROID, LEVOTHROID) 50 MCG tablet TAKE 1 TABLET BY MOUTH EVERY DAY 90 tablet 1  . metoCLOPramide (REGLAN) 5 MG tablet Take 1 tablet (5 mg total) by mouth 3 (three) times daily before meals. 90 tablet 0  . metoprolol succinate (TOPROL-XL) 25 MG 24 hr tablet Take 25 mg by mouth 2 (two) times daily.    . nitroGLYCERIN (NITROSTAT) 0.4 MG SL tablet Place 1 tablet (0.4 mg total) under the tongue every 5 (five) minutes as needed for chest pain (x 3 tabs daily). 25 tablet 3  . pantoprazole (PROTONIX) 20 MG tablet Take 1 tablet (20 mg total) by mouth 2 (two) times daily. 60 tablet 0  . sertraline (ZOLOFT) 25 MG tablet TAKE 1 TABLET (25 MG TOTAL) BY MOUTH DAILY. 30 tablet 5  . Vitamin D, Ergocalciferol, (DRISDOL) 50000 units CAPS capsule TAKE 1 CAPSULE BY MOUTH EVERY 7 DAYS. 4 capsule 2   No current facility-administered medications for this visit.      REVIEW OF SYSTEMS:   Constitutional: Denies fevers, chills or night sweats Eyes: Denies blurriness of vision Ears, nose, mouth, throat, and face: Denies mucositis or sore throat Respiratory: Denies cough, dyspnea or wheezes Cardiovascular: Denies palpitation, chest discomfort or lower extremity swelling Gastrointestinal:  Denies nausea, heartburn or change in bowel habits Skin: Denies abnormal skin rashes Lymphatics: Denies new lymphadenopathy or easy bruising Neurological:Denies numbness, tingling or new weaknesses Behavioral/Psych: Mood is stable, no new changes  All other systems were reviewed with the patient and are negative.  PHYSICAL EXAMINATION: ECOG PERFORMANCE STATUS: 3 - Symptomatic, >50% confined to bed  Vitals:   04/18/17 1256  BP: (!) 165/53  Pulse: 61  Resp: 18   Temp: 97.7 F (36.5 C)   Filed Weights   04/18/17 1256  Weight: 182 lb 3.2 oz (82.6 kg)    GENERAL:alert, no distress and comfortable SKIN: skin color, texture, turgor are normal, no rashes or significant lesions EYES: normal, Conjunctiva are pink and non-injected, sclera clear OROPHARYNX:no exudate, no erythema and lips, buccal mucosa, and tongue normal  NECK: supple, thyroid normal size, non-tender, without nodularity LYMPH:  no palpable lymphadenopathy in the cervical, axillary or inguinal LUNGS: clear to auscultation and percussion with normal breathing effort HEART: regular rate & rhythm and no murmurs and no lower extremity edema ABDOMEN:abdomen soft, non-tender and normal bowel sounds Musculoskeletal:no cyanosis of digits and no clubbing  NEURO: alert & oriented x 3 with fluent speech, no focal motor/sensory deficits  LABORATORY DATA:  I have reviewed the data as listed     Component Value Date/Time   NA 138 02/03/2017 0532   NA 141 06/17/2016 1147   K 3.1 (L) 02/03/2017 0532   K 4.0 06/17/2016 1147   CL 101 02/03/2017 0532   CO2 29 02/03/2017 0532   CO2 25 06/17/2016 1147   GLUCOSE 147 (H) 02/03/2017 0532   GLUCOSE 163 (H) 06/17/2016  1147   BUN 19 02/03/2017 0532   BUN 25.8 06/17/2016 1147   CREATININE 1.39 (H) 02/03/2017 0532   CREATININE 1.3 (H) 06/17/2016 1147   CALCIUM 8.4 (L) 02/03/2017 0532   CALCIUM 9.0 06/17/2016 1147   PROT 6.0 (L) 02/03/2017 0532   PROT 6.2 (L) 06/17/2016 1147   ALBUMIN 3.0 (L) 02/03/2017 0532   ALBUMIN 2.9 (L) 06/17/2016 1147   AST 15 02/03/2017 0532   AST 13 06/17/2016 1147   ALT 12 (L) 02/03/2017 0532   ALT 9 06/17/2016 1147   ALKPHOS 75 02/03/2017 0532   ALKPHOS 119 06/17/2016 1147   BILITOT 0.7 02/03/2017 0532   BILITOT 0.70 06/17/2016 1147   GFRNONAA 35 (L) 02/03/2017 0532   GFRAA 40 (L) 02/03/2017 0532    No results found for: SPEP, UPEP  Lab Results  Component Value Date   WBC 12.9 (H) 04/18/2017   NEUTROABS  10.5 (H) 04/18/2017   HGB 11.1 (L) 04/18/2017   HCT 33.4 (L) 04/18/2017   MCV 84.0 04/18/2017   PLT 150 04/18/2017      Chemistry      Component Value Date/Time   NA 138 02/03/2017 0532   NA 141 06/17/2016 1147   K 3.1 (L) 02/03/2017 0532   K 4.0 06/17/2016 1147   CL 101 02/03/2017 0532   CO2 29 02/03/2017 0532   CO2 25 06/17/2016 1147   BUN 19 02/03/2017 0532   BUN 25.8 06/17/2016 1147   CREATININE 1.39 (H) 02/03/2017 0532   CREATININE 1.3 (H) 06/17/2016 1147      Component Value Date/Time   CALCIUM 8.4 (L) 02/03/2017 0532   CALCIUM 9.0 06/17/2016 1147   ALKPHOS 75 02/03/2017 0532   ALKPHOS 119 06/17/2016 1147   AST 15 02/03/2017 0532   AST 13 06/17/2016 1147   ALT 12 (L) 02/03/2017 0532   ALT 9 06/17/2016 1147   BILITOT 0.7 02/03/2017 0532   BILITOT 0.70 06/17/2016 1147      ASSESSMENT & PLAN:  Anemia due to chronic renal failure treated with erythropoietin She has good response to darbepoetin injection with resolution of anemia We will skip injection today and reschedule to a few weeks from now In the future, I plan to space out her appointment to every 6 weeks or so  Essential hypertension she will continue current medical management. Her blood pressure is suboptimally controlled. I recommend close follow-up with primary care doctor for medication adjustment.   Physical debility She is significantly debilitated, and motivated and spent most of her time sitting down or lying in bed. This is despite numerous previous visits encouragement to motivate the patient I am wondering whether she is clinically depressed but the patient denies    No orders of the defined types were placed in this encounter.   All questions were answered. The patient knows to call the clinic with any problems, questions or concerns. No barriers to learning was detected.  I spent 10 minutes counseling the patient face to face. The total time spent in the appointment was 15 minutes and  more than 50% was on counseling.     Heath Lark, MD 7/24/20186:58 AM

## 2017-04-19 NOTE — Telephone Encounter (Signed)
Agree MCr 

## 2017-04-19 NOTE — Assessment & Plan Note (Signed)
she will continue current medical management. Her blood pressure is suboptimally controlled. I recommend close follow-up with primary care doctor for medication adjustment.

## 2017-04-19 NOTE — Assessment & Plan Note (Signed)
She has good response to darbepoetin injection with resolution of anemia We will skip injection today and reschedule to a few weeks from now In the future, I plan to space out her appointment to every 6 weeks or so 

## 2017-04-19 NOTE — Telephone Encounter (Signed)
Patient daughter Santiago Glad) calling, states patient had an office visit with her oncologist and was informed that patient had shallow breathing and fluid. Please call to discuss.

## 2017-04-19 NOTE — Assessment & Plan Note (Signed)
She is significantly debilitated, and motivated and spent most of her time sitting down or lying in bed. This is despite numerous previous visits encouragement to motivate the patient I am wondering whether she is clinically depressed but the patient denies

## 2017-04-22 DIAGNOSIS — E1149 Type 2 diabetes mellitus with other diabetic neurological complication: Secondary | ICD-10-CM | POA: Diagnosis not present

## 2017-04-22 DIAGNOSIS — B351 Tinea unguium: Secondary | ICD-10-CM | POA: Diagnosis not present

## 2017-04-22 DIAGNOSIS — L84 Corns and callosities: Secondary | ICD-10-CM | POA: Diagnosis not present

## 2017-04-24 DIAGNOSIS — R319 Hematuria, unspecified: Secondary | ICD-10-CM | POA: Diagnosis not present

## 2017-04-24 DIAGNOSIS — Z79899 Other long term (current) drug therapy: Secondary | ICD-10-CM | POA: Diagnosis not present

## 2017-04-24 DIAGNOSIS — N390 Urinary tract infection, site not specified: Secondary | ICD-10-CM | POA: Diagnosis not present

## 2017-04-28 ENCOUNTER — Encounter: Payer: Self-pay | Admitting: Physician Assistant

## 2017-04-28 ENCOUNTER — Ambulatory Visit (INDEPENDENT_AMBULATORY_CARE_PROVIDER_SITE_OTHER): Payer: Medicare Other | Admitting: Physician Assistant

## 2017-04-28 VITALS — BP 182/69 | HR 56 | Ht 63.0 in | Wt 178.2 lb

## 2017-04-28 DIAGNOSIS — I25810 Atherosclerosis of coronary artery bypass graft(s) without angina pectoris: Secondary | ICD-10-CM

## 2017-04-28 DIAGNOSIS — I5033 Acute on chronic diastolic (congestive) heart failure: Secondary | ICD-10-CM | POA: Diagnosis not present

## 2017-04-28 DIAGNOSIS — E785 Hyperlipidemia, unspecified: Secondary | ICD-10-CM

## 2017-04-28 DIAGNOSIS — I739 Peripheral vascular disease, unspecified: Secondary | ICD-10-CM

## 2017-04-28 DIAGNOSIS — Z79899 Other long term (current) drug therapy: Secondary | ICD-10-CM | POA: Diagnosis not present

## 2017-04-28 DIAGNOSIS — R112 Nausea with vomiting, unspecified: Secondary | ICD-10-CM | POA: Diagnosis not present

## 2017-04-28 DIAGNOSIS — R079 Chest pain, unspecified: Secondary | ICD-10-CM | POA: Diagnosis not present

## 2017-04-28 DIAGNOSIS — N183 Chronic kidney disease, stage 3 unspecified: Secondary | ICD-10-CM

## 2017-04-28 DIAGNOSIS — E119 Type 2 diabetes mellitus without complications: Secondary | ICD-10-CM

## 2017-04-28 DIAGNOSIS — I2510 Atherosclerotic heart disease of native coronary artery without angina pectoris: Secondary | ICD-10-CM | POA: Diagnosis not present

## 2017-04-28 DIAGNOSIS — I5032 Chronic diastolic (congestive) heart failure: Secondary | ICD-10-CM

## 2017-04-28 DIAGNOSIS — I10 Essential (primary) hypertension: Secondary | ICD-10-CM | POA: Diagnosis not present

## 2017-04-28 DIAGNOSIS — I779 Disorder of arteries and arterioles, unspecified: Secondary | ICD-10-CM

## 2017-04-28 DIAGNOSIS — N390 Urinary tract infection, site not specified: Secondary | ICD-10-CM | POA: Diagnosis not present

## 2017-04-28 DIAGNOSIS — M6281 Muscle weakness (generalized): Secondary | ICD-10-CM | POA: Diagnosis not present

## 2017-04-28 DIAGNOSIS — I4510 Unspecified right bundle-branch block: Secondary | ICD-10-CM

## 2017-04-28 DIAGNOSIS — E1143 Type 2 diabetes mellitus with diabetic autonomic (poly)neuropathy: Secondary | ICD-10-CM | POA: Diagnosis not present

## 2017-04-28 DIAGNOSIS — E1149 Type 2 diabetes mellitus with other diabetic neurological complication: Secondary | ICD-10-CM | POA: Diagnosis not present

## 2017-04-28 MED ORDER — AMLODIPINE BESYLATE 10 MG PO TABS: 10.0000 mg | ORAL_TABLET | Freq: Every day | ORAL | 5 refills | Status: AC

## 2017-04-28 NOTE — Patient Instructions (Addendum)
Your physician has recommended you make the following change in your medication:  -- TAKE lasix 20mg  as needed -- INCREASE amlodipine to 10mg  daily  Your physician recommends that you return for lab work TODAY - BMET  Your physician has requested that you have a carotid duplex. This test is an ultrasound of the carotid arteries in your neck. It looks at blood flow through these arteries that supply the brain with blood. Allow one hour for this exam. There are no restrictions or special instructions.  Your physician recommends that you schedule a follow-up appointment in 3-4 months with Dr. Sallyanne Kuster   Heart failure instruction: Avoid salt intake Limit fluid intake to 32-64 oz per day Monitor your weight every week, call cardiology if weight increase by more than 5 lbs in a single week

## 2017-04-28 NOTE — Progress Notes (Signed)
Cardiology Office Note    Date:  04/29/2017   ID:  Brenda, Vaughan 1933/04/13, MRN [id number]  PCP:  Brenda Dubin, MD  Cardiologist:  Dr. Sallyanne Vaughan (former Dr. Mare Vaughan patient)   Chief Complaint  Patient presents with  . Follow-up    seen for Dr. Sallyanne Vaughan    History of Present Illness:  [name] is a 83 y.o. female with PMH of CAD s/p CABG, HLD, RBBB and 1st degree AVB, HTN, DM II, stage III-IV CKD, anemia. She had a history of coronary artery bypass graft surgery following acute myocardial infarction in March 2008. She has exertional dyspnea however no subsequent angina. Last echocardiogram in December 2016 showed EF 59-56%, grade 2 diastolic dysfunction. Last Myoview in April 2013 showed small inferior wall scar, no ischemia, EF 72%. Carotid duplex in December 2016 showed 60-79% right stenosis, 40-59% left ICA stenosis. She received periodic erythropoietin injection for anemia that is felt to be related to renal insufficiency. She is being followed by [name].   She was admitted in May after developing intractable nausea and vomiting shortly after taking medication for UTI. She was also found to have acute decompensated diastolic heart failure. She was diuresed with IV Lasix with improvement. She was later discharged to skilled nursing facility. Echocardiogram obtained on 02/02/2017 showed EF 38-75%, grade 2 diastolic dysfunction.  Patient presents today along with her daughter. On physical exam, she does not appear to be volume overloaded. I have called [name] and compare to her medications, she is no longer on Lasix 20 mg daily as suggested by our medication list. She is in fact on HCTZ 12.5 mg daily. Although recent phone note seems to suggest there was some concern of volume overload by hematology service, daughter actually worries about dehydration. By my physical exam, I do not think she is volume overloaded. She does not have significant lower  extremity edema, her lung is clear on physical exam as well. She denies any recent lower extremity edema, orthopnea or PND. She is due for carotid Doppler. The current dose of hydrochlorothiazide seems to be controlling her volume status quite well. I did give her prescription for Lasix on an as-needed basis if she has increasing shortness of breath or if her weight increased by more than 5 pounds in one week. Otherwise, I'm more worried about her elevated blood pressure, review of the previous office notes suggest her blood pressure has been ranging in the 140s to 180s. I would increase her amlodipine. Family is aware that amlodipine can potentially cause a little more peripheral swelling. I will also obtain a basic metabolic panel as well   Past Medical History:  Diagnosis Date  . Cellulitis    R ARM  . Coronary artery disease 2008   MI, s/p 5v CABG  . Diabetes mellitus ([place])    10 years ago ~age 77  . History of breast cancer 1979  . Hyperlipidemia   . Hypertension   . Hypothyroidism   . Uterine cancer ([place]) 1991    Past Surgical History:  Procedure Laterality Date  . APPENDECTOMY    . CARDIAC CATHETERIZATION    . CORONARY ARTERY BYPASS GRAFT  2008  . HIP FRACTURE SURGERY Right 2017  . MASTECTOMY  1979   right, followed by 2 years chemo  . NUCLEAR STRESS TEST  07/2009   EF 69%,  MILD ISCHEMIA IN INFEROLATERAL WALL  . TOTAL ABDOMINAL HYSTERECTOMY  1993  . TOTAL HIP ARTHROPLASTY  08/2009   right  . US ECHOCARDIOGRAPHY  05/2009   EF 55-60%,MILD-MOD LVH,, MILD-MOD A STENOSIS,    Current Medications: Outpatient Medications Prior to Visit  Medication Sig Dispense Refill  . aspirin 81 MG tablet Take 81 mg by mouth at bedtime.     Marland Kitchen atorvastatin (LIPITOR) 80 MG tablet Take 1 tablet (80 mg total) by mouth daily. 90 tablet 3  . Blood Glucose Monitoring Suppl (ACCU-CHEK AVIVA) device Use to check blood sugar once daily.  Dx: E11.43 1 each 0  . Cyanocobalamin (VITAMIN B-12 CR PO) Take  1 tablet by mouth daily.    Marland Kitchen gabapentin (NEURONTIN) 100 MG capsule Take 2 capsules (200 mg total) by mouth at bedtime. Can increase to 300 mg at bedtime if still having neuropathy. (Patient taking differently: Take 200-300 mg by mouth at bedtime. Take  200mg  daily at bedtime and Can increase to 300 mg at bedtime if still having neuropathy.) 270 capsule 1  . glipiZIDE (GLUCOTROL XL) 5 MG 24 hr tablet TAKE 1 TABLET (5 MG TOTAL) BY MOUTH DAILY WITH BREAKFAST. 30 tablet 5  . glucose blood (ACCU-CHEK AVIVA) test strip Use to check blood sugar once daily.  Dx: E11.43 100 each 3  . IRON PO Take 1 tablet by mouth.    Elmore Guise Devices (ACCU-CHEK SOFTCLIX) lancets Use to check blood sugar once daily.  Dx: E11.43 1 each 0  . levothyroxine (SYNTHROID, LEVOTHROID) 50 MCG tablet TAKE 1 TABLET BY MOUTH EVERY DAY 90 tablet 1  . metoCLOPramide (REGLAN) 5 MG tablet Take 1 tablet (5 mg total) by mouth 3 (three) times daily before meals. 90 tablet 0  . metoprolol succinate (TOPROL-XL) 25 MG 24 hr tablet Take 25 mg by mouth 2 (two) times daily.    . nitroGLYCERIN (NITROSTAT) 0.4 MG SL tablet Place 1 tablet (0.4 mg total) under the tongue every 5 (five) minutes as needed for chest pain (x 3 tabs daily). 25 tablet 3  . pantoprazole (PROTONIX) 20 MG tablet Take 1 tablet (20 mg total) by mouth 2 (two) times daily. 60 tablet 0  . sertraline (ZOLOFT) 25 MG tablet TAKE 1 TABLET (25 MG TOTAL) BY MOUTH DAILY. 30 tablet 5  . Vitamin D, Ergocalciferol, (DRISDOL) 50000 units CAPS capsule TAKE 1 CAPSULE BY MOUTH EVERY 7 DAYS. 4 capsule 2  . amLODipine (NORVASC) 5 MG tablet Take 1 tablet (5 mg total) by mouth daily. 30 tablet 0  . furosemide (LASIX) 20 MG tablet Take 1 tablet (20 mg total) by mouth daily. 30 tablet 11   No facility-administered medications prior to visit.      Allergies:   Levofloxacin; Morphine and related; Sulfa antibiotics; and Tape   Social History   Social History  . Marital status: Divorced    Spouse  name: N/A  . Number of children: N/A  . Years of education: N/A   Occupational History  . retired    Social History Main Topics  . Smoking status: Never Smoker  . Smokeless tobacco: Never Used  . Alcohol use No  . Drug use: No  . Sexual activity: No     Comment: 5 children, 4 daughters and 1 son, retired, active in church   Other Topics Concern  . None   Social History Narrative  . None     Family History:  The patient's family history includes Cancer in her mother; Diabetes in her brother and mother; Heart disease (age of onset: 25) in her father; Throat cancer in  her brother.   ROS:   Please see the history of present illness.    ROS All other systems reviewed and are negative.   PHYSICAL EXAM:   VS:  BP (!) 182/69   Pulse (!) 56   Ht 5\' 3"  (1.6 m)   Wt 178 lb 3.2 oz (80.8 kg)   BMI 31.57 kg/m    GEN: Well nourished, well developed, in no acute distress  HEENT: normal  Neck: no JVD, carotid bruits, or masses Cardiac: RRR; no murmurs, rubs, or gallops,no edema  Respiratory:  clear to auscultation bilaterally, normal work of breathing GI: soft, nontender, nondistended, + BS MS: no deformity or atrophy  Skin: warm and dry, no rash Neuro:  Alert and Oriented x 3, Strength and sensation are intact Psych: euthymic mood, full affect  Wt Readings from Last 3 Encounters:  04/28/17 178 lb 3.2 oz (80.8 kg)  04/18/17 182 lb 3.2 oz (82.6 kg)  03/21/17 180 lb 8 oz (81.9 kg)      Studies/Labs Reviewed:   EKG:  EKG is ordered today.  The ekg ordered today demonstrates Sinus bradycardia, right bundle branch block.  Recent Labs: 01/30/2017: B Natriuretic Peptide 588.7 02/03/2017: ALT 12; Magnesium 1.5 03/21/2017: TSH 0.696 04/18/2017: HGB 11.1; Platelets 150 04/28/2017: BUN 15; Creatinine, Ser 1.27; Potassium 2.6; Sodium 139   Lipid Panel    Component Value Date/Time   CHOL 217 (H) 08/26/2016 1053   TRIG 218.0 (H) 08/26/2016 1053   HDL 43.00 08/26/2016 1053   CHOLHDL 5  08/26/2016 1053   VLDL 43.6 (H) 08/26/2016 1053   LDLCALC 140 (H) 09/16/2015 0412   LDLDIRECT 120.0 08/26/2016 1053    Additional studies/ records that were reviewed today include:    Doppler 09/17/2015 Summary:  - The vertebral arteries appear patent with antegrade flow. - Findings consistent with 60 - 79 percent stenosis (low end of   range) involving the right internal carotid artery. - Findings consistent with 25 - 98 percent stenosis involving the   left internal carotid artery.    Echo 02/02/2017 LV EF: 65% -   70%  Study Conclusions  - Left ventricle: The cavity size was normal. Wall thickness was   normal. Systolic function was vigorous. The estimated ejection   fraction was in the range of 65% to 70%. Wall motion was normal;   there were no regional wall motion abnormalities. Features are   consistent with a pseudonormal left ventricular filling pattern,   with concomitant abnormal relaxation and increased filling   pressure (grade 2 diastolic dysfunction). - Aortic valve: Mildly calcified annulus. Valve area (VTI): 2.2   cm^2. Valve area (Vmax): 2.2 cm^2. Valve area (Vmean): 2.13 cm^2. - Mitral valve: Mildly to moderately calcified annulus. - Right atrium: The atrium was mildly dilated.    ASSESSMENT:    1. Chronic diastolic heart failure ([place])   2. Medication management   3. Bilateral carotid artery disease ([place])   4. Essential hypertension   5. CKD (chronic kidney disease), stage III   6. Coronary artery disease involving coronary bypass graft of native heart without angina pectoris   7. Hyperlipidemia, unspecified hyperlipidemia type   8. RBBB   9. Controlled type 2 diabetes mellitus without complication, without long-term current use of insulin ([facility])      PLAN:  In order of problems listed above:  1. Chronic diastolic heart failure: There were some suspicion for volume overload, daughter however is concern of dehydration. By my physical exam,  she  seems to be euvolemic. I have went over her medication list from [name] and [facility], it seems she is not taking Lasix, instead she is on hydrochlorothiazide 12.5 mg daily. I did give her a as needed dose of Lasix to take if her weight increases by more than 5 pounds over a single week. I will obtain a basic metabolic panel to make sure her renal function and electrolyte is okay.  2. CAD status post CABG: No obvious chest discomfort.  3. CKD stage III: pending BMET  4. Hyperlipidemia: Continue Lipitor 80 mg daily  5. DM 2: Will defer to primary care provider  6. Bilateral carotid artery disease: She is due for repeat, moderate disease bilaterally, worse on the right     Medication Adjustments/Labs and Tests Ordered: Current medicines are reviewed at length with the patient today.  Concerns regarding medicines are outlined above.  Medication changes, Labs and Tests ordered today are listed in the Patient Instructions below. Patient Instructions  Your physician has recommended you make the following change in your medication:  -- TAKE lasix 20mg  as needed -- INCREASE amlodipine to 10mg  daily  Your physician recommends that you return for lab work TODAY - BMET  Your physician has requested that you have a carotid duplex. This test is an ultrasound of the carotid arteries in your neck. It looks at blood flow through these arteries that supply the brain with blood. Allow one hour for this exam. There are no restrictions or special instructions.  Your physician recommends that you schedule a follow-up appointment in 3-4 months with Dr. Sallyanne Vaughan   Heart failure instruction: Avoid salt intake Limit fluid intake to 32-64 oz per day Monitor your weight every week, call cardiology if weight increase by more than 5 lbs in a single week    Hilbert Corrigan, [place]  04/29/2017 11:13 PM    [address], [place], [place]   [address] Phone: [phone]; Fax: [phone]

## 2017-04-29 ENCOUNTER — Telehealth: Payer: Self-pay | Admitting: *Deleted

## 2017-04-29 ENCOUNTER — Encounter: Payer: Self-pay | Admitting: Physician Assistant

## 2017-04-29 DIAGNOSIS — R079 Chest pain, unspecified: Secondary | ICD-10-CM | POA: Diagnosis not present

## 2017-04-29 DIAGNOSIS — E1149 Type 2 diabetes mellitus with other diabetic neurological complication: Secondary | ICD-10-CM | POA: Diagnosis not present

## 2017-04-29 DIAGNOSIS — N183 Chronic kidney disease, stage 3 (moderate): Secondary | ICD-10-CM | POA: Diagnosis not present

## 2017-04-29 DIAGNOSIS — I10 Essential (primary) hypertension: Secondary | ICD-10-CM | POA: Diagnosis not present

## 2017-04-29 DIAGNOSIS — R112 Nausea with vomiting, unspecified: Secondary | ICD-10-CM | POA: Diagnosis not present

## 2017-04-29 DIAGNOSIS — I4510 Unspecified right bundle-branch block: Secondary | ICD-10-CM | POA: Diagnosis not present

## 2017-04-29 DIAGNOSIS — I5033 Acute on chronic diastolic (congestive) heart failure: Secondary | ICD-10-CM | POA: Diagnosis not present

## 2017-04-29 DIAGNOSIS — N390 Urinary tract infection, site not specified: Secondary | ICD-10-CM | POA: Diagnosis not present

## 2017-04-29 DIAGNOSIS — I2510 Atherosclerotic heart disease of native coronary artery without angina pectoris: Secondary | ICD-10-CM | POA: Diagnosis not present

## 2017-04-29 DIAGNOSIS — E1143 Type 2 diabetes mellitus with diabetic autonomic (poly)neuropathy: Secondary | ICD-10-CM | POA: Diagnosis not present

## 2017-04-29 DIAGNOSIS — E785 Hyperlipidemia, unspecified: Secondary | ICD-10-CM | POA: Diagnosis not present

## 2017-04-29 DIAGNOSIS — Z79899 Other long term (current) drug therapy: Secondary | ICD-10-CM

## 2017-04-29 DIAGNOSIS — M6281 Muscle weakness (generalized): Secondary | ICD-10-CM | POA: Diagnosis not present

## 2017-04-29 LAB — BASIC METABOLIC PANEL
BUN/Creatinine Ratio: 12 (ref 12–28)
BUN: 15 mg/dL (ref 8–27)
CO2: 26 mmol/L (ref 20–29)
Calcium: 9 mg/dL (ref 8.7–10.3)
Chloride: 92 mmol/L — ABNORMAL LOW (ref 96–106)
Creatinine, Ser: 1.27 mg/dL — ABNORMAL HIGH (ref 0.57–1.00)
GFR calc non Af Amer: 40 mL/min/{1.73_m2} — ABNORMAL LOW (ref >59)
GFR, EST AFRICAN AMERICAN: 46 mL/min/{1.73_m2} — AB (ref >59)
Glucose: 162 mg/dL — ABNORMAL HIGH (ref 65–99)
POTASSIUM: 2.6 mmol/L — AB (ref 3.5–5.2)
SODIUM: 139 mmol/L (ref 134–144)

## 2017-04-29 MED ORDER — POTASSIUM CHLORIDE ER 20 MEQ PO TBCR: EXTENDED_RELEASE_TABLET | ORAL | 3 refills | Status: AC

## 2017-04-29 NOTE — Telephone Encounter (Signed)
Spoke to daughter, she referred me to call [facility] at [phone] I have spoken w Solmon Ice LPN  To give verbal of recommendations, she voiced affirmation and requests orders to be sent to direct fax at [phone]. Lab orders &Rx printed for Hao's signature. These have been signed and faxed to [facility] along w/ copy of most recent BMET results.

## 2017-04-29 NOTE — Telephone Encounter (Signed)
-----   Message from [place], [place] sent at 04/29/2017  7:33 AM EDT ----- Kidney function stable, but her potassium extremely low at 2.6, please given KCl 40 meq for 3 days before transition to 20 meq daily thereafter, need repeat BMET in 5 days

## 2017-05-02 DIAGNOSIS — N183 Chronic kidney disease, stage 3 (moderate): Secondary | ICD-10-CM | POA: Diagnosis not present

## 2017-05-02 DIAGNOSIS — I10 Essential (primary) hypertension: Secondary | ICD-10-CM | POA: Diagnosis not present

## 2017-05-02 DIAGNOSIS — E1143 Type 2 diabetes mellitus with diabetic autonomic (poly)neuropathy: Secondary | ICD-10-CM | POA: Diagnosis not present

## 2017-05-02 DIAGNOSIS — I4510 Unspecified right bundle-branch block: Secondary | ICD-10-CM | POA: Diagnosis not present

## 2017-05-02 DIAGNOSIS — M6281 Muscle weakness (generalized): Secondary | ICD-10-CM | POA: Diagnosis not present

## 2017-05-02 DIAGNOSIS — N390 Urinary tract infection, site not specified: Secondary | ICD-10-CM | POA: Diagnosis not present

## 2017-05-02 DIAGNOSIS — E1149 Type 2 diabetes mellitus with other diabetic neurological complication: Secondary | ICD-10-CM | POA: Diagnosis not present

## 2017-05-02 DIAGNOSIS — R112 Nausea with vomiting, unspecified: Secondary | ICD-10-CM | POA: Diagnosis not present

## 2017-05-02 DIAGNOSIS — E785 Hyperlipidemia, unspecified: Secondary | ICD-10-CM | POA: Diagnosis not present

## 2017-05-02 DIAGNOSIS — I5033 Acute on chronic diastolic (congestive) heart failure: Secondary | ICD-10-CM | POA: Diagnosis not present

## 2017-05-02 DIAGNOSIS — I2510 Atherosclerotic heart disease of native coronary artery without angina pectoris: Secondary | ICD-10-CM | POA: Diagnosis not present

## 2017-05-02 DIAGNOSIS — R079 Chest pain, unspecified: Secondary | ICD-10-CM | POA: Diagnosis not present

## 2017-05-03 DIAGNOSIS — I2510 Atherosclerotic heart disease of native coronary artery without angina pectoris: Secondary | ICD-10-CM | POA: Diagnosis not present

## 2017-05-03 DIAGNOSIS — N390 Urinary tract infection, site not specified: Secondary | ICD-10-CM | POA: Diagnosis not present

## 2017-05-03 DIAGNOSIS — I10 Essential (primary) hypertension: Secondary | ICD-10-CM | POA: Diagnosis not present

## 2017-05-03 DIAGNOSIS — N183 Chronic kidney disease, stage 3 (moderate): Secondary | ICD-10-CM | POA: Diagnosis not present

## 2017-05-03 DIAGNOSIS — R079 Chest pain, unspecified: Secondary | ICD-10-CM | POA: Diagnosis not present

## 2017-05-03 DIAGNOSIS — I4510 Unspecified right bundle-branch block: Secondary | ICD-10-CM | POA: Diagnosis not present

## 2017-05-03 DIAGNOSIS — E785 Hyperlipidemia, unspecified: Secondary | ICD-10-CM | POA: Diagnosis not present

## 2017-05-03 DIAGNOSIS — I5033 Acute on chronic diastolic (congestive) heart failure: Secondary | ICD-10-CM | POA: Diagnosis not present

## 2017-05-03 DIAGNOSIS — E1143 Type 2 diabetes mellitus with diabetic autonomic (poly)neuropathy: Secondary | ICD-10-CM | POA: Diagnosis not present

## 2017-05-03 DIAGNOSIS — M6281 Muscle weakness (generalized): Secondary | ICD-10-CM | POA: Diagnosis not present

## 2017-05-03 DIAGNOSIS — R112 Nausea with vomiting, unspecified: Secondary | ICD-10-CM | POA: Diagnosis not present

## 2017-05-03 DIAGNOSIS — Z79899 Other long term (current) drug therapy: Secondary | ICD-10-CM | POA: Diagnosis not present

## 2017-05-03 DIAGNOSIS — E1149 Type 2 diabetes mellitus with other diabetic neurological complication: Secondary | ICD-10-CM | POA: Diagnosis not present

## 2017-05-04 DIAGNOSIS — I10 Essential (primary) hypertension: Secondary | ICD-10-CM | POA: Diagnosis not present

## 2017-05-04 DIAGNOSIS — E785 Hyperlipidemia, unspecified: Secondary | ICD-10-CM | POA: Diagnosis not present

## 2017-05-04 DIAGNOSIS — I4510 Unspecified right bundle-branch block: Secondary | ICD-10-CM | POA: Diagnosis not present

## 2017-05-04 DIAGNOSIS — I5033 Acute on chronic diastolic (congestive) heart failure: Secondary | ICD-10-CM | POA: Diagnosis not present

## 2017-05-04 DIAGNOSIS — I2510 Atherosclerotic heart disease of native coronary artery without angina pectoris: Secondary | ICD-10-CM | POA: Diagnosis not present

## 2017-05-04 DIAGNOSIS — R112 Nausea with vomiting, unspecified: Secondary | ICD-10-CM | POA: Diagnosis not present

## 2017-05-04 DIAGNOSIS — E1149 Type 2 diabetes mellitus with other diabetic neurological complication: Secondary | ICD-10-CM | POA: Diagnosis not present

## 2017-05-04 DIAGNOSIS — R079 Chest pain, unspecified: Secondary | ICD-10-CM | POA: Diagnosis not present

## 2017-05-04 DIAGNOSIS — M6281 Muscle weakness (generalized): Secondary | ICD-10-CM | POA: Diagnosis not present

## 2017-05-04 DIAGNOSIS — N183 Chronic kidney disease, stage 3 (moderate): Secondary | ICD-10-CM | POA: Diagnosis not present

## 2017-05-04 DIAGNOSIS — N390 Urinary tract infection, site not specified: Secondary | ICD-10-CM | POA: Diagnosis not present

## 2017-05-04 DIAGNOSIS — E1143 Type 2 diabetes mellitus with diabetic autonomic (poly)neuropathy: Secondary | ICD-10-CM | POA: Diagnosis not present

## 2017-05-05 DIAGNOSIS — R079 Chest pain, unspecified: Secondary | ICD-10-CM | POA: Diagnosis not present

## 2017-05-05 DIAGNOSIS — E785 Hyperlipidemia, unspecified: Secondary | ICD-10-CM | POA: Diagnosis not present

## 2017-05-05 DIAGNOSIS — E1143 Type 2 diabetes mellitus with diabetic autonomic (poly)neuropathy: Secondary | ICD-10-CM | POA: Diagnosis not present

## 2017-05-05 DIAGNOSIS — R112 Nausea with vomiting, unspecified: Secondary | ICD-10-CM | POA: Diagnosis not present

## 2017-05-05 DIAGNOSIS — I5033 Acute on chronic diastolic (congestive) heart failure: Secondary | ICD-10-CM | POA: Diagnosis not present

## 2017-05-05 DIAGNOSIS — I10 Essential (primary) hypertension: Secondary | ICD-10-CM | POA: Diagnosis not present

## 2017-05-05 DIAGNOSIS — N390 Urinary tract infection, site not specified: Secondary | ICD-10-CM | POA: Diagnosis not present

## 2017-05-05 DIAGNOSIS — E1149 Type 2 diabetes mellitus with other diabetic neurological complication: Secondary | ICD-10-CM | POA: Diagnosis not present

## 2017-05-05 DIAGNOSIS — N183 Chronic kidney disease, stage 3 (moderate): Secondary | ICD-10-CM | POA: Diagnosis not present

## 2017-05-05 DIAGNOSIS — M6281 Muscle weakness (generalized): Secondary | ICD-10-CM | POA: Diagnosis not present

## 2017-05-05 DIAGNOSIS — I2510 Atherosclerotic heart disease of native coronary artery without angina pectoris: Secondary | ICD-10-CM | POA: Diagnosis not present

## 2017-05-05 DIAGNOSIS — I4510 Unspecified right bundle-branch block: Secondary | ICD-10-CM | POA: Diagnosis not present

## 2017-05-06 DIAGNOSIS — I5033 Acute on chronic diastolic (congestive) heart failure: Secondary | ICD-10-CM | POA: Diagnosis not present

## 2017-05-06 DIAGNOSIS — I2510 Atherosclerotic heart disease of native coronary artery without angina pectoris: Secondary | ICD-10-CM | POA: Diagnosis not present

## 2017-05-06 DIAGNOSIS — E1143 Type 2 diabetes mellitus with diabetic autonomic (poly)neuropathy: Secondary | ICD-10-CM | POA: Diagnosis not present

## 2017-05-06 DIAGNOSIS — I10 Essential (primary) hypertension: Secondary | ICD-10-CM | POA: Diagnosis not present

## 2017-05-06 DIAGNOSIS — E1149 Type 2 diabetes mellitus with other diabetic neurological complication: Secondary | ICD-10-CM | POA: Diagnosis not present

## 2017-05-06 DIAGNOSIS — R112 Nausea with vomiting, unspecified: Secondary | ICD-10-CM | POA: Diagnosis not present

## 2017-05-06 DIAGNOSIS — N183 Chronic kidney disease, stage 3 (moderate): Secondary | ICD-10-CM | POA: Diagnosis not present

## 2017-05-06 DIAGNOSIS — N390 Urinary tract infection, site not specified: Secondary | ICD-10-CM | POA: Diagnosis not present

## 2017-05-06 DIAGNOSIS — R079 Chest pain, unspecified: Secondary | ICD-10-CM | POA: Diagnosis not present

## 2017-05-06 DIAGNOSIS — I4510 Unspecified right bundle-branch block: Secondary | ICD-10-CM | POA: Diagnosis not present

## 2017-05-06 DIAGNOSIS — M6281 Muscle weakness (generalized): Secondary | ICD-10-CM | POA: Diagnosis not present

## 2017-05-06 DIAGNOSIS — E785 Hyperlipidemia, unspecified: Secondary | ICD-10-CM | POA: Diagnosis not present

## 2017-05-07 DIAGNOSIS — N183 Chronic kidney disease, stage 3 (moderate): Secondary | ICD-10-CM | POA: Diagnosis not present

## 2017-05-07 DIAGNOSIS — I10 Essential (primary) hypertension: Secondary | ICD-10-CM | POA: Diagnosis not present

## 2017-05-07 DIAGNOSIS — R112 Nausea with vomiting, unspecified: Secondary | ICD-10-CM | POA: Diagnosis not present

## 2017-05-07 DIAGNOSIS — R079 Chest pain, unspecified: Secondary | ICD-10-CM | POA: Diagnosis not present

## 2017-05-07 DIAGNOSIS — I2510 Atherosclerotic heart disease of native coronary artery without angina pectoris: Secondary | ICD-10-CM | POA: Diagnosis not present

## 2017-05-07 DIAGNOSIS — I5033 Acute on chronic diastolic (congestive) heart failure: Secondary | ICD-10-CM | POA: Diagnosis not present

## 2017-05-07 DIAGNOSIS — E1143 Type 2 diabetes mellitus with diabetic autonomic (poly)neuropathy: Secondary | ICD-10-CM | POA: Diagnosis not present

## 2017-05-07 DIAGNOSIS — E785 Hyperlipidemia, unspecified: Secondary | ICD-10-CM | POA: Diagnosis not present

## 2017-05-07 DIAGNOSIS — M6281 Muscle weakness (generalized): Secondary | ICD-10-CM | POA: Diagnosis not present

## 2017-05-07 DIAGNOSIS — I4510 Unspecified right bundle-branch block: Secondary | ICD-10-CM | POA: Diagnosis not present

## 2017-05-07 DIAGNOSIS — N390 Urinary tract infection, site not specified: Secondary | ICD-10-CM | POA: Diagnosis not present

## 2017-05-07 DIAGNOSIS — E1149 Type 2 diabetes mellitus with other diabetic neurological complication: Secondary | ICD-10-CM | POA: Diagnosis not present

## 2017-05-09 ENCOUNTER — Ambulatory Visit: Payer: Medicare Other

## 2017-05-09 ENCOUNTER — Other Ambulatory Visit (HOSPITAL_BASED_OUTPATIENT_CLINIC_OR_DEPARTMENT_OTHER): Payer: Medicare Other

## 2017-05-09 DIAGNOSIS — D631 Anemia in chronic kidney disease: Secondary | ICD-10-CM | POA: Diagnosis not present

## 2017-05-09 DIAGNOSIS — N189 Chronic kidney disease, unspecified: Secondary | ICD-10-CM | POA: Diagnosis not present

## 2017-05-09 DIAGNOSIS — N184 Chronic kidney disease, stage 4 (severe): Principal | ICD-10-CM

## 2017-05-09 DIAGNOSIS — N179 Acute kidney failure, unspecified: Secondary | ICD-10-CM

## 2017-05-09 LAB — CBC WITH DIFFERENTIAL/PLATELET
BASO%: 0.7 % (ref 0.0–2.0)
Basophils Absolute: 0.1 10*3/uL (ref 0.0–0.1)
EOS%: 5.3 % (ref 0.0–7.0)
Eosinophils Absolute: 0.5 10*3/uL (ref 0.0–0.5)
HCT: 35 % (ref 34.8–46.6)
HGB: 11.8 g/dL (ref 11.6–15.9)
LYMPH%: 17.3 % (ref 14.0–49.7)
MCH: 28.7 pg (ref 25.1–34.0)
MCHC: 33.7 g/dL (ref 31.5–36.0)
MCV: 85.2 fL (ref 79.5–101.0)
MONO#: 0.7 10*3/uL (ref 0.1–0.9)
MONO%: 6.7 % (ref 0.0–14.0)
NEUT#: 7 10*3/uL — ABNORMAL HIGH (ref 1.5–6.5)
NEUT%: 70 % (ref 38.4–76.8)
Platelets: 196 10*3/uL (ref 145–400)
RBC: 4.11 10*6/uL (ref 3.70–5.45)
RDW: 15.4 % — ABNORMAL HIGH (ref 11.2–14.5)
WBC: 10 10*3/uL (ref 3.9–10.3)
lymph#: 1.7 10*3/uL (ref 0.9–3.3)

## 2017-05-09 NOTE — Progress Notes (Signed)
Patient met in the lobby with a copy of lab results. Patient does not need Aranesp today per order guidelines. Patient provided lab results and confirms next weeks appointment time.

## 2017-05-10 DIAGNOSIS — E1143 Type 2 diabetes mellitus with diabetic autonomic (poly)neuropathy: Secondary | ICD-10-CM | POA: Diagnosis not present

## 2017-05-10 DIAGNOSIS — E1022 Type 1 diabetes mellitus with diabetic chronic kidney disease: Secondary | ICD-10-CM | POA: Diagnosis not present

## 2017-05-10 DIAGNOSIS — M6281 Muscle weakness (generalized): Secondary | ICD-10-CM | POA: Diagnosis not present

## 2017-05-10 DIAGNOSIS — N390 Urinary tract infection, site not specified: Secondary | ICD-10-CM | POA: Diagnosis not present

## 2017-05-10 DIAGNOSIS — R079 Chest pain, unspecified: Secondary | ICD-10-CM | POA: Diagnosis not present

## 2017-05-10 DIAGNOSIS — R112 Nausea with vomiting, unspecified: Secondary | ICD-10-CM | POA: Diagnosis not present

## 2017-05-10 DIAGNOSIS — E785 Hyperlipidemia, unspecified: Secondary | ICD-10-CM | POA: Diagnosis not present

## 2017-05-10 DIAGNOSIS — E1149 Type 2 diabetes mellitus with other diabetic neurological complication: Secondary | ICD-10-CM | POA: Diagnosis not present

## 2017-05-10 DIAGNOSIS — I4510 Unspecified right bundle-branch block: Secondary | ICD-10-CM | POA: Diagnosis not present

## 2017-05-10 DIAGNOSIS — I2510 Atherosclerotic heart disease of native coronary artery without angina pectoris: Secondary | ICD-10-CM | POA: Diagnosis not present

## 2017-05-10 DIAGNOSIS — I10 Essential (primary) hypertension: Secondary | ICD-10-CM | POA: Diagnosis not present

## 2017-05-10 DIAGNOSIS — N183 Chronic kidney disease, stage 3 (moderate): Secondary | ICD-10-CM | POA: Diagnosis not present

## 2017-05-10 DIAGNOSIS — Z79899 Other long term (current) drug therapy: Secondary | ICD-10-CM | POA: Diagnosis not present

## 2017-05-10 DIAGNOSIS — I5033 Acute on chronic diastolic (congestive) heart failure: Secondary | ICD-10-CM | POA: Diagnosis not present

## 2017-05-11 DIAGNOSIS — I5033 Acute on chronic diastolic (congestive) heart failure: Secondary | ICD-10-CM | POA: Diagnosis not present

## 2017-05-11 DIAGNOSIS — N183 Chronic kidney disease, stage 3 (moderate): Secondary | ICD-10-CM | POA: Diagnosis not present

## 2017-05-11 DIAGNOSIS — E1143 Type 2 diabetes mellitus with diabetic autonomic (poly)neuropathy: Secondary | ICD-10-CM | POA: Diagnosis not present

## 2017-05-11 DIAGNOSIS — R112 Nausea with vomiting, unspecified: Secondary | ICD-10-CM | POA: Diagnosis not present

## 2017-05-11 DIAGNOSIS — R079 Chest pain, unspecified: Secondary | ICD-10-CM | POA: Diagnosis not present

## 2017-05-11 DIAGNOSIS — I10 Essential (primary) hypertension: Secondary | ICD-10-CM | POA: Diagnosis not present

## 2017-05-11 DIAGNOSIS — I4510 Unspecified right bundle-branch block: Secondary | ICD-10-CM | POA: Diagnosis not present

## 2017-05-11 DIAGNOSIS — E1149 Type 2 diabetes mellitus with other diabetic neurological complication: Secondary | ICD-10-CM | POA: Diagnosis not present

## 2017-05-11 DIAGNOSIS — E785 Hyperlipidemia, unspecified: Secondary | ICD-10-CM | POA: Diagnosis not present

## 2017-05-11 DIAGNOSIS — I2510 Atherosclerotic heart disease of native coronary artery without angina pectoris: Secondary | ICD-10-CM | POA: Diagnosis not present

## 2017-05-11 DIAGNOSIS — N390 Urinary tract infection, site not specified: Secondary | ICD-10-CM | POA: Diagnosis not present

## 2017-05-11 DIAGNOSIS — M6281 Muscle weakness (generalized): Secondary | ICD-10-CM | POA: Diagnosis not present

## 2017-05-12 ENCOUNTER — Ambulatory Visit (HOSPITAL_COMMUNITY)
Admission: RE | Admit: 2017-05-12 | Discharge: 2017-05-12 | Disposition: A | Discharge status: Home / Self Care | Payer: Medicare Other | Source: Ambulatory Visit | Attending: Cardiology | Admitting: Cardiology

## 2017-05-12 DIAGNOSIS — I10 Essential (primary) hypertension: Secondary | ICD-10-CM | POA: Diagnosis not present

## 2017-05-12 DIAGNOSIS — I6523 Occlusion and stenosis of bilateral carotid arteries: Secondary | ICD-10-CM | POA: Insufficient documentation

## 2017-05-12 DIAGNOSIS — N183 Chronic kidney disease, stage 3 (moderate): Secondary | ICD-10-CM | POA: Diagnosis not present

## 2017-05-12 DIAGNOSIS — R112 Nausea with vomiting, unspecified: Secondary | ICD-10-CM | POA: Diagnosis not present

## 2017-05-12 DIAGNOSIS — E785 Hyperlipidemia, unspecified: Secondary | ICD-10-CM | POA: Diagnosis not present

## 2017-05-12 DIAGNOSIS — I4510 Unspecified right bundle-branch block: Secondary | ICD-10-CM | POA: Diagnosis not present

## 2017-05-12 DIAGNOSIS — I779 Disorder of arteries and arterioles, unspecified: Secondary | ICD-10-CM

## 2017-05-12 DIAGNOSIS — I5033 Acute on chronic diastolic (congestive) heart failure: Secondary | ICD-10-CM | POA: Diagnosis not present

## 2017-05-12 DIAGNOSIS — R079 Chest pain, unspecified: Secondary | ICD-10-CM | POA: Diagnosis not present

## 2017-05-12 DIAGNOSIS — N390 Urinary tract infection, site not specified: Secondary | ICD-10-CM | POA: Diagnosis not present

## 2017-05-12 DIAGNOSIS — I2510 Atherosclerotic heart disease of native coronary artery without angina pectoris: Secondary | ICD-10-CM | POA: Diagnosis not present

## 2017-05-12 DIAGNOSIS — M6281 Muscle weakness (generalized): Secondary | ICD-10-CM | POA: Diagnosis not present

## 2017-05-12 DIAGNOSIS — E1143 Type 2 diabetes mellitus with diabetic autonomic (poly)neuropathy: Secondary | ICD-10-CM | POA: Diagnosis not present

## 2017-05-12 DIAGNOSIS — I739 Peripheral vascular disease, unspecified: Secondary | ICD-10-CM

## 2017-05-12 DIAGNOSIS — E1149 Type 2 diabetes mellitus with other diabetic neurological complication: Secondary | ICD-10-CM | POA: Diagnosis not present

## 2017-05-13 DIAGNOSIS — R079 Chest pain, unspecified: Secondary | ICD-10-CM | POA: Diagnosis not present

## 2017-05-13 DIAGNOSIS — N390 Urinary tract infection, site not specified: Secondary | ICD-10-CM | POA: Diagnosis not present

## 2017-05-13 DIAGNOSIS — I2510 Atherosclerotic heart disease of native coronary artery without angina pectoris: Secondary | ICD-10-CM | POA: Diagnosis not present

## 2017-05-13 DIAGNOSIS — M6281 Muscle weakness (generalized): Secondary | ICD-10-CM | POA: Diagnosis not present

## 2017-05-13 DIAGNOSIS — E785 Hyperlipidemia, unspecified: Secondary | ICD-10-CM | POA: Diagnosis not present

## 2017-05-13 DIAGNOSIS — E1143 Type 2 diabetes mellitus with diabetic autonomic (poly)neuropathy: Secondary | ICD-10-CM | POA: Diagnosis not present

## 2017-05-13 DIAGNOSIS — R112 Nausea with vomiting, unspecified: Secondary | ICD-10-CM | POA: Diagnosis not present

## 2017-05-13 DIAGNOSIS — I4510 Unspecified right bundle-branch block: Secondary | ICD-10-CM | POA: Diagnosis not present

## 2017-05-13 DIAGNOSIS — I5033 Acute on chronic diastolic (congestive) heart failure: Secondary | ICD-10-CM | POA: Diagnosis not present

## 2017-05-13 DIAGNOSIS — D649 Anemia, unspecified: Secondary | ICD-10-CM | POA: Diagnosis not present

## 2017-05-13 DIAGNOSIS — N183 Chronic kidney disease, stage 3 (moderate): Secondary | ICD-10-CM | POA: Diagnosis not present

## 2017-05-13 DIAGNOSIS — I10 Essential (primary) hypertension: Secondary | ICD-10-CM | POA: Diagnosis not present

## 2017-05-13 DIAGNOSIS — E1149 Type 2 diabetes mellitus with other diabetic neurological complication: Secondary | ICD-10-CM | POA: Diagnosis not present

## 2017-05-16 ENCOUNTER — Encounter: Payer: Self-pay | Admitting: *Deleted

## 2017-05-16 DIAGNOSIS — I5033 Acute on chronic diastolic (congestive) heart failure: Secondary | ICD-10-CM | POA: Diagnosis not present

## 2017-05-16 DIAGNOSIS — I4510 Unspecified right bundle-branch block: Secondary | ICD-10-CM | POA: Diagnosis not present

## 2017-05-16 DIAGNOSIS — E1143 Type 2 diabetes mellitus with diabetic autonomic (poly)neuropathy: Secondary | ICD-10-CM | POA: Diagnosis not present

## 2017-05-16 DIAGNOSIS — I2510 Atherosclerotic heart disease of native coronary artery without angina pectoris: Secondary | ICD-10-CM | POA: Diagnosis not present

## 2017-05-16 DIAGNOSIS — M6281 Muscle weakness (generalized): Secondary | ICD-10-CM | POA: Diagnosis not present

## 2017-05-16 DIAGNOSIS — E1149 Type 2 diabetes mellitus with other diabetic neurological complication: Secondary | ICD-10-CM | POA: Diagnosis not present

## 2017-05-16 DIAGNOSIS — N183 Chronic kidney disease, stage 3 (moderate): Secondary | ICD-10-CM | POA: Diagnosis not present

## 2017-05-16 DIAGNOSIS — E785 Hyperlipidemia, unspecified: Secondary | ICD-10-CM | POA: Diagnosis not present

## 2017-05-16 DIAGNOSIS — I10 Essential (primary) hypertension: Secondary | ICD-10-CM | POA: Diagnosis not present

## 2017-05-16 DIAGNOSIS — R112 Nausea with vomiting, unspecified: Secondary | ICD-10-CM | POA: Diagnosis not present

## 2017-05-16 DIAGNOSIS — R079 Chest pain, unspecified: Secondary | ICD-10-CM | POA: Diagnosis not present

## 2017-05-16 DIAGNOSIS — N390 Urinary tract infection, site not specified: Secondary | ICD-10-CM | POA: Diagnosis not present

## 2017-05-17 DIAGNOSIS — I5033 Acute on chronic diastolic (congestive) heart failure: Secondary | ICD-10-CM | POA: Diagnosis not present

## 2017-05-17 DIAGNOSIS — E785 Hyperlipidemia, unspecified: Secondary | ICD-10-CM | POA: Diagnosis not present

## 2017-05-17 DIAGNOSIS — E1149 Type 2 diabetes mellitus with other diabetic neurological complication: Secondary | ICD-10-CM | POA: Diagnosis not present

## 2017-05-17 DIAGNOSIS — I4510 Unspecified right bundle-branch block: Secondary | ICD-10-CM | POA: Diagnosis not present

## 2017-05-17 DIAGNOSIS — N390 Urinary tract infection, site not specified: Secondary | ICD-10-CM | POA: Diagnosis not present

## 2017-05-17 DIAGNOSIS — R112 Nausea with vomiting, unspecified: Secondary | ICD-10-CM | POA: Diagnosis not present

## 2017-05-17 DIAGNOSIS — M6281 Muscle weakness (generalized): Secondary | ICD-10-CM | POA: Diagnosis not present

## 2017-05-17 DIAGNOSIS — E1143 Type 2 diabetes mellitus with diabetic autonomic (poly)neuropathy: Secondary | ICD-10-CM | POA: Diagnosis not present

## 2017-05-17 DIAGNOSIS — N183 Chronic kidney disease, stage 3 (moderate): Secondary | ICD-10-CM | POA: Diagnosis not present

## 2017-05-17 DIAGNOSIS — I2510 Atherosclerotic heart disease of native coronary artery without angina pectoris: Secondary | ICD-10-CM | POA: Diagnosis not present

## 2017-05-17 DIAGNOSIS — R079 Chest pain, unspecified: Secondary | ICD-10-CM | POA: Diagnosis not present

## 2017-05-17 DIAGNOSIS — I10 Essential (primary) hypertension: Secondary | ICD-10-CM | POA: Diagnosis not present

## 2017-05-18 DIAGNOSIS — E1149 Type 2 diabetes mellitus with other diabetic neurological complication: Secondary | ICD-10-CM | POA: Diagnosis not present

## 2017-05-18 DIAGNOSIS — N183 Chronic kidney disease, stage 3 (moderate): Secondary | ICD-10-CM | POA: Diagnosis not present

## 2017-05-18 DIAGNOSIS — M6281 Muscle weakness (generalized): Secondary | ICD-10-CM | POA: Diagnosis not present

## 2017-05-18 DIAGNOSIS — I10 Essential (primary) hypertension: Secondary | ICD-10-CM | POA: Diagnosis not present

## 2017-05-18 DIAGNOSIS — R079 Chest pain, unspecified: Secondary | ICD-10-CM | POA: Diagnosis not present

## 2017-05-18 DIAGNOSIS — R112 Nausea with vomiting, unspecified: Secondary | ICD-10-CM | POA: Diagnosis not present

## 2017-05-18 DIAGNOSIS — I5033 Acute on chronic diastolic (congestive) heart failure: Secondary | ICD-10-CM | POA: Diagnosis not present

## 2017-05-18 DIAGNOSIS — I2510 Atherosclerotic heart disease of native coronary artery without angina pectoris: Secondary | ICD-10-CM | POA: Diagnosis not present

## 2017-05-18 DIAGNOSIS — E1143 Type 2 diabetes mellitus with diabetic autonomic (poly)neuropathy: Secondary | ICD-10-CM | POA: Diagnosis not present

## 2017-05-18 DIAGNOSIS — N390 Urinary tract infection, site not specified: Secondary | ICD-10-CM | POA: Diagnosis not present

## 2017-05-18 DIAGNOSIS — E785 Hyperlipidemia, unspecified: Secondary | ICD-10-CM | POA: Diagnosis not present

## 2017-05-18 DIAGNOSIS — I4510 Unspecified right bundle-branch block: Secondary | ICD-10-CM | POA: Diagnosis not present

## 2017-05-19 DIAGNOSIS — N390 Urinary tract infection, site not specified: Secondary | ICD-10-CM | POA: Diagnosis not present

## 2017-05-19 DIAGNOSIS — I2510 Atherosclerotic heart disease of native coronary artery without angina pectoris: Secondary | ICD-10-CM | POA: Diagnosis not present

## 2017-05-19 DIAGNOSIS — I4510 Unspecified right bundle-branch block: Secondary | ICD-10-CM | POA: Diagnosis not present

## 2017-05-19 DIAGNOSIS — I5033 Acute on chronic diastolic (congestive) heart failure: Secondary | ICD-10-CM | POA: Diagnosis not present

## 2017-05-19 DIAGNOSIS — R112 Nausea with vomiting, unspecified: Secondary | ICD-10-CM | POA: Diagnosis not present

## 2017-05-19 DIAGNOSIS — E1149 Type 2 diabetes mellitus with other diabetic neurological complication: Secondary | ICD-10-CM | POA: Diagnosis not present

## 2017-05-19 DIAGNOSIS — N183 Chronic kidney disease, stage 3 (moderate): Secondary | ICD-10-CM | POA: Diagnosis not present

## 2017-05-19 DIAGNOSIS — R079 Chest pain, unspecified: Secondary | ICD-10-CM | POA: Diagnosis not present

## 2017-05-19 DIAGNOSIS — M6281 Muscle weakness (generalized): Secondary | ICD-10-CM | POA: Diagnosis not present

## 2017-05-19 DIAGNOSIS — E1143 Type 2 diabetes mellitus with diabetic autonomic (poly)neuropathy: Secondary | ICD-10-CM | POA: Diagnosis not present

## 2017-05-19 DIAGNOSIS — I10 Essential (primary) hypertension: Secondary | ICD-10-CM | POA: Diagnosis not present

## 2017-05-19 DIAGNOSIS — E785 Hyperlipidemia, unspecified: Secondary | ICD-10-CM | POA: Diagnosis not present

## 2017-05-20 DIAGNOSIS — E1149 Type 2 diabetes mellitus with other diabetic neurological complication: Secondary | ICD-10-CM | POA: Diagnosis not present

## 2017-05-20 DIAGNOSIS — I5033 Acute on chronic diastolic (congestive) heart failure: Secondary | ICD-10-CM | POA: Diagnosis not present

## 2017-05-20 DIAGNOSIS — M6281 Muscle weakness (generalized): Secondary | ICD-10-CM | POA: Diagnosis not present

## 2017-05-20 DIAGNOSIS — R112 Nausea with vomiting, unspecified: Secondary | ICD-10-CM | POA: Diagnosis not present

## 2017-05-20 DIAGNOSIS — I2510 Atherosclerotic heart disease of native coronary artery without angina pectoris: Secondary | ICD-10-CM | POA: Diagnosis not present

## 2017-05-20 DIAGNOSIS — I10 Essential (primary) hypertension: Secondary | ICD-10-CM | POA: Diagnosis not present

## 2017-05-20 DIAGNOSIS — N390 Urinary tract infection, site not specified: Secondary | ICD-10-CM | POA: Diagnosis not present

## 2017-05-20 DIAGNOSIS — E1143 Type 2 diabetes mellitus with diabetic autonomic (poly)neuropathy: Secondary | ICD-10-CM | POA: Diagnosis not present

## 2017-05-20 DIAGNOSIS — E785 Hyperlipidemia, unspecified: Secondary | ICD-10-CM | POA: Diagnosis not present

## 2017-05-20 DIAGNOSIS — N183 Chronic kidney disease, stage 3 (moderate): Secondary | ICD-10-CM | POA: Diagnosis not present

## 2017-05-20 DIAGNOSIS — R079 Chest pain, unspecified: Secondary | ICD-10-CM | POA: Diagnosis not present

## 2017-05-20 DIAGNOSIS — I4510 Unspecified right bundle-branch block: Secondary | ICD-10-CM | POA: Diagnosis not present

## 2017-05-23 DIAGNOSIS — N183 Chronic kidney disease, stage 3 (moderate): Secondary | ICD-10-CM | POA: Diagnosis not present

## 2017-05-23 DIAGNOSIS — I4510 Unspecified right bundle-branch block: Secondary | ICD-10-CM | POA: Diagnosis not present

## 2017-05-23 DIAGNOSIS — M6281 Muscle weakness (generalized): Secondary | ICD-10-CM | POA: Diagnosis not present

## 2017-05-23 DIAGNOSIS — I2510 Atherosclerotic heart disease of native coronary artery without angina pectoris: Secondary | ICD-10-CM | POA: Diagnosis not present

## 2017-05-23 DIAGNOSIS — I5033 Acute on chronic diastolic (congestive) heart failure: Secondary | ICD-10-CM | POA: Diagnosis not present

## 2017-05-23 DIAGNOSIS — E1143 Type 2 diabetes mellitus with diabetic autonomic (poly)neuropathy: Secondary | ICD-10-CM | POA: Diagnosis not present

## 2017-05-23 DIAGNOSIS — I10 Essential (primary) hypertension: Secondary | ICD-10-CM | POA: Diagnosis not present

## 2017-05-23 DIAGNOSIS — E1149 Type 2 diabetes mellitus with other diabetic neurological complication: Secondary | ICD-10-CM | POA: Diagnosis not present

## 2017-05-23 DIAGNOSIS — R079 Chest pain, unspecified: Secondary | ICD-10-CM | POA: Diagnosis not present

## 2017-05-23 DIAGNOSIS — R112 Nausea with vomiting, unspecified: Secondary | ICD-10-CM | POA: Diagnosis not present

## 2017-05-23 DIAGNOSIS — E785 Hyperlipidemia, unspecified: Secondary | ICD-10-CM | POA: Diagnosis not present

## 2017-05-23 DIAGNOSIS — N390 Urinary tract infection, site not specified: Secondary | ICD-10-CM | POA: Diagnosis not present

## 2017-05-24 DIAGNOSIS — E785 Hyperlipidemia, unspecified: Secondary | ICD-10-CM | POA: Diagnosis not present

## 2017-05-24 DIAGNOSIS — I4510 Unspecified right bundle-branch block: Secondary | ICD-10-CM | POA: Diagnosis not present

## 2017-05-24 DIAGNOSIS — E1143 Type 2 diabetes mellitus with diabetic autonomic (poly)neuropathy: Secondary | ICD-10-CM | POA: Diagnosis not present

## 2017-05-24 DIAGNOSIS — I5033 Acute on chronic diastolic (congestive) heart failure: Secondary | ICD-10-CM | POA: Diagnosis not present

## 2017-05-24 DIAGNOSIS — N390 Urinary tract infection, site not specified: Secondary | ICD-10-CM | POA: Diagnosis not present

## 2017-05-24 DIAGNOSIS — R079 Chest pain, unspecified: Secondary | ICD-10-CM | POA: Diagnosis not present

## 2017-05-24 DIAGNOSIS — Z79899 Other long term (current) drug therapy: Secondary | ICD-10-CM | POA: Diagnosis not present

## 2017-05-24 DIAGNOSIS — I10 Essential (primary) hypertension: Secondary | ICD-10-CM | POA: Diagnosis not present

## 2017-05-24 DIAGNOSIS — E1022 Type 1 diabetes mellitus with diabetic chronic kidney disease: Secondary | ICD-10-CM | POA: Diagnosis not present

## 2017-05-24 DIAGNOSIS — N183 Chronic kidney disease, stage 3 (moderate): Secondary | ICD-10-CM | POA: Diagnosis not present

## 2017-05-24 DIAGNOSIS — R112 Nausea with vomiting, unspecified: Secondary | ICD-10-CM | POA: Diagnosis not present

## 2017-05-24 DIAGNOSIS — I2510 Atherosclerotic heart disease of native coronary artery without angina pectoris: Secondary | ICD-10-CM | POA: Diagnosis not present

## 2017-05-24 DIAGNOSIS — E1149 Type 2 diabetes mellitus with other diabetic neurological complication: Secondary | ICD-10-CM | POA: Diagnosis not present

## 2017-05-24 DIAGNOSIS — M6281 Muscle weakness (generalized): Secondary | ICD-10-CM | POA: Diagnosis not present

## 2017-05-25 DIAGNOSIS — I10 Essential (primary) hypertension: Secondary | ICD-10-CM | POA: Diagnosis not present

## 2017-05-25 DIAGNOSIS — M6281 Muscle weakness (generalized): Secondary | ICD-10-CM | POA: Diagnosis not present

## 2017-05-25 DIAGNOSIS — I4510 Unspecified right bundle-branch block: Secondary | ICD-10-CM | POA: Diagnosis not present

## 2017-05-25 DIAGNOSIS — I2510 Atherosclerotic heart disease of native coronary artery without angina pectoris: Secondary | ICD-10-CM | POA: Diagnosis not present

## 2017-05-25 DIAGNOSIS — N390 Urinary tract infection, site not specified: Secondary | ICD-10-CM | POA: Diagnosis not present

## 2017-05-25 DIAGNOSIS — R112 Nausea with vomiting, unspecified: Secondary | ICD-10-CM | POA: Diagnosis not present

## 2017-05-25 DIAGNOSIS — E1143 Type 2 diabetes mellitus with diabetic autonomic (poly)neuropathy: Secondary | ICD-10-CM | POA: Diagnosis not present

## 2017-05-25 DIAGNOSIS — E785 Hyperlipidemia, unspecified: Secondary | ICD-10-CM | POA: Diagnosis not present

## 2017-05-25 DIAGNOSIS — R079 Chest pain, unspecified: Secondary | ICD-10-CM | POA: Diagnosis not present

## 2017-05-25 DIAGNOSIS — N183 Chronic kidney disease, stage 3 (moderate): Secondary | ICD-10-CM | POA: Diagnosis not present

## 2017-05-25 DIAGNOSIS — I5033 Acute on chronic diastolic (congestive) heart failure: Secondary | ICD-10-CM | POA: Diagnosis not present

## 2017-05-25 DIAGNOSIS — E1149 Type 2 diabetes mellitus with other diabetic neurological complication: Secondary | ICD-10-CM | POA: Diagnosis not present

## 2017-05-26 DIAGNOSIS — E1143 Type 2 diabetes mellitus with diabetic autonomic (poly)neuropathy: Secondary | ICD-10-CM | POA: Diagnosis not present

## 2017-05-26 DIAGNOSIS — E785 Hyperlipidemia, unspecified: Secondary | ICD-10-CM | POA: Diagnosis not present

## 2017-05-26 DIAGNOSIS — N183 Chronic kidney disease, stage 3 (moderate): Secondary | ICD-10-CM | POA: Diagnosis not present

## 2017-05-26 DIAGNOSIS — N390 Urinary tract infection, site not specified: Secondary | ICD-10-CM | POA: Diagnosis not present

## 2017-05-26 DIAGNOSIS — R112 Nausea with vomiting, unspecified: Secondary | ICD-10-CM | POA: Diagnosis not present

## 2017-05-26 DIAGNOSIS — I10 Essential (primary) hypertension: Secondary | ICD-10-CM | POA: Diagnosis not present

## 2017-05-26 DIAGNOSIS — I5033 Acute on chronic diastolic (congestive) heart failure: Secondary | ICD-10-CM | POA: Diagnosis not present

## 2017-05-26 DIAGNOSIS — I2510 Atherosclerotic heart disease of native coronary artery without angina pectoris: Secondary | ICD-10-CM | POA: Diagnosis not present

## 2017-05-26 DIAGNOSIS — M6281 Muscle weakness (generalized): Secondary | ICD-10-CM | POA: Diagnosis not present

## 2017-05-26 DIAGNOSIS — R079 Chest pain, unspecified: Secondary | ICD-10-CM | POA: Diagnosis not present

## 2017-05-26 DIAGNOSIS — I4510 Unspecified right bundle-branch block: Secondary | ICD-10-CM | POA: Diagnosis not present

## 2017-05-26 DIAGNOSIS — E1149 Type 2 diabetes mellitus with other diabetic neurological complication: Secondary | ICD-10-CM | POA: Diagnosis not present

## 2017-05-27 DIAGNOSIS — M6281 Muscle weakness (generalized): Secondary | ICD-10-CM | POA: Diagnosis not present

## 2017-05-27 DIAGNOSIS — I10 Essential (primary) hypertension: Secondary | ICD-10-CM | POA: Diagnosis not present

## 2017-05-27 DIAGNOSIS — N183 Chronic kidney disease, stage 3 (moderate): Secondary | ICD-10-CM | POA: Diagnosis not present

## 2017-05-27 DIAGNOSIS — I5033 Acute on chronic diastolic (congestive) heart failure: Secondary | ICD-10-CM | POA: Diagnosis not present

## 2017-05-27 DIAGNOSIS — E1149 Type 2 diabetes mellitus with other diabetic neurological complication: Secondary | ICD-10-CM | POA: Diagnosis not present

## 2017-05-27 DIAGNOSIS — I2510 Atherosclerotic heart disease of native coronary artery without angina pectoris: Secondary | ICD-10-CM | POA: Diagnosis not present

## 2017-05-27 DIAGNOSIS — E1143 Type 2 diabetes mellitus with diabetic autonomic (poly)neuropathy: Secondary | ICD-10-CM | POA: Diagnosis not present

## 2017-05-27 DIAGNOSIS — E785 Hyperlipidemia, unspecified: Secondary | ICD-10-CM | POA: Diagnosis not present

## 2017-05-27 DIAGNOSIS — R079 Chest pain, unspecified: Secondary | ICD-10-CM | POA: Diagnosis not present

## 2017-05-27 DIAGNOSIS — N390 Urinary tract infection, site not specified: Secondary | ICD-10-CM | POA: Diagnosis not present

## 2017-05-27 DIAGNOSIS — I4510 Unspecified right bundle-branch block: Secondary | ICD-10-CM | POA: Diagnosis not present

## 2017-05-27 DIAGNOSIS — R112 Nausea with vomiting, unspecified: Secondary | ICD-10-CM | POA: Diagnosis not present

## 2017-05-28 DIAGNOSIS — I2510 Atherosclerotic heart disease of native coronary artery without angina pectoris: Secondary | ICD-10-CM | POA: Diagnosis not present

## 2017-05-28 DIAGNOSIS — M6281 Muscle weakness (generalized): Secondary | ICD-10-CM | POA: Diagnosis not present

## 2017-05-28 DIAGNOSIS — E1143 Type 2 diabetes mellitus with diabetic autonomic (poly)neuropathy: Secondary | ICD-10-CM | POA: Diagnosis not present

## 2017-05-28 DIAGNOSIS — R112 Nausea with vomiting, unspecified: Secondary | ICD-10-CM | POA: Diagnosis not present

## 2017-05-28 DIAGNOSIS — I4510 Unspecified right bundle-branch block: Secondary | ICD-10-CM | POA: Diagnosis not present

## 2017-05-28 DIAGNOSIS — E1149 Type 2 diabetes mellitus with other diabetic neurological complication: Secondary | ICD-10-CM | POA: Diagnosis not present

## 2017-05-28 DIAGNOSIS — N183 Chronic kidney disease, stage 3 (moderate): Secondary | ICD-10-CM | POA: Diagnosis not present

## 2017-05-28 DIAGNOSIS — N390 Urinary tract infection, site not specified: Secondary | ICD-10-CM | POA: Diagnosis not present

## 2017-05-28 DIAGNOSIS — E785 Hyperlipidemia, unspecified: Secondary | ICD-10-CM | POA: Diagnosis not present

## 2017-05-28 DIAGNOSIS — I5033 Acute on chronic diastolic (congestive) heart failure: Secondary | ICD-10-CM | POA: Diagnosis not present

## 2017-05-28 DIAGNOSIS — R079 Chest pain, unspecified: Secondary | ICD-10-CM | POA: Diagnosis not present

## 2017-05-28 DIAGNOSIS — I10 Essential (primary) hypertension: Secondary | ICD-10-CM | POA: Diagnosis not present

## 2017-05-31 DIAGNOSIS — I5033 Acute on chronic diastolic (congestive) heart failure: Secondary | ICD-10-CM | POA: Diagnosis not present

## 2017-05-31 DIAGNOSIS — N183 Chronic kidney disease, stage 3 (moderate): Secondary | ICD-10-CM | POA: Diagnosis not present

## 2017-05-31 DIAGNOSIS — E785 Hyperlipidemia, unspecified: Secondary | ICD-10-CM | POA: Diagnosis not present

## 2017-05-31 DIAGNOSIS — I10 Essential (primary) hypertension: Secondary | ICD-10-CM | POA: Diagnosis not present

## 2017-05-31 DIAGNOSIS — R079 Chest pain, unspecified: Secondary | ICD-10-CM | POA: Diagnosis not present

## 2017-05-31 DIAGNOSIS — E1149 Type 2 diabetes mellitus with other diabetic neurological complication: Secondary | ICD-10-CM | POA: Diagnosis not present

## 2017-05-31 DIAGNOSIS — N390 Urinary tract infection, site not specified: Secondary | ICD-10-CM | POA: Diagnosis not present

## 2017-05-31 DIAGNOSIS — E1143 Type 2 diabetes mellitus with diabetic autonomic (poly)neuropathy: Secondary | ICD-10-CM | POA: Diagnosis not present

## 2017-05-31 DIAGNOSIS — I4510 Unspecified right bundle-branch block: Secondary | ICD-10-CM | POA: Diagnosis not present

## 2017-05-31 DIAGNOSIS — I2510 Atherosclerotic heart disease of native coronary artery without angina pectoris: Secondary | ICD-10-CM | POA: Diagnosis not present

## 2017-05-31 DIAGNOSIS — R112 Nausea with vomiting, unspecified: Secondary | ICD-10-CM | POA: Diagnosis not present

## 2017-05-31 DIAGNOSIS — M6281 Muscle weakness (generalized): Secondary | ICD-10-CM | POA: Diagnosis not present

## 2017-06-01 DIAGNOSIS — I10 Essential (primary) hypertension: Secondary | ICD-10-CM | POA: Diagnosis not present

## 2017-06-01 DIAGNOSIS — I4510 Unspecified right bundle-branch block: Secondary | ICD-10-CM | POA: Diagnosis not present

## 2017-06-01 DIAGNOSIS — R112 Nausea with vomiting, unspecified: Secondary | ICD-10-CM | POA: Diagnosis not present

## 2017-06-01 DIAGNOSIS — I5033 Acute on chronic diastolic (congestive) heart failure: Secondary | ICD-10-CM | POA: Diagnosis not present

## 2017-06-01 DIAGNOSIS — I2510 Atherosclerotic heart disease of native coronary artery without angina pectoris: Secondary | ICD-10-CM | POA: Diagnosis not present

## 2017-06-01 DIAGNOSIS — E1149 Type 2 diabetes mellitus with other diabetic neurological complication: Secondary | ICD-10-CM | POA: Diagnosis not present

## 2017-06-01 DIAGNOSIS — M6281 Muscle weakness (generalized): Secondary | ICD-10-CM | POA: Diagnosis not present

## 2017-06-01 DIAGNOSIS — N390 Urinary tract infection, site not specified: Secondary | ICD-10-CM | POA: Diagnosis not present

## 2017-06-01 DIAGNOSIS — R079 Chest pain, unspecified: Secondary | ICD-10-CM | POA: Diagnosis not present

## 2017-06-01 DIAGNOSIS — E1143 Type 2 diabetes mellitus with diabetic autonomic (poly)neuropathy: Secondary | ICD-10-CM | POA: Diagnosis not present

## 2017-06-01 DIAGNOSIS — N183 Chronic kidney disease, stage 3 (moderate): Secondary | ICD-10-CM | POA: Diagnosis not present

## 2017-06-01 DIAGNOSIS — E785 Hyperlipidemia, unspecified: Secondary | ICD-10-CM | POA: Diagnosis not present

## 2017-06-16 DIAGNOSIS — I5033 Acute on chronic diastolic (congestive) heart failure: Secondary | ICD-10-CM | POA: Diagnosis not present

## 2017-06-16 DIAGNOSIS — I10 Essential (primary) hypertension: Secondary | ICD-10-CM | POA: Diagnosis not present

## 2017-06-20 ENCOUNTER — Ambulatory Visit (HOSPITAL_BASED_OUTPATIENT_CLINIC_OR_DEPARTMENT_OTHER): Payer: Medicare Other | Admitting: Hematology and Oncology

## 2017-06-20 ENCOUNTER — Other Ambulatory Visit (HOSPITAL_BASED_OUTPATIENT_CLINIC_OR_DEPARTMENT_OTHER): Payer: Medicare Other

## 2017-06-20 ENCOUNTER — Ambulatory Visit: Payer: Medicare Other

## 2017-06-20 ENCOUNTER — Telehealth: Payer: Self-pay | Admitting: Hematology and Oncology

## 2017-06-20 DIAGNOSIS — N189 Chronic kidney disease, unspecified: Secondary | ICD-10-CM | POA: Diagnosis not present

## 2017-06-20 DIAGNOSIS — N179 Acute kidney failure, unspecified: Secondary | ICD-10-CM

## 2017-06-20 DIAGNOSIS — D631 Anemia in chronic kidney disease: Secondary | ICD-10-CM

## 2017-06-20 DIAGNOSIS — N184 Chronic kidney disease, stage 4 (severe): Principal | ICD-10-CM

## 2017-06-20 DIAGNOSIS — I10 Essential (primary) hypertension: Secondary | ICD-10-CM | POA: Diagnosis not present

## 2017-06-20 DIAGNOSIS — R630 Anorexia: Secondary | ICD-10-CM | POA: Diagnosis not present

## 2017-06-20 LAB — CBC WITH DIFFERENTIAL/PLATELET
BASO%: 0.5 % (ref 0.0–2.0)
Basophils Absolute: 0.1 10*3/uL (ref 0.0–0.1)
EOS ABS: 0.5 10*3/uL (ref 0.0–0.5)
EOS%: 4.8 % (ref 0.0–7.0)
HCT: 33.9 % — ABNORMAL LOW (ref 34.8–46.6)
HGB: 11.5 g/dL — ABNORMAL LOW (ref 11.6–15.9)
LYMPH%: 19.7 % (ref 14.0–49.7)
MCH: 29.3 pg (ref 25.1–34.0)
MCHC: 33.9 g/dL (ref 31.5–36.0)
MCV: 86.5 fL (ref 79.5–101.0)
MONO#: 0.8 10*3/uL (ref 0.1–0.9)
MONO%: 7.6 % (ref 0.0–14.0)
NEUT%: 67.4 % (ref 38.4–76.8)
NEUTROS ABS: 6.8 10*3/uL — AB (ref 1.5–6.5)
NRBC: 0 % (ref 0–0)
PLATELETS: 192 10*3/uL (ref 145–400)
RBC: 3.92 10*6/uL (ref 3.70–5.45)
RDW: 14.6 % — AB (ref 11.2–14.5)
WBC: 10.1 10*3/uL (ref 3.9–10.3)
lymph#: 2 10*3/uL (ref 0.9–3.3)

## 2017-06-20 NOTE — Telephone Encounter (Signed)
Gave patients daughter AVS and calendar of upcoming December appointments.  °

## 2017-06-22 ENCOUNTER — Encounter: Payer: Self-pay | Admitting: Hematology and Oncology

## 2017-06-22 NOTE — Assessment & Plan Note (Signed)
She has poorly controlled hypertension We discussed the importance of blood pressure management to preserve her kidney function Would defer to he primary care doctor and cardiologist for further management

## 2017-06-22 NOTE — Assessment & Plan Note (Signed)
She has good response to darbepoetin injection with resolution of anemia We will skip injection today and reschedule to a few weeks from now In the future, I plan to space out her appointment to every 6 weeks or so

## 2017-06-22 NOTE — Progress Notes (Signed)
[name] OFFICE PROGRESS NOTE  Patient Care Team: Riccardo Dubin, MD as PCP - General (Internal Medicine)  SUMMARY OF ONCOLOGIC HISTORY:  [name] is here because of severe anemia. The patient have chronic anemia on and off for the past few years. She has undergone extensive evaluation in the past. Early 2015, she was found to have severe anemia with hemoglobin of 6.8 and underwent extensive evaluation and was found to have iron deficiency. She was placed on oral iron supplement which she takes 2 a day for a while. She also received blood transfusion in the past. She did not undergo repeat GI evaluation last year. Her last colonoscopy was in 2009. Recently, she was hospitalized for pneumonia between 09/15/2015 to 09/19/2015. She had acute mental status change and acute renal failure which improved with conservative management She had a low hemoglobin of 6.4 and received blood transfusion in the hospital. She received antibiotic for pneumonia. At present time, she complained of fatigue. She denies recent chest pain on exertion, shortness of breath on minimal exertion, pre-syncopal episodes, or palpitations. She had not noticed any recent bleeding such as epistaxis, hematuria or hematochezia The patient denies over the counter NSAID ingestion. She is on antiplatelets agents. Her last colonoscopy was in 2009. She does not recall having EGD She denies any pica and eats a variety of diet. She never donated blood She also have interesting history of right breast cancer, discovered when she was 89 and premenopausal. From her collection, she may have stage II disease due to regional lymph nodes involvement. She had mastectomy and complete lymph node dissection on the right axilla. She recalled receiving some form of chemotherapy but never received tamoxifen or radiation. In the 90s, she was diagnosed with uterine cancer due to abnormal postmenopausal  bleeding. She had complete hysterectomy but never received adjuvant treatment. On 10/02/2015, she received blood transfusion for severe anemia On 10/09/2015, she started on Aranesp injection The patient has nondisplaced right hip fracture in August and was hospitalized. That was managed conservatively  INTERVAL HISTORY: Please see below for problem oriented charting. She had no recent admission to the hospital since May 2018 She is sedentary and does not do much She denies recent infection Her appetite remains poor The patient denies any recent signs or symptoms of bleeding such as spontaneous epistaxis, hematuria or hematochezia.  REVIEW OF SYSTEMS:   Constitutional: Denies fevers, chills or abnormal weight loss Eyes: Denies blurriness of vision Ears, nose, mouth, throat, and face: Denies mucositis or sore throat Respiratory: Denies cough, dyspnea or wheezes Cardiovascular: Denies palpitation, chest discomfort  Gastrointestinal:  Denies nausea, heartburn or change in bowel habits Skin: Denies abnormal skin rashes Lymphatics: Denies new lymphadenopathy or easy bruising Neurological:Denies numbness, tingling or new weaknesses Behavioral/Psych: Mood is stable, no new changes  All other systems were reviewed with the patient and are negative.  I have reviewed the past medical history, past surgical history, social history and family history with the patient and they are unchanged from previous note.  ALLERGIES:  is allergic to levofloxacin; morphine and related; sulfa antibiotics; and tape.  MEDICATIONS:  Current Outpatient Prescriptions  Medication Sig Dispense Refill  . amLODipine (NORVASC) 10 MG tablet Take 1 tablet (10 mg total) by mouth daily. 30 tablet 5  . aspirin 81 MG tablet Take 81 mg by mouth at bedtime.     Marland Kitchen atorvastatin (LIPITOR) 80 MG tablet Take 1 tablet (80 mg total) by mouth daily. 90 tablet 3  .  Blood Glucose Monitoring Suppl (ACCU-CHEK AVIVA) device Use to check  blood sugar once daily.  Dx: E11.43 1 each 0  . Cyanocobalamin (VITAMIN B-12 CR PO) Take 1 tablet by mouth daily.    Marland Kitchen gabapentin (NEURONTIN) 100 MG capsule Take 2 capsules (200 mg total) by mouth at bedtime. Can increase to 300 mg at bedtime if still having neuropathy. (Patient taking differently: Take 200-300 mg by mouth at bedtime. Take  200mg  daily at bedtime and Can increase to 300 mg at bedtime if still having neuropathy.) 270 capsule 1  . glipiZIDE (GLUCOTROL XL) 5 MG 24 hr tablet TAKE 1 TABLET (5 MG TOTAL) BY MOUTH DAILY WITH BREAKFAST. 30 tablet 5  . glucose blood (ACCU-CHEK AVIVA) test strip Use to check blood sugar once daily.  Dx: E11.43 100 each 3  . hydrochlorothiazide (MICROZIDE) 12.5 MG capsule Take 12.5 mg by mouth daily.    Elmore Guise Devices (ACCU-CHEK SOFTCLIX) lancets Use to check blood sugar once daily.  Dx: E11.43 1 each 0  . levothyroxine (SYNTHROID, LEVOTHROID) 50 MCG tablet TAKE 1 TABLET BY MOUTH EVERY DAY 90 tablet 1  . metoCLOPramide (REGLAN) 5 MG tablet Take 1 tablet (5 mg total) by mouth 3 (three) times daily before meals. 90 tablet 0  . metoprolol succinate (TOPROL-XL) 25 MG 24 hr tablet Take 25 mg by mouth 2 (two) times daily.    . pantoprazole (PROTONIX) 20 MG tablet Take 1 tablet (20 mg total) by mouth 2 (two) times daily. 60 tablet 0  . polyethylene glycol (MIRALAX / GLYCOLAX) packet Take 17 g by mouth daily.    . potassium chloride 20 MEQ TBCR Take 2 tablets (64meq) by mouth for 3 days, then take 1 tablet (24meq) daily. 90 tablet 3  . sertraline (ZOLOFT) 25 MG tablet TAKE 1 TABLET (25 MG TOTAL) BY MOUTH DAILY. 30 tablet 5  . Vitamin D, Ergocalciferol, (DRISDOL) 50000 units CAPS capsule TAKE 1 CAPSULE BY MOUTH EVERY 7 DAYS. 4 capsule 2  . ferrous sulfate 325 (65 FE) MG tablet Take 325 mg by mouth daily with breakfast.    . furosemide (LASIX) 20 MG tablet Take 20 mg by mouth daily as needed.    . nitroGLYCERIN (NITROSTAT) 0.4 MG SL tablet Place 1 tablet (0.4 mg total)  under the tongue every 5 (five) minutes as needed for chest pain (x 3 tabs daily). (Patient not taking: Reported on 06/20/2017) 25 tablet 3   No current facility-administered medications for this visit.     PHYSICAL EXAMINATION: ECOG PERFORMANCE STATUS: 3 - Symptomatic, >50% confined to bed  Vitals:   06/20/17 1255  BP: (!) 164/49  Pulse: (!) 59  Resp: 17  Temp: 98.3 F (36.8 C)  SpO2: 99%   Filed Weights   06/20/17 1255  Weight: 176 lb 3.2 oz (79.9 kg)    GENERAL:alert, no distress and comfortable.  She looks elderly and frail, moderately obese SKIN: skin color, texture, turgor are normal, no rashes or significant lesions EYES: normal, Conjunctiva are pink and non-injected, sclera clear OROPHARYNX:no exudate, no erythema and lips, buccal mucosa, and tongue normal  NECK: supple, thyroid normal size, non-tender, without nodularity LYMPH:  no palpable lymphadenopathy in the cervical, axillary or inguinal LUNGS: clear to auscultation and percussion with normal breathing effort HEART: regular rate & rhythm and no murmurs with moderate bilateral lower extremity edema ABDOMEN:abdomen soft, non-tender and normal bowel sounds Musculoskeletal:no cyanosis of digits and no clubbing  NEURO: alert & oriented x 3 with fluent speech,  no focal motor/sensory deficits  LABORATORY DATA:  I have reviewed the data as listed    Component Value Date/Time   NA 139 04/28/2017 1208   NA 141 06/17/2016 1147   K 2.6 (L) 04/28/2017 1208   K 4.0 06/17/2016 1147   CL 92 (L) 04/28/2017 1208   CO2 26 04/28/2017 1208   CO2 25 06/17/2016 1147   GLUCOSE 162 (H) 04/28/2017 1208   GLUCOSE 147 (H) 02/03/2017 0532   GLUCOSE 163 (H) 06/17/2016 1147   BUN 15 04/28/2017 1208   BUN 25.8 06/17/2016 1147   CREATININE 1.27 (H) 04/28/2017 1208   CREATININE 1.3 (H) 06/17/2016 1147   CALCIUM 9.0 04/28/2017 1208   CALCIUM 9.0 06/17/2016 1147   PROT 6.0 (L) 02/03/2017 0532   PROT 6.2 (L) 06/17/2016 1147   ALBUMIN  3.0 (L) 02/03/2017 0532   ALBUMIN 2.9 (L) 06/17/2016 1147   AST 15 02/03/2017 0532   AST 13 06/17/2016 1147   ALT 12 (L) 02/03/2017 0532   ALT 9 06/17/2016 1147   ALKPHOS 75 02/03/2017 0532   ALKPHOS 119 06/17/2016 1147   BILITOT 0.7 02/03/2017 0532   BILITOT 0.70 06/17/2016 1147   GFRNONAA 40 (L) 04/28/2017 1208   GFRAA 46 (L) 04/28/2017 1208    No results found for: SPEP, UPEP  Lab Results  Component Value Date   WBC 10.1 06/20/2017   NEUTROABS 6.8 (H) 06/20/2017   HGB 11.5 (L) 06/20/2017   HCT 33.9 (L) 06/20/2017   MCV 86.5 06/20/2017   PLT 192 06/20/2017      Chemistry      Component Value Date/Time   NA 139 04/28/2017 1208   NA 141 06/17/2016 1147   K 2.6 (L) 04/28/2017 1208   K 4.0 06/17/2016 1147   CL 92 (L) 04/28/2017 1208   CO2 26 04/28/2017 1208   CO2 25 06/17/2016 1147   BUN 15 04/28/2017 1208   BUN 25.8 06/17/2016 1147   CREATININE 1.27 (H) 04/28/2017 1208   CREATININE 1.3 (H) 06/17/2016 1147      Component Value Date/Time   CALCIUM 9.0 04/28/2017 1208   CALCIUM 9.0 06/17/2016 1147   ALKPHOS 75 02/03/2017 0532   ALKPHOS 119 06/17/2016 1147   AST 15 02/03/2017 0532   AST 13 06/17/2016 1147   ALT 12 (L) 02/03/2017 0532   ALT 9 06/17/2016 1147   BILITOT 0.7 02/03/2017 0532   BILITOT 0.70 06/17/2016 1147      ASSESSMENT & PLAN:  Anemia due to chronic renal failure treated with erythropoietin She has good response to darbepoetin injection with resolution of anemia We will skip injection today and reschedule to a few weeks from now In the future, I plan to space out her appointment to every 6 weeks or so  Essential hypertension She has poorly controlled hypertension We discussed the importance of blood pressure management to preserve her kidney function Would defer to he primary care doctor and cardiologist for further management   No orders of the defined types were placed in this encounter.  All questions were answered. The patient knows to  call the clinic with any problems, questions or concerns. No barriers to learning was detected. I spent 10 minutes counseling the patient face to face. The total time spent in the appointment was 15 minutes and more than 50% was on counseling and review of test results     Heath Lark, MD 06/22/2017 11:15 AM

## 2017-06-23 DIAGNOSIS — D649 Anemia, unspecified: Secondary | ICD-10-CM | POA: Diagnosis not present

## 2017-06-23 DIAGNOSIS — G629 Polyneuropathy, unspecified: Secondary | ICD-10-CM | POA: Diagnosis not present

## 2017-06-23 DIAGNOSIS — N189 Chronic kidney disease, unspecified: Secondary | ICD-10-CM | POA: Diagnosis not present

## 2017-06-27 DIAGNOSIS — Z79899 Other long term (current) drug therapy: Secondary | ICD-10-CM | POA: Diagnosis not present

## 2017-06-27 DIAGNOSIS — E039 Hypothyroidism, unspecified: Secondary | ICD-10-CM | POA: Diagnosis not present

## 2017-06-27 DIAGNOSIS — E119 Type 2 diabetes mellitus without complications: Secondary | ICD-10-CM | POA: Diagnosis not present

## 2017-06-27 DIAGNOSIS — D649 Anemia, unspecified: Secondary | ICD-10-CM | POA: Diagnosis not present

## 2017-06-27 DIAGNOSIS — E785 Hyperlipidemia, unspecified: Secondary | ICD-10-CM | POA: Diagnosis not present

## 2017-07-11 DIAGNOSIS — M6281 Muscle weakness (generalized): Secondary | ICD-10-CM | POA: Diagnosis not present

## 2017-07-12 DIAGNOSIS — E785 Hyperlipidemia, unspecified: Secondary | ICD-10-CM | POA: Diagnosis not present

## 2017-07-12 DIAGNOSIS — I5033 Acute on chronic diastolic (congestive) heart failure: Secondary | ICD-10-CM | POA: Diagnosis not present

## 2017-07-12 DIAGNOSIS — M6281 Muscle weakness (generalized): Secondary | ICD-10-CM | POA: Diagnosis not present

## 2017-07-12 DIAGNOSIS — I10 Essential (primary) hypertension: Secondary | ICD-10-CM | POA: Diagnosis not present

## 2017-07-12 DIAGNOSIS — E1149 Type 2 diabetes mellitus with other diabetic neurological complication: Secondary | ICD-10-CM | POA: Diagnosis not present

## 2017-07-13 DIAGNOSIS — M6281 Muscle weakness (generalized): Secondary | ICD-10-CM | POA: Diagnosis not present

## 2017-07-14 DIAGNOSIS — M6281 Muscle weakness (generalized): Secondary | ICD-10-CM | POA: Diagnosis not present

## 2017-07-15 DIAGNOSIS — B351 Tinea unguium: Secondary | ICD-10-CM | POA: Diagnosis not present

## 2017-07-15 DIAGNOSIS — E1149 Type 2 diabetes mellitus with other diabetic neurological complication: Secondary | ICD-10-CM | POA: Diagnosis not present

## 2017-07-15 DIAGNOSIS — M6281 Muscle weakness (generalized): Secondary | ICD-10-CM | POA: Diagnosis not present

## 2017-07-15 DIAGNOSIS — L84 Corns and callosities: Secondary | ICD-10-CM | POA: Diagnosis not present

## 2017-07-18 DIAGNOSIS — M6281 Muscle weakness (generalized): Secondary | ICD-10-CM | POA: Diagnosis not present

## 2017-07-19 DIAGNOSIS — R319 Hematuria, unspecified: Secondary | ICD-10-CM | POA: Diagnosis not present

## 2017-07-19 DIAGNOSIS — M6281 Muscle weakness (generalized): Secondary | ICD-10-CM | POA: Diagnosis not present

## 2017-07-19 DIAGNOSIS — Z79899 Other long term (current) drug therapy: Secondary | ICD-10-CM | POA: Diagnosis not present

## 2017-07-19 DIAGNOSIS — N390 Urinary tract infection, site not specified: Secondary | ICD-10-CM | POA: Diagnosis not present

## 2017-07-19 DIAGNOSIS — I10 Essential (primary) hypertension: Secondary | ICD-10-CM | POA: Diagnosis not present

## 2017-07-19 DIAGNOSIS — E1022 Type 1 diabetes mellitus with diabetic chronic kidney disease: Secondary | ICD-10-CM | POA: Diagnosis not present

## 2017-07-20 DIAGNOSIS — M6281 Muscle weakness (generalized): Secondary | ICD-10-CM | POA: Diagnosis not present

## 2017-07-21 DIAGNOSIS — I509 Heart failure, unspecified: Secondary | ICD-10-CM | POA: Diagnosis not present

## 2017-07-21 DIAGNOSIS — M6281 Muscle weakness (generalized): Secondary | ICD-10-CM | POA: Diagnosis not present

## 2017-07-21 DIAGNOSIS — N390 Urinary tract infection, site not specified: Secondary | ICD-10-CM | POA: Diagnosis not present

## 2017-07-21 DIAGNOSIS — G629 Polyneuropathy, unspecified: Secondary | ICD-10-CM | POA: Diagnosis not present

## 2017-07-21 DIAGNOSIS — N189 Chronic kidney disease, unspecified: Secondary | ICD-10-CM | POA: Diagnosis not present

## 2017-07-22 DIAGNOSIS — M6281 Muscle weakness (generalized): Secondary | ICD-10-CM | POA: Diagnosis not present

## 2017-07-23 DIAGNOSIS — M6281 Muscle weakness (generalized): Secondary | ICD-10-CM | POA: Diagnosis not present

## 2017-07-24 DIAGNOSIS — M6281 Muscle weakness (generalized): Secondary | ICD-10-CM | POA: Diagnosis not present

## 2017-07-25 DIAGNOSIS — M6281 Muscle weakness (generalized): Secondary | ICD-10-CM | POA: Diagnosis not present

## 2017-07-26 DIAGNOSIS — M6281 Muscle weakness (generalized): Secondary | ICD-10-CM | POA: Diagnosis not present

## 2017-07-27 DIAGNOSIS — M6281 Muscle weakness (generalized): Secondary | ICD-10-CM | POA: Diagnosis not present

## 2017-08-01 ENCOUNTER — Encounter: Payer: Self-pay | Admitting: Cardiovascular Disease

## 2017-08-01 ENCOUNTER — Ambulatory Visit: Payer: Medicare Other | Admitting: Cardiovascular Disease

## 2017-08-01 VITALS — BP 144/66 | HR 58 | Ht 63.0 in | Wt 173.0 lb

## 2017-08-01 DIAGNOSIS — Z79899 Other long term (current) drug therapy: Secondary | ICD-10-CM

## 2017-08-01 DIAGNOSIS — I4510 Unspecified right bundle-branch block: Secondary | ICD-10-CM | POA: Diagnosis not present

## 2017-08-01 DIAGNOSIS — E138 Other specified diabetes mellitus with unspecified complications: Secondary | ICD-10-CM | POA: Diagnosis not present

## 2017-08-01 DIAGNOSIS — I10 Essential (primary) hypertension: Secondary | ICD-10-CM

## 2017-08-01 DIAGNOSIS — I2510 Atherosclerotic heart disease of native coronary artery without angina pectoris: Secondary | ICD-10-CM

## 2017-08-01 DIAGNOSIS — E0840 Diabetes mellitus due to underlying condition with diabetic neuropathy, unspecified: Secondary | ICD-10-CM | POA: Diagnosis not present

## 2017-08-01 DIAGNOSIS — E785 Hyperlipidemia, unspecified: Secondary | ICD-10-CM | POA: Diagnosis not present

## 2017-08-01 DIAGNOSIS — N183 Chronic kidney disease, stage 3 unspecified: Secondary | ICD-10-CM

## 2017-08-01 DIAGNOSIS — I6523 Occlusion and stenosis of bilateral carotid arteries: Secondary | ICD-10-CM

## 2017-08-01 NOTE — Progress Notes (Signed)
Cardiology Office Note    Date:  08/03/2017   ID:  [name], DOB Jul 26, 1933, MRN [id number]  PCP:  No primary care provider on file.  Cardiologist:   Sanda Klein, MD   chief complaint: Establish new cardiology follow-up   History of Present Illness:  [name] is a 83 y.o. female former patient of Dr. Warren Vaughan with a history of coronary artery disease status post coronary bypass surgery, severe mixed hyperlipidemia,  right bundle branch block and first-degree AV block, hypertension, type 2 diabetes mellitus with neuropathy, stage III-IV chronic kidney disease, erythropoietin deficiency related anemia.   She was in the hospital with intractable nausea and vomiting in May.  She had heart failure exacerbation and a urinary tract infection at the time.  She is now a resident at [facility].  She has not had any cardiovascular complaints.  Her biggest complaint is of nausea that occurs about once a week.  The exact trigger is unclear.  She denies problems with orthopnea or PND or dyspnea with the limited activity that she still performs.  She denies angina.  She has not had syncope or falls.  Medication list contains atorvastatin 80 mg once daily she seems to be taking it without the previous complaints of muscle aches.  She has a history of ischemic heart disease. She underwent coronary artery bypass graft surgery following an acute myocardial infarction and surgery was done on 12/22/06. She has not had any subsequent angina pectoris. She does have exertional dyspnea.  Her most recent echocardiogram on 09/17/15 showed an ejection fraction of 60-65% and grade 2 diastolic dysfunction.  There was aortic valve sclerosis without significant stenosis and there was mitral annular calcification.. She had a LexiScan Myoview stress test on 01/25/12 which showed a small inferior wall scar and no ischemia and her ejection fraction was 72%. Carotid duplex ultrasound in December2016  showed 60-79% right, 40-59% left internal carotid artery stenosis    Past Medical History:  Diagnosis Date  . Cellulitis    R ARM  . Coronary artery disease 2008   MI, s/p 5v CABG  . Diabetes mellitus ([place])    10 years ago ~age 15  . History of breast cancer 1979  . Hyperlipidemia   . Hypertension   . Hypothyroidism   . Uterine cancer ([place]) 1991    Past Surgical History:  Procedure Laterality Date  . APPENDECTOMY    . CARDIAC CATHETERIZATION    . CORONARY ARTERY BYPASS GRAFT  2008  . HIP FRACTURE SURGERY Right 2017  . MASTECTOMY  1979   right, followed by 2 years chemo  . NUCLEAR STRESS TEST  07/2009   EF 69%,  MILD ISCHEMIA IN INFEROLATERAL WALL  . TOTAL ABDOMINAL HYSTERECTOMY  1993  . TOTAL HIP ARTHROPLASTY  08/2009   right  . US ECHOCARDIOGRAPHY  05/2009   EF 55-60%,MILD-MOD LVH,, MILD-MOD A STENOSIS,    Current Medications: Outpatient Medications Prior to Visit  Medication Sig Dispense Refill  . amLODipine (NORVASC) 10 MG tablet Take 1 tablet (10 mg total) by mouth daily. 30 tablet 5  . aspirin 81 MG tablet Take 81 mg by mouth at bedtime.     Marland Kitchen atorvastatin (LIPITOR) 80 MG tablet Take 1 tablet (80 mg total) by mouth daily. 90 tablet 3  . Cyanocobalamin (VITAMIN B-12 CR PO) Take 1 tablet by mouth daily.    . ferrous sulfate 325 (65 FE) MG tablet Take 325 mg by mouth daily with breakfast.    .  furosemide (LASIX) 20 MG tablet Take 20 mg by mouth daily as needed.    . gabapentin (NEURONTIN) 100 MG capsule Take 2 capsules (200 mg total) by mouth at bedtime. Can increase to 300 mg at bedtime if still having neuropathy. (Patient taking differently: Take 200-300 mg by mouth at bedtime. Take  200mg  daily at bedtime and Can increase to 300 mg at bedtime if still having neuropathy.) 270 capsule 1  . glipiZIDE (GLUCOTROL XL) 5 MG 24 hr tablet TAKE 1 TABLET (5 MG TOTAL) BY MOUTH DAILY WITH BREAKFAST. 30 tablet 5  . glucose blood (ACCU-CHEK AVIVA) test strip Use to check blood  sugar once daily.  Dx: E11.43 100 each 3  . hydrochlorothiazide (MICROZIDE) 12.5 MG capsule Take 12.5 mg by mouth daily.    Elmore Guise Devices (ACCU-CHEK SOFTCLIX) lancets Use to check blood sugar once daily.  Dx: E11.43 1 each 0  . levothyroxine (SYNTHROID, LEVOTHROID) 50 MCG tablet TAKE 1 TABLET BY MOUTH EVERY DAY 90 tablet 1  . metoCLOPramide (REGLAN) 5 MG tablet Take 1 tablet (5 mg total) by mouth 3 (three) times daily before meals. 90 tablet 0  . metoprolol succinate (TOPROL-XL) 25 MG 24 hr tablet Take 25 mg by mouth 2 (two) times daily.    . nitroGLYCERIN (NITROSTAT) 0.4 MG SL tablet Place 1 tablet (0.4 mg total) under the tongue every 5 (five) minutes as needed for chest pain (x 3 tabs daily). 25 tablet 3  . pantoprazole (PROTONIX) 20 MG tablet Take 1 tablet (20 mg total) by mouth 2 (two) times daily. 60 tablet 0  . polyethylene glycol (MIRALAX / GLYCOLAX) packet Take 17 g by mouth daily.    . potassium chloride 20 MEQ TBCR Take 2 tablets (70meq) by mouth for 3 days, then take 1 tablet (98meq) daily. 90 tablet 3  . sertraline (ZOLOFT) 25 MG tablet TAKE 1 TABLET (25 MG TOTAL) BY MOUTH DAILY. 30 tablet 5  . Vitamin D, Ergocalciferol, (DRISDOL) 50000 units CAPS capsule TAKE 1 CAPSULE BY MOUTH EVERY 7 DAYS. 4 capsule 2  . Blood Glucose Monitoring Suppl (ACCU-CHEK AVIVA) device Use to check blood sugar once daily.  Dx: E11.43 1 each 0   No facility-administered medications prior to visit.      Allergies:   Levofloxacin; Morphine and related; Sulfa antibiotics; and Tape   Social History   Socioeconomic History  . Marital status: Divorced    Spouse name: None  . Number of children: None  . Years of education: None  . Highest education level: None  Social Needs  . Financial resource strain: None  . Food insecurity - worry: None  . Food insecurity - inability: None  . Transportation needs - medical: None  . Transportation needs - non-medical: None  Occupational History  . Occupation:  retired  Tobacco Use  . Smoking status: Never Smoker  . Smokeless tobacco: Never Used  Substance and Sexual Activity  . Alcohol use: No    Alcohol/week: 0.0 oz  . Drug use: No  . Sexual activity: No    Comment: 5 children, 4 daughters and 1 son, retired, active in church  Other Topics Concern  . None  Social History Narrative  . None     Family History:  The patient's family history includes Cancer in her mother; Diabetes in her brother and mother; Heart disease (age of onset: 11) in her father; Throat cancer in her brother.   ROS:   Please see the history of present illness.  ROS All other systems reviewed and are negative.   PHYSICAL EXAM:   VS:  BP (!) 144/66   Pulse (!) 58   Ht 5\' 3"  (1.6 m)   Wt 173 lb (78.5 kg)   BMI 30.65 kg/m    Recheck blood pressure 132/62 General: Alert, oriented x3, no distress, smiling Head: no evidence of trauma, PERRL, EOMI, no exophtalmos or lid lag, no myxedema, no xanthelasma; normal ears, nose and oropharynx Neck: normal jugular venous pulsations and no hepatojugular reflux; brisk carotid pulses without delay and no carotid bruits Chest: clear to auscultation, no signs of consolidation by percussion or palpation, normal fremitus, symmetrical and full respiratory excursions Cardiovascular: normal position and quality of the apical impulse, regular rhythm, normal first and second heart sounds, no murmurs, rubs or gallops Abdomen: no tenderness or distention, no masses by palpation, no abnormal pulsatility or arterial bruits, normal bowel sounds, no hepatosplenomegaly Extremities: no clubbing, cyanosis or edema; 2+ radial, ulnar and brachial pulses bilaterally; 2+ right femoral, posterior tibial and dorsalis pedis pulses; 2+ left femoral, posterior tibial and dorsalis pedis pulses; no subclavian or femoral bruits Neurological: grossly nonfocal Psych: Normal mood and affect   Wt Readings from Last 3 Encounters:  08/01/17 173 lb (78.5 kg)    06/20/17 176 lb 3.2 oz (79.9 kg)  04/28/17 178 lb 3.2 oz (80.8 kg)      Studies/Labs Reviewed:   EKG:  EKG is not ordered today.    Recent Labs: 01/30/2017: B Natriuretic Peptide 588.7 02/03/2017: ALT 12; Magnesium 1.5 03/21/2017: TSH 0.696 04/28/2017: BUN 15; Creatinine, Ser 1.27; Potassium 2.6; Sodium 139 06/20/2017: HGB 11.5; Platelets 192   Lipid Panel    Component Value Date/Time   CHOL 217 (H) 08/26/2016 1053   TRIG 218.0 (H) 08/26/2016 1053   HDL 43.00 08/26/2016 1053   CHOLHDL 5 08/26/2016 1053   VLDL 43.6 (H) 08/26/2016 1053   LDLCALC 140 (H) 09/16/2015 0412   LDLDIRECT 120.0 08/26/2016 1053    Additional studies/ records that were reviewed today include:  Notes from hospital stay  ASSESSMENT:    1. Dyslipidemia   2. Medication management   3. Other specified diabetes mellitus with complication, without long-term current use of insulin (HCC)      PLAN:  In order of problems listed above:  1. CAD s/p CABG 2008: She never had classic angina pectoris, did have some GI manifestations that triggered workup for CAD.  On the other hand her current symptoms are not at all associated with exertion.  She has preserved left ventricular systolic function.  We will hold off any additional cardiac workup at this time. 2. HTN: controlled 3. RBBB: Has not had symptoms of high-grade AV block to date. 4. HLP: Seems to be tolerating the atorvastatin without side effects at this time.  I do not think her nausea is related to this at all.  Do a repeat lipid profile. 5. DM: Lipid profile was unfavorable with elevated triglycerides even in the setting of well-controlled glycemia.  Time for recheck. 6. CKD: Kidney function is better than previous baseline on recent labs. 7. Bilateral carotid stenosis: Favorable findings on most recent duplex ultrasound performed in August 2018    Medication Adjustments/Labs and Tests Ordered: Current medicines are reviewed at length with the patient  today.  Concerns regarding medicines are outlined above.  Medication changes, Labs and Tests ordered today are listed in the Patient Instructions below. Patient Instructions  Dr Sallyanne Kuster recommends that you schedule a follow-up appointment  in 12 months. You will receive a reminder letter in the mail two months in advance. If you don't receive a letter, please call our office to schedule the follow-up appointment.  If you need a refill on your cardiac medications before your next appointment, please call your pharmacy.  Your physician recommends that you return for lab work at your convenience.    Signed, Sanda Klein, MD  08/03/2017 8:15 AM    [facility] [address], [place], [place]  [address] Phone: [phone]; Fax: [phone]

## 2017-08-01 NOTE — Patient Instructions (Signed)
Dr Sallyanne Kuster recommends that you schedule a follow-up appointment in 12 months. You will receive a reminder letter in the mail two months in advance. If you don't receive a letter, please call our office to schedule the follow-up appointment.  If you need a refill on your cardiac medications before your next appointment, please call your pharmacy.  Your physician recommends that you return for lab work at your convenience.

## 2017-08-29 ENCOUNTER — Other Ambulatory Visit: Payer: Self-pay | Admitting: Nurse Practitioner

## 2017-09-12 ENCOUNTER — Other Ambulatory Visit: Payer: Medicare Other

## 2017-09-12 ENCOUNTER — Ambulatory Visit: Payer: Medicare Other | Admitting: Hematology and Oncology

## 2017-09-12 ENCOUNTER — Ambulatory Visit: Payer: Medicare Other

## 2017-09-12 ENCOUNTER — Other Ambulatory Visit: Payer: Medicaid Other

## 2017-09-12 ENCOUNTER — Ambulatory Visit (HOSPITAL_BASED_OUTPATIENT_CLINIC_OR_DEPARTMENT_OTHER): Payer: Medicare Other | Admitting: Hematology and Oncology

## 2017-09-12 ENCOUNTER — Ambulatory Visit (HOSPITAL_BASED_OUTPATIENT_CLINIC_OR_DEPARTMENT_OTHER): Payer: Medicare Other

## 2017-09-12 ENCOUNTER — Other Ambulatory Visit: Payer: Self-pay | Admitting: Hematology and Oncology

## 2017-09-12 DIAGNOSIS — R531 Weakness: Secondary | ICD-10-CM | POA: Diagnosis not present

## 2017-09-12 DIAGNOSIS — N184 Chronic kidney disease, stage 4 (severe): Principal | ICD-10-CM

## 2017-09-12 DIAGNOSIS — N189 Chronic kidney disease, unspecified: Secondary | ICD-10-CM

## 2017-09-12 DIAGNOSIS — D631 Anemia in chronic kidney disease: Secondary | ICD-10-CM

## 2017-09-12 DIAGNOSIS — N179 Acute kidney failure, unspecified: Secondary | ICD-10-CM

## 2017-09-12 DIAGNOSIS — Z7189 Other specified counseling: Secondary | ICD-10-CM

## 2017-09-12 MED ORDER — DARBEPOETIN ALFA 200 MCG/0.4ML IJ SOSY
200.0000 ug | PREFILLED_SYRINGE | Freq: Once | INTRAMUSCULAR | Status: AC
  Administered 2017-09-12: 200 ug via SUBCUTANEOUS

## 2017-09-12 NOTE — Progress Notes (Signed)
Hgb today is 9.8. Lab obtained from type and hold

## 2017-09-12 NOTE — Patient Instructions (Signed)

## 2017-09-13 ENCOUNTER — Encounter: Payer: Self-pay | Admitting: Hematology and Oncology

## 2017-09-13 DIAGNOSIS — Z7189 Other specified counseling: Secondary | ICD-10-CM | POA: Insufficient documentation

## 2017-09-13 NOTE — Progress Notes (Signed)
[facility] OFFICE PROGRESS NOTE  Patient Care Team: Patient, No Pcp Per as PCP - General (General Practice)  SUMMARY OF ONCOLOGIC HISTORY:  [name] is here because of severe anemia. The patient have chronic anemia on and off for the past few years. She has undergone extensive evaluation in the past. Early 2015, she was found to have severe anemia with hemoglobin of 6.8 and underwent extensive evaluation and was found to have iron deficiency. She was placed on oral iron supplement which she takes 2 a day for a while. She also received blood transfusion in the past. She did not undergo repeat GI evaluation last year. Her last colonoscopy was in 2009. Recently, she was hospitalized for pneumonia between 09/15/2015 to 09/19/2015. She had acute mental status change and acute renal failure which improved with conservative management She had a low hemoglobin of 6.4 and received blood transfusion in the hospital. She received antibiotic for pneumonia. At present time, she complained of fatigue. She denies recent chest pain on exertion, shortness of breath on minimal exertion, pre-syncopal episodes, or palpitations. She had not noticed any recent bleeding such as epistaxis, hematuria or hematochezia The patient denies over the counter NSAID ingestion. She is on antiplatelets agents. Her last colonoscopy was in 2009. She does not recall having EGD She denies any pica and eats a variety of diet. She never donated blood She also have interesting history of right breast cancer, discovered when she was 70 and premenopausal. From her collection, she may have stage II disease due to regional lymph nodes involvement. She had mastectomy and complete lymph node dissection on the right axilla. She recalled receiving some form of chemotherapy but never received tamoxifen or radiation. In the 90s, she was diagnosed with uterine cancer due to abnormal postmenopausal bleeding. She had  complete hysterectomy but never received adjuvant treatment. On 10/02/2015, she received blood transfusion for severe anemia On 10/09/2015, she started on Aranesp injection The patient has nondisplaced right hip fracture in August and was hospitalized. That was managed conservatively  INTERVAL HISTORY: Please see below for problem oriented charting. She is here with multiple family members to discuss plan of care Since the last time I saw her, she got progressively weaker She had poor appetite and minimum oral intake She gets dehydrated frequently She is not able to get up without assistance The patient denies chest pain or shortness of breath She was recently treated with antibiotics for urinary tract infection The patient denies any recent signs or symptoms of bleeding such as spontaneous epistaxis, hematuria or hematochezia. Family members are keen to discontinue future treatment and focus on symptom management/palliative care only  REVIEW OF SYSTEMS:  All other systems were reviewed with the patient and are negative.  I have reviewed the past medical history, past surgical history, social history and family history with the patient and they are unchanged from previous note.  ALLERGIES:  is allergic to levofloxacin; morphine and related; sulfa antibiotics; and tape.  MEDICATIONS:  Current Outpatient Medications  Medication Sig Dispense Refill  . amLODipine (NORVASC) 10 MG tablet Take 1 tablet (10 mg total) by mouth daily. 30 tablet 5  . aspirin 81 MG tablet Take 81 mg by mouth at bedtime.     Marland Kitchen atorvastatin (LIPITOR) 80 MG tablet Take 1 tablet (80 mg total) by mouth daily. 90 tablet 3  . Cyanocobalamin (VITAMIN B-12 CR PO) Take 1 tablet by mouth daily.    . ferrous sulfate 325 (65 FE)  MG tablet Take 325 mg by mouth daily with breakfast.    . furosemide (LASIX) 20 MG tablet Take 20 mg by mouth daily as needed.    . gabapentin (NEURONTIN) 100 MG capsule Take 2 capsules (200 mg total)  by mouth at bedtime. Can increase to 300 mg at bedtime if still having neuropathy. (Patient taking differently: Take 200-300 mg by mouth at bedtime. Take  200mg  daily at bedtime and Can increase to 300 mg at bedtime if still having neuropathy.) 270 capsule 1  . glipiZIDE (GLUCOTROL XL) 5 MG 24 hr tablet TAKE 1 TABLET (5 MG TOTAL) BY MOUTH DAILY WITH BREAKFAST. 30 tablet 5  . glucose blood (ACCU-CHEK AVIVA) test strip Use to check blood sugar once daily.  Dx: E11.43 100 each 3  . hydrochlorothiazide (MICROZIDE) 12.5 MG capsule Take 12.5 mg by mouth daily.    Elmore Guise Devices (ACCU-CHEK SOFTCLIX) lancets Use to check blood sugar once daily.  Dx: E11.43 1 each 0  . levothyroxine (SYNTHROID, LEVOTHROID) 50 MCG tablet TAKE 1 TABLET BY MOUTH EVERY DAY 90 tablet 1  . metoCLOPramide (REGLAN) 5 MG tablet Take 1 tablet (5 mg total) by mouth 3 (three) times daily before meals. 90 tablet 0  . metoprolol succinate (TOPROL-XL) 25 MG 24 hr tablet Take 25 mg by mouth 2 (two) times daily.    . nitroGLYCERIN (NITROSTAT) 0.4 MG SL tablet Place 1 tablet (0.4 mg total) under the tongue every 5 (five) minutes as needed for chest pain (x 3 tabs daily). 25 tablet 3  . pantoprazole (PROTONIX) 20 MG tablet Take 1 tablet (20 mg total) by mouth 2 (two) times daily. 60 tablet 0  . polyethylene glycol (MIRALAX / GLYCOLAX) packet Take 17 g by mouth daily.    . potassium chloride 20 MEQ TBCR Take 2 tablets (20meq) by mouth for 3 days, then take 1 tablet (66meq) daily. 90 tablet 3  . sertraline (ZOLOFT) 25 MG tablet TAKE 1 TABLET (25 MG TOTAL) BY MOUTH DAILY. 30 tablet 5  . Vitamin D, Ergocalciferol, (DRISDOL) 50000 units CAPS capsule TAKE 1 CAPSULE BY MOUTH EVERY 7 DAYS. 4 capsule 2   No current facility-administered medications for this visit.     PHYSICAL EXAMINATION: ECOG PERFORMANCE STATUS: 4 - Bedbound  Vitals:   09/12/17 1122  BP: (!) 156/57  Pulse: (!) 57  Resp: 17  Temp: 98.3 F (36.8 C)  SpO2: 99%   Filed  Weights   09/12/17 1122  Weight: 166 lb 9.6 oz (75.6 kg)    GENERAL:alert, no distress and comfortable.  She looks pale.  She appears debilitated SKIN: skin color, texture, turgor are normal, no rashes or significant lesions EYES: normal, Conjunctiva are pink and non-injected, sclera clear Musculoskeletal:no cyanosis of digits and no clubbing  NEURO: alert & oriented x 3 with fluent speech, no focal motor/sensory deficits  LABORATORY DATA:  I have reviewed the data as listed    Component Value Date/Time   NA 139 04/28/2017 1208   NA 141 06/17/2016 1147   K 2.6 (L) 04/28/2017 1208   K 4.0 06/17/2016 1147   CL 92 (L) 04/28/2017 1208   CO2 26 04/28/2017 1208   CO2 25 06/17/2016 1147   GLUCOSE 162 (H) 04/28/2017 1208   GLUCOSE 147 (H) 02/03/2017 0532   GLUCOSE 163 (H) 06/17/2016 1147   BUN 15 04/28/2017 1208   BUN 25.8 06/17/2016 1147   CREATININE 1.27 (H) 04/28/2017 1208   CREATININE 1.3 (H) 06/17/2016 1147  CALCIUM 9.0 04/28/2017 1208   CALCIUM 9.0 06/17/2016 1147   PROT 6.0 (L) 02/03/2017 0532   PROT 6.2 (L) 06/17/2016 1147   ALBUMIN 3.0 (L) 02/03/2017 0532   ALBUMIN 2.9 (L) 06/17/2016 1147   AST 15 02/03/2017 0532   AST 13 06/17/2016 1147   ALT 12 (L) 02/03/2017 0532   ALT 9 06/17/2016 1147   ALKPHOS 75 02/03/2017 0532   ALKPHOS 119 06/17/2016 1147   BILITOT 0.7 02/03/2017 0532   BILITOT 0.70 06/17/2016 1147   GFRNONAA 40 (L) 04/28/2017 1208   GFRAA 46 (L) 04/28/2017 1208    No results found for: SPEP, UPEP  Lab Results  Component Value Date   WBC 10.1 06/20/2017   NEUTROABS 6.8 (H) 06/20/2017   HGB 11.5 (L) 06/20/2017   HCT 33.9 (L) 06/20/2017   MCV 86.5 06/20/2017   PLT 192 06/20/2017      Chemistry      Component Value Date/Time   NA 139 04/28/2017 1208   NA 141 06/17/2016 1147   K 2.6 (L) 04/28/2017 1208   K 4.0 06/17/2016 1147   CL 92 (L) 04/28/2017 1208   CO2 26 04/28/2017 1208   CO2 25 06/17/2016 1147   BUN 15 04/28/2017 1208   BUN 25.8  06/17/2016 1147   CREATININE 1.27 (H) 04/28/2017 1208   CREATININE 1.3 (H) 06/17/2016 1147      Component Value Date/Time   CALCIUM 9.0 04/28/2017 1208   CALCIUM 9.0 06/17/2016 1147   ALKPHOS 75 02/03/2017 0532   ALKPHOS 119 06/17/2016 1147   AST 15 02/03/2017 0532   AST 13 06/17/2016 1147   ALT 12 (L) 02/03/2017 0532   ALT 9 06/17/2016 1147   BILITOT 0.7 02/03/2017 0532   BILITOT 0.70 06/17/2016 1147      ASSESSMENT & PLAN:  Anemia due to chronic renal failure treated with erythropoietin The patient is debilitated Since she is here, I recommend we proceed with her last dose of darbepoetin injection I do not plan to bring her back in the future for blood count check of further treatment  Goals of care, counseling/discussion She is very debilitated She is almost completely bedbound due to deconditioning and other medical comorbidities Family members are having difficulties bring her here for future appointment We have extensive discussion about goals of care, risk and benefits of further medical intervention and CODE STATUS Ultimately, the patient and family members appreciate candid discussion about her future prognosis and what to expect in the long-term I recommend palliative care/hospice referral   No orders of the defined types were placed in this encounter.  All questions were answered. The patient knows to call the clinic with any problems, questions or concerns. No barriers to learning was detected. I spent 20 minutes counseling the patient face to face. The total time spent in the appointment was 25 minutes and more than 50% was on counseling and review of test results     Heath Lark, MD 09/13/2017 5:36 PM

## 2017-09-13 NOTE — Assessment & Plan Note (Signed)
The patient is debilitated Since she is here, I recommend we proceed with her last dose of darbepoetin injection I do not plan to bring her back in the future for blood count check of further treatment

## 2017-09-13 NOTE — Assessment & Plan Note (Signed)
She is very debilitated She is almost completely bedbound due to deconditioning and other medical comorbidities Family members are having difficulties bring her here for future appointment We have extensive discussion about goals of care, risk and benefits of further medical intervention and CODE STATUS Ultimately, the patient and family members appreciate candid discussion about her future prognosis and what to expect in the long-term I recommend palliative care/hospice referral

## 2017-09-14 ENCOUNTER — Telehealth: Payer: Self-pay | Admitting: Hematology and Oncology

## 2017-09-14 NOTE — Telephone Encounter (Signed)
Per 12/17 los - Return for No new orders or return visit.

## 2017-11-09 IMAGING — CT CT HEAD W/O CM
1 series · 15 of 30 positions shown, 19 images · non-contrast
Comparison: 05/02/2008.

CLINICAL DATA: 82-year-old hypertensive female recently discharged
from hospital for treatment of pneumonia. Presenting with aphasia
and confusion. Initial encounter.

EXAM:
CT HEAD WITHOUT CONTRAST
TECHNIQUE: Contiguous axial images were obtained from the base of the skull
through the vertex without intravenous contrast.

[Series 2: head 5.0 h30s · axial · 0.45mm/px · z∈[-118,+17]mm · 15 of 31 slices shown, 19 images]
[im 2/31  brain]
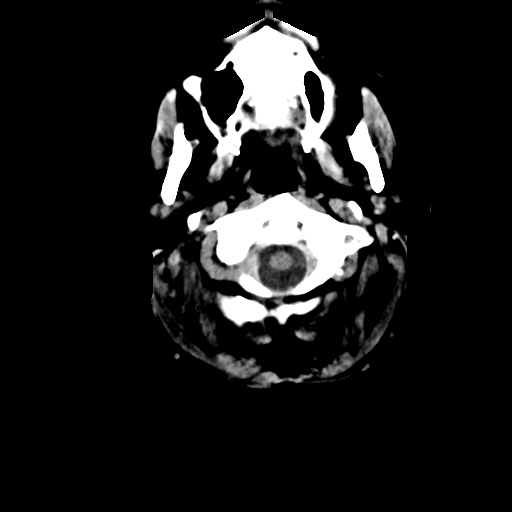
[im 2/31  bone]
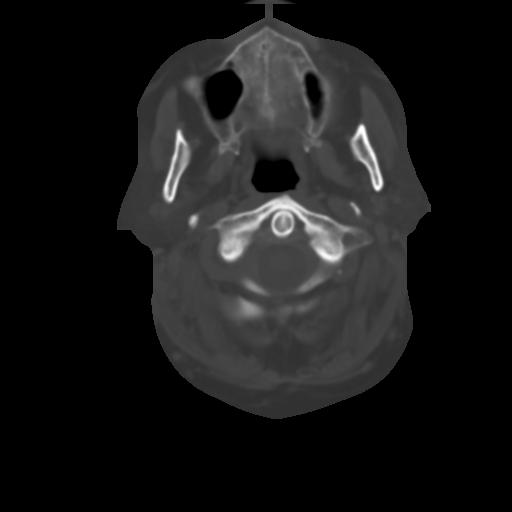
[im 4/31  brain]
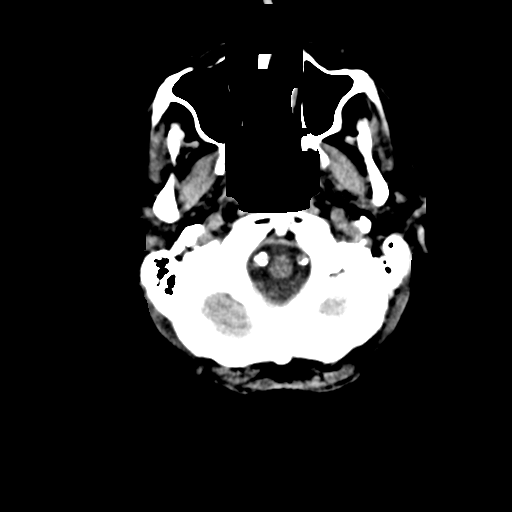
[im 6/31  brain]
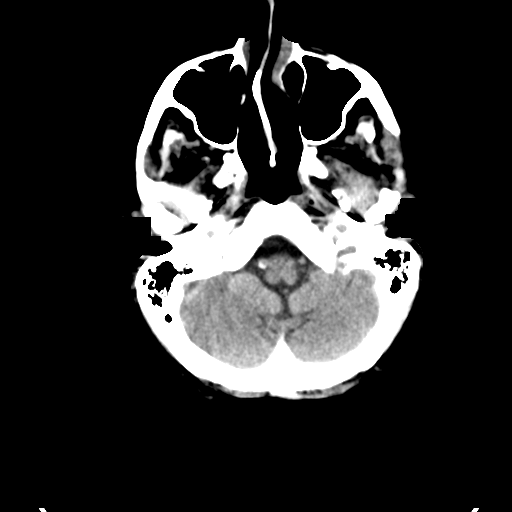
[im 8/31  brain]
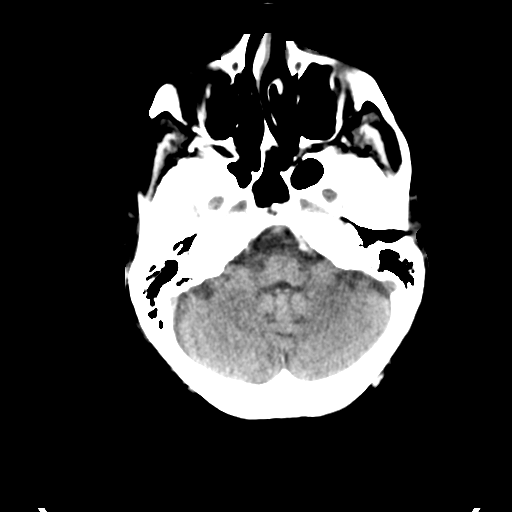
[im 10/31  brain]
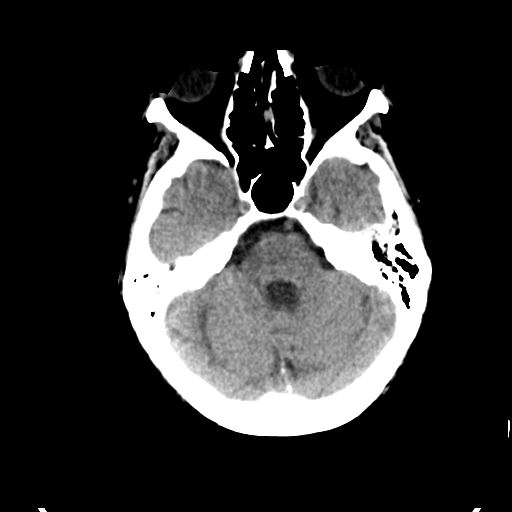
[im 10/31  bone]
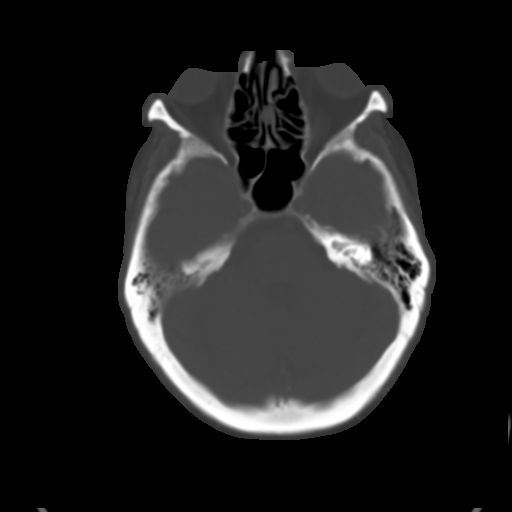
[im 12/31  brain]
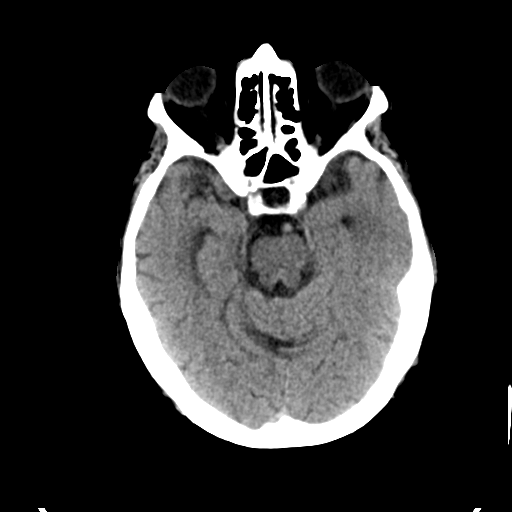
[im 14/31  brain]
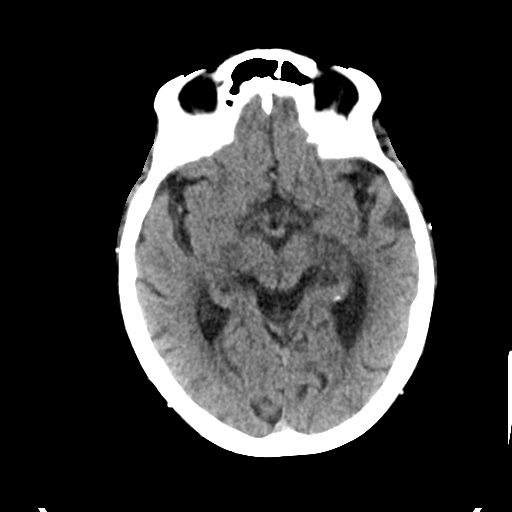
[im 16/31  brain]
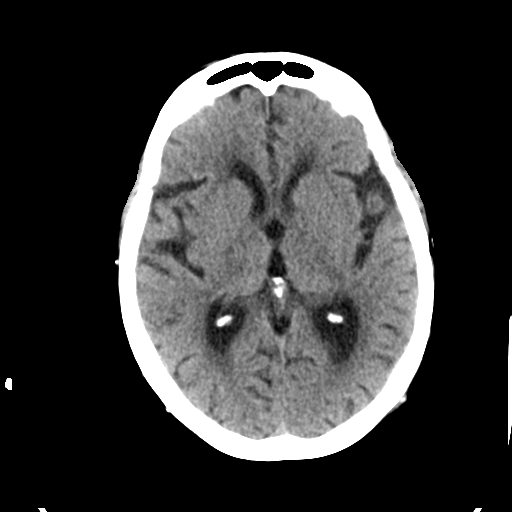
[im 17/31  brain]
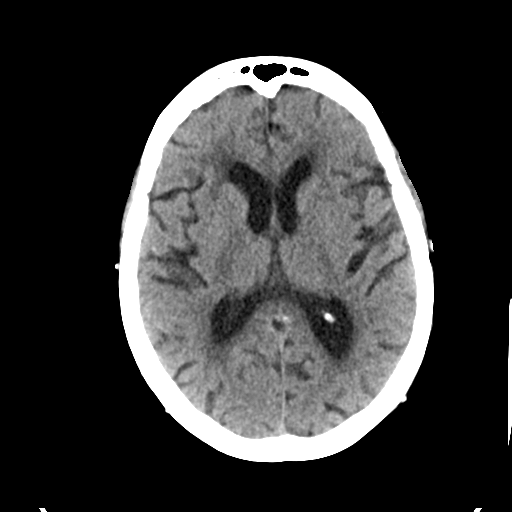
[im 17/31  bone]
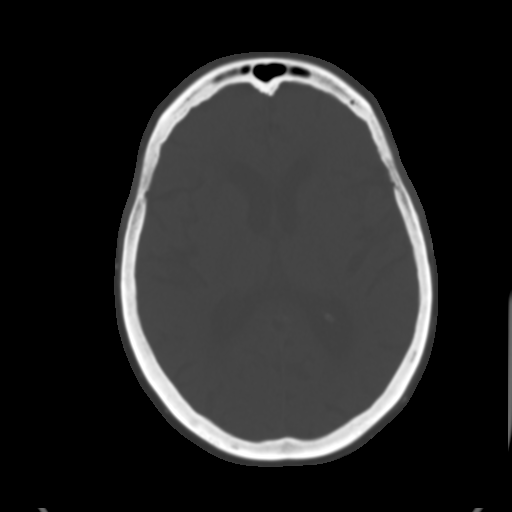
[im 19/31  brain]
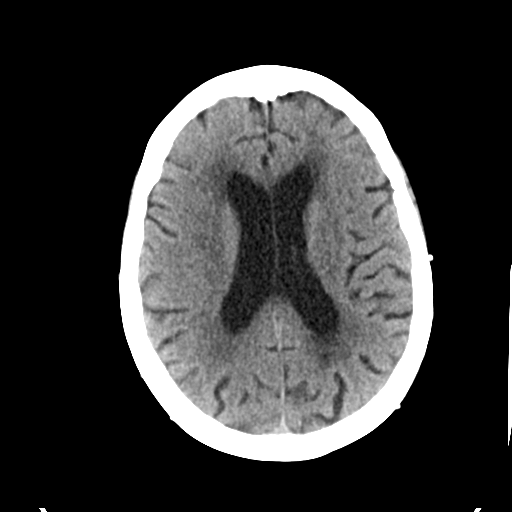
[im 21/31  brain]
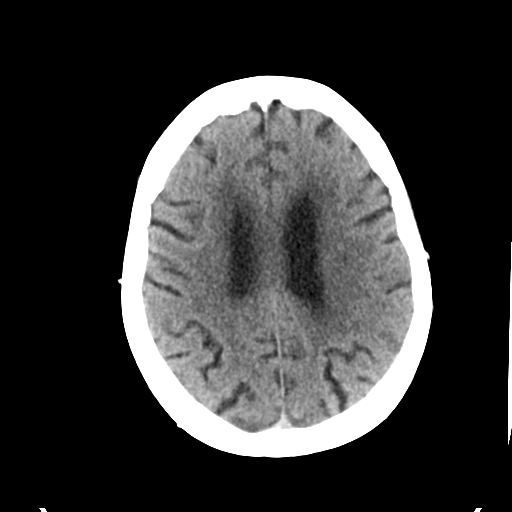
[im 23/31  brain]
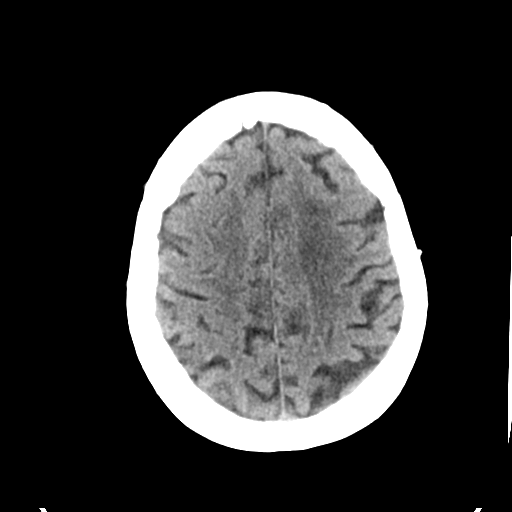
[im 25/31  brain]
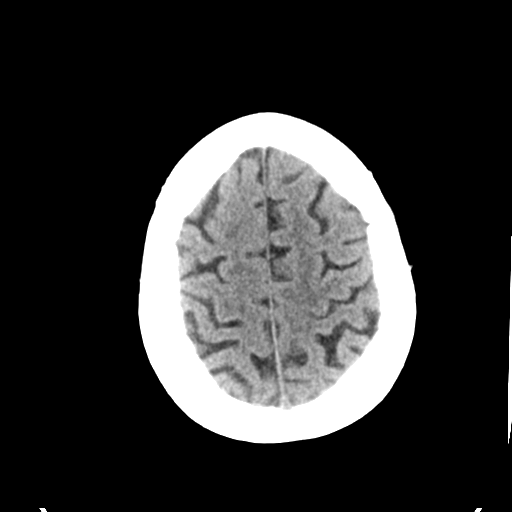
[im 25/31  bone]
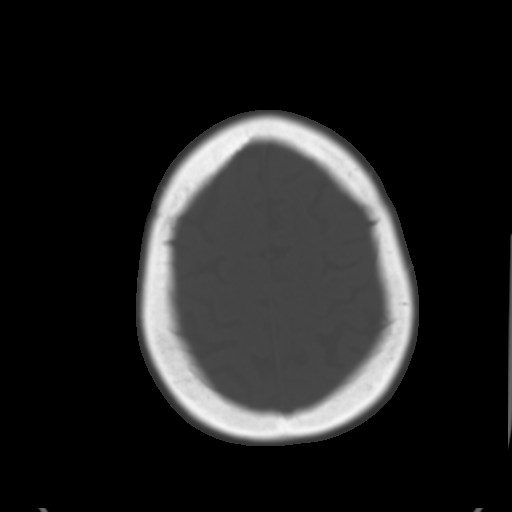
[im 27/31  brain]
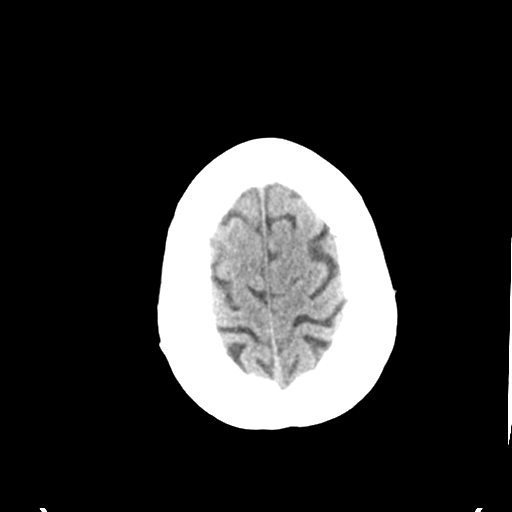
[im 29/31  brain]
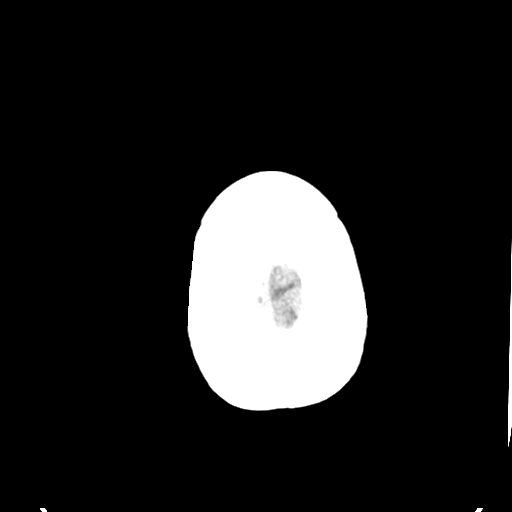

[15 of 30 positions shown; findings below may reference images not displayed]

FINDINGS: Slightly dense appearance of the basilar artery/basilar tip which
appears different than the prior CT and in the proper clinical
setting, thrombosis could not be excluded. Alternatively, this may
be related to atherosclerotic changes.

Otherwise no CT evidence of large acute infarct.

Moderate small vessel disease type changes.

No intracranial hemorrhage.

Global atrophy without hydrocephalus.

No intracranial mass lesion noted on this unenhanced exam.

Vascular calcifications.

Mastoid air cells, middle ear cavities and visualized paranasal
sinuses are clear.

Post lens replacement.  Exophthalmos.
IMPRESSION: Slightly dense appearance of the basilar artery/basilar tip which
appears different than the prior CT and in the proper clinical
setting, thrombosis could not be excluded. Alternatively, this may
be related to atherosclerotic changes.

Otherwise no CT evidence of large acute infarct.

Moderate small vessel disease type changes.

No intracranial hemorrhage.

Global atrophy without hydrocephalus.

## 2018-06-27 DEATH — deceased
